# Patient Record
Sex: Female | Born: 1950 | Race: White | Hispanic: No | Marital: Single | Smoking: Never smoker
Health system: Southern US, Community
[De-identification: ages and names within clinical notes are randomized; demographics above are authoritative.]

## PROBLEM LIST (undated history)

## (undated) DIAGNOSIS — G5600 Carpal tunnel syndrome, unspecified upper limb: Secondary | ICD-10-CM

## (undated) DIAGNOSIS — K5792 Diverticulitis of intestine, part unspecified, without perforation or abscess without bleeding: Secondary | ICD-10-CM

## (undated) DIAGNOSIS — M1990 Unspecified osteoarthritis, unspecified site: Secondary | ICD-10-CM

## (undated) DIAGNOSIS — I10 Essential (primary) hypertension: Secondary | ICD-10-CM

## (undated) DIAGNOSIS — Z8481 Family history of carrier of genetic disease: Secondary | ICD-10-CM

## (undated) DIAGNOSIS — I82419 Acute embolism and thrombosis of unspecified femoral vein: Secondary | ICD-10-CM

## (undated) DIAGNOSIS — G4733 Obstructive sleep apnea (adult) (pediatric): Secondary | ICD-10-CM

## (undated) DIAGNOSIS — M8580 Other specified disorders of bone density and structure, unspecified site: Secondary | ICD-10-CM

## (undated) DIAGNOSIS — C801 Malignant (primary) neoplasm, unspecified: Secondary | ICD-10-CM

## (undated) DIAGNOSIS — Z803 Family history of malignant neoplasm of breast: Secondary | ICD-10-CM

## (undated) DIAGNOSIS — D649 Anemia, unspecified: Secondary | ICD-10-CM

## (undated) DIAGNOSIS — C569 Malignant neoplasm of unspecified ovary: Secondary | ICD-10-CM

## (undated) DIAGNOSIS — H409 Unspecified glaucoma: Secondary | ICD-10-CM

## (undated) DIAGNOSIS — G4730 Sleep apnea, unspecified: Secondary | ICD-10-CM

## (undated) DIAGNOSIS — M7072 Other bursitis of hip, left hip: Secondary | ICD-10-CM

## (undated) DIAGNOSIS — Z1379 Encounter for other screening for genetic and chromosomal anomalies: Secondary | ICD-10-CM

## (undated) DIAGNOSIS — M160 Bilateral primary osteoarthritis of hip: Secondary | ICD-10-CM

## (undated) DIAGNOSIS — J40 Bronchitis, not specified as acute or chronic: Secondary | ICD-10-CM

## (undated) DIAGNOSIS — J189 Pneumonia, unspecified organism: Secondary | ICD-10-CM

## (undated) DIAGNOSIS — O2230 Deep phlebothrombosis in pregnancy, unspecified trimester: Secondary | ICD-10-CM

## (undated) DIAGNOSIS — Z8543 Personal history of malignant neoplasm of ovary: Secondary | ICD-10-CM

## (undated) DIAGNOSIS — B029 Zoster without complications: Secondary | ICD-10-CM

## (undated) DIAGNOSIS — E785 Hyperlipidemia, unspecified: Secondary | ICD-10-CM

## (undated) DIAGNOSIS — H269 Unspecified cataract: Secondary | ICD-10-CM

## (undated) DIAGNOSIS — C44311 Basal cell carcinoma of skin of nose: Secondary | ICD-10-CM

## (undated) HISTORY — PX: OTHER SURGICAL HISTORY: SHX169

## (undated) HISTORY — DX: Sleep apnea, unspecified: G47.30

## (undated) HISTORY — DX: Unspecified glaucoma: H40.9

## (undated) HISTORY — DX: Malignant (primary) neoplasm, unspecified: C80.1

## (undated) HISTORY — PX: EYE SURGERY: SHX253

## (undated) HISTORY — DX: Family history of carrier of genetic disease: Z84.81

## (undated) HISTORY — PX: BASAL CELL CARCINOMA EXCISION: SHX1214

## (undated) HISTORY — DX: Zoster without complications: B02.9

## (undated) HISTORY — DX: Other specified disorders of bone density and structure, unspecified site: M85.80

## (undated) HISTORY — DX: Unspecified cataract: H26.9

## (undated) HISTORY — PX: CATARACT EXTRACTION: SUR2

## (undated) HISTORY — DX: Personal history of malignant neoplasm of ovary: Z85.43

## (undated) HISTORY — DX: Acute embolism and thrombosis of unspecified femoral vein: I82.419

## (undated) HISTORY — PX: ABDOMINAL HYSTERECTOMY: SHX81

## (undated) HISTORY — DX: Basal cell carcinoma of skin of nose: C44.311

## (undated) HISTORY — DX: Family history of malignant neoplasm of breast: Z80.3

## (undated) HISTORY — DX: Essential (primary) hypertension: I10

## (undated) HISTORY — DX: Deep phlebothrombosis in pregnancy, unspecified trimester: O22.30

## (undated) HISTORY — DX: Anemia, unspecified: D64.9

## (undated) HISTORY — DX: Bilateral primary osteoarthritis of hip: M16.0

## (undated) HISTORY — DX: Carpal tunnel syndrome, unspecified upper limb: G56.00

## (undated) HISTORY — DX: Encounter for other screening for genetic and chromosomal anomalies: Z13.79

## (undated) HISTORY — DX: Obstructive sleep apnea (adult) (pediatric): G47.33

## (undated) HISTORY — DX: Malignant neoplasm of unspecified ovary: C56.9

## (undated) HISTORY — DX: Unspecified osteoarthritis, unspecified site: M19.90

## (undated) HISTORY — DX: Bronchitis, not specified as acute or chronic: J40

## (undated) HISTORY — DX: Hyperlipidemia, unspecified: E78.5

## (undated) HISTORY — DX: Other bursitis of hip, left hip: M70.72

---

## 1968-01-12 HISTORY — PX: OTHER SURGICAL HISTORY: SHX169

## 1968-01-12 HISTORY — PX: BREAST LUMPECTOMY: SHX2

## 2000-05-19 ENCOUNTER — Ambulatory Visit (HOSPITAL_COMMUNITY): Admission: RE | Admit: 2000-05-19 | Discharge: 2000-05-19 | Payer: Self-pay | Admitting: Gastroenterology

## 2000-05-19 ENCOUNTER — Encounter (INDEPENDENT_AMBULATORY_CARE_PROVIDER_SITE_OTHER): Payer: Self-pay | Admitting: Specialist

## 2003-01-12 DIAGNOSIS — C569 Malignant neoplasm of unspecified ovary: Secondary | ICD-10-CM

## 2003-01-12 HISTORY — DX: Malignant neoplasm of unspecified ovary: C56.9

## 2003-01-12 HISTORY — PX: TOTAL ABDOMINAL HYSTERECTOMY W/ BILATERAL SALPINGOOPHORECTOMY: SHX83

## 2003-05-10 ENCOUNTER — Encounter (INDEPENDENT_AMBULATORY_CARE_PROVIDER_SITE_OTHER): Payer: Self-pay | Admitting: Specialist

## 2003-05-10 ENCOUNTER — Ambulatory Visit (HOSPITAL_COMMUNITY): Admission: RE | Admit: 2003-05-10 | Discharge: 2003-05-10 | Payer: Self-pay | Admitting: Gastroenterology

## 2003-05-13 ENCOUNTER — Encounter (INDEPENDENT_AMBULATORY_CARE_PROVIDER_SITE_OTHER): Payer: Self-pay | Admitting: *Deleted

## 2003-05-13 ENCOUNTER — Inpatient Hospital Stay (HOSPITAL_COMMUNITY): Admission: RE | Admit: 2003-05-13 | Discharge: 2003-05-17 | Payer: Self-pay | Admitting: *Deleted

## 2003-05-13 ENCOUNTER — Encounter (INDEPENDENT_AMBULATORY_CARE_PROVIDER_SITE_OTHER): Payer: Self-pay | Admitting: Specialist

## 2003-06-12 ENCOUNTER — Ambulatory Visit (HOSPITAL_COMMUNITY): Admission: RE | Admit: 2003-06-12 | Discharge: 2003-06-12 | Payer: Self-pay | Admitting: Oncology

## 2003-12-25 ENCOUNTER — Encounter: Admission: RE | Admit: 2003-12-25 | Discharge: 2003-12-25 | Payer: Self-pay | Admitting: Oncology

## 2004-01-10 ENCOUNTER — Encounter: Admission: RE | Admit: 2004-01-10 | Discharge: 2004-01-10 | Payer: Self-pay | Admitting: Oncology

## 2004-03-17 ENCOUNTER — Ambulatory Visit: Payer: Self-pay | Admitting: Oncology

## 2004-06-23 ENCOUNTER — Ambulatory Visit (HOSPITAL_COMMUNITY): Admission: RE | Admit: 2004-06-23 | Discharge: 2004-06-23 | Payer: Self-pay | Admitting: Oncology

## 2004-09-29 ENCOUNTER — Ambulatory Visit: Payer: Self-pay | Admitting: Oncology

## 2004-12-25 ENCOUNTER — Encounter: Admission: RE | Admit: 2004-12-25 | Discharge: 2004-12-25 | Payer: Self-pay | Admitting: *Deleted

## 2005-03-17 ENCOUNTER — Encounter: Admission: RE | Admit: 2005-03-17 | Discharge: 2005-03-17 | Payer: Self-pay | Admitting: *Deleted

## 2005-03-20 ENCOUNTER — Ambulatory Visit (HOSPITAL_COMMUNITY): Admission: RE | Admit: 2005-03-20 | Discharge: 2005-03-20 | Payer: Self-pay | Admitting: *Deleted

## 2005-03-30 ENCOUNTER — Ambulatory Visit: Payer: Self-pay | Admitting: Oncology

## 2005-07-26 ENCOUNTER — Ambulatory Visit (HOSPITAL_COMMUNITY): Admission: RE | Admit: 2005-07-26 | Discharge: 2005-07-26 | Payer: Self-pay

## 2005-10-14 ENCOUNTER — Ambulatory Visit: Payer: Self-pay | Admitting: Oncology

## 2006-01-07 ENCOUNTER — Ambulatory Visit: Payer: Self-pay | Admitting: Oncology

## 2006-01-07 LAB — CA 125: CA 125: 6.4 U/mL (ref 0.0–30.2)

## 2006-01-11 DIAGNOSIS — I82419 Acute embolism and thrombosis of unspecified femoral vein: Secondary | ICD-10-CM

## 2006-01-11 HISTORY — PX: HERNIA REPAIR: SHX51

## 2006-01-11 HISTORY — DX: Acute embolism and thrombosis of unspecified femoral vein: I82.419

## 2006-01-12 ENCOUNTER — Ambulatory Visit: Admission: RE | Admit: 2006-01-12 | Discharge: 2006-01-12 | Payer: Self-pay | Admitting: Gynecologic Oncology

## 2006-04-11 ENCOUNTER — Ambulatory Visit: Payer: Self-pay | Admitting: Oncology

## 2006-04-13 LAB — CBC WITH DIFFERENTIAL/PLATELET
Basophils Absolute: 0 10*3/uL (ref 0.0–0.1)
EOS%: 1.6 % (ref 0.0–7.0)
Eosinophils Absolute: 0.1 10*3/uL (ref 0.0–0.5)
HCT: 34.9 % (ref 34.8–46.6)
HGB: 12.2 g/dL (ref 11.6–15.9)
MCH: 29.6 pg (ref 26.0–34.0)
MONO#: 0.5 10*3/uL (ref 0.1–0.9)
NEUT#: 2.9 10*3/uL (ref 1.5–6.5)
NEUT%: 64.3 % (ref 39.6–76.8)
RDW: 13.9 % (ref 11.3–14.5)
lymph#: 1 10*3/uL (ref 0.9–3.3)

## 2006-04-13 LAB — COMPREHENSIVE METABOLIC PANEL
AST: 24 U/L (ref 0–37)
Albumin: 4.6 g/dL (ref 3.5–5.2)
BUN: 24 mg/dL — ABNORMAL HIGH (ref 6–23)
CO2: 27 mEq/L (ref 19–32)
Calcium: 9.3 mg/dL (ref 8.4–10.5)
Chloride: 102 mEq/L (ref 96–112)
Creatinine, Ser: 1.02 mg/dL (ref 0.40–1.20)
Glucose, Bld: 92 mg/dL (ref 70–99)
Potassium: 4 mEq/L (ref 3.5–5.3)

## 2006-04-13 LAB — LIPID PANEL
Cholesterol: 198 mg/dL (ref 0–200)
HDL: 45 mg/dL (ref >39)
LDL Cholesterol: 101 mg/dL — ABNORMAL HIGH (ref 0–99)
Triglycerides: 260 mg/dL — ABNORMAL HIGH (ref <150)

## 2006-04-13 LAB — CA 125: CA 125: 5.3 U/mL (ref 0.0–30.2)

## 2006-04-22 ENCOUNTER — Encounter: Admission: RE | Admit: 2006-04-22 | Discharge: 2006-04-22 | Payer: Self-pay | Admitting: Oncology

## 2006-10-19 ENCOUNTER — Ambulatory Visit: Payer: Self-pay | Admitting: Oncology

## 2006-10-26 ENCOUNTER — Ambulatory Visit: Admission: RE | Admit: 2006-10-26 | Discharge: 2006-10-26 | Payer: Self-pay | Admitting: Gynecologic Oncology

## 2006-12-19 ENCOUNTER — Inpatient Hospital Stay (HOSPITAL_COMMUNITY): Admission: EM | Admit: 2006-12-19 | Discharge: 2006-12-22 | Payer: Self-pay | Admitting: Emergency Medicine

## 2006-12-19 ENCOUNTER — Encounter: Payer: Self-pay | Admitting: Internal Medicine

## 2006-12-19 ENCOUNTER — Ambulatory Visit: Payer: Self-pay | Admitting: Surgery

## 2006-12-19 ENCOUNTER — Ambulatory Visit: Payer: Self-pay | Admitting: Family Medicine

## 2007-01-01 ENCOUNTER — Encounter: Payer: Self-pay | Admitting: Family Medicine

## 2007-01-01 ENCOUNTER — Ambulatory Visit (HOSPITAL_COMMUNITY): Admission: RE | Admit: 2007-01-01 | Discharge: 2007-01-01 | Payer: Self-pay | Admitting: Family Medicine

## 2007-01-17 ENCOUNTER — Ambulatory Visit: Payer: Self-pay | Admitting: Vascular Surgery

## 2007-01-25 ENCOUNTER — Ambulatory Visit: Payer: Self-pay | Admitting: Cardiovascular Disease

## 2007-02-01 ENCOUNTER — Ambulatory Visit: Payer: Self-pay | Admitting: Cardiovascular Disease

## 2007-02-07 ENCOUNTER — Ambulatory Visit: Payer: Self-pay | Admitting: Cardiovascular Disease

## 2007-02-24 ENCOUNTER — Ambulatory Visit: Payer: Self-pay | Admitting: Internal Medicine

## 2007-03-10 ENCOUNTER — Ambulatory Visit: Payer: Self-pay | Admitting: Internal Medicine

## 2007-03-24 ENCOUNTER — Ambulatory Visit: Payer: Self-pay | Admitting: Cardiovascular Disease

## 2007-03-24 ENCOUNTER — Ambulatory Visit: Payer: Self-pay

## 2007-03-24 ENCOUNTER — Ambulatory Visit: Payer: Self-pay | Admitting: Internal Medicine

## 2007-04-03 ENCOUNTER — Ambulatory Visit: Payer: Self-pay | Admitting: Cardiology

## 2007-04-17 ENCOUNTER — Ambulatory Visit: Payer: Self-pay | Admitting: Cardiovascular Disease

## 2007-04-18 ENCOUNTER — Ambulatory Visit: Payer: Self-pay | Admitting: Oncology

## 2007-04-20 LAB — CBC WITH DIFFERENTIAL/PLATELET
BASO%: 1 % (ref 0.0–2.0)
HCT: 35.1 % (ref 34.8–46.6)
LYMPH%: 30.5 % (ref 14.0–48.0)
MCHC: 34.8 g/dL (ref 32.0–36.0)
MCV: 81.8 fL (ref 81.0–101.0)
MONO%: 10 % (ref 0.0–13.0)
NEUT%: 56.5 % (ref 39.6–76.8)
Platelets: 206 10*3/uL (ref 145–400)
RBC: 4.29 10*6/uL (ref 3.70–5.32)

## 2007-04-20 LAB — COMPREHENSIVE METABOLIC PANEL
ALT: 17 U/L (ref 0–35)
AST: 22 U/L (ref 0–37)
Albumin: 4.4 g/dL (ref 3.5–5.2)
Alkaline Phosphatase: 78 U/L (ref 39–117)
BUN: 28 mg/dL — ABNORMAL HIGH (ref 6–23)
CO2: 25 mEq/L (ref 19–32)
Calcium: 9.6 mg/dL (ref 8.4–10.5)
Chloride: 104 mEq/L (ref 96–112)
Creatinine, Ser: 1.02 mg/dL (ref 0.40–1.20)
Glucose, Bld: 93 mg/dL (ref 70–99)
Potassium: 3.9 mEq/L (ref 3.5–5.3)
Sodium: 139 mEq/L (ref 135–145)
Total Bilirubin: 0.4 mg/dL (ref 0.3–1.2)
Total Protein: 7 g/dL (ref 6.0–8.3)

## 2007-04-20 LAB — CA 125: CA 125: 7.4 U/mL (ref 0.0–30.2)

## 2007-05-01 ENCOUNTER — Ambulatory Visit: Payer: Self-pay | Admitting: Cardiology

## 2007-05-11 ENCOUNTER — Encounter: Admission: RE | Admit: 2007-05-11 | Discharge: 2007-05-11 | Payer: Self-pay | Admitting: Oncology

## 2007-05-22 ENCOUNTER — Ambulatory Visit: Payer: Self-pay | Admitting: Cardiology

## 2007-06-16 ENCOUNTER — Ambulatory Visit: Payer: Self-pay

## 2007-06-16 ENCOUNTER — Ambulatory Visit: Payer: Self-pay | Admitting: Cardiovascular Disease

## 2007-06-30 ENCOUNTER — Ambulatory Visit: Payer: Self-pay | Admitting: Internal Medicine

## 2007-07-21 ENCOUNTER — Ambulatory Visit: Payer: Self-pay | Admitting: Cardiovascular Disease

## 2007-08-10 ENCOUNTER — Encounter: Admission: RE | Admit: 2007-08-10 | Discharge: 2007-08-10 | Payer: Self-pay | Admitting: Family Medicine

## 2007-08-15 ENCOUNTER — Ambulatory Visit: Payer: Self-pay | Admitting: Cardiology

## 2007-09-11 ENCOUNTER — Ambulatory Visit: Payer: Self-pay | Admitting: Cardiology

## 2007-09-25 ENCOUNTER — Ambulatory Visit: Payer: Self-pay | Admitting: Cardiovascular Disease

## 2007-10-12 ENCOUNTER — Ambulatory Visit: Payer: Self-pay | Admitting: Internal Medicine

## 2007-10-24 ENCOUNTER — Ambulatory Visit: Admission: RE | Admit: 2007-10-24 | Discharge: 2007-10-24 | Payer: Self-pay | Admitting: Gynecologic Oncology

## 2007-10-24 ENCOUNTER — Other Ambulatory Visit: Admission: RE | Admit: 2007-10-24 | Discharge: 2007-10-24 | Payer: Self-pay | Admitting: Gynecologic Oncology

## 2007-10-25 ENCOUNTER — Encounter: Payer: Self-pay | Admitting: Gynecologic Oncology

## 2007-11-09 ENCOUNTER — Ambulatory Visit: Payer: Self-pay | Admitting: Cardiovascular Disease

## 2007-12-14 ENCOUNTER — Ambulatory Visit: Payer: Self-pay | Admitting: Cardiology

## 2007-12-25 ENCOUNTER — Ambulatory Visit: Payer: Self-pay | Admitting: Cardiovascular Disease

## 2008-01-08 ENCOUNTER — Ambulatory Visit: Payer: Self-pay | Admitting: Cardiology

## 2008-01-29 ENCOUNTER — Ambulatory Visit: Payer: Self-pay | Admitting: Cardiology

## 2008-02-08 ENCOUNTER — Ambulatory Visit: Payer: Self-pay | Admitting: Internal Medicine

## 2008-02-08 ENCOUNTER — Ambulatory Visit: Payer: Self-pay | Admitting: Vascular Surgery

## 2008-02-08 ENCOUNTER — Ambulatory Visit (HOSPITAL_COMMUNITY): Admission: RE | Admit: 2008-02-08 | Discharge: 2008-02-08 | Payer: Self-pay | Admitting: Family Medicine

## 2008-02-08 ENCOUNTER — Encounter: Payer: Self-pay | Admitting: Family Medicine

## 2008-02-26 ENCOUNTER — Ambulatory Visit: Payer: Self-pay | Admitting: Internal Medicine

## 2008-03-12 ENCOUNTER — Ambulatory Visit: Payer: Self-pay | Admitting: Internal Medicine

## 2008-03-19 ENCOUNTER — Ambulatory Visit: Payer: Self-pay | Admitting: Cardiology

## 2008-03-25 DIAGNOSIS — C569 Malignant neoplasm of unspecified ovary: Secondary | ICD-10-CM | POA: Insufficient documentation

## 2008-03-25 DIAGNOSIS — I10 Essential (primary) hypertension: Secondary | ICD-10-CM | POA: Insufficient documentation

## 2008-03-26 ENCOUNTER — Ambulatory Visit: Payer: Self-pay | Admitting: Internal Medicine

## 2008-03-26 ENCOUNTER — Ambulatory Visit: Payer: Self-pay | Admitting: Cardiovascular Disease

## 2008-03-26 DIAGNOSIS — R609 Edema, unspecified: Secondary | ICD-10-CM | POA: Insufficient documentation

## 2008-03-26 DIAGNOSIS — I82409 Acute embolism and thrombosis of unspecified deep veins of unspecified lower extremity: Secondary | ICD-10-CM | POA: Insufficient documentation

## 2008-04-05 ENCOUNTER — Ambulatory Visit: Payer: Self-pay | Admitting: Cardiology

## 2008-04-16 ENCOUNTER — Ambulatory Visit: Payer: Self-pay | Admitting: Cardiovascular Disease

## 2008-04-29 ENCOUNTER — Ambulatory Visit: Payer: Self-pay | Admitting: Oncology

## 2008-05-01 LAB — CBC WITH DIFFERENTIAL/PLATELET
BASO%: 0.4 % (ref 0.0–2.0)
EOS%: 2.2 % (ref 0.0–7.0)
Eosinophils Absolute: 0.1 10*3/uL (ref 0.0–0.5)
MCH: 29.8 pg (ref 25.1–34.0)
MCHC: 34.4 g/dL (ref 31.5–36.0)
MCV: 86.6 fL (ref 79.5–101.0)
MONO%: 16.3 % — ABNORMAL HIGH (ref 0.0–14.0)
NEUT#: 1.6 10*3/uL (ref 1.5–6.5)
RBC: 4.5 10*6/uL (ref 3.70–5.45)
RDW: 14.5 % (ref 11.2–14.5)

## 2008-05-01 LAB — COMPREHENSIVE METABOLIC PANEL
ALT: 28 U/L (ref 0–35)
AST: 33 U/L (ref 0–37)
Albumin: 4.1 g/dL (ref 3.5–5.2)
Alkaline Phosphatase: 78 U/L (ref 39–117)
Potassium: 3.5 mEq/L (ref 3.5–5.3)
Sodium: 138 mEq/L (ref 135–145)
Total Protein: 7.4 g/dL (ref 6.0–8.3)

## 2008-05-03 ENCOUNTER — Ambulatory Visit: Payer: Self-pay | Admitting: Cardiology

## 2008-05-07 ENCOUNTER — Encounter: Payer: Self-pay | Admitting: Cardiovascular Disease

## 2008-05-20 ENCOUNTER — Ambulatory Visit: Payer: Self-pay | Admitting: Cardiovascular Disease

## 2008-06-11 ENCOUNTER — Encounter: Payer: Self-pay | Admitting: *Deleted

## 2008-06-20 ENCOUNTER — Ambulatory Visit: Payer: Self-pay | Admitting: Cardiology

## 2008-07-17 ENCOUNTER — Encounter: Payer: Self-pay | Admitting: *Deleted

## 2008-07-18 ENCOUNTER — Ambulatory Visit: Payer: Self-pay | Admitting: Cardiology

## 2008-07-18 LAB — CONVERTED CEMR LAB
POC INR: 3
Prothrombin Time: 20.9 s

## 2008-07-19 ENCOUNTER — Encounter: Payer: Self-pay | Admitting: Cardiovascular Disease

## 2008-08-19 ENCOUNTER — Ambulatory Visit: Payer: Self-pay | Admitting: Cardiology

## 2008-08-19 LAB — CONVERTED CEMR LAB
POC INR: 3.5
Prothrombin Time: 22.7 s

## 2008-08-22 ENCOUNTER — Encounter: Admission: RE | Admit: 2008-08-22 | Discharge: 2008-08-22 | Payer: Self-pay | Admitting: Emergency Medicine

## 2008-09-13 ENCOUNTER — Encounter: Payer: Self-pay | Admitting: Cardiovascular Disease

## 2008-09-19 ENCOUNTER — Ambulatory Visit: Payer: Self-pay | Admitting: Cardiovascular Disease

## 2008-10-17 ENCOUNTER — Ambulatory Visit: Payer: Self-pay | Admitting: Internal Medicine

## 2008-10-17 LAB — CONVERTED CEMR LAB: POC INR: 3.1

## 2008-10-25 ENCOUNTER — Telehealth: Payer: Self-pay | Admitting: Cardiovascular Disease

## 2008-11-01 ENCOUNTER — Ambulatory Visit: Payer: Self-pay | Admitting: Cardiology

## 2008-11-04 ENCOUNTER — Telehealth: Payer: Self-pay | Admitting: Cardiovascular Disease

## 2008-11-11 ENCOUNTER — Ambulatory Visit: Payer: Self-pay | Admitting: Cardiology

## 2008-11-11 LAB — CONVERTED CEMR LAB: POC INR: 2.8

## 2008-12-16 ENCOUNTER — Ambulatory Visit: Payer: Self-pay | Admitting: Cardiology

## 2008-12-16 LAB — CONVERTED CEMR LAB: POC INR: 3

## 2009-01-16 ENCOUNTER — Ambulatory Visit: Payer: Self-pay | Admitting: Cardiovascular Disease

## 2009-01-27 ENCOUNTER — Telehealth: Payer: Self-pay | Admitting: Cardiology

## 2009-02-05 ENCOUNTER — Encounter (INDEPENDENT_AMBULATORY_CARE_PROVIDER_SITE_OTHER): Payer: Self-pay | Admitting: *Deleted

## 2009-02-10 ENCOUNTER — Ambulatory Visit: Payer: Self-pay | Admitting: Cardiology

## 2009-02-17 ENCOUNTER — Ambulatory Visit: Payer: Self-pay | Admitting: Cardiovascular Disease

## 2009-03-03 ENCOUNTER — Ambulatory Visit: Payer: Self-pay | Admitting: Internal Medicine

## 2009-03-03 LAB — CONVERTED CEMR LAB: POC INR: 1

## 2009-03-07 ENCOUNTER — Ambulatory Visit: Payer: Self-pay | Admitting: Cardiology

## 2009-03-07 LAB — CONVERTED CEMR LAB: POC INR: 1.3

## 2009-03-10 ENCOUNTER — Ambulatory Visit: Payer: Self-pay | Admitting: Cardiology

## 2009-03-31 ENCOUNTER — Ambulatory Visit: Payer: Self-pay | Admitting: Cardiology

## 2009-04-28 ENCOUNTER — Ambulatory Visit: Payer: Self-pay | Admitting: Cardiology

## 2009-04-28 LAB — CONVERTED CEMR LAB: POC INR: 2.7

## 2009-04-29 ENCOUNTER — Ambulatory Visit: Payer: Self-pay | Admitting: Oncology

## 2009-05-01 LAB — CBC WITH DIFFERENTIAL/PLATELET
BASO%: 0.2 % (ref 0.0–2.0)
EOS%: 1.8 % (ref 0.0–7.0)
MONO#: 0.5 10*3/uL (ref 0.1–0.9)
MONO%: 11.6 % (ref 0.0–14.0)
lymph#: 1.1 10*3/uL (ref 0.9–3.3)

## 2009-05-01 LAB — COMPREHENSIVE METABOLIC PANEL
ALT: 35 U/L (ref 0–35)
AST: 37 U/L (ref 0–37)
CO2: 26 mEq/L (ref 19–32)
Calcium: 9.3 mg/dL (ref 8.4–10.5)
Chloride: 102 mEq/L (ref 96–112)
Creatinine, Ser: 0.82 mg/dL (ref 0.40–1.20)
Glucose, Bld: 128 mg/dL — ABNORMAL HIGH (ref 70–99)
Potassium: 3.9 mEq/L (ref 3.5–5.3)
Total Bilirubin: 0.7 mg/dL (ref 0.3–1.2)
Total Protein: 7.1 g/dL (ref 6.0–8.3)

## 2009-05-02 LAB — CA 125: CA 125: 6.3 U/mL (ref 0.0–30.2)

## 2009-05-02 LAB — VITAMIN D 25 HYDROXY (VIT D DEFICIENCY, FRACTURES): Vit D, 25-Hydroxy: 30 ng/mL (ref 30–89)

## 2009-05-19 ENCOUNTER — Encounter: Payer: Self-pay | Admitting: Cardiovascular Disease

## 2009-05-23 ENCOUNTER — Encounter: Admission: RE | Admit: 2009-05-23 | Discharge: 2009-05-23 | Payer: Self-pay | Admitting: Oncology

## 2009-05-26 ENCOUNTER — Ambulatory Visit: Payer: Self-pay | Admitting: Cardiovascular Disease

## 2009-05-26 LAB — CONVERTED CEMR LAB: POC INR: 3

## 2009-06-02 ENCOUNTER — Ambulatory Visit: Payer: Self-pay | Admitting: Cardiovascular Disease

## 2009-06-02 DIAGNOSIS — R9431 Abnormal electrocardiogram [ECG] [EKG]: Secondary | ICD-10-CM | POA: Insufficient documentation

## 2009-06-26 ENCOUNTER — Ambulatory Visit: Payer: Self-pay | Admitting: Cardiology

## 2009-08-05 ENCOUNTER — Ambulatory Visit: Payer: Self-pay | Admitting: Cardiology

## 2009-08-26 ENCOUNTER — Encounter: Admission: RE | Admit: 2009-08-26 | Discharge: 2009-08-26 | Payer: Self-pay | Admitting: Obstetrics & Gynecology

## 2009-09-01 ENCOUNTER — Ambulatory Visit: Payer: Self-pay | Admitting: Cardiology

## 2009-09-01 LAB — CONVERTED CEMR LAB: POC INR: 3.9

## 2009-09-22 ENCOUNTER — Ambulatory Visit: Payer: Self-pay | Admitting: Internal Medicine

## 2009-10-20 ENCOUNTER — Ambulatory Visit: Payer: Self-pay | Admitting: Cardiology

## 2009-10-20 LAB — CONVERTED CEMR LAB: POC INR: 2.8

## 2009-11-17 ENCOUNTER — Ambulatory Visit: Payer: Self-pay | Admitting: Internal Medicine

## 2009-11-17 LAB — CONVERTED CEMR LAB: POC INR: 3

## 2009-12-15 ENCOUNTER — Ambulatory Visit: Payer: Self-pay | Admitting: Internal Medicine

## 2010-01-06 ENCOUNTER — Ambulatory Visit: Payer: Self-pay | Admitting: Internal Medicine

## 2010-01-31 ENCOUNTER — Encounter: Payer: Self-pay | Admitting: Oncology

## 2010-02-01 ENCOUNTER — Encounter: Payer: Self-pay | Admitting: Oncology

## 2010-02-02 ENCOUNTER — Ambulatory Visit: Admission: RE | Admit: 2010-02-02 | Discharge: 2010-02-02 | Payer: Self-pay | Source: Home / Self Care

## 2010-02-10 NOTE — Medication Information (Signed)
Summary: rov/jm  Anticoagulant Therapy  Managed by: Cloyde Reams, RN, BSN PCP: Dr. 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 no    Recent/future dental: no  Any missed doses?: no       Is patient compliant with meds? yes       Allergies: 1)  ! * Benoxinate 2)  ! Sulfa  Anticoagulation Management History:      The patient is taking warfarin and comes in today for a routine follow up visit.  Negative risk factors for bleeding include an age less than 69 years old.  The bleeding index is 'low risk'.  Positive CHADS2 values include History of HTN.  Negative CHADS2 values include Age > 55 years old.  The start date was 01/12/2007.  Anticoagulation responsible Caison Hearn: Antoine Poche MD, Fayrene Fearing.  INR POC: 2.8.  Cuvette Lot#: [id number].  Exp: 11/2010.    Anticoagulation Management Assessment/Plan:      The patient's current anticoagulation dose is Warfarin sodium 5 mg tabs: Use as directed by Anticoagulation Clinic.  The target INR is 2 - 3.  The next INR is due 11/17/2009.  Anticoagulation instructions were given to patient.  Results were reviewed/authorized by Cloyde Reams, RN, BSN.  She was notified by Cloyde Reams RN.         Prior Anticoagulation Instructions: INR 2.4  Continue taking two tablets every day except for one and one-half tablets on Monday, Wednesday, and Friday.  We will see you in four weeks.    Current Anticoagulation Instructions: INR 2.8  Continue on same dosage 2 tablets daily except 1.5 tablets on Mondays, Wednesdays, and Fridays.  Recheck in 4 weeks.

## 2010-02-10 NOTE — Medication Information (Signed)
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 no    Recent/future dental: no  Any missed doses?: no       Is patient compliant with meds? yes       Allergies: 1)  ! * Benoxinate 2)  ! Sulfa  Anticoagulation Management History:      The patient is taking warfarin and comes in today for a routine follow up visit.  Negative risk factors for bleeding include an age less than 34 years old.  The bleeding index is 'low risk'.  Positive CHADS2 values include History of HTN.  Negative CHADS2 values include Age > 10 years old.  The start date was 01/12/2007.  Anticoagulation responsible provider: Excell Seltzer MD, Casimiro Needle.  INR POC: 4.4.  Cuvette Lot#: [id number].  Exp: 10/2009.    Anticoagulation Management Assessment/Plan:      The patient's current anticoagulation dose is Coumadin 5 mg solr: Take 1 tablet by mouth as directed.  The target INR is 2 - 3.  The next INR is due 01/15/2009.  Anticoagulation instructions were given to patient.  Results were reviewed/authorized by Leota Sauers, Pharm D .         Prior Anticoagulation Instructions: INR 3.0  Take 5mg  today then resume same dosage 10mg  daily except 7.5mg  on Mondays and Thursdays.  Recheck in 4 weeks.    Current Anticoagulation Instructions: INR 4.4 Omit today's dose then resume to regular dose of 10mg  daily except  7.5mg  on Mondays and Thursdays.

## 2010-02-10 NOTE — Progress Notes (Signed)
Summary: upcoming dds procedure--bridging required..  Phone Note From Other Clinic   Caller: Dr. Isabelle Course Summary of Call: Dental surgery required.  Will need INR 1.0--may patient come off therapy. Initial call taken by: Shelby Dubin PharmD, BCPS, CPP,  January 27, 2009 4:51 PM  Follow-up for Phone Call        Per review of history, pt required lovenox for colon in 10/2008.  DDS will notify pt of date to schedule and instruct pt to call us for scheduling.   Follow-up by: Shelby Dubin PharmD, BCPS, CPP,  January 27, 2009 4:55 PM

## 2010-02-10 NOTE — Medication Information (Signed)
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 no    Recent/future dental: no  Any missed doses?: no       Is patient compliant with meds? yes      Comments: Continues on Lovenox.    Allergies: 1)  ! * Benoxinate 2)  ! Sulfa  Anticoagulation Management History:      The patient is taking warfarin and comes in today for a routine follow up visit.  Negative risk factors for bleeding include an age less than 72 years old.  The bleeding index is 'low risk'.  Positive CHADS2 values include History of HTN.  Negative CHADS2 values include Age > 31 years old.  The start date was 01/12/2007.  Anticoagulation responsible provider: Jens Som MD, Arlys John.  INR POC: 1.3.  Cuvette Lot#: [id number].  Exp: 04/2010.    Anticoagulation Management Assessment/Plan:      The patient's current anticoagulation dose is Coumadin 5 mg solr: Take 1 tablet by mouth as directed.  The target INR is 2 - 3.  The next INR is due 03/10/2009.  Anticoagulation instructions were given to patient.  Results were reviewed/authorized by Cloyde Reams, RN, BSN.  She was notified by Cloyde Reams RN.         Prior Anticoagulation Instructions: Continue with Lovenox 135mg  - 1 injection tonight, and 1 injection 12 hours later in the AM.  No warfarin at this time. Mary P. will contact you in the AM for further instructions.  Current Anticoagulation Instructions: INR 1.3  Take 3 tablets today and 2.5 tablets tomorrow then resume same dosage 2 tablets daily except 1.5 tablets on Mondays and Thursdays.  Continue on Lovenox two times a day.  Recheck on Monday.

## 2010-02-10 NOTE — Medication Information (Signed)
Summary: rov/cb  Anticoagulant Therapy  Managed by: Elaina Pattee, PharmD PCP: Dr. 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 no    Recent/future dental: no  Any missed doses?: yes     Details: May have taken 1.5 tablet dose on Friday 06/13/09.  Is patient compliant with meds? yes      Comments: Per Dr. Eden Emms, pt may have INR checked every 6-8 weeks.  After a several visits at 6 week intervals are stable, may consider every 8 weeks.  Counseled on calling in for any bleeding and med or diet changes.  Allergies: 1)  ! * Benoxinate 2)  ! Sulfa  Anticoagulation Management History:      The patient is taking warfarin and comes in today for a routine follow up visit.  Negative risk factors for bleeding include an age less than 50 years old.  The bleeding index is 'low risk'.  Positive CHADS2 values include History of HTN.  Negative CHADS2 values include Age > 60 years old.  The start date was 01/12/2007.  Anticoagulation responsible provider: Myrtis Ser MD, Tinnie Gens.  INR POC: 2.8.  Cuvette Lot#: [id number].  Exp: 08/2010.    Anticoagulation Management Assessment/Plan:      The patient's current anticoagulation dose is Warfarin sodium 5 mg tabs: Use as directed by Anticoagulation Clinic.  The target INR is 2 - 3.  The next INR is due 07/31/2009.  Anticoagulation instructions were given to patient.  Results were reviewed/authorized by Elaina Pattee, PharmD.  She was notified by Elaina Pattee, PharmD.         Prior Anticoagulation Instructions: INR 3  The patient is to continue with the same dose of coumadin.  This dosage includes: 2 tablets daily, except 1.5 tablets Mondays and Thursdays.    Next INR on June 13th at 4:15 pm.   Current Anticoagulation Instructions: INR 2.8. Take 2 tablets daily except 1.5 tablets on Mon and Thurs. Recheck in 5 weeks due to vacation the following week.

## 2010-02-10 NOTE — Medication Information (Signed)
Summary: rov/sp  Anticoagulant Therapy  Managed by: Rolland Porter, PharmD PCP: Dr. 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 no    Recent/future dental: no  Any missed doses?: no       Is patient compliant with meds? yes       Allergies: 1)  ! * Benoxinate 2)  ! Sulfa  Anticoagulation Management History:      The patient is taking warfarin and comes in today for a routine follow up visit.  Negative risk factors for bleeding include an age less than 41 years old.  The bleeding index is 'low risk'.  Positive CHADS2 values include History of HTN.  Negative CHADS2 values include Age > 41 years old.  The start date was 01/12/2007.  Anticoagulation responsible provider: Tenny Craw MD, Gunnar Fusi.  INR POC: 2.4.  Cuvette Lot#: [id number].  Exp: 10/2010.    Anticoagulation Management Assessment/Plan:      The patient's current anticoagulation dose is Warfarin sodium 5 mg tabs: Use as directed by Anticoagulation Clinic.  The target INR is 2 - 3.  The next INR is due 10/20/2009.  Anticoagulation instructions were given to patient.  Results were reviewed/authorized by Rolland Porter, PharmD.  She was notified by Kennieth Francois.         Prior Anticoagulation Instructions: INR 3.9  Skip today's dose, then begin 2 tablets daily except 1.5 tablets Mon, Wed and Fri.  Return to clinic.    Current Anticoagulation Instructions: INR 2.4  Continue taking two tablets every day except for one and one-half tablets on Monday, Wednesday, and Friday.  We will see you in four weeks.

## 2010-02-10 NOTE — Medication Information (Signed)
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 18  Any changes in medication regimen? no    Recent/future dental: no  Any missed doses?: no       Is patient compliant with meds? yes      Comments: PREVIOUSLY INSTRUCTIONS:  Lovenox 135 mg Subcutaneously one time on Thursday morning.  Resume coumadin on Friday evening continue through Sunday. Take 2 tabs on Friday, 3 tabs on Saturday and 3 tabs on Sunday. Have INR checked on Monday, February 7th.   Current Medications (verified): 1)  Coumadin 5 Mg Solr (Warfarin Sodium) .... Take 1 Tablet By Mouth As Directed 2)  Lisinopril-Hydrochlorothiazide 20-25 Mg Tabs (Lisinopril-Hydrochlorothiazide) .... Take 1 Tablet By Mouth Once A Day 3)  Simvastatin 80 Mg Tabs (Simvastatin) .... Take 1 Tablet By Mouth At Bedtime 4)  Glucosamine-Chondroitin  Caps (Glucosamine-Chondroit-Vit C-Mn) .Marland Kitchen.. 1 Tablet Bid 5)  Calcium-D 600-200 Mg-Unit Caps (Calcium Carbonate-Vitamin D) .... Take One Tablet By Mouth Two Times A Day 6)  Vitamin E 400 Unit Caps (Vitamin E) .... One Tablet By Mouth Daily 7)  Multivitamins   Tabs (Multiple Vitamin) .... One Tablet Daily 8)  Vitamin D (Ergocalciferol) 50000 Unit Caps (Ergocalciferol) .... Uad 9)  Fluticasone Propionate  Nasal Spray .... Uad As Needed 10)  Ferrous Sulfate 325 (65 Fe) Mg Tabs (Ferrous Sulfate) .... One Tablet Daily 11)  Fish Oil   Oil (Fish Oil) .... One Tablet Daily 12)  Niacin 500 Mg Tabs (Niacin) .... One Tablet Daily 13)  Vitamin B-3 .... One Tablet Daily 14)  Furosemide 20 Mg Tabs  (Furosemide) .... Prn 15)  Metrolotion 0.75 % Lotn (Metronidazole) .... Prn 16)  Lovenox 120 Mg/0.59ml Soln (Enoxaparin Sodium) .... Uad 17)  Triamcinolone Acetonide 0.1 % Crea (Triamcinolone Acetonide) .... Uad 18)  Cetirizine Hcl 10 Mg Tabs (Cetirizine Hcl) .Marland Kitchen.. 1 Tab Every Morning 19)  Tylenol Extra Strength 500 Mg Tabs (Acetaminophen) .... As Needed 20)  Unisom 25 Mg Tabs (Doxylamine Succinate (Sleep)) .... Prn 21)  Ultra Flora Ib .Marland Kitchen.. 1 Tablet Every Morning 22)  Lovenox 150 Mg/ml Soln (Enoxaparin Sodium) .... Inject 135 Mg Subcutaneously As Directed Two Times A Day.  Allergies (verified): 1)  ! * Benoxinate 2)  ! Sulfa  Anticoagulation Management History:      The patient is taking warfarin and comes in today for a routine follow up visit.  Negative risk factors for bleeding include an age less than 32 years old.  The bleeding index is 'low risk'.  Positive CHADS2 values include History of HTN.  Negative CHADS2 values include Age > 36 years old.  The start date was 01/12/2007.  Anticoagulation responsible provider: Excell Seltzer MD, Casimiro Needle.  INR POC: 1.9.  Cuvette Lot#: [id number].  Exp: 04/2010.    Anticoagulation Management Assessment/Plan:      The patient's current anticoagulation dose is Coumadin 5 mg solr: Take 1 tablet by mouth as directed.  The target INR is 2 - 3.  The next INR is due 03/03/2009.  Anticoagulation instructions were given to patient.  Results were reviewed/authorized  by Jeralene Peters, PharmD.         Prior Anticoagulation Instructions: INR 3.1  Lovenox 135 mg Subcutaneously one time on Thursday morning.  Resume coumadin on Friday evening continue through Sunday. Take 2 tabs on Friday, 3 tabs on Saturday and 3 tabs on Sunday. Have INR checked on Monday, February 7th.   Current Anticoagulation Instructions: INR 1.9  TAKE 2 TABLETS TONIGHT THEN CONTINUE TAKING 2 TABLETS EVERYDAY EXCEPT TAKE 1.5 ON MONDAYS AND THURSDAYS. TAKE LAST DOSE OF COUMADIN ON FEB 12.  TAKE NOTHING ON  FEB 13.  START LOVENOX ON THE NIGHT OF FEB 14 - INJECT 135 mg Subcutaneously ONCE ON FEB 14, TWICE A DAY ON FEB 15 AND 16, AND ONCE ON FEB 17.  ASK MD FOR FURTHER INSTRUCTIONS ABOUT LOVENOX AFTER COLONSCOPY.  NEXT APPOINTMENT IS FEB 21.

## 2010-02-10 NOTE — Assessment & Plan Note (Signed)
Summary: f1y/dm   Visit Type:  1 yr f/u Primary Provider:  Dr. Cleta Alberts..[facility]  CC:  edema/left leg....no other complaints today.  History of Present Illness: [name] is seen today for F/U of post-phlebitic syndrome, coumadin Rx and abnormal ECG.  She is an ovarian cancer survivor out 5 years now.  Her initial extensive DVT was in the setting of ovarian CA.  She has had multiple F/U duplex showing persistan clot and recanalizatoin.  With her weight we have elected to keep her on coumadin.  She has had stable Rx doses and I think it would be ok to let her INR be checked every 6-8 weeks.  We discussed Pradaxa but I don't like the idea that it is not reversable.  With her meds and compressoin hose her edema is stable.  She has had an abnromal ECG in the past with poor R wave progression but normal EF and I suspect it is from lead placement and body habitus.  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 Use As Directed By Anticoagulation Clinic 2)  Lisinopril-Hydrochlorothiazide 20-25 Mg Tabs (Lisinopril-Hydrochlorothiazide) .... Take 1 Tablet By Mouth Once A Day 3)  Simvastatin 80 Mg Tabs (Simvastatin) .... Take 1 Tablet By Mouth At Bedtime 4)  Glucosamine-Chondroitin  Caps (Glucosamine-Chondroit-Vit C-Mn) .Marland Kitchen.. 1 Tablet Bid 5)  Calcium-D 600-200 Mg-Unit Caps (Calcium Carbonate-Vitamin D) .... Take One Tablet By Mouth Two Times A Day 6)  Vitamin E 400 Unit Caps (Vitamin E) .... One Tablet By Mouth Daily 7)  Multivitamins   Tabs (Multiple Vitamin) .... One Tablet Daily 8)  Vitamin D (Ergocalciferol) 50000 Unit Caps (Ergocalciferol) .Marland Kitchen.. 1 Tab Weekly 9)  Fluticasone Propionate  Nasal Spray .... Uad As Needed 10)  Ferrous Sulfate 325 (65 Fe) Mg Tabs (Ferrous Sulfate) .... One Tablet  Daily 11)  Fish Oil   Oil (Fish Oil) .... One Tablet Daily 12)  Niacin 500 Mg Tabs (Niacin) .... One Tablet Daily 13)  Triamcinolone Acetonide 0.1 % Crea (Triamcinolone Acetonide) .... Uad 14)  Cetirizine Hcl 10 Mg Tabs (Cetirizine Hcl) .Marland Kitchen.. 1 Tab Every Morning 15)  Tylenol Extra Strength 500 Mg Tabs (Acetaminophen) .... As Needed  Allergies: 1)  ! * Benoxinate 2)  ! Sulfa  Past History:  Past Medical History: Last updated: 03/25/2008 hypertension hyperlipidemia  cancer of ovary Anemia of uncertain etiology History of basal cell carcinoma of the nose.  Problems with calcium absorption  Past Surgical History: Last updated: 03/25/2008 hysterectomy Hernia repair in 2007.  Basal cell carcinoma resection with plastic surgery reconstruction in summer of 200 Ovarian cancer surgery as above in 2005.  Bilateral cataract surgery Bilateral benign breast mass from the left breast in 1970.  Family History: Last updated: 03/25/2008 Both her parents had Alzheimer's and hypertension.  Her  father had colon cancer in his 80s.  She has sisters with benign GYN  disease and have undergone hysterectomy.  She has one niece who  developed breast cancer at the age of 3.  She had genetic testing that  showed non-deleterious mutations of the BRCA gene.  The patient has also  undergone genetic testing.  The results are not available to me.  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 Tobacco Use - No.  Alcohol Use -  no  Review of Systems       Denies fever, malais, weight loss, blurry vision, decreased visual acuity, cough, sputum, SOB, hemoptysis, pleuritic pain, palpitaitons, heartburn, abdominal pain, melena, , claudication, or rash.   Vital Signs:  Patient profile:   60 year old female Height:      67 inches Weight:      286 pounds BMI:     44.96 Pulse rate:   90 / minute Pulse rhythm:   regular BP sitting:   108 / 64  (right  arm) Cuff size:   large  Vitals Entered By: Danielle Rankin, CMA (Jun 02, 2009 4:28 PM)  Physical Exam  General:  Affect appropriate Healthy:  appears stated age HEENT: normal Neck supple with no adenopathy JVP normal no bruits no thyromegaly Lungs clear with no wheezing and good diaphragmatic motion Heart:  S1/S2 no murmur,rub, gallop or click PMI normal Abdomen: benighn, BS positve, no tenderness, no AAA no bruit.  No HSM or HJR Distal pulses intact with no bruits Plus 2 LLE edema Neuro non-focal Skin warm and dry    Impression & Recommendations:  Problem # 1:  ESSENTIAL HYPERTENSION, BENIGN (ICD-401.1) Well controlled The following medications were removed from the medication list:    Furosemide 20 Mg Tabs (Furosemide) .Marland Kitchen... Prn Her updated medication list for this problem includes:    Lisinopril-hydrochlorothiazide 20-25 Mg Tabs (Lisinopril-hydrochlorothiazide) .Marland Kitchen... Take 1 tablet by mouth once a day  Orders: EKG w/ Interpretation (93000)  Problem # 2:  DVT (ICD-453.40) Continue coumadin.  Ok to check INR Q 6-8 weeks  Problem # 3:  EDEMA (ICD-782.3) Post phlebitic  Continue Lasix Script for graduated 20-62mm supporr hose.   Orders: EKG w/ Interpretation (93000)  Problem # 4:  ELECTROCARDIOGRAM, ABNORMAL (ICD-794.31) No change  Poor R wave progression secondary to body habitus The following medications were removed from the medication list:    Lovenox 120 Mg/0.10ml Soln (Enoxaparin sodium) ..... Uad    Lovenox 150 Mg/ml Soln (Enoxaparin sodium) ..... Inject 135 mg subcutaneously as directed two times a day. Her updated medication list for this problem includes:    Warfarin Sodium 5 Mg Tabs (Warfarin sodium) ..... Use as directed by anticoagulation clinic    Lisinopril-hydrochlorothiazide 20-25 Mg Tabs (Lisinopril-hydrochlorothiazide) .Marland Kitchen... Take 1 tablet by mouth once a day  Patient Instructions: 1)  Your physician recommends that you schedule a follow-up  appointment in: 1 YR WITH DR Eden Emms 2)  Your physician recommends that you continue on your current medications as directed. Please refer to the Current Medication list given to you today.   EKG Report  Procedure date:  06/02/2009  Findings:      NSR 90 Low voltage QRS Borderline ECG

## 2010-02-10 NOTE — Medication Information (Signed)
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 yes       Details: Completed 20 day course of Amoxicillin today.   Recent/future dental: no  Any missed doses?: no       Is patient compliant with meds? yes       Allergies: 1)  ! * Benoxinate 2)  ! Sulfa  Anticoagulation Management History:      The patient is taking warfarin and comes in today for a routine follow up visit.  Negative risk factors for bleeding include an age less than 92 years old.  The bleeding index is 'low risk'.  Positive CHADS2 values include History of HTN.  Negative CHADS2 values include Age > 27 years old.  The start date was 01/12/2007.  Anticoagulation responsible provider: Juanda Chance MD, Smitty Cords.  INR POC: 2.7.  Cuvette Lot#: [id number].  Exp: 05/2010.    Anticoagulation Management Assessment/Plan:      The patient's current anticoagulation dose is Warfarin sodium 5 mg tabs: Use as directed by Anticoagulation Clinic.  The target INR is 2 - 3.  The next INR is due 05/26/2009.  Anticoagulation instructions were given to patient.  Results were reviewed/authorized by Bethena Midget, RN, BSN.  She was notified by Bethena Midget, RN, BSN.         Prior Anticoagulation Instructions: INR 2.9  Continue on same dosage 2 tablets daily except 1.5 tablets on Mondays and Thursdays.  Recheck in 4 weeks.    Current Anticoagulation Instructions: INR 2.7 continue 10mg s daily except 7.5mg s on Mondays and Thursdays. Recheck in 4 weeks.

## 2010-02-10 NOTE — Medication Information (Signed)
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   Any changes in medication regimen? yes       Details: Will have to be on Lovenox bridge for two procedures planned in February.   Recent/future dental: yes     Details: On Friday, February 4th has dental surgery planned  Any missed doses?: no       Is patient compliant with meds? yes       Current Medications (verified): 1)  Coumadin 5 Mg Solr (Warfarin Sodium) .... Take 1 Tablet By Mouth As Directed 2)  Lisinopril-Hydrochlorothiazide 20-25 Mg Tabs (Lisinopril-Hydrochlorothiazide) .... Take 1 Tablet By Mouth Once A Day 3)  Simvastatin 80 Mg Tabs (Simvastatin) .... Take 1 Tablet By Mouth At Bedtime 4)  Glucosamine-Chondroitin  Caps (Glucosamine-Chondroit-Vit C-Mn) .Marland Kitchen.. 1 Tablet Bid 5)  Calcium-D 600-200 Mg-Unit Caps (Calcium Carbonate-Vitamin D) .... Take One Tablet By Mouth Two Times A Day 6)  Vitamin E 400 Unit Caps (Vitamin E) .... One Tablet By Mouth Daily 7)  Multivitamins   Tabs (Multiple Vitamin) .... One Tablet Daily 8)  Vitamin D (Ergocalciferol) 50000 Unit Caps (Ergocalciferol) .... Uad 9)  Fluticasone Propionate  Nasal Spray .... Uad As Needed 10)  Ferrous Sulfate 325 (65 Fe) Mg Tabs (Ferrous Sulfate) .... One Tablet Daily 11)  Fish Oil   Oil (Fish Oil) .... One Tablet Daily 12)  Niacin 500 Mg Tabs (Niacin) .... One Tablet Daily 13)  Vitamin B-3 .... One Tablet Daily 14)  Furosemide 20 Mg Tabs (Furosemide) .... Prn 15)  Metrolotion 0.75 % Lotn (Metronidazole) .... Prn 16)  Lovenox 120 Mg/0.32ml Soln  (Enoxaparin Sodium) .... Uad 17)  Triamcinolone Acetonide 0.1 % Crea (Triamcinolone Acetonide) .... Uad 18)  Cetirizine Hcl 10 Mg Tabs (Cetirizine Hcl) .Marland Kitchen.. 1 Tab Every Morning 19)  Tylenol Extra Strength 500 Mg Tabs (Acetaminophen) .... As Needed 20)  Unisom 25 Mg Tabs (Doxylamine Succinate (Sleep)) .... Prn 21)  Ultra Flora Ib .Marland Kitchen.. 1 Tablet Every Morning  Allergies (verified): 1)  ! * Benoxinate 2)  ! Sulfa  Anticoagulation Management History:      The patient is taking warfarin and comes in today for a routine follow up visit.  Negative risk factors for bleeding include an age less than 92 years old.  The bleeding index is 'low risk'.  Positive CHADS2 values include History of HTN.  Negative CHADS2 values include Age > 71 years old.  The start date was 01/12/2007.  Anticoagulation responsible provider: Shirlee Latch MD, Braylon Lemmons.  INR POC: 3.1.  Cuvette Lot#: [id number].  Exp: 04/2010.    Anticoagulation Management Assessment/Plan:      The patient's current anticoagulation dose is Coumadin 5 mg solr: Take 1 tablet by mouth as directed.  The target INR is 2 - 3.  The next INR is due 02/17/2009.  Anticoagulation instructions were given to patient.  Results were reviewed/authorized by Lynann Bologna, PharmD.  She was notified by Lynann Bologna.         Prior Anticoagulation Instructions: INR 4.4 Omit today's dose then resume to regular dose  of 10mg  daily except  7.5mg  on Mondays and Thursdays.   Current Anticoagulation Instructions: INR 3.1  Lovenox 135 mg Subcutaneously one time on Thursday morning.  Resume coumadin on Friday evening continue through Sunday. Take 2 tabs on Friday, 3 tabs on Saturday and 3 tabs on Sunday. Have INR checked on Monday, February 7th.  Prescriptions: LOVENOX 150 MG/ML SOLN (ENOXAPARIN SODIUM) inject 135 mg subcutaneously as directed two times a day.  #20 x 1   Entered by:   Michelle Woods   Authorized by:   Lutricia Widjaja, MD   Signed by:   Michelle Woods on 02/10/2009    Method used:   Electronically to        [facility]  [address]  [id number] ([facility])       [address] [place], [place]  [address]       Ph: [phone] or [phone]       Fax: [phone]   RxID:   [id number]

## 2010-02-10 NOTE — Medication Information (Signed)
Summary: rov/ln  Anticoagulant Therapy  Managed by: Weston Brass, PharmD PCP: Dr. 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 no    Recent/future dental: no  Any missed doses?: no       Is patient compliant with meds? yes       Current Medications (verified): 1)  Warfarin Sodium 5 Mg Tabs (Warfarin Sodium) .... Use As Directed By Anticoagulation Clinic 2)  Lisinopril-Hydrochlorothiazide 20-25 Mg Tabs (Lisinopril-Hydrochlorothiazide) .... Take 1 Tablet By Mouth Once A Day 3)  Glucosamine-Chondroitin  Caps (Glucosamine-Chondroit-Vit C-Mn) .Marland Kitchen.. 1 Tablet Bid 4)  Calcium-D 600-200 Mg-Unit Caps (Calcium Carbonate-Vitamin D) .... Take One Tablet By Mouth Two Times A Day 5)  Vitamin E 400 Unit Caps (Vitamin E) .... One Tablet By Mouth Daily 6)  Multivitamins   Tabs (Multiple Vitamin) .... One Tablet Daily 7)  Vitamin D (Ergocalciferol) 50000 Unit Caps (Ergocalciferol) .Marland Kitchen.. 1 Tab Weekly 8)  Fluticasone Propionate  Nasal Spray .... Uad As Needed 9)  Ferrous Sulfate 325 (65 Fe) Mg Tabs (Ferrous Sulfate) .... One Tablet Daily 10)  Fish Oil   Oil (Fish Oil) .... One Tablet Daily 11)  Niacin 500 Mg Tabs (Niacin) .... One Tablet Daily 12)  Triamcinolone Acetonide 0.1 % Crea (Triamcinolone Acetonide) .... Uad 13)  Cetirizine Hcl 10 Mg Tabs (Cetirizine Hcl) .Marland Kitchen.. 1 Tab Every Morning 14)  Tylenol Extra Strength 500 Mg Tabs (Acetaminophen) .... As Needed 15)  Pravastatin Sodium 80 Mg Tabs (Pravastatin Sodium) .... Take One Tablet By Mouth Daily At Bedtime  Allergies: 1)  ! * Benoxinate 2)  ! Sulfa  Anticoagulation Management History:      The patient is taking warfarin and comes in  today for a routine follow up visit.  Negative risk factors for bleeding include an age less than 68 years old.  The bleeding index is 'low risk'.  Positive CHADS2 values include History of HTN.  Negative CHADS2 values include Age > 87 years old.  The start date was 01/12/2007.  Anticoagulation responsible provider: Antoine Poche MD, Fayrene Fearing.  INR POC: 3.9.  Cuvette Lot#: [id number].  Exp: 10/2010.    Anticoagulation Management Assessment/Plan:      The patient's current anticoagulation dose is Warfarin sodium 5 mg tabs: Use as directed by Anticoagulation Clinic.  The target INR is 2 - 3.  The next INR is due 09/22/2009.  Anticoagulation instructions were given to patient.  Results were reviewed/authorized by Weston Brass, PharmD.  She was notified by Liana Gerold, PharmD Candidate.         Prior Anticoagulation Instructions: INR 3.5  Hold coumadin today and then resume normal regimen of 2 tabs daily except for 1.5 tabs on Monday and Thursday.  Increase green vegetables.  Re-check in 3 weeks.  Current Anticoagulation Instructions: INR 3.9  Skip today's dose, then begin 2 tablets daily except 1.5 tablets Mon, Wed and Fri.  Return to clinic.

## 2010-02-10 NOTE — Medication Information (Signed)
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 no    Recent/future dental: no  Any missed doses?: no       Is patient compliant with meds? yes       Allergies (verified): 1)  ! * Benoxinate 2)  ! Sulfa  Anticoagulation Management History:      The patient is taking warfarin and comes in today for a routine follow up visit.  Negative risk factors for bleeding include an age less than 44 years old.  The bleeding index is 'low risk'.  Positive CHADS2 values include History of HTN.  Negative CHADS2 values include Age > 50 years old.  The start date was 01/12/2007.  Anticoagulation responsible provider: Shirlee Latch MD, Layla Kesling.  INR POC: 2.9.  Exp: 05/2010.    Anticoagulation Management Assessment/Plan:      The patient's current anticoagulation dose is Coumadin 5 mg solr: Take 1 tablet by mouth as directed.  The target INR is 2 - 3.  The next INR is due 04/28/2009.  Anticoagulation instructions were given to patient.  Results were reviewed/authorized by Cloyde Reams, RN, BSN.  She was notified by Cloyde Reams RN.         Prior Anticoagulation Instructions: INR 2.1  CONTINUE TO TAKE 2 TABLETS EVERYDAY EXCEPT TAKE 1.5 TABLETS ON MONDAY AND THURSDAY.  RECHECK IN 3 WEEKS.    Current Anticoagulation Instructions: INR 2.9  Continue on same dosage 2 tablets daily except 1.5 tablets on Mondays and Thursdays.  Recheck in 4 weeks.

## 2010-02-10 NOTE — Medication Information (Signed)
Summary: rov  Anticoagulant Therapy  Managed by: Bethena Midget, RN, BSN PCP: Dr. 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 yes       Details: Completed Zpak on 11/21, Started Crestor approx 30 days ago  Recent/future dental: no  Any missed doses?: yes     Details: missed 5mg s dose apprx. one week before last visit  Is patient compliant with meds? yes       Current Medications (verified): 1)  Warfarin Sodium 5 Mg Tabs (Warfarin Sodium) .... Use As Directed By Anticoagulation Clinic 2)  Lisinopril-Hydrochlorothiazide 20-25 Mg Tabs (Lisinopril-Hydrochlorothiazide) .... Take 1 Tablet By Mouth Once A Day 3)  Glucosamine-Chondroitin  Caps (Glucosamine-Chondroit-Vit C-Mn) .Marland Kitchen.. 1 Tablet Bid 4)  Calcium-D 600-200 Mg-Unit Caps (Calcium Carbonate-Vitamin D) .... Take One Tablet By Mouth Two Times A Day 5)  Vitamin E 400 Unit Caps (Vitamin E) .... One Tablet By Mouth Daily 6)  Multivitamins   Tabs (Multiple Vitamin) .... One Tablet Daily 7)  Vitamin D (Ergocalciferol) 50000 Unit Caps (Ergocalciferol) .Marland Kitchen.. 1 Tab Weekly 8)  Fluticasone Propionate  Nasal Spray .... Uad As Needed 9)  Ferrous Sulfate 325 (65 Fe) Mg Tabs (Ferrous Sulfate) .... One Tablet Daily 10)  Fish Oil   Oil (Fish Oil) .... One Tablet Daily 11)  Niacin 500 Mg Tabs (Niacin) .... One Tablet Daily 12)  Triamcinolone Acetonide 0.1 % Crea (Triamcinolone Acetonide) .... Uad 13)  Cetirizine Hcl 10 Mg Tabs (Cetirizine Hcl) .Marland Kitchen.. 1 Tab Every Morning 14)  Tylenol Extra Strength 500 Mg Tabs (Acetaminophen) .... As Needed 15)  Crestor 10 Mg Tabs (Rosuvastatin Calcium) .... Take 1  Tablet At Bedtime  Allergies: 1)  ! * Benoxinate 2)  ! Sulfa  Anticoagulation Management History:      The patient is taking warfarin and comes in today for a routine follow up visit.  Negative risk factors for bleeding include an age less than 91 years old.  The bleeding index is 'low risk'.  Positive CHADS2 values include History of HTN.  Negative CHADS2 values include Age > 61 years old.  The start date was 01/12/2007.  Anticoagulation responsible provider: Tenny Craw MD, Gunnar Fusi.  INR POC: 3.2.  Cuvette Lot#: [id number].  Exp: 10/2010.    Anticoagulation Management Assessment/Plan:      The patient's current anticoagulation dose is Warfarin sodium 5 mg tabs: Use as directed by Anticoagulation Clinic.  The target INR is 2 - 3.  The next INR is due 01/06/2010.  Anticoagulation instructions were given to patient.  Results were reviewed/authorized by Bethena Midget, RN, BSN.  She was notified by Bethena Midget, RN, BSN.         Prior Anticoagulation Instructions: INR 3.0 (goal 2.0-3.0)  Continue current schedule of 2 tablets everyday except 1.5 tablets on MWF.  Since on upper range of goal, can start to include 1-2 salads a week and will recheck in 4 weeks.  Current Anticoagulation Instructions: INR 3.2 Today only take 1/2 pill then change dose to 1 1/2 pills everyday except 2 pills on Tuesdays, Thursdays and Sundays. Recheck in 3 weeks.

## 2010-02-10 NOTE — Medication Information (Signed)
Summary: rov/ewj  Anticoagulant Therapy  Managed by: Louann Sjogren, PharmD PCP: Dr. 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 yes       Details: switched pravastatin to crestor  Recent/future dental: no  Any missed doses?: no       Is patient compliant with meds? yes      Comments: Wants to start having salads.  Current Medications (verified): 1)  Warfarin Sodium 5 Mg Tabs (Warfarin Sodium) .... Use As Directed By Anticoagulation Clinic 2)  Lisinopril-Hydrochlorothiazide 20-25 Mg Tabs (Lisinopril-Hydrochlorothiazide) .... Take 1 Tablet By Mouth Once A Day 3)  Glucosamine-Chondroitin  Caps (Glucosamine-Chondroit-Vit C-Mn) .Marland Kitchen.. 1 Tablet Bid 4)  Calcium-D 600-200 Mg-Unit Caps (Calcium Carbonate-Vitamin D) .... Take One Tablet By Mouth Two Times A Day 5)  Vitamin E 400 Unit Caps (Vitamin E) .... One Tablet By Mouth Daily 6)  Multivitamins   Tabs (Multiple Vitamin) .... One Tablet Daily 7)  Vitamin D (Ergocalciferol) 50000 Unit Caps (Ergocalciferol) .Marland Kitchen.. 1 Tab Weekly 8)  Fluticasone Propionate  Nasal Spray .... Uad As Needed 9)  Ferrous Sulfate 325 (65 Fe) Mg Tabs (Ferrous Sulfate) .... One Tablet Daily 10)  Fish Oil   Oil (Fish Oil) .... One Tablet Daily 11)  Niacin 500 Mg Tabs (Niacin) .... One Tablet Daily 12)  Triamcinolone Acetonide 0.1 % Crea (Triamcinolone Acetonide) .... Uad 13)  Cetirizine Hcl 10 Mg Tabs (Cetirizine Hcl) .Marland Kitchen.. 1 Tab Every Morning 14)  Tylenol Extra Strength 500 Mg Tabs (Acetaminophen) .... As Needed 15)  Pravastatin Sodium 80 Mg Tabs (Pravastatin Sodium) .... Take One Tablet By Mouth Daily At Bedtime  Allergies (verified): 1)  ! *  Benoxinate 2)  ! Sulfa  Anticoagulation Management History:      Negative risk factors for bleeding include an age less than 77 years old.  The bleeding index is 'low risk'.  Positive CHADS2 values include History of HTN.  Negative CHADS2 values include Age > 65 years old.  The start date was 01/12/2007.  Anticoagulation responsible Silvie Obremski: Allred MD, Fayrene Fearing.  INR POC: 3.0.  Exp: 11/2010.    Anticoagulation Management Assessment/Plan:      The patient's current anticoagulation dose is Warfarin sodium 5 mg tabs: Use as directed by Anticoagulation Clinic.  The target INR is 2 - 3.  The next INR is due 12/15/2009.  Anticoagulation instructions were given to patient.  Results were reviewed/authorized by Louann Sjogren, PharmD.         Prior Anticoagulation Instructions: INR 2.8  Continue on same dosage 2 tablets daily except 1.5 tablets on Mondays, Wednesdays, and Fridays.  Recheck in 4 weeks.    Current Anticoagulation Instructions: INR 3.0 (goal 2.0-3.0)  Continue current schedule of 2 tablets everyday except 1.5 tablets on MWF.  Since on upper range of goal, can start to include 1-2 salads a week and will recheck in 4 weeks.

## 2010-02-10 NOTE — Letter (Signed)
[facility]   Imported By: Roderic Ovens 07/16/2009 10:31:35  _____________________________________________________________________  External Attachment:    Type:   Image     Comment:   External Document

## 2010-02-10 NOTE — Letter (Signed)
Summary: Appointment - Reminder 2  [facility], Main Office  [address]   [place], [place] [address]   Phone: [phone]  Fax: [phone]     February 05, 2009 MRN: [id number]   [name] [address] [place], [place]  [address]   Dear Ms. [facility],  Our records indicate that it is time to schedule a follow-up appointment with Dr. Eden Emms. It is very important that we reach you to schedule this appointment. We look forward to participating in your health care needs. Please contact us at the number listed above at your earliest convenience to schedule your appointment.  If you are unable to make an appointment at this time, give [place] a call so we can update our records.  Sincerely,   [facility] Scheduling Team

## 2010-02-10 NOTE — Medication Information (Signed)
Summary: rov/cb  Anticoagulant Therapy  Managed by: Weston Brass, PharmD PCP: Dr. 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 no    Recent/future dental: no  Any missed doses?: no       Is patient compliant with meds? yes      Comments: requested patient to returnin 3 weeks, but would not come back earlier than 4 weeks states that MD has given her permission to check INR every 6 weeks  Allergies: 1)  ! * Benoxinate 2)  ! Sulfa  Anticoagulation Management History:      The patient is taking warfarin and comes in today for a routine follow up visit.  Negative risk factors for bleeding include an age less than 59 years old.  The bleeding index is 'low risk'.  Positive CHADS2 values include History of HTN.  Negative CHADS2 values include Age > 85 years old.  The start date was 01/12/2007.  Anticoagulation responsible provider: Juanda Chance MD, Smitty Cords.  INR POC: 3.5.  Cuvette Lot#: [id number].  Exp: 08/2010.    Anticoagulation Management Assessment/Plan:      The patient's current anticoagulation dose is Warfarin sodium 5 mg tabs: Use as directed by Anticoagulation Clinic.  The target INR is 2 - 3.  The next INR is due 09/02/2009.  Anticoagulation instructions were given to patient.  Results were reviewed/authorized by Weston Brass, PharmD.  She was notified by Dillard Cannon.         Prior Anticoagulation Instructions: INR 2.8. Take 2 tablets daily except 1.5 tablets on Mon and Thurs. Recheck in 5 weeks due to vacation the following week.  Current Anticoagulation Instructions: INR 3.5  Hold coumadin today and then resume normal regimen of  2 tabs daily except for 1.5 tabs on Monday and Thursday.  Increase green vegetables.  Re-check in 3 weeks.

## 2010-02-10 NOTE — Medication Information (Signed)
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 no    Recent/future dental: no  Any missed doses?: yes     Details: held for colonoscopy  Is patient compliant with meds? yes      Comments: Pt was on lovenox prior to colonscopy (2/18).  During the colonoscopy she had to have a few polyps cauterized.  Dr. Loreta Ave instructed pt to resume lovenox 135mg  once daily on 2/18 x 5 days then stop.  Resume warfarin on 03/05/09.  Shelby Dubin will call Dr. Loreta Ave [phone]) 2/22 to discuss anticoagulation plan and relay information to Mrs. Macknight.  Current Medications (verified): 1)  Coumadin 5 Mg Solr (Warfarin Sodium) .... Take 1 Tablet By Mouth As Directed 2)  Lisinopril-Hydrochlorothiazide 20-25 Mg Tabs (Lisinopril-Hydrochlorothiazide) .... Take 1 Tablet By Mouth Once A Day 3)  Simvastatin 80 Mg Tabs (Simvastatin) .... Take 1 Tablet By Mouth At Bedtime 4)  Glucosamine-Chondroitin  Caps (Glucosamine-Chondroit-Vit C-Mn) .Marland Kitchen.. 1 Tablet Bid 5)  Calcium-D 600-200 Mg-Unit Caps (Calcium Carbonate-Vitamin D) .... Take One Tablet By Mouth Two Times A Day 6)  Vitamin E 400 Unit Caps (Vitamin E) .... One Tablet By Mouth Daily 7)  Multivitamins   Tabs (Multiple Vitamin) .... One Tablet Daily 8)  Vitamin D (Ergocalciferol) 50000 Unit Caps (Ergocalciferol) .... Uad 9)  Fluticasone Propionate  Nasal Spray .... Uad As Needed 10)  Ferrous Sulfate 325 (65 Fe) Mg Tabs (Ferrous Sulfate) .... One Tablet Daily 11)  Fish Oil   Oil (Fish Oil) .... One Tablet Daily 12)  Niacin 500 Mg Tabs (Niacin) .... One Tablet Daily 13)  Vitamin  B-3 .... One Tablet Daily 14)  Furosemide 20 Mg Tabs (Furosemide) .... Prn 15)  Metrolotion 0.75 % Lotn (Metronidazole) .... Prn 16)  Lovenox 120 Mg/0.79ml Soln (Enoxaparin Sodium) .... Uad 17)  Triamcinolone Acetonide 0.1 % Crea (Triamcinolone Acetonide) .... Uad 18)  Cetirizine Hcl 10 Mg Tabs (Cetirizine Hcl) .Marland Kitchen.. 1 Tab Every Morning 19)  Tylenol Extra Strength 500 Mg Tabs (Acetaminophen) .... As Needed 20)  Unisom 25 Mg Tabs (Doxylamine Succinate (Sleep)) .... Prn 21)  Ultra Flora Ib .Marland Kitchen.. 1 Tablet Every Morning 22)  Lovenox 150 Mg/ml Soln (Enoxaparin Sodium) .... Inject 135 Mg Subcutaneously As Directed Two Times A Day.  Allergies (verified): 1)  ! * Benoxinate 2)  ! Sulfa  Anticoagulation Management History:      The patient is taking warfarin and comes in today for a routine follow up visit.  Negative risk factors for bleeding include an age less than 75 years old.  The bleeding index is 'low risk'.  Positive CHADS2 values include History of HTN.  Negative CHADS2 values include Age > 3 years old.  The start date was 01/12/2007.  Anticoagulation responsible provider: Apolo Cutshaw MD, Reuel Boom.  INR POC: 1.0.  Cuvette Lot#: [id number].  Exp: 04/2010.    Anticoagulation Management Assessment/Plan:      The patient's current anticoagulation dose is Coumadin 5 mg solr: Take 1 tablet by mouth as directed.  The target INR is 2 - 3.  The next INR is due 03/06/2009.  Anticoagulation instructions  were given to patient.  Results were reviewed/authorized by Bethanne Ginger, PharmD.  She was notified by Bethanne Ginger.         Prior Anticoagulation Instructions: INR 1.9  TAKE 2 TABLETS TONIGHT THEN CONTINUE TAKING 2 TABLETS EVERYDAY EXCEPT TAKE 1.5 ON MONDAYS AND THURSDAYS. TAKE LAST DOSE OF COUMADIN ON FEB 12.  TAKE NOTHING ON FEB 13.  START LOVENOX ON THE NIGHT OF FEB 14 - INJECT 135 mg Subcutaneously ONCE ON FEB 14, TWICE A DAY ON FEB 15 AND 16, AND ONCE ON FEB 17.  ASK MD FOR FURTHER INSTRUCTIONS ABOUT  LOVENOX AFTER COLONSCOPY.  NEXT APPOINTMENT IS FEB 21.  Current Anticoagulation Instructions: Continue with Lovenox 135mg  - 1 injection tonight, and 1 injection 12 hours later in the AM.  No warfarin at this time. Mary P. will contact you in the AM for further instructions.

## 2010-02-10 NOTE — Medication Information (Signed)
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 no    Recent/future dental: no  Any missed doses?: no       Is patient compliant with meds? yes       Current Medications (verified): 1)  Coumadin 5 Mg Solr (Warfarin Sodium) .... Take 1 Tablet By Mouth As Directed 2)  Lisinopril-Hydrochlorothiazide 20-25 Mg Tabs (Lisinopril-Hydrochlorothiazide) .... Take 1 Tablet By Mouth Once A Day 3)  Simvastatin 80 Mg Tabs (Simvastatin) .... Take 1 Tablet By Mouth At Bedtime 4)  Glucosamine-Chondroitin  Caps (Glucosamine-Chondroit-Vit C-Mn) .Marland Kitchen.. 1 Tablet Bid 5)  Calcium-D 600-200 Mg-Unit Caps (Calcium Carbonate-Vitamin D) .... Take One Tablet By Mouth Two Times A Day 6)  Vitamin E 400 Unit Caps (Vitamin E) .... One Tablet By Mouth Daily 7)  Multivitamins   Tabs (Multiple Vitamin) .... One Tablet Daily 8)  Vitamin D (Ergocalciferol) 50000 Unit Caps (Ergocalciferol) .... Uad 9)  Fluticasone Propionate  Nasal Spray .... Uad As Needed 10)  Ferrous Sulfate 325 (65 Fe) Mg Tabs (Ferrous Sulfate) .... One Tablet Daily 11)  Fish Oil   Oil (Fish Oil) .... One Tablet Daily 12)  Niacin 500 Mg Tabs (Niacin) .... One Tablet Daily 13)  Vitamin B-3 .... One Tablet Daily 14)  Furosemide 20 Mg Tabs (Furosemide) .... Prn 15)  Metrolotion 0.75 % Lotn (Metronidazole) .... Prn 16)  Lovenox 120 Mg/0.35ml Soln (Enoxaparin Sodium) .... Uad 17)  Triamcinolone Acetonide 0.1 % Crea (Triamcinolone Acetonide) .... Uad 18)  Cetirizine Hcl 10 Mg  Tabs (Cetirizine Hcl) .Marland Kitchen.. 1 Tab Every Morning 19)  Tylenol Extra Strength 500 Mg Tabs (Acetaminophen) .... As Needed 20)  Unisom 25 Mg Tabs (Doxylamine Succinate (Sleep)) .... Prn 21)  Ultra Flora Ib .Marland Kitchen.. 1 Tablet Every Morning 22)  Lovenox 150 Mg/ml Soln (Enoxaparin Sodium) .... Inject 135 Mg Subcutaneously As Directed Two Times A Day.  Allergies (verified): 1)  ! * Benoxinate 2)  ! Sulfa  Anticoagulation Management History:      The patient is taking warfarin and comes in today for a routine follow up visit.  Negative risk factors for bleeding include an age less than 55 years old.  The bleeding index is 'low risk'.  Positive CHADS2 values include History of HTN.  Negative CHADS2 values include Age > 44 years old.  The start date was 01/12/2007.  Anticoagulation responsible provider: Shirlee Latch MD, Navy Belay.  INR POC: 2.1.  Cuvette Lot#: [id number].  Exp: 05/2010.    Anticoagulation Management Assessment/Plan:      The patient's current anticoagulation dose is Coumadin 5 mg solr: Take 1 tablet by mouth as directed.  The target INR is 2 - 3.  The next INR is due 03/31/2009.  Anticoagulation instructions were given to patient.  Results were reviewed/authorized by Louie Casa, PharmD.         Prior Anticoagulation Instructions: INR 1.3  Take 3 tablets today and 2.5 tablets tomorrow then resume same dosage 2 tablets daily except 1.5 tablets on Mondays and Thursdays.  Continue on Lovenox two times a day.  Recheck on Monday.  Current Anticoagulation Instructions: INR 2.1  CONTINUE TO TAKE 2 TABLETS EVERYDAY EXCEPT TAKE 1.5 TABLETS ON MONDAY AND THURSDAY.  RECHECK IN 3 WEEKS.

## 2010-02-10 NOTE — Medication Information (Signed)
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   Bleeding/hemorrhagic complications: no    Recent/future hospitalizations: no    Any changes in medication regimen? no    Recent/future dental: no  Any missed doses?: no       Is patient compliant with meds? yes       Current Medications (verified): 1)  Warfarin Sodium 5 Mg Tabs (Warfarin Sodium) .... Use As Directed By Anticoagulation Clinic 2)  Lisinopril-Hydrochlorothiazide 20-25 Mg Tabs (Lisinopril-Hydrochlorothiazide) .... Take 1 Tablet By Mouth Once A Day 3)  Simvastatin 80 Mg Tabs (Simvastatin) .... Take 1 Tablet By Mouth At Bedtime 4)  Glucosamine-Chondroitin  Caps (Glucosamine-Chondroit-Vit C-Mn) .Marland Kitchen.. 1 Tablet Bid 5)  Calcium-D 600-200 Mg-Unit Caps (Calcium Carbonate-Vitamin D) .... Take One Tablet By Mouth Two Times A Day 6)  Vitamin E 400 Unit Caps (Vitamin E) .... One Tablet By Mouth Daily 7)  Multivitamins   Tabs (Multiple Vitamin) .... One Tablet Daily 8)  Vitamin D (Ergocalciferol) 50000 Unit Caps (Ergocalciferol) .... Uad 9)  Fluticasone Propionate  Nasal Spray .... Uad As Needed 10)  Ferrous Sulfate 325 (65 Fe) Mg Tabs (Ferrous Sulfate) .... One Tablet Daily 11)  Fish Oil   Oil (Fish Oil) .... One Tablet Daily 12)  Niacin 500 Mg Tabs (Niacin) .... One Tablet Daily 13)  Vitamin B-3 .... One Tablet Daily 14)  Furosemide 20 Mg Tabs (Furosemide) .... Prn 15)  Metrolotion 0.75 % Lotn (Metronidazole) .... Prn 16)  Lovenox 120 Mg/0.81ml Soln (Enoxaparin Sodium) .... Uad 17)  Triamcinolone Acetonide 0.1 % Crea (Triamcinolone Acetonide) .... Uad 18)  Cetirizine Hcl 10 Mg Tabs (Cetirizine Hcl)  .Marland Kitchen.. 1 Tab Every Morning 19)  Tylenol Extra Strength 500 Mg Tabs (Acetaminophen) .... As Needed 20)  Unisom 25 Mg Tabs (Doxylamine Succinate (Sleep)) .... Prn 21)  Ultra Flora Ib .Marland Kitchen.. 1 Tablet Every Morning 22)  Lovenox 150 Mg/ml Soln (Enoxaparin Sodium) .... Inject 135 Mg Subcutaneously As Directed Two Times A Day.  Allergies (verified): 1)  ! * Benoxinate 2)  ! Sulfa  Anticoagulation Management History:      The patient is taking warfarin and comes in today for a routine follow up visit.  Negative risk factors for bleeding include an age less than 28 years old.  The bleeding index is 'low risk'.  Positive CHADS2 values include History of HTN.  Negative CHADS2 values include Age > 41 years old.  The start date was 01/12/2007.  Anticoagulation responsible provider: Clifton James MD, Cristal Deer.  INR POC: 3.  Cuvette Lot#: [id number].  Exp: 08/2010.    Anticoagulation Management Assessment/Plan:      The patient's current anticoagulation dose is Warfarin sodium 5 mg tabs: Use as directed by Anticoagulation Clinic.  The target INR is 2 - 3.  The next INR is due 06/23/2009.  Anticoagulation instructions were given to patient.  Results were reviewed/authorized by Lynann Bologna, PharmD.  She was notified by Lynann Bologna.         Prior Anticoagulation Instructions: INR 2.7 continue 10mg s daily except 7.5mg s on Mondays and Thursdays. Recheck in 4 weeks.   Current Anticoagulation Instructions: INR 3  The patient  is to continue with the same dose of coumadin.  This dosage includes: 2 tablets daily, except 1.5 tablets Mondays and Thursdays.   Next INR on June 13th at 4:15 pm.

## 2010-02-12 NOTE — Medication Information (Signed)
Summary: rov/tm  Anticoagulant Therapy  Managed by: Samantha Crimes, PharmD PCP: Dr. 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 no    Recent/future dental: no  Any missed doses?: no       Is patient compliant with meds? yes       Current Medications (verified): 1)  Warfarin Sodium 5 Mg Tabs (Warfarin Sodium) .... Use As Directed By Anticoagulation Clinic 2)  Lisinopril-Hydrochlorothiazide 20-25 Mg Tabs (Lisinopril-Hydrochlorothiazide) .... Take 1 Tablet By Mouth Once A Day 3)  Glucosamine-Chondroitin  Caps (Glucosamine-Chondroit-Vit C-Mn) .Marland Kitchen.. 1 Tablet Bid 4)  Calcium-D 600-200 Mg-Unit Caps (Calcium Carbonate-Vitamin D) .... Take One Tablet By Mouth Two Times A Day 5)  Vitamin E 400 Unit Caps (Vitamin E) .... One Tablet By Mouth Daily 6)  Multivitamins   Tabs (Multiple Vitamin) .... One Tablet Daily 7)  Vitamin D (Ergocalciferol) 50000 Unit Caps (Ergocalciferol) .Marland Kitchen.. 1 Tab Weekly 8)  Fluticasone Propionate  Nasal Spray .... Uad As Needed 9)  Ferrous Sulfate 325 (65 Fe) Mg Tabs (Ferrous Sulfate) .... One Tablet Daily 10)  Fish Oil   Oil (Fish Oil) .... One Tablet Daily 11)  Niacin 500 Mg Tabs (Niacin) .... One Tablet Daily 12)  Triamcinolone Acetonide 0.1 % Crea (Triamcinolone Acetonide) .... Uad 13)  Cetirizine Hcl 10 Mg Tabs (Cetirizine Hcl) .Marland Kitchen.. 1 Tab Every Morning 14)  Tylenol Extra Strength 500 Mg Tabs (Acetaminophen) .... As Needed 15)  Crestor 10 Mg Tabs (Rosuvastatin Calcium) .... Take 1 Tablet At Bedtime  Allergies (verified): 1)  ! * Benoxinate 2)  ! Sulfa  Anticoagulation Management History:      Negative  risk factors for bleeding include an age less than 32 years old.  The bleeding index is 'low risk'.  Positive CHADS2 values include History of HTN.  Negative CHADS2 values include Age > 40 years old.  The start date was 01/12/2007.  Anticoagulation responsible provider: Graciela Husbands MD, Viviann Spare.  INR POC: 3.3.  Exp: 10/2010.    Anticoagulation Management Assessment/Plan:      The patient's current anticoagulation dose is Warfarin sodium 5 mg tabs: Use as directed by Anticoagulation Clinic.  The target INR is 2 - 3.  The next INR is due 01/27/2010.  Anticoagulation instructions were given to patient.  Results were reviewed/authorized by Samantha Crimes, PharmD.         Prior Anticoagulation Instructions: INR 3.2 Today only take 1/2 pill then change dose to 1 1/2 pills everyday except 2 pills on Tuesdays, Thursdays and Sundays. Recheck in 3 weeks.   Current Anticoagulation Instructions: Take 1.5 tablets (7.5 mg) today then go to 10 mg (2 tabs) on Tues and Thurs with 7.5 mg every other day Return to clinic on Jan 16th, at 345

## 2010-02-12 NOTE — Medication Information (Signed)
Summary: rov/jb  Anticoagulant Therapy  Managed by: Cloyde Reams, RN, BSN PCP: Dr. 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 yes       Details: Medication list updated.  Recent/future dental: no  Any missed doses?: no       Is patient compliant with meds? yes      Comments: Pt states she is currently taking 2tablets on Sundays, Tuesdays, and Thursdays.    Allergies: 1)  ! * Benoxinate 2)  ! Sulfa   Anticoagulation Management History:      The patient is taking warfarin and comes in today for a routine follow up visit.  Negative risk factors for bleeding include an age less than 35 years old.  The bleeding index is 'low risk'.  Positive CHADS2 values include History of HTN.  Negative CHADS2 values include Age > 31 years old.  The start date was 01/12/2007.  Anticoagulation responsible provider: Daleen Squibb MD, Maisie Fus.  INR POC: 1.8.  Exp: 10/2010.    Anticoagulation Management Assessment/Plan:      The patient's current anticoagulation dose is Warfarin sodium 5 mg tabs: Use as directed by Anticoagulation Clinic.  The target INR is 2 - 3.  The next INR is due 02/23/2010.  Anticoagulation instructions were given to patient.  Results were reviewed/authorized by Cloyde Reams, RN, BSN.  She was notified by Cloyde Reams RN.         Prior Anticoagulation Instructions: Take 1.5 tablets (7.5 mg) today then go to 10 mg (2 tabs) on Tues and Thurs with 7.5 mg every other day Return to clinic on Jan 16th, at 345  Current Anticoagulation Instructions: INR 1.8  Take 2 tablets today, then resume same dosage 1.5 tablets daily except 2 tablets on Sundays, Tuesdays, and Thursdays. Recheck in 3  weeks.

## 2010-02-23 ENCOUNTER — Encounter (INDEPENDENT_AMBULATORY_CARE_PROVIDER_SITE_OTHER): Payer: BC Managed Care – PPO

## 2010-02-23 ENCOUNTER — Encounter: Payer: Self-pay | Admitting: Cardiology

## 2010-02-23 DIAGNOSIS — I8010 Phlebitis and thrombophlebitis of unspecified femoral vein: Secondary | ICD-10-CM

## 2010-02-23 DIAGNOSIS — Z7901 Long term (current) use of anticoagulants: Secondary | ICD-10-CM

## 2010-03-02 DIAGNOSIS — I82409 Acute embolism and thrombosis of unspecified deep veins of unspecified lower extremity: Secondary | ICD-10-CM

## 2010-03-04 NOTE — Medication Information (Signed)
Summary: [facility]in Clinic  Anticoagulant Therapy  Managed by: Windell Hummingbird, RN PCP: Dr. 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 no    Recent/future dental: no  Any missed doses?: no       Is patient compliant with meds? yes       Allergies: 1)  ! * Benoxinate 2)  ! Sulfa  Anticoagulation Management History:      The patient is taking warfarin and comes in today for a routine follow up visit.  Negative risk factors for bleeding include an age less than 74 years old.  The bleeding index is 'low risk'.  Positive CHADS2 values include History of HTN.  Negative CHADS2 values include Age > 12 years old.  The start date was 01/12/2007.  Anticoagulation responsible provider: Jens Som MD, Arlys John.  INR POC: 2.4.  Exp: 10/2010.    Anticoagulation Management Assessment/Plan:      The patient's current anticoagulation dose is Warfarin sodium 5 mg tabs: Use as directed by Anticoagulation Clinic.  The target INR is 2 - 3.  The next INR is due 03/23/2010.  Anticoagulation instructions were given to patient.  Results were reviewed/authorized by Windell Hummingbird, RN.  She was notified by Windell Hummingbird, RN.         Prior Anticoagulation Instructions: INR 1.8  Take 2 tablets today, then resume same dosage 1.5 tablets daily except 2 tablets on Sundays, Tuesdays, and Thursdays. Recheck in 3 weeks.    Current Anticoagulation Instructions: INR 2.4 Continue taking 1.5 tablets everyday, except 2 tablets on Sundays, Tuesdays, and Thursdays. Recheck in 4 weeks.

## 2010-03-23 ENCOUNTER — Encounter (INDEPENDENT_AMBULATORY_CARE_PROVIDER_SITE_OTHER): Payer: BC Managed Care – PPO

## 2010-03-23 ENCOUNTER — Encounter: Payer: Self-pay | Admitting: Cardiology

## 2010-03-23 DIAGNOSIS — I80299 Phlebitis and thrombophlebitis of other deep vessels of unspecified lower extremity: Secondary | ICD-10-CM

## 2010-03-23 LAB — CONVERTED CEMR LAB: POC INR: 1.8

## 2010-03-31 NOTE — Medication Information (Signed)
Summary: rov/pc  Anticoagulant Therapy  Managed by: Samantha Crimes, PharmD PCP: Dr. 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 no    Recent/future dental: no  Any missed doses?: no       Is patient compliant with meds? yes      Comments: incr stress  Current Medications (verified): 1)  Warfarin Sodium 5 Mg Tabs (Warfarin Sodium) .... Use As Directed By Anticoagulation Clinic 2)  Lisinopril-Hydrochlorothiazide 20-25 Mg Tabs (Lisinopril-Hydrochlorothiazide) .... Take 1 Tablet By Mouth Once A Day 3)  Glucosamine-Chondroitin  Caps (Glucosamine-Chondroit-Vit C-Mn) .Marland Kitchen.. 1 Tablet Bid 4)  Calcium-D 600-200 Mg-Unit Caps (Calcium Carbonate-Vitamin D) .... Take One Tablet By Mouth Two Times A Day 5)  Vitamin E 400 Unit Caps (Vitamin E) .... One Tablet By Mouth Daily 6)  Multivitamins   Tabs (Multiple Vitamin) .... One Tablet Daily 7)  Vitamin D (Ergocalciferol) 50000 Unit Caps (Ergocalciferol) .Marland Kitchen.. 1 Tab Weekly 8)  Fluticasone Propionate  Nasal Spray .... As Needed 9)  Ferrous Sulfate 325 (65 Fe) Mg Tabs (Ferrous Sulfate) .... One Tablet Daily 10)  Fish Oil   Oil (Fish Oil) .... Take 2 Tablets Two Times A Day 11)  Niacin 500 Mg Tabs (Niacin) .... One Tablet Daily 12)  Triamcinolone Acetonide 0.1 % Crea (Triamcinolone Acetonide) .... As Needed 13)  Cetirizine Hcl 10 Mg Tabs (Cetirizine Hcl) .Marland Kitchen.. 1 Tab Every Morning 14)  Tylenol Extra Strength 500 Mg Tabs (Acetaminophen) .... As Needed 15)  Crestor 10 Mg Tabs (Rosuvastatin Calcium) .... Take 1 Tablet At Bedtime  Allergies (verified): 1)  ! * Benoxinate 2)  ! Sulfa  Anticoagulation Management History:      Negative risk  factors for bleeding include an age less than 25 years old.  The bleeding index is 'low risk'.  Positive CHADS2 values include History of HTN.  Negative CHADS2 values include Age > 35 years old.  The start date was 01/12/2007.  Anticoagulation responsible provider: Antoine Poche MD, Fayrene Fearing.  INR POC: 1.8.  Exp: 10/2010.    Anticoagulation Management Assessment/Plan:      The patient's current anticoagulation dose is Warfarin sodium 5 mg tabs: Use as directed by Anticoagulation Clinic.  The target INR is 2 - 3.  The next INR is due 04/20/2010.  Anticoagulation instructions were given to patient.  Results were reviewed/authorized by Samantha Crimes, PharmD.         Prior Anticoagulation Instructions: INR 2.4 Continue taking 1.5 tablets everyday, except 2 tablets on Sundays, Tuesdays, and Thursdays. Recheck in 4 weeks.  Current Anticoagulation Instructions: Take 2 tabs tonight, then resume regular regimen Return to clinic in 4 weeks 4/9 @ 415pm

## 2010-04-20 ENCOUNTER — Ambulatory Visit (INDEPENDENT_AMBULATORY_CARE_PROVIDER_SITE_OTHER): Payer: BC Managed Care – PPO | Admitting: *Deleted

## 2010-04-20 DIAGNOSIS — Z7901 Long term (current) use of anticoagulants: Secondary | ICD-10-CM

## 2010-04-20 DIAGNOSIS — I82409 Acute embolism and thrombosis of unspecified deep veins of unspecified lower extremity: Secondary | ICD-10-CM

## 2010-04-20 LAB — POCT INR: INR: 2.6

## 2010-05-18 ENCOUNTER — Encounter: Payer: BC Managed Care – PPO | Admitting: *Deleted

## 2010-05-21 ENCOUNTER — Ambulatory Visit (INDEPENDENT_AMBULATORY_CARE_PROVIDER_SITE_OTHER): Payer: BC Managed Care – PPO | Admitting: *Deleted

## 2010-05-21 DIAGNOSIS — I82409 Acute embolism and thrombosis of unspecified deep veins of unspecified lower extremity: Secondary | ICD-10-CM

## 2010-05-21 LAB — POCT INR: INR: 2.1

## 2010-05-26 NOTE — Assessment & Plan Note (Signed)
[facility]                            CARDIOLOGY OFFICE NOTE   NAME:[name], [name]                    MRN:          [id number]  DATE:03/24/2007                            DOB:          [birth year]    [name] returns today in followup.  She has had an extensive DVT  involving the common femoral vein, superficial femoral vein, popliteal  vein.  Her followup duplex today showed significant recanalization of  the common femoral vein, two-thirds of the superficial saphenous vein,  and compressibility of the popliteal. This was actually quite good news  for her. Her swelling is improved.  She is wearing her graduated  stockings 20-30 mm.  She has been following up in our Coumadin Clinic.  I had a frank discussion with [name] today. Given her central obesity,  previous history of ovarian cancer, and extensive DVT, she will probably  be on lifelong Coumadin or one of its substitutes   She had a myriad of questions again, but seemed to be understanding  things a little bit better.  She again asked me about her alopecia, and  I told there was no connection with this.  We continue to have her on a  4-hour standing restriction. She continues to be on a travel restriction  in terms of needing to get out of a car or ambulating every 30-40  minutes when she travels. She had a few more questions about her  graduated stockings, and I explained the system to her and also the  necessity for her to wear them, particularly to keep from having edema.   We will call in a prescription for her for Lasix 20 mg a day to see if  we can help her lower extremity edema better and switch her  lisinopril/hydrochlorothiazide to just plain old lisinopril.   REVIEW OF SYSTEMS:  Otherwise negative.   PHYSICAL EXAMINATION:  GENERAL:  Her exam is remarkable for an  overweight white female in no distress.  VITAL SIGNS:  Blood pressure is 120/70, pulse 70 and regular, afebrile.  HEENT:  Unremarkable.  LUNGS:  Clear. Good diaphragmatic motion.  No wheezing.  NECK:  No bruit, no lymphadenopathy, thyromegaly or JVP elevation.  CARDIAC:  S1-S2, normal heart sounds, PMI normal.  ABDOMEN:  Benign.  Bowel sounds positive.  No hepatosplenomegaly or  hepatojugular reflux.  EXTREMITIES:  Distal pulse intact. Left leg is about 2 cm larger than  the right leg.  However, the swelling has improved.  There is no cerulea  dolens. PTs are +2.  NEUROLOGIC:  Nonfocal.  SKIN:  Warm and dry.  MUSCULOSKELETAL:  No muscular weakness.   IMPRESSION:  1. Extensive deep vein thrombosis recanalizing, continue Coumadin.  2. Lower extremity edema.  Switch to Lasix 20 a day from      hydrochlorothiazide.  3. Hypertension.  Continue lisinopril 20 a day.  4. Alopecia follow up with primary care MD, no obvious connection of      deep vein thrombosis.  5. Anticoagulation. Follow INR in Coumadin clinic.   I will see her back in 6 months, and she  will have a followup duplex in  3 months.  I did tell her she could travel to the beach so long as she  took breaks during any long car rides.     Noralyn Pick. Eden Emms, MD, [facility]  Electronically Signed    PCN/MedQ  DD: 03/24/2007  DT: 03/25/2007  Job #: [id number]

## 2010-05-26 NOTE — Consult Note (Signed)
VASCULAR SURGERY CONSULTATION   [name], Kathleen A  DOB:  [birth year]                                       01/17/2007  CHART#:[id number]   REASON FOR CONSULTATION:  Left lower extremity deep venous thrombosis.   HISTORY:  This is a pleasant 60 year old woman who was referred by Dr.  Cleta Alberts with a left lower extremity DVT.  She had been traveling to  [place] in October and at that time had noticed some vague pain in her  hip and thigh which sounded potentially like sciatica.  However, her  symptoms persisted over the next several weeks and actually in November  her symptoms became worse.  She was continuing to have left thigh pain  and cramps in her left thigh.  She then began noticing swelling in her  leg and on 12/19/2006 underwent a venous duplex scan which showed an  extensive DVT involving the left common femoral vein, popliteal vein and  also clot in the greater saphenous vein.  She was admitted, placed on  heparin and converted to Coumadin at [facility].  She had a  followup duplex scan on 01/01/2007 which showed again clot in the left  common femoral, superficial femoral and poplitea veins with nonocclusive  thrombus in the greater saphenous vein.  She has been maintained on  Coumadin since that time.  Her last pro-time was 2.6 on 01/06/2007.  She  is making arrangements to have her Coumadin followed up at [facility]  Coumadin clinic.  She has had no previous history of DVT or phlebitis.  Of note, her risk factors for DVT include obesity and a history of  ovarian cancer.   Of note, while she was in the hospital she denied any problems with  significant shortness of breath or pleuritic chest pain.  She states she  did have a CT scan which showed no evidence of pulmonary embolus.   PAST MEDICAL HISTORY:  Significant for hypertension,  hypercholesterolemia, obesity and history of ovarian cancer stage 1  diagnosed in 2003.  She has had this removed  surgically and also is  undergoing chemotherapy.  She also had a hernia which was repaired with  mesh.  She denies any history of diabetes, history of previous  myocardial infarction, history of congestive heart failure or history of  COPD.   FAMILY HISTORY:  There is no history of premature cardiovascular disease  or venous disease that she is aware of.   SOCIAL HISTORY:  She is single.  She does not have any children.  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  She does not use tobacco.  She quit in 1970.   REVIEW OF SYSTEMS AND MEDICATIONS:  Are documented on the medical  history form in her chart.   PHYSICAL EXAMINATION:  General:  This is a pleasant 60 year old woman  who appears her stated age.  Vital signs:  Blood pressure is 111/70,  heart rate is 109.  HEENT:  Extraocular motions are intact.  There is no  cervical lymphadenopathy.  I do not detect any carotid bruits.  Lungs:  Clear bilaterally to auscultation.  Cardiovascular:  She has a regular  rate and rhythm.  Abdomen:  Her abdomen is obese and difficult to  assess.  I cannot palpate an aneurysm.  She has a lower midline incision  which has healed.  She has  normal pitched bowel sounds.  Vascular:  She  has palpable femoral pulses and palpable dorsalis pedis and posterior  tibial pulses bilaterally.  Both feet are warm and well perfused.  Extremities:  Exam reveals mild left thigh and left calf swelling with  normal capillary refill.  She has no rashes or ischemic ulcers.  Neurological:  She has no focal weakness in the upper extremities or  lower extremities.   This patient presents with headache first time left lower extremity DVT.  As stated I would recommend followup duplex scan in 6 months and if  there has been some improvement at that time then I would probably  discontinue her Coumadin after 6 months.  Obviously she will be at  increased risk for future DVTs given her history of cancer and obesity  and previous DVT.  However,  before comitting her to lifetime Coumadin I  think it would be reasonable to try her off Coumadin in 6 months.  We  have discussed the importance of continuing with the compression therapy  and the importance of an intermittent leg elevation.  At this point I  think it would be safe to gradually resume her activity and try teaching  again as tolerated.  She may have to adjust her routine to allow her not  to spend too much time standing and perhaps some time to intermittently  elevate her leg during the day.  I would be happy to see her back at any  time as needed.  I will also send a brief letter to the benefits office  for her school concerning these issues.   Di Kindle. Edilia Bo, M.D.  Electronically Signed  CSD/MEDQ  D:  01/17/2007  T:  01/18/2007  Job:  [id number]   cc:   Brett Canales A. Cleta Alberts, M.D.

## 2010-05-26 NOTE — Assessment & Plan Note (Signed)
[facility]                            CARDIOLOGY OFFICE NOTE   NAME:[name], [name]                    MRN:          [id number]  DATE:02/07/2007                            DOB:          Jun 30, 1950    REFERRING PHYSICIAN:  Stan Head. Cleta Alberts, M.D.   [name] is referred by Dr. Cleta Alberts for further followup.  Unfortunately, the patient has had a medical problem since October and  has been getting advice from multiple physicians.   The patient initially saw Dr. Cleta Alberts the end of November.  She had left  lower extremity swelling.  She had had a car trip to [place] to do a  walk for [facility] and developed a fairly extensive DVT.   This included the proximal left common femoral vein, popliteal vein and  clot in the greater saphenous vein.  She was subsequently discharged  after a fairly lengthy hospital stay.  Unfortunately, follow up at [facility]  was not made; I have no idea why she was not seen in the Coumadin clinic  there with [name].  She subsequently had her Coumadin level checked  at [facility] a couple of times;  The patient also was seen by Dr.  Cari Caraway and he felt that the patient needed a followup duplex scan  in 6 months.  He also indicated that she should be out of work for  while, given the extent of her DVT.  Unfortunately now the patient  returns deconditioned, short of breath, fatigued and was sent here for  further followup.  Again, I suspect the only reason she was sent here  was because she needed Coumadin Clinic followup.  [name] and her  sister had numerous questions of me; most of them did not have any  cardiovascular nature to them. I spent over an hour with the patient  answering every question imaginable.   The patient obviously has not had a lot of explanation of the natural  history of her disease and problems.  Some of the questions that were  addressed were as follows:  1. I told the patient that she would always  have swelling in the left      lower extremity.  I explained to her the nature of the deep and      superficial system and the fact that the valves do not repair      themselves.  I explained to her that she should probably be on a      diuretic long-term.  I also explained to her that she should have a      followup duplex in 3 months to further assess clot resolution and      recanalization and then a scan in 6 months.  I would agree with Dr.      Edilia Bo that there is a chance she may be on long-term Coumadin,      given her obesity and history of ovarian cancer.   The patient was asking if she could have a medical pedicure; I said I  would be fine.  She had multiple questions regarding  going back to work.  I disagree with Dr. Edilia Bo; I think she has become more ill since  staying at home, sitting in bed, elevating her legs.  I do not see any  reason why she cannot go back to work and possibly keep her time of  standing to limited hours.  She probably does need a graduated  compression stocking for the left leg.  She also had concerns about a  cough.  She has been on lisinopril for 4 years.  I actually saw no  reason for to her to be on this, as she is not particularly hypertensive  and told her that we would stop her lisinopril and see if this improves.  She also had multiple questions about hair loss and any relationship  between her DVT and some scalp injections that a dermatologist gave her  in October; I told her I did not know if this was related, not knowing  what shots were, but I suspect it was not.  We also talked about the  need to check her blood for hypercoagulability when she is off Coumadin.   Again, there were other more numerous questions which took quite a bit  of time to answer.   PAST MEDICAL HISTORY:  Remarkable for:  1. Hypertension.  2. Hypercholesterolemia.  3. Central obesity.  4. Stage I ovarian cancer, treated by Dr. [name] in 2003 with       hysterectomy, oophorectomy and adjuvant chemotherapy.  5. She has had a ventral hernia repair.   FAMILY HISTORY:  Remarkable for no significant cardiopulmonary disease.   She is single.  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  She has a sister  who is with her today.  She does not use tobacco; she quit in 1970.   CURRENT MEDICINES:  1. Lisinopril/hydrochlorothiazide 20/25.  2. Iron.  3. Multivitamins.  4. Calcium.  5. Coumadin 7.5 a day, 10 mg twice a week.  Her INR today was 2.4.  6. Simvastatin 80 a day.   NKDA   EXAM:  Her exam is remarkable for a somewhat anxious white female who is  overweight; her weight is 264.  Her blood pressure is only 100/68, pulse  is 80 and regular, afebrile.  HEENT:  Unremarkable.  Carotids are normal without bruit.  No lymphadenopathy, thyromegaly or  JVP elevation.  LUNGS:  Clear.  Good diaphragmatic motion.  No wheezing.  S1-S2 with normal heart sounds.  No evidence cor pulmonale.  PMI not  palpable.  ABDOMEN:  Protuberant.  She has a midline scar from her ventral hernia  repair and ovarian cancer.  There is no hepatosplenomegaly or  hepatojugular reflux, no tenderness, no AAA, no bruit.  Distal pulses are intact.  She has some superficial varicosities and  spider veins behind the knee and on the forefoot.  She has some venous  insufficiency in the foot.  Pulses are +2 bilaterally.  There is no  evidence of dependent rubor or elevation pallor.  The leg is actually  not all that swollen, considering the proximity of a DVT with only trace  edema currently.   IMPRESSION:  1. Again, quite a bit of time was spent with the patient; I reviewed      over of 40 pages of records [facility].  In regards to the patient's deep venous thrombosis, she will      have a followup duplex in 3  months.  She will be seen in our      Coumadin clinic.  Her INR was therapeutic today.  2. In regards to her lower extremity edema and  spider veins, I told      her I would continue hydrochlorothiazide 25 a day and leave the      superficial system alone until the deep venous thrombosis is fully      matured and recanalized to see the extent of any abnormalities;      then these can be seen by the venous clinic.  3. Hypertension.  She is actually not very hypertensive.  I will stop      the lisinopril since it is probably contributing to her cough and      we will monitor her blood pressure at home.  4. Hair loss.  Follow up with dermatologist.  Her TSH and T4 were      checked in the hospital on were normal, no relation to deep venous      thrombosis.  5. Hypercholesterolemia.  Continue simvastatin.  Followup lipid and      liver profile in 6 months.  6. History of ovarian cancer, stage I.  I believe her CA-125 was      normal in the hospital.  Dr. [name] has retired and she      will need obstetrical/gynecological followup for this.   In short, I also told [name] that a lot of her problems have  nothing to do with my cardiovascular practice.  We are happy to follow  her in the Coumadin Clinic since her insurance covers our clinic and not  some of the other ones in town.  We will make a decision regarding  stopping her Coumadin and follow her deep venous thrombosis by duplex,  but a lot of these other problems need to be cared for by a primary care  doctor and she understands that.     Noralyn Pick. Eden Emms, MD, [facility]  Electronically Signed    PCN/MedQ  DD: 02/07/2007  DT: 02/08/2007  Job #: [id number]

## 2010-05-26 NOTE — Letter (Signed)
January 17, 2007   Benefits Office  [address]  [place], [place] [address]  Fax:  [phone]   Re:  [name], [name]             DOB:  26-Jun-1950   I saw the patient in the office today in consultation concerning [name] left  lower extremity DVT.  She had an extensive deep vein thrombosis  involving the left lower extremity, documented by duplex scan at [facility] on 12/19/2006 and then again on 01/01/2007.  Because of  the extensive nature of her DVT, she has been off her feet for some  time, and we are hoping to allow her to gradually begin to resume her  activity.  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  Cleta Alberts will make recommendations concerning her ability to return to work  in the future.   Di Kindle. Edilia Bo, M.D.  Electronically Signed   CSD/MEDQ  D:  01/17/2007  T:  01/18/2007  Job:  [id number]

## 2010-05-26 NOTE — Assessment & Plan Note (Signed)
[facility]                            CARDIOLOGY OFFICE NOTE   NAME:[name], [name]                    MRN:          [id number]  DATE:03/26/2008                            DOB:          November 04, 1950    Ms. Gradilla returns today for a followup.  She has had previous extensive  left-sided DVT in the setting of abdominal cancer.  She apparently is  cured from her abdominal cancer.  She is an 4-1/2-years survivor.  I  believe it was ovarian CA.   She continues her support hose in her left leg as she requires a low  dose of a diuretic.  Her left lower extremity is about the same as her  right now.  She continues to have issues at work.  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  She does not like to stand on her  feet all day long and I have encouraged her not to.  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  We have elected to keep her on chronic  Coumadin.  Given her risk of recurrent DVT, she has had extensive damage  to the venous system on the left.  Multiple serial duplexes have showed  recanalization, but persistent chronic clot.  Her most recent one was  done on December 2009 at the request of Dr. Cleta Alberts at [facility].  I guess there was a question of some recurrent swelling or pain.   The patient's INRs have been therapeutic.  She has had a bit of a  problem lately.  She was treated with some antibiotics for UTI and a  vaginal infection and her INR shot up to 4, and then dropped low again.  She understands the interaction with the antibiotics and the Coumadin.   REVIEW OF SYSTEMS:  Otherwise negative.  She has mild exertional  dyspnea.  There has been no diaphoresis, no weight loss.  There are no  palpitations, chest pain, PND, or orthopnea.   REVIEW OF SYSTEMS:  Otherwise  negative.   MEDICATIONS:  1. Lisinopril HCTZ 20/12.5 p.r.n.  2. Lasix 20 mg.  3. Multivitamins.  4. Calcium.  5. Vitamin E.  6. Coumadin as directed.  7. Simvastatin 80 a day.  8. Fish oil.  9. Niacin 500 a day.  10.Iron.   PHYSICAL EXAMINATION:  VITAL SIGNS:  Remarkable for weight of 262, blood  pressure 110/68, pulse 80 and regular, respiratory rate 14,  and  afebrile.  HEENT:  Unremarkable.  NECK:  Carotids are normal without bruit.  No lymphadenopathy,  thyromegaly, or JVP elevation.  LUNGS:  Clear.  Good diaphragmatic motion.  No wheezing.  CARDIAC:  S1 and S2.  Normal heart sounds.  PMI not palpable.  ABDOMEN:  Protuberant.  Bowel sounds positive.  No AAA.  No tenderness.  No bruit.  No hepatosplenomegaly.  No hepatojugular reflux.  EXTREMITIES:  Distal pulses are intact.  No  edema.  NEUROLOGIC:  Nonfocal.  SKIN:  Warm and dry.  MUSCULOSKELETAL:  No muscular weakness.  LOWER EXTREMITIES:  +1 edema on the left.  She has large leg.  She has  support hose with a 20-30 mm gradation on the left leg.  PTs are +2  bilaterally.   EKG shows sinus rhythm with poor R-wave progression, no acute changes.   IMPRESSION:  1. Previous deep vein thrombosis.  Continue chronic Coumadin.  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  2. Hypertension.  Currently, well controlled.  Continue lisinopril      HCTZ.  Low-sodium diet.  3. Previous ovarian cancer.  Follow up primary care MD.  CA-125      yearly.  Overall, I think she has made a nice recovery.  She will      continue to follow up in our Coumadin clinic.  She will follow up      her primary care needs with Dr. Cleta Alberts [facility].     Noralyn Pick. Eden Emms, MD, [facility]  Electronically Signed    PCN/MedQ  DD: 03/26/2008  DT: 03/27/2008  Job #: [id number]

## 2010-05-26 NOTE — Assessment & Plan Note (Signed)
[facility]                            CARDIOLOGY OFFICE NOTE   NAME:[name], Kathleen ROYAL                    MRN:          [id number]  DATE:09/25/2007                            DOB:          05-17-1950    [name] returns today for followup.  She has had previous extensive  left-sided DVT.  Followup duplex in June showed improved recanalization.  Her swelling has improved.  She is switched from Lasix to  hydrochlorothiazide.  Blood pressure is well controlled.  She is having  less pain.  She is wearing her graduated 20-30 mm below-knee stockings  at work.  She had a successful trip to [facility] with her sisters.  She  has less pain, of course she has some superficial varicosities now  particularly in the ankle.  Otherwise, she is doing well.  She has had  no evidence of increased dyspnea or pulmonary emboli.  Her blood  pressure is well controlled.  Risk factors are otherwise well modified  on simvastatin.   Current meds include lisinopril/hydrochlorothiazide 20/25,  multivitamins, simvastatin 80 a day, magnesium, fish oil, niacin, and  cetirizine.   Apparently, there has been some issues regarding calcium absorption.   PHYSICAL EXAMINATION:  VITAL SIGNS:  Unfortunately, her weight has gone  up from 262 to 279, blood pressure 120/80, pulse 80 and regular, and  respiratory rate 14, afebrile.  HEENT:  Unremarkable.  NECK:  Carotids normal without bruit.  No lymphadenopathy, thyromegaly,  or JVP elevation.  LUNGS:  Clear.  Good diaphragmatic motion.  No wheezing.  CARDIAC:  S1 and S2.  Normal heart sounds.  PMI distant and nonpalpable.  ABDOMEN:  Benign.  Bowel sounds positive.  No AAA, no tenderness, no  bruit, no hepatosplenomegaly, hepatojugular reflux, no tenderness.  EXTREMITIES:  Distal pulses are intact.  Superficial varicosities in the  left lower extremity.  Left lower extremity is slightly larger than the  right, but improved.  No  palpable mass in the popliteal fossa.  No  Homans sign.  No cords.   IMPRESSION:  1. Deep venous thrombosis, left lower extremity in the setting of      obesity.  Continue Coumadin therapy.  Follow up in the Coumadin      Clinic in a month.  Duplex in June showing improved recanalization,      decreased risk of phlegmasia, dull and less dependent edema.  2. Hypertension, currently well controlled.  Continue      lisinopril/hydrochlorothiazide.  3. Problems with calcium absorption.  Continue calcium and vitamin D.      Follow up primary care.  I talked to the patient at length.  She      had a myriad of questions regarding what she can do and not do.      She will wear her graduated stockings at work.  She will try not to      be on her feet more than 4 hours at the time.  I told her she could      drive for 2 hours at a time and then she would have to  get out and      walk around.  She will try to cut back on her salt      intake and be more active in terms of her weight gain.  She will      follow up with her primary care MD in regards to her calcium      homeostasis.  I will see her back in 6 months.     Noralyn Pick. Eden Emms, MD, [facility]  Electronically Signed    PCN/MedQ  DD: 09/25/2007  DT: 09/26/2007  Job #: [id number]

## 2010-05-26 NOTE — Discharge Summary (Signed)
[name], [name]             ACCOUNT NO.:  [id number]   MEDICAL RECORD NO.:  [id number]          PATIENT TYPE:  INP   LOCATION:  5703                         FACILITY:  [facility]   PHYSICIAN:  Leighton Roach McDiarmid, M.D.DATE OF BIRTH:  [birth year]   DATE OF ADMISSION:  12/19/2006  DATE OF DISCHARGE:  12/22/2006                               DISCHARGE SUMMARY   DISCHARGE DIAGNOSES:  1. Acute left lower extremity deep venous thrombosis extending to      femoral area.  2. Anemia of uncertain etiology.  3. History of ovarian cancer stage I status post total abdominal      hysterectomy and bilateral salpingo-oophorectomy.  4. Hypertension.  5. Hyperlipidemia.  6. History of basal cell carcinoma of the nose.  7. Hernia repair in 2007.   SIGNIFICANT FINDINGS:  1. Lower extremity Dopplers revealed DVT in the common femoral,      femoral, and popliteal veins.  Negative for DVT in the right lower      extremity.  Above described DVT was found in the left lower      extremity.  2. Transvaginal and transabdominal ultrasound showed no definable mass      or discrete adenopathy.  3. CT of the abdomen and pelvis showed possible cholelithiasis and      umbilical hernia containing loop of small bowel without obstruction      and extensive left iliofemoral DVT, sigmoid diverticulosis, and      normal appendix.  It was negative for pelvic mass or adenopathy.      V/Q scan was negative for PE.  Chest x-ray performed on December 20, 2006, showed cardiomegaly with central pulmonary vascular      prominence, left base subsegmental atelectasis, and a well-defined      segmental region of consolidation.  4. Blood cultures were negative.  5. CBC performed on admission showed white blood cell count of 9.8,      hemoglobin of 10.1, and platelet count of 276.  Hemoccult of stool      was negative.  PT and INR on admission were 14.4 and 1.1      respectively.  CBC on the day before admission, December 21, 2006,      showed white blood cell count of 6.6, hemoglobin of 8.3, and      platelet count of 270.  6. Anemia labs showed a total iron of 11, TIBC of 226, percent      saturation of 5.  Vitamin B12 was normal, folate was normal, and      reticulocyte count was 1.0.  TSH was 1.969.  Smear view was      unremarkable for the blood.  7. On the day of discharge, the patient's INR was 1.4, and the day      previous was 1.2.  CBC on the day of discharge showed white blood      cell count 5.9, hemoglobin of 8.7, and platelet count of 329.      Basic metabolic panel was unremarkable on the day of discharge.  Urine culture performed during hospitalization grew up multiple      bacteria of multiple types.  8. CA-125 was performed on this admission and was found to be within      normal limits of 6.6.   BRIEF HOSPITAL COURSE:  The patient is a 60 year old obese female with a  history of stage I ovarian cancer status post TAH-BSO with a normal CA-  125 on October 2008, who presented with acute swelling of pain in the  left lower extremity after 2 weeks of general malaise, low-grade fever,  fatigue, cough, and sinus congestion that had significantly decreased  her physical activity.  The patient was therefore admitted with an acute  left lower extremity DVT by a lower extremity Doppler ultrasound.  The  patient was begun on subcutaneous Lovenox injections for treatment of  this DVT and also started on Coumadin for bridge therapy.  Given the  patient's history of cancer, workup for recurrence of this cancer was  performed.  Abdominal and pelvic CT and ultrasounds were performed and  found to be negative for adenopathy or cancerous mass.  CA-125 was found  to be within normal limits during this hospitalization.  With these  results in mind, the patient's DVT was felt to be more idiopathic in  nature and not necessarily related to her history of malignancy.  Accordingly, the patient was felt  to be an appropriate candidate for  transition to Coumadin therapy.  During hospitalization, the patient had  a V/Q scan that was negative for PE.  While in the hospital, the patient  was found to be anemic and workup proved to be indeterminate for this  anemia.  On admission, her hemoglobin was 10.1, reaching a nadir of 8.3  the day prior to discharge.  On the discharge, the patient's hemoglobin  was 8.7 and stabilized.  Given the results of her iron panel, there was  no clear picture for her anemia.  Accordingly, the patient was started  on oral iron therapy on discharge with plans to follow up with her  primary care doctor, Dr. Juline Patch at [facility] with followup of  these results to assess whether or not the iron is giving her any  benefit with regards to her anemia.  During her hospital stay, the  patient had otherwise uncomplicated course.  She did have a low-grade  fevers reaching a high of 102.1 the day after admission and having other  fevers with a maximum of 99.9 degrees Fahrenheit; workup for this was  negative.  The patient did not render an increased white count during  her hospitalization and blood cultures were negative.  Urine culture was  obtained and showed multiple bacteria of multiple types.  UA was  unremarkable.  During her hospitalization, the patient did complain of  stable pain and tenderness and likely acute DVT.  However, there was no  acute change in this clinical presentation.  The patient complained of  some dyspnea on exertion that was stable throughout her hospitalization.  She did not have any acute worsening of shortness of breath and her  oxygen saturations remain normal throughout her hospitalization.  Again,  the patient's V/Q scan was normal during this admission.  On December 22, 2006, the patient was found to have stable hemoglobin, stable left  lower extremity swelling with diagnosed DVT, and was getting twice daily  subcutaneous Lovenox  injections at 120 mg with each injection.  The  patient was also being started on  Coumadin therapy.  On December 20, 2006, she received 10 mg x1; on December 21, 2006, she received 7.5 mg  x1; and on the day of discharge, she received 5 mg x1 and was discharged  on this dose with a close followup advised with Dr. Juline Patch at [facility].  On the day of discharge, her INR was 1.4; on admission, it  was 1.1; and on the day prior to discharge, it was 1.2.  On December 22, 2006, the patient voiced desire to be discharged to home.  Her clinical  status was stable.  She was receiving adequate anticoagulation therapy  with Lovenox as a bridge to eventual Coumadin therapy, and her vital  signs and clinical status was stable.  Given her negative cancer workup  in hospital and stable clinical status, the patient was deemed  appropriate for discharge on December 22, 2006.  She was discharged to  home with instructions to follow up closely with Dr. Juline Patch on Monday,  December 26, 2006, for INR recheck, CBC check, and further assessment by  Dr. Juline Patch.  The patient had received home health instructions with  Lovenox injections and was comfortable with administering injections  herself.   DISCHARGE MEDICATIONS:   CONTINUED MEDICATIONS:  1. Lisinopril/HCTZ 20/25 mg once daily.  2. Zocor 80 mg once daily.  3. Calcium.  4. Vitamins.   NEW MEDICATIONS:  1. Lovenox 120 mg subcutaneous injections b.i.d.  2. Coumadin 5 mg p.o. once daily.  3. Ferrous sulfate 325 mg 2-3 times per day without food for better      absorption.   DISCHARGE INSTRUCTIONS:  1. The patient is take to medications as mentioned previously.  2. The patient is to follow up with Dr. Juline Patch on Monday for PT and INR      recheck in further assessment of her anticoagulation therapy per      his instructions.  The patient will be on day #8 of Coumadin      therapy on return to clinic on Monday next week.  She is to      maintain  twice a day Lovenox injections over the course over this      time and take further instructions from Dr. Juline Patch regarding her      anticoagulation.  3. The patient is to wear compression stockings with 30 mmHg of      pressure while out of bed for the indefinite future while treating      her lower extremity DVT.  A script was given to the patient for      these stockings.  4. The patient is to come to the emergency room or see Dr. Juline Patch for      any acute shortness of breath, bleeding, increased pain or swelling      in her legs, or for any other concerning symptoms.  The patient has      expressed agreement and understanding with these instructions.  5. The patient is to follow up with Dr. Juline Patch regarding her anemia per      his instructions.   Issues for further consideration pending at the time of discharge.  The  patient's anemia workup was indeterminate at this hospitalization.  Accordingly, the patient was given oral iron therapy.  I instructed to  take 1 pill 2 to 3 times per day without food for better absorption.  We  would recommend that Dr. Juline Patch reassess the benefit of this therapy by  obtaining  CBC and reticulocyte cell count at some point in the near  future and adjust this therapy accordingly.   DISCHARGE CONDITION:  Stable.   DISPOSITION:  The patient was discharged to home with instructions to  follow up on Monday after discharge on December 26, 2006.  The patient  expresses agreement and understanding with these instructions.      Myrtie Soman, MD  Electronically Signed      Leighton Roach McDiarmid, M.D.  Electronically Signed    TE/MEDQ  D:  12/22/2006  T:  12/22/2006  Job:  [id number]

## 2010-05-26 NOTE — Consult Note (Signed)
[name], [name]             ACCOUNT NO.:  [id number]   MEDICAL RECORD NO.:  [id number]          PATIENT TYPE:  OUT   LOCATION:  GYN                          FACILITY:  [facility]   PHYSICIAN:  Paola A. 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  Her CA-125  preoperatively was 223.  She underwent three cycles of postoperative  chemotherapy with paclitaxel and carboplatin with her last cycle being  in August of 2005 and has had no evidence of recurrent disease since  that time.  She most recently had imaging in March of 2007 which was  essentially negative.  She had a CA-125 in April of 2009 with Dr.  Darnelle Catalan that was normal at 7.4.  She overall is doing quite well and  really denies any significant complaints.  When I saw her last year she  was complaining of some intermittent right lower quadrant pain.  Since  that time that discomfort has completely abated itself.  She denies any  abdominal pain, any change in bowel or bladder habits, any nausea,  vomiting, fevers, chills, headaches, visual changes.  She denies any  early satiety, change in bowel or bladder habits.   MEDICATION LIST:  1. Includes lisinopril 20 mg plus HCTZ 25 mg one a day.  2. Calcium with vitamin D.  3. Vitamin E.  4. Zyrtec.  5. Fish oil.  6. Coumadin 7.5 daily with 10 mg on Saturday.  7. Simvastatin 80 mg daily.  8. Iron.  9. Niacin.  10.Triamcinolone p.r.n.  11.Lovenox p.r.n.  12.MetroLotion for rosacea p.r.n.  13.Lasix p.r.n.   PHYSICAL EXAMINATION:  VITAL SIGNS:  Weight 282 pounds which is up 5  pounds since last year, blood pressure 101/52.  GENERAL:  A well-nourished, well-developed female in no acute distress.  NECK:  Supple.  There is no lymphadenopathy, no  thyromegaly.  LUNGS:  Clear to auscultation bilaterally.  CARDIOVASCULAR:  Regular rate and rhythm.  ABDOMEN:  Shows a well-healed vertical midline incision.  Abdomen is  morbidly obese, soft, nontender, nondistended.  There are no palpable  masses or hepatosplenomegaly.  Groins are negative for adenopathy.  EXTREMITIES:  She has got a compression stocking on her left leg.  PELVIC:  External genitalia is within normal limits.  The vagina is  atrophic.  The vaginal cuff is visualized.  There are no visible  lesions.  ThinPrep Pap was submitted without difficulty.  Bimanual  examination reveals no masses or nodularity.  Exam is limited by  habitus.  RECTAL:  Rectal confirms.   ASSESSMENT:  A 60 year old with a stage IA papillary serous carcinoma of  the ovary diagnosed and treated in 2005 who has no evidence of recurrent  disease.  It will be her 5 year anniversary in 6 months when she sees  Dr. Darnelle Catalan next.   PLAN:  1. Will follow up  on results for Pap smear from today as well as her      CA-125.  If all is well she will return to see Dr. Darnelle Catalan in 6      months.  At that time she will be 5 years after the time of her      diagnosis and I believe she can have annual visits with routine      gynecology.  2. She will continue following up with Dr. Eden Emms with regards to her      Coumadin which they believe will be long-term.  She knows that once      we discharge we will be happy to see her in the future should the      need arise.  She should continue having CA-125 done at least on      annual basis.      Paola A. Duard Brady, MD  Electronically Signed     PAG/MEDQ  D:  10/24/2007  T:  10/24/2007  Job:  [id number]   cc:   Telford Nab, R.N.  [address]  [place], [place] [address]   Anselmo Rod, M.D.  Fax: [phone]   Thomes Lolling C. Eden Emms, MD, [facility]  [address]  [address]  [place]  [place] [address]   Valentino Hue. Magrinat, M.D.  Fax:  [phone]

## 2010-05-26 NOTE — Consult Note (Signed)
[name], [name]             ACCOUNT NO.:  [id number]   MEDICAL RECORD NO.:  [id number]          PATIENT TYPE:  OUT   LOCATION:  GYN                          FACILITY:  [facility]   PHYSICIAN:  Paola A. Duard Brady, MD    DATE OF BIRTH:  07-24-1950   DATE OF CONSULTATION:  10/26/2006  DATE OF DISCHARGE:                                 CONSULTATION   The patient is a very pleasant 60 year old gravida 0 who underwent TAH-  BSO and appropriate staging May 13, 2003 for stage 1a papillary serous  carcinoma of the ovary.  Her preoperative CA-125 was 223.  She underwent  three cycles of postoperative chemotherapy with Taxol and carboplatin,  with the last cycle being in August of 2005 and has been free of disease  since that time.  She did have a CT scan of the abdomen and pelvis in  March of 2007 that showed a ventral hernia, a few diverticula, with no  evidence recurrent disease.  Her most recent CA-125 was on October 19, 2006 and was 7.  She is otherwise doing quite well and denies any  significant complaints, with the exception of some right lower quadrant  pain which is intermittent.  She thought it was related to her bowels.  Most recently, however, she did a few cancer walks and noted that the  pain was related to exercise.  This most recent episode she had lasted  for about an hour while she was walking for about two and a half miles.  The pain never wakes her up at night, is not getting worse, except for  this one episode that lasted about an hour.  She denies any nausea,  vomiting, early satiety, abdominal bloating.  She is frustrated with her  weight loss efforts in that they are not going as well as she would like  them to go.  She denies any vaginal bleeding.  She did have an issue  with alopecia.  She was seen by dermatologist and had some steroid  injections into the area of alopecia and is currently on Rogaine.   HEALTH MAINTENANCE:  Her mammograms are up to date.   PHYSICAL  EXAMINATION:  VITAL SIGNS:  Weight 277 pounds, which is down  from 283 pounds.  GENERAL:  Well-nourished, well-developed female in no acute distress.  NECK:  Supple with no lymphadenopathy, no thyromegaly.  LUNGS:  Clear to auscultation bilaterally.  CARDIOVASCULAR:  Regular rate and rhythm.  BREASTS:  Symmetrical.  There is no palpable mass.  No skin changes or  nipple discharge.  No axillary adenopathy.  ABDOMEN:  Well-healed vertical skin incision.  There is no evidence of  an incisional hernia.  Abdomen is soft, nontender, nondistended.  No  palpable masses or hepatosplenomegaly though exam is limited by habitus.  Groins are negative for adenopathy.  EXTREMITIES:  She has 1+ nonpitting  edema equal bilaterally.  PELVIC:  External genitalia is within normal limits.  Bimanual  examination reveals no masses or nodularity.  Rectal confirms.   ASSESSMENT:  56-year with stage 1a papillary serous carcinoma of the  ovary  who has no evidence of recurrent disease.  Her diagnosis was 3  years ago.   PLAN:  She had normal CA-125 and will return to see [place] in 1 year with an  interval visit with Dr. Darnelle Catalan in 6 months.  I did discuss this  episode of discomfort with her.  If it persists or gets worse, she was  encouraged to contact either our office or that of Dr. Darnelle Catalan,  and we  will schedule her for a CT scan of the abdomen and pelvis.      Paola A. Duard Brady, MD  Electronically Signed     PAG/MEDQ  D:  10/26/2006  T:  10/27/2006  Job:  [id number]   cc:   Rolanda Jay, M.D.  Fax: [phone]   Valentino Hue. Magrinat, M.D.  Fax: [phone]   JOACZY SAY TKZS, M.D.  Fax: [phone]   Jacquelyne Balint, R.N.  [phone] [address]  [place], [place] [address]

## 2010-05-29 NOTE — Op Note (Signed)
[name], [name]                       ACCOUNT NO.:  [id number]   MEDICAL RECORD NO.:  [id number]                   PATIENT TYPE:  INP   LOCATION:  NA                                   FACILITY:  [facility]   PHYSICIAN:  Pershing Cox, M.D.            DATE OF BIRTH:  02/26/1950   DATE OF PROCEDURE:  05/13/2003  DATE OF DISCHARGE:                                 OPERATIVE REPORT   PREOPERATIVE DIAGNOSIS:  Pelvic abdominal mass and abdominal pain.   POSTOPERATIVE DIAGNOSIS:  Endometrial ovarian carcinoma.   OPERATION/PROCEDURE:  1. Exploratory laparotomy.  2. Total abdominal hysterectomy.  3. Bilateral salpingo-oophorectomy.  4. Pelvic lymphadenectomy.  5. Peritoneal biopsies.  6. Omentectomy.   ANESTHESIA:  General endotracheal anesthesia.   SURGEON:  Pershing Cox, M.D.   ASSISTANT:  Lenoard Aden, M.D.  Genia Del, M.D.   INDICATIONS FOR PROCEDURE:  The patient is a 60 year old female who has a  two-month history of abdominal pain.  Work-up at Urgent Care showed a  complex pelvic mass and after colonoscopy on Friday, the patient is brought  to the operating room today for exploratory laparotomy and diagnostic  procedure.  The patient's CA125 was elevated at 223.   FINDINGS:  There was approximately 100-150 mL brown ascites aspirated at the  beginning of case.  There was a midline pelvic mass approximately 18-20 cm  in size arising from the left ovary.  The uterus itself was small.  The  right fallopian tube and ovary were normal.  There was no evidence of  metastases either to the lymph nodes or to peritoneal surfaces.  The omentum  itself was normal.   DESCRIPTION OF PROCEDURE:  [name] was brought to the operating room  with an IV placed.  She received 1 g of Ancef in the holding area and PAS  stockings, thigh-high, had been placed on her lower extremities.  She was  placed supine on the OR table and general endotracheal anesthesia  was  established without difficulty.  She was placed in the frog-leg position.  The anterior abdominal wall, peritoneum and vagina were prepped with the  Hibiclens solution and Foley catheter was sterilely inserted into the  patient's bladder.  The patient was draped for sterile midline incision.  A  0.25% Marcaine was injected into the abdominal incision primarily  subumbilical but extending to the right of the patient's umbilicus.  Skin  was marked for the patient's skin incision.   Skin was incised below the umbilicus.  The incision was extended to the  right of the umbilicus.  This was taken down through the subcutaneous  tissues until the fascia was encountered.  Fascia was opened carefully to  avoid injury to the underlying ovary.  Peritoneum was tented and opened  atraumatically.  There was a moderate amount of ascites aspirated.  Once the  peritoneal incision had been extended, another 300-400 mL of saline were  instilled into the upper abdomen and washings were obtained from this area  as well.   The mass was lifted out of the abdomen.  Before retractors were placed, I  placed a clamp along the utero-ovarian ligament.  I was then able to  skeletonize the infundibulopelvic ligament and clamp this ligament.  It was  cut and doubly ligated.  Later in the case, the ligament was reexposed,  reclamped, sutured, free-tied and ligated to insure that there was no  evidence of injury.  Also later in the case, the ureter on the left side was  carefully palpated.  This was far below the infundibulopelvic ligament. Once  the infundibulopelvic ligament and the utero-ovarian ligament had been  transected, the mass was lifted out of the abdominal cavity and sent for  frozen section, it later returned as consistent with an endometrial ovarian  carcinoma.  Hysterectomy was performed in the standard procedure.  The round  ligaments were identified and suture ligated and transected with cautery.   We extended the peritoneal incision across the lower uterine segment,  lifting the bladder off the cervix and lower uterine segment and then using  sharp dissection to dissect it from the lower cervix.  The infundibulopelvic  ligament was identified on the patient's right, skeletonized, clamped, cut,  suture, and free-tied, and ligated.  The ovary was brought up onto a clamp  which was holding the right adnexa along the uterine surfaces.  The uterine  arteries were skeletonized bilaterally and doubly clamped, suture ligated  with a 0 Vicryl suture.  The lower uterine segment and cervix were separated  from the cardinal ligament using straight clamps and Heaney technique with  suture ligatures of 0 Vicryl.  The uterosacral ligaments were individually  clamped, cut, suture free-tie ligated and then later in the case, tied to  the vaginal apex for support of the uterus.  We entered the patient's vagina  on the right and the uterus was then removed from the upper vagina and sent  for permanent section.  Angle sutures were placed on both sides of the  vagina.  The vagina was then run with a 0 Vicryl suture and then closed  front to back with two interrupted sutures.   It should be mentioned at this time that all through the case, the patient  bled from all cut surfaces.  Her bleeding time was slightly elevated at the  beginning of the procedure because she had been on nonsteroidal anti-  inflammatories and also an aspirin which were only stopped three days ago  prior to the procedure.  She was able to clot these areas but her blood loss  was more than expected because of this constant ooze from cut surfaces.   With the information regarding the patient's malignancy, decision was made  to do a pelvic lymphadenectomy.  I started on the patient's left.  The  sidewall was opened and peritoneal tissue was taken from the lower left pelvic sidewall and submitted for peritoneal biopsy.  Next, the  external  iliac artery was skeletonized and lymph nodes, both laterally and on the  surface were removed.  They were separated from the vein and lymph nodes  along the vein and the hypogastric artery were gathered.  Where appropriate,  clips were used to limit the bleeding.  There was no injury to any of the  vessels, vein or artery.  The dissection was identical on the patient's  right except that a peritoneal biopsy was not  taken from this area of the  pelvis.  Later in the case, a peritoneal biopsy was taken from the surfaces  of the right gutter and also from the peritoneal surface of the distal ileum  mesentery.  Hemostasis was obtained and there was no evidence of active  bleeding.  On the right the artery and vein were separated.  All the lymph  nodes lying along both surfaces were removed without injury to underlying  vessels.   The patient's omentum was drawn down off the colon and separated from the  colon by cautery.  Using LDS stapler, the omentum was completely removed.   Moist laps were removed from the abdominal cavity.  The Buchwalter had been  used throughout the case to retract the walls once the ovary had been  removed.  Walls were inspected.  The bowel was removed from the ligament of  Treitz to the ileocecal valve and there was no evidence of implant.  The  only biopsy was on the distal ileum and I believe that this was probably an  area of suspicion only because of retractors that retracted it throughout  the case.   All the laps were removed.  The bowel was then layered back into the  peritoneal cavity and the colon was drawn over its top.  The fascia was  identified and closed with a running double-strand 0 Vicryl suture.  This  started at the top, was tied in the middle and then buried.  Subcutaneous  tissues were irrigated and made hemostatic.  Skin staples were applied.   Estimated blood loss was 500 mL.  Urine output was 250 mL.  Fluid was 4300  mL.  No  complications.                                               Pershing Cox, M.D.    MAJ/MEDQ  D:  05/13/2003  T:  05/13/2003  Job:  [id number]   cc:   Lenoard Aden, M.D.  [address]  [place]  [place] [address]  Fax: [phone]   Genia Del, M.D.  [address]  [place]  [place] [address]  Fax: [phone]   Stan Head. Cleta Alberts, M.D.  [address]  [place]  [place] [address]  Fax: [phone]

## 2010-05-29 NOTE — Op Note (Signed)
[name], [name]                       ACCOUNT NO.:  [id number]   MEDICAL RECORD NO.:  [id number]                   PATIENT TYPE:  AMB   LOCATION:  ENDO                                 FACILITY:  [facility]   PHYSICIAN:  Anselmo Rod, M.D.               DATE OF BIRTH:  November 28, 1950   DATE OF PROCEDURE:  05/10/2003  DATE OF DISCHARGE:                                 OPERATIVE REPORT   PROCEDURE:  Colonoscopy with snare polypectomy x 2 and cold biopsies x 3.   ENDOSCOPIST:  Charna Elizabeth, M.D.   INSTRUMENT USED:  Olympus video colonoscope.   INDICATIONS FOR PROCEDURE:  60 year old white female with a family history  of colon cancer in her father and a personal history of polyps with a recent  change in bowel habits and a large 14 by 17 cm right ovarian neoplasm  awaiting surgery on May 13, 2003, by Dr. [name], rule out recurrent  polyps, masses, etc.   PREPROCEDURE PREPARATION:  Informed consent was procured from the patient.  The patient was fasted for eight hours prior to the procedure and prepped  with a bottle of magnesium citrate and a gallon of GoLYTELY the night prior  to the procedure.   PREPROCEDURE PHYSICAL:  Patient with stable vital signs.  Neck supple.  Chest clear to auscultation.  S1 and S2 regular.  Abdomen soft with normal  bowel sounds.   DESCRIPTION OF PROCEDURE:  The patient was placed in the left lateral  decubitus position, sedated with 50 mg of Demerol and 5 mg Versed  intravenously.  Once the patient was adequately sedated, maintained on low  flow oxygen and continuous cardiac monitoring, the Olympus video colonoscope  was advanced into the rectum to the cecum.  The appendiceal orifice and  ileocecal valve were clearly visualized and photographed.  There was  extensive sigmoid diverticulosis with a few diverticula in the transverse  colon.  Two small sessile polyps were snared in the rectosigmoid colon.  Two  small sessile polyps were also  biopsied x 3 from the rectosigmoid colon.  Retroflexion in the rectum revealed no abnormalities.  The patient tolerated  the procedure well without complications.   IMPRESSION:  1. Extensive sigmoid diverticulosis with a few scattered diverticula in the     transverse colon.  2. Normal appearing cecum and right colon.  3. Two small sessile polyps snared from the rectosigmoid colon, two small     sessile polyps biopsied from the rectosigmoid colon.   RECOMMENDATIONS:  1. Await pathology results.  2. Avoid all nonsteroidals including aspirin for the next four weeks.  3. Repeat CRC screening depending on pathology results.  4. Proceed with surgery on as advised by Dr. [name] on May 13, 2003.  Anselmo Rod, M.D.    JNM/MEDQ  D:  05/10/2003  T:  05/10/2003  Job:  [id number]   cc:   Pershing Cox, M.D.  [address]  [place]  [place] [address]  Fax: [phone]

## 2010-05-29 NOTE — Op Note (Signed)
[name], [name]             ACCOUNT NO.:  [id number]   MEDICAL RECORD NO.:  [id number]          PATIENT TYPE:  AMB   LOCATION:  DAY                          FACILITY:  [facility]   PHYSICIAN:  Lebron Conners, M.D.   DATE OF BIRTH:  1950/03/21   DATE OF PROCEDURE:  07/26/2005  DATE OF DISCHARGE:                                 OPERATIVE REPORT   PREOPERATIVE DIAGNOSIS:  Incisional hernia.   POSTOPERATIVE DIAGNOSIS:  Incisional hernia.   OPERATION:  Repair of incisional hernia.   SURGEON:  Dr. Lebron Conners.   ANESTHESIA:  General.   ESTIMATED BLOOD LOSS:  Minimal blood loss.   COMPLICATIONS:  None.   SPECIMEN:  None.   CONDITION:  To PACU good.   PROCEDURE:  After the patient was monitored and anesthetized and had routine  preparation and draping of the abdomen, I opened the upper part of the lower  midline incision which came up just slightly around the umbilicus and  extended that for a few centimeters cephalad.  I eventually opened the  incision a bit farther in each direction.  I dissected down through the fat  staying close to the umbilical skin and encountered a hernia.  I dissected  the hernia sac and its contents away from the surrounding normal fat and  found that this was an incisional hernia involving the superior end of the  midline incision.  I dissected a little bit laterally on the fascia in all  directions and found that it was a fairly small hernia.  I then entered the  sac and dissected everything away debriding the attenuated fascia and  removing some ineffective stretched suture.  I then dissected the small  bowel and omentum away from the edges as they were somewhat adherent there.  I made one small seromuscular injury of the small intestine and repaired  that with two sutures of 3-0 Vicryl.  I then palpated down the midline and  found that the incision, except for the extreme cephalad end, was quite  strong and intact and well-healed.  I did not  feel I needed to open the skin  for the entire length of the incision for that reason.  I then closed the  defect which was present after I had debrided the attenuated fascia using  running #1 PDS suture.  I dissected laterally in all directions for 3-4 cm  beyond the repair and then trimmed a piece of prominent polypropylene mesh  to approximately 3 x 5 inches rounded at that the ends and I sutured that in  on the anterior surface of the fascia with running basting 2-0 Prolene  suture and tacked it down in the middle with a couple of 2-0 Prolene  stitches.  Hemostasis was good.  I placed a 10 mm suction drain through a  separate stab incision and made sure that lay comfortably on top of the  mesh.  I closed the deep subcutaneous tissues down to the mesh with a  running 2-0 Vicryl stitch.  I closed the skin with staples.  The drain made  a good seal.  The  patient tolerated the operation well.      Lebron Conners, M.D.  Electronically Signed     WB/MEDQ  D:  07/26/2005  T:  07/26/2005  Job:  [id number]   cc:   Pershing Cox, M.D.  Fax: [phone]

## 2010-05-29 NOTE — Discharge Summary (Signed)
[name], [name]                       ACCOUNT NO.:  [id number]   MEDICAL RECORD NO.:  [id number]                   PATIENT TYPE:  INP   LOCATION:  0440                                 FACILITY:  [facility]   PHYSICIAN:  Pershing Cox, M.D.            DATE OF BIRTH:  [birth year]   DATE OF ADMISSION:  05/13/2003  DATE OF DISCHARGE:  05/17/2003                                 DISCHARGE SUMMARY   ADMISSION DIAGNOSIS:  Abdominal pain, pelvic abdominal mass.   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   For details of the patient's admission and history and physical, please see  the transcribed note dated May 13, 2003.  The patient underwent a colonoscopy  prior to her admission for surgery, and several small polyps were removed  and subsequently returned as benign polyps.   On the day of admission, the patient was taken to the operating room prior  to admission.  Under general anesthesia, exploratory laparotomy, TAH/BSO,  pelvic lymphadenectomy, omentectomy, and peritoneal biopsies were performed.  Frozen section on the patient's ovarian mass returned as an endometrioid  ovarian carcinoma, and for that reason, appropriate staging was performed.  There were no intraoperative complications.  Patient was stable  postoperatively and was able to be admitted to the floor from the recovery  room.  On the evening of her surgery, her pain was well controlled with PCA.  Her urine output was sluggish but adequate over the evening.   On the morning of postoperative day #1, she required several bolus runs in  order to maintain a urine output of 30 cc/hr.  On the morning of  postoperative day #2, she had some nausea when sitting.  She was quite  hypotensive but not tachycardic.  Hemoglobin was 9.6.  Platelet count was  stable.  The patient was treated with conservative management, receiving  only bolused therapy to maintain her urine output.  On the  morning of  postoperative day #1, her pressure was a little bit better.  Her urine  output had picked up.  On postoperative day #1, the patient was begun on  Lovenox per pharmacy protocol.  This was started because of her obesity and  her malignancy.  She was out of bed, moving, and was wearing thigh-high PAS  stockings.   On the morning of postoperative day #1, she had a temperature spike at night  to 101.9.  Her white count was normal.  Her O2 sat dropped considerably into  the 80s.  She was cultured and Lovenox was started.  She had a chest x-ray,  which could not rule out pulmonary embolus, so a spiral CT was performed,  which showed no evidence of embolus.  She was treated with vigorous chest PT  and seemed to resolve the problem.  Her only complaint on the evening of  postoperative day #2 was gas pain, and she was treated  with Dulcolax  suppositories.   On the morning of postoperative day #3, the patient has had some flatus and  was feeling a little bit better.  That evening, she was still complaining of  gas pain and was given another Dulcolax suppository, which resulted in a  bowel movement.  On the afternoon of postoperative day #2, because of the  patient's fever, her wound had been probed, and there was moderate seroma.  The wound stopped draining after several hours, but because of this seroma,  she was started on antibiotics, Ceftin 500 mg b.i.d., which she was  prescribed for 5 days to continue after discharge.  On the morning of  postoperative day #4, she was continuing to have gas pains but was able to  eat a regular diet.  Her white count was down at 3.3, but the remainder of  her labs were normal.  She was afebrile.  She was able to take lunch.  She  was seen by [facility] to help manage her Lovenox at home and was  able to be discharged on the afternoon of May 17, 2003.   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   DISCHARGE  PRESCRIPTIONS:  1. Lovenox to be taken for 2 weeks.  2. Darvocet for pain.  3. Percocet for pain.  4. Peri-Colace and iron.  5. Ceftin 500 mg b.i.d. x5 days.  6. She was also given a prescription for Diflucan to take at the completion     of the use of the Ceftin therapy.   She will be seen in my office to have her staples removed on Monday and will  be followed both in my office and by Dr. Jeanette Caprice from oncology.                                               Pershing Cox, M.D.    MAJ/MEDQ  D:  05/23/2003  T:  05/23/2003  Job:  [id number]   cc:   Valentino Hue. Magrinat, M.D.  [address] [facility]  [place]  [place] [address]  Fax: [phone]

## 2010-05-29 NOTE — Op Note (Signed)
[facility]  Patient:    [name], [name]                    MRN: [id number] Proc. Date: 05/19/00 Adm. Date:  [id number] Attending:  Charna Elizabeth CC:         Dr. Robert Bellow, [facility], [address]   Operative Report  DATE OF BIRTH:  1950/05/03.  PROCEDURE:  Colonoscopy with snare polypectomy x 2.  ENDOSCOPIST:  Anselmo Rod, M.D.  INSTRUMENTS USED:  Olympus video colonoscope.  INDICATIONS:  Rectal bleeding in a 60 year old white female with a family history of colon cancer.  The patients mother has a history of Crohns disease.  PREPROCEDURE PREPARATION:  Informed consent was procured from the patient. The patient was fasted for eight hours prior to the procedure and prepped with a bottle of magnesium citrate and a gallon of NuLytely the night prior to the procedure.  PREPROCEDURE PHYSICAL EXAMINATION:  VITAL SIGNS:  The patient had stable vital signs.  NECK:  Supple.  CHEST:  Clear to auscultation, S1, S2 regular.  ABDOMEN:  Soft with normal abdominal bowel sounds.  No hepatosplenomegaly, no masses palpable.  DESCRIPTION OF PROCEDURE:  The patient was placed in the left lateral decubitus position and sedated with 75 mg of Demerol and 7.5 mg of Versed intravenously.  Once the patient was adequately sedated and maintained on low-flow oxygen and continuous cardiac monitoring, the Olympus video colonoscope was advanced from the rectum to the cecum with difficulty secondary to a large amount of residual stool in the colon.  Two sessile polyps were snared from the rectum.  There were a few left-sided diverticula. A small sessile polyp was also ablated in the rectum with the tip of the snare.  The patient tolerated the procedure well.  Very small lesions could have been missed secondary to some residual stool in the colon.  The procedure was complete up to the cecum.  The appendiceal orifice and the ileocecal valve were clearly  visualized and photographed.  IMPRESSION: 1. Significant amount of residual stool in the colon.  A very small lesion may    have been missed. 2. A few left-sided diverticula. 3. Two small sessile polyps removed from the rectum. 4. One smaller polyp ablated in the rectum.  RECOMMENDATIONS: 1. Await pathology results. 2. Increase fluids and fiber in diet. 3. Outpatient followup. DD:  05/19/00 TD:  05/20/00 Job: [id number]

## 2010-05-29 NOTE — Consult Note (Signed)
[name], [name]             ACCOUNT NO.:  [id number]   MEDICAL RECORD NO.:  [id number]          PATIENT TYPE:  OUT   LOCATION:  GYN                          FACILITY:  [facility]   PHYSICIAN:  Paola A. Duard Brady, MD    DATE OF BIRTH:  1950-09-30   DATE OF CONSULTATION:  DATE OF DISCHARGE:                                 CONSULTATION   REFERRING PHYSICIAN:  Valentino Hue. Magrinat, M.D.   The patient is seen today in consultation at the request of Dr.  Darnelle Catalan.  Ms. Goodness is a 60 year old gravida 0 who underwent surgery  and appropriate staging consisting of a TAH/BSO, pelvic lymphadenectomy,  peritoneal biopsies and omentectomy by Dr. 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  Her preoperative CA-125  was 223.  She underwent three cycles of Taxol and carboplatin with the  last cycle being in August 2005.  She has been free of disease.  She has  been followed by Dr. Darnelle Catalan as recently as October 2007, at which time  her CA-125 was 10.  Dr. Carey Bullocks is on leave; therefore, we were asked to  see the patient today.  She is overall doing fairly well.  Her last  chest x-ray was July 2007, which was negative.  She had a CT scan of the  abdomen and pelvis in March 2007 that revealed a stable ventral hernia,  a few diverticula, and no evidence of recurrent disease.  She had a CA-  125 December 28, which was normal at 6.4.   The patient comes in today for follow-up.  She is overall doing quite  well.  She did undergo a hernia repair in July 2007.  She states her  weight has gone up.  She has been overeating and has probably gained  about 20-25 pounds since 2005 but no acute weight gain.  She denies any  chest pain, shortness of breath, nausea, vomiting, fevers, chills,  headache, visual changes, change in bowel or bladder habits, or early  satiety.   PAST MEDICAL HISTORY:  1. Basal cell carcinoma of the nose.  2. Hypertension.  3. Ovarian cancer.  4. Hernia  repair.   PAST SURGICAL HISTORY:  1. July 2007, hernia repair.  2. Basal cell carcinoma resection with plastic surgery reconstruction      in summer of 2007.  3. Ovarian cancer surgery as above in 2005.  4. Bilateral cataract surgery.  5. Bilateral benign breast mass from the left breast in 1970.   MEDICATIONS:  Lisinopril, hydrochlorothiazide, simvastatin, baby  aspirin, calcium with D, vitamin E.   ALLERGIES:  SULFA, which causes a rash.  BENOXINATE causes red eyes.   SOCIAL HISTORY:  She denies use of tobacco or alcohol.  She is single.  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   FAMILY HISTORY:  Both her parents had Alzheimer's and hypertension.  Her  father had colon cancer in his 36s.  She has sisters with benign GYN  disease and have undergone hysterectomy.  She has one niece who  developed breast cancer at the age  of 32.  She had genetic testing that  showed non-deleterious mutations of the BRCA gene.  The patient has also  undergone genetic testing.  The results are not available to me.   HEALTH MAINTENANCE:  She is up to date on her mammograms.  She is due in  March 2008.  She is up to date on her colonoscopy.   PHYSICAL EXAMINATION:  VITAL SIGNS:  Height 5 feet 7 inches, weight 283  pounds.  Blood pressure 138/88, pulse 96, respirations 18.  GENERAL:  A well-nourished, well-developed female in no acute distress.  NECK:  Supple.  There is no lymphadenopathy, no thyromegaly.  LUNGS:  Clear to auscultation bilaterally.  CARDIOVASCULAR:  Regular rate and rhythm.  BREASTS:  She has a seborrheic keratosis on her right breast.  There is  no palpable mass and no skin changes or nipple discharge.  No axillary  adenopathy.  ABDOMEN:  Obese.  She has a well-healed vertical skin incision.  There  is no evidence of incisional hernia.  Abdomen is soft, nontender,  nondistended.  There is no palpable mass or hepatosplenomegaly.  Groins  are negative for adenopathy.   EXTREMITIES:  There is no edema.  PELVIC:  External genitalia is within normal limits.  Bimanual  examination reveals no masses or nodularity.  Rectal confirms.   ASSESSMENT:  A 60 year old with stage I-A papillary serous carcinoma of  the ovary, who had a preoperative CA-125 that was elevated at 223 and  comes in today for follow-up.  Clinically she has no evidence of  recurrent disease.   PLAN:  She will return to see [place] in 8 months and will be seen by Dr.  Darnelle Catalan in 4 months.  We will continue this until she is 3 years out,  at which her follow-up will be every 6 months, alternating between  ourselves and Dr.  Darnelle Catalan.  If Dr. Carey Bullocks returns to practice, the patient will at that  point decide whether she wants to follow with [place] or return to Dr.  Carey Bullocks.  Her questions were answered to her satisfaction.  She was  given our card.  She knows to contact us if she has any questions or  concerns.      Paola A. Duard Brady, MD  Electronically Signed     PAG/MEDQ  D:  01/12/2006  T:  01/12/2006  Job:  [id number]   cc:   Tracey Harries, M.D.  Fax: [phone]   Pershing Cox, M.D.  Fax: [phone]   GNFAOZ HYQ MVHQ, M.D.  Fax: [phone]   Honor Loh, M.D.   Valentino Hue. Magrinat, M.D.  Fax: [phone]   Telford Nab, R.N.  [address]  [place], [place] [address]

## 2010-06-14 ENCOUNTER — Other Ambulatory Visit: Payer: Self-pay | Admitting: Cardiology

## 2010-06-25 ENCOUNTER — Encounter: Payer: Self-pay | Admitting: *Deleted

## 2010-06-25 ENCOUNTER — Telehealth: Payer: Self-pay | Admitting: Cardiovascular Disease

## 2010-06-25 NOTE — Telephone Encounter (Signed)
Spoke with pt, letter will be faxed to the number providied and a caopy mailed to pt home [employer]

## 2010-06-25 NOTE — Telephone Encounter (Signed)
Pt called up for jury duty. Pt need a note to come out of jury duty basis on her leg condition.  Fax # [phone]. attention lisa simmonds.

## 2010-06-29 ENCOUNTER — Ambulatory Visit (INDEPENDENT_AMBULATORY_CARE_PROVIDER_SITE_OTHER): Payer: BC Managed Care – PPO | Admitting: *Deleted

## 2010-06-29 DIAGNOSIS — I82409 Acute embolism and thrombosis of unspecified deep veins of unspecified lower extremity: Secondary | ICD-10-CM

## 2010-07-27 ENCOUNTER — Ambulatory Visit (INDEPENDENT_AMBULATORY_CARE_PROVIDER_SITE_OTHER): Payer: BC Managed Care – PPO | Admitting: *Deleted

## 2010-07-27 DIAGNOSIS — I82409 Acute embolism and thrombosis of unspecified deep veins of unspecified lower extremity: Secondary | ICD-10-CM

## 2010-07-28 ENCOUNTER — Other Ambulatory Visit: Payer: Self-pay | Admitting: Obstetrics & Gynecology

## 2010-07-28 DIAGNOSIS — Z1231 Encounter for screening mammogram for malignant neoplasm of breast: Secondary | ICD-10-CM

## 2010-08-20 ENCOUNTER — Other Ambulatory Visit: Payer: Self-pay | Admitting: Dermatology

## 2010-08-24 ENCOUNTER — Ambulatory Visit (INDEPENDENT_AMBULATORY_CARE_PROVIDER_SITE_OTHER): Payer: BC Managed Care – PPO | Admitting: *Deleted

## 2010-08-24 DIAGNOSIS — I82409 Acute embolism and thrombosis of unspecified deep veins of unspecified lower extremity: Secondary | ICD-10-CM

## 2010-08-28 ENCOUNTER — Ambulatory Visit
Admission: RE | Admit: 2010-08-28 | Discharge: 2010-08-28 | Disposition: A | Discharge status: Home / Self Care | Payer: BC Managed Care – PPO | Source: Ambulatory Visit | Attending: Obstetrics & Gynecology | Admitting: Obstetrics & Gynecology

## 2010-08-28 DIAGNOSIS — Z1231 Encounter for screening mammogram for malignant neoplasm of breast: Secondary | ICD-10-CM

## 2010-09-21 ENCOUNTER — Ambulatory Visit (INDEPENDENT_AMBULATORY_CARE_PROVIDER_SITE_OTHER): Payer: BC Managed Care – PPO | Admitting: *Deleted

## 2010-09-21 DIAGNOSIS — I82409 Acute embolism and thrombosis of unspecified deep veins of unspecified lower extremity: Secondary | ICD-10-CM

## 2010-09-30 ENCOUNTER — Other Ambulatory Visit: Payer: Self-pay | Admitting: Pharmacist

## 2010-09-30 MED ORDER — WARFARIN SODIUM 5 MG PO TABS: ORAL_TABLET | ORAL | Status: DC

## 2010-10-12 LAB — CA 125: CA 125: 6.4

## 2010-10-19 LAB — COMPREHENSIVE METABOLIC PANEL
ALT: 14
ALT: 15
AST: 17
Albumin: 2.7 — ABNORMAL LOW
Albumin: 3.5
Alkaline Phosphatase: 72
Alkaline Phosphatase: 87
Chloride: 100
Chloride: 103
Creatinine, Ser: 1.37 — ABNORMAL HIGH
GFR calc Af Amer: 48 — ABNORMAL LOW
Glucose, Bld: 112 — ABNORMAL HIGH
Potassium: 3.6
Potassium: 3.7
Sodium: 134 — ABNORMAL LOW
Sodium: 136
Total Bilirubin: 0.8
Total Bilirubin: 1.2
Total Protein: 7.1

## 2010-10-19 LAB — CBC
HCT: 24.9 — ABNORMAL LOW
HCT: 29.4 — ABNORMAL LOW
Hemoglobin: 10.1 — ABNORMAL LOW
MCHC: 34.3
MCHC: 34.9
MCV: 84.6
MCV: 85.3
Platelets: 270
Platelets: 278
Platelets: 329
RBC: 2.85 — ABNORMAL LOW
RBC: 2.98 — ABNORMAL LOW
RDW: 13.9
RDW: 13.9
WBC: 7.8
WBC: 9.8

## 2010-10-19 LAB — I-STAT 8, (EC8 V) (CONVERTED LAB)
Chloride: 103
Glucose, Bld: 112 — ABNORMAL HIGH
Potassium: 3.8
pCO2, Ven: 38.4 — ABNORMAL LOW
pH, Ven: 7.423 — ABNORMAL HIGH

## 2010-10-19 LAB — BASIC METABOLIC PANEL
BUN: 10
BUN: 7
CO2: 23
Chloride: 108
Creatinine, Ser: 0.98
GFR calc non Af Amer: >60
Glucose, Bld: 106 — ABNORMAL HIGH
Glucose, Bld: 136 — ABNORMAL HIGH
Potassium: 3.7

## 2010-10-19 LAB — URINALYSIS, ROUTINE W REFLEX MICROSCOPIC
Nitrite: NEGATIVE
Protein, ur: NEGATIVE
Urobilinogen, UA: 0.2

## 2010-10-19 LAB — VITAMIN B12: Vitamin B-12: 576 (ref 211–911)

## 2010-10-19 LAB — POCT I-STAT CREATININE: Operator id: 198171

## 2010-10-19 LAB — CA 125: CA 125: 6.6

## 2010-10-19 LAB — PROTIME-INR
INR: 1.1
Prothrombin Time: 14.4
Prothrombin Time: 15.7 — ABNORMAL HIGH

## 2010-10-19 LAB — URINE CULTURE

## 2010-10-19 LAB — RETICULOCYTES: RBC.: 3.26 — ABNORMAL LOW

## 2010-10-19 LAB — IRON AND TIBC: Saturation Ratios: 5 — ABNORMAL LOW

## 2010-10-19 LAB — TECHNOLOGIST SMEAR REVIEW: Path Review: ADEQUATE

## 2010-10-19 LAB — OCCULT BLOOD X 1 CARD TO LAB, STOOL: Fecal Occult Bld: NEGATIVE

## 2010-10-19 LAB — CULTURE, BLOOD (ROUTINE X 2)

## 2010-11-02 ENCOUNTER — Ambulatory Visit (INDEPENDENT_AMBULATORY_CARE_PROVIDER_SITE_OTHER): Payer: BC Managed Care – PPO | Admitting: *Deleted

## 2010-11-02 DIAGNOSIS — I82409 Acute embolism and thrombosis of unspecified deep veins of unspecified lower extremity: Secondary | ICD-10-CM

## 2010-11-02 LAB — POCT INR: INR: 3.3

## 2010-11-30 ENCOUNTER — Encounter: Payer: BC Managed Care – PPO | Admitting: *Deleted

## 2010-12-04 ENCOUNTER — Encounter: Payer: Self-pay | Admitting: Cardiovascular Disease

## 2010-12-04 ENCOUNTER — Ambulatory Visit (INDEPENDENT_AMBULATORY_CARE_PROVIDER_SITE_OTHER): Payer: BC Managed Care – PPO | Admitting: *Deleted

## 2010-12-04 ENCOUNTER — Ambulatory Visit (INDEPENDENT_AMBULATORY_CARE_PROVIDER_SITE_OTHER): Payer: BC Managed Care – PPO | Admitting: Cardiovascular Disease

## 2010-12-04 DIAGNOSIS — I82409 Acute embolism and thrombosis of unspecified deep veins of unspecified lower extremity: Secondary | ICD-10-CM

## 2010-12-04 DIAGNOSIS — E669 Obesity, unspecified: Secondary | ICD-10-CM

## 2010-12-04 DIAGNOSIS — I10 Essential (primary) hypertension: Secondary | ICD-10-CM

## 2010-12-04 DIAGNOSIS — G4730 Sleep apnea, unspecified: Secondary | ICD-10-CM

## 2010-12-04 NOTE — Assessment & Plan Note (Signed)
Will make referral to [facility] if patient decides she will do this

## 2010-12-04 NOTE — Patient Instructions (Signed)
Your physician wants you to follow-up in: YEAR WITH DR NISHAN  You will receive a reminder letter in the mail two months in advance. If you don't receive a letter, please call our office to schedule the follow-up appointment.  Your physician recommends that you continue on your current medications as directed. Please refer to the Current Medication list given to you today. 

## 2010-12-04 NOTE — Assessment & Plan Note (Signed)
Well controlled.  Continue current medications and low sodium Dash type diet.   CPAP should help improve control as well

## 2010-12-04 NOTE — Assessment & Plan Note (Signed)
Continue coumadin.  Residual thrombus seen on [place] and still obese

## 2010-12-04 NOTE — Assessment & Plan Note (Signed)
CPAP prescribed will get fitted in two weeks.

## 2010-12-04 NOTE — Progress Notes (Signed)
[name] is seen today for F/U of post-phlebitic syndrome, coumadin Rx and abnormal ECG. She is an ovarian cancer survivor out 5 years now. Her initial extensive DVT was in the setting of ovarian CA. She has had multiple F/U duplex showing persistan clot and recanalizatoin. With her weight we have elected to keep her on coumadin. She has had stable Rx doses and I think it would be ok to let her INR be checked every 6-8 weeks. We discussed Pradaxa but I don't like the idea that it is not reversable. With her meds and compressoin hose her edema is stable. She has had an abnromal ECG in the past with poor R wave progression but normal EF and I suspect it is from lead placement and body habitus No change on ECG today  Long discussion about bariatric surgery.  She has had and open hysterectomy and mesh repair of hernia but would benefit from such surgery.  She has now been Dx with servere sleep apnea and getting CPAP in a couple of weeks.    ROS: Denies fever, malais, weight loss, blurry vision, decreased visual acuity, cough, sputum, SOB, hemoptysis, pleuritic pain, palpitaitons, heartburn, abdominal pain, melena, lower extremity edema, claudication, or rash.  All other systems reviewed and negative  General: Affect appropriate Healthy:  appears stated age HEENT: normal Neck supple with no adenopathy JVP normal no bruits no thyromegaly Lungs clear with no wheezing and good diaphragmatic motion Heart:  S1/S2 no murmur,rub, gallop or click PMI normal Abdomen: benighn, BS positve, no tenderness, no AAA no bruit.  No HSM or HJR Distal pulses intact with no bruits Plus one on right and plus 2  Edema on left with support hose Neuro non-focal Skin warm and dry No muscular weakness   Current Outpatient Prescriptions  Medication Sig Dispense Refill  . acetaminophen (TYLENOL) 500 MG tablet Take 500 mg by mouth every 6 (six) hours as needed.        Marland Kitchen atorvastatin (LIPITOR) 40 MG tablet Take 40 mg by  mouth daily.        . calcium carbonate (OS-CAL) 600 MG TABS Take 600 mg by mouth 2 (two) times daily with a meal.        . cetirizine (ZYRTEC) 10 MG tablet Take 10 mg by mouth daily.        . ferrous sulfate 325 (65 FE) MG tablet Take 325 mg by mouth daily with breakfast.        . fish oil-omega-3 fatty acids 1000 MG capsule Take 2 g by mouth as needed.        Marland Kitchen glucosamine-chondroitin 500-400 MG tablet Take 1 tablet by mouth 2 (two) times daily.        Marland Kitchen lisinopril-hydrochlorothiazide (PRINZIDE,ZESTORETIC) 20-25 MG per tablet Take 1 tablet by mouth daily.        . Multiple Vitamin (MULTIVITAMIN) tablet Take 1 tablet by mouth daily.        . niacin 500 MG tablet Take 500 mg by mouth daily with breakfast.        . vitamin E 400 UNIT capsule Take 400 Units by mouth daily.        Marland Kitchen warfarin (COUMADIN) 5 MG tablet Take as directed by Anticoagulation clinic  180 tablet  0    Allergies  Benoxinate base; Sulfonamide derivatives; and Ivp dye  Electrocardiogram:  NSR 92 low voltage poor R wave progression  Assessment and Plan

## 2010-12-21 ENCOUNTER — Encounter: Payer: BC Managed Care – PPO | Admitting: *Deleted

## 2010-12-23 ENCOUNTER — Ambulatory Visit (INDEPENDENT_AMBULATORY_CARE_PROVIDER_SITE_OTHER): Payer: BC Managed Care – PPO | Admitting: *Deleted

## 2010-12-23 DIAGNOSIS — I82409 Acute embolism and thrombosis of unspecified deep veins of unspecified lower extremity: Secondary | ICD-10-CM

## 2011-01-18 ENCOUNTER — Ambulatory Visit (INDEPENDENT_AMBULATORY_CARE_PROVIDER_SITE_OTHER): Payer: BC Managed Care – PPO | Admitting: *Deleted

## 2011-01-18 DIAGNOSIS — I82409 Acute embolism and thrombosis of unspecified deep veins of unspecified lower extremity: Secondary | ICD-10-CM

## 2011-01-18 NOTE — Patient Instructions (Signed)
More leafy green vegetables, consistently.

## 2011-01-21 ENCOUNTER — Other Ambulatory Visit: Payer: Self-pay

## 2011-01-21 MED ORDER — WARFARIN SODIUM 5 MG PO TABS: ORAL_TABLET | ORAL | Status: DC

## 2011-02-13 ENCOUNTER — Other Ambulatory Visit: Payer: Self-pay | Admitting: Emergency Medicine

## 2011-02-15 ENCOUNTER — Ambulatory Visit (INDEPENDENT_AMBULATORY_CARE_PROVIDER_SITE_OTHER): Payer: BC Managed Care – PPO | Admitting: *Deleted

## 2011-02-15 DIAGNOSIS — I82409 Acute embolism and thrombosis of unspecified deep veins of unspecified lower extremity: Secondary | ICD-10-CM

## 2011-02-15 LAB — POCT INR: INR: 2.9

## 2011-02-17 ENCOUNTER — Telehealth: Payer: Self-pay

## 2011-02-17 NOTE — Telephone Encounter (Signed)
.  [facility]  PT STATES ANOTHER DOCTOR WAS SENDING SOME PAPERWORK TO DR. DAUB AND WOULD LIKE DR. DAUB TO CALL HER TO DISCUSS THE FINDINGS  FAX SHOULD HAVE COME IN ON LAST FRIDAY

## 2011-02-17 NOTE — Telephone Encounter (Signed)
Messages to medical records please call [name] and left her know have not received any records for me to review. I will be happy to do so. Please have her recontact the office who is sending the notes and have them refax  the notes.Marland Kitchen

## 2011-02-27 ENCOUNTER — Telehealth: Payer: Self-pay

## 2011-02-27 NOTE — Telephone Encounter (Signed)
See labs  RD

## 2011-02-27 NOTE — Telephone Encounter (Signed)
PT STATES SHE IS RETURNING OUR CALL FOR LAB RESULTS

## 2011-02-28 NOTE — Telephone Encounter (Signed)
Pt had a progress note from [facility] on her paper chart and Dr. Cleta Alberts had wanted [place] to advice her that she could stop her statin for 4- 6 wks to see if her leg pain improved. Pt notified

## 2011-03-14 ENCOUNTER — Other Ambulatory Visit: Payer: Self-pay | Admitting: Physician Assistant

## 2011-03-15 ENCOUNTER — Ambulatory Visit (INDEPENDENT_AMBULATORY_CARE_PROVIDER_SITE_OTHER): Payer: BC Managed Care – PPO

## 2011-03-15 ENCOUNTER — Telehealth: Payer: Self-pay | Admitting: Family Medicine

## 2011-03-15 DIAGNOSIS — I82409 Acute embolism and thrombosis of unspecified deep veins of unspecified lower extremity: Secondary | ICD-10-CM

## 2011-03-15 LAB — POCT INR: INR: 2.7

## 2011-03-15 NOTE — Telephone Encounter (Signed)
Lis/HCTZ rx printed accidentally... Called into [facility].

## 2011-03-28 ENCOUNTER — Ambulatory Visit (INDEPENDENT_AMBULATORY_CARE_PROVIDER_SITE_OTHER): Payer: BC Managed Care – PPO | Admitting: Family Medicine

## 2011-03-28 VITALS — BP 121/76 | HR 72 | Temp 98.7°F | Resp 16 | Ht 66.25 in | Wt 293.6 lb

## 2011-03-28 DIAGNOSIS — I10 Essential (primary) hypertension: Secondary | ICD-10-CM

## 2011-03-28 DIAGNOSIS — Z7901 Long term (current) use of anticoagulants: Secondary | ICD-10-CM

## 2011-03-28 DIAGNOSIS — Z86718 Personal history of other venous thrombosis and embolism: Secondary | ICD-10-CM

## 2011-03-28 DIAGNOSIS — G4730 Sleep apnea, unspecified: Secondary | ICD-10-CM

## 2011-03-28 DIAGNOSIS — M79606 Pain in leg, unspecified: Secondary | ICD-10-CM

## 2011-03-28 DIAGNOSIS — IMO0001 Reserved for inherently not codable concepts without codable children: Secondary | ICD-10-CM

## 2011-03-28 DIAGNOSIS — D72819 Decreased white blood cell count, unspecified: Secondary | ICD-10-CM

## 2011-03-28 DIAGNOSIS — E785 Hyperlipidemia, unspecified: Secondary | ICD-10-CM

## 2011-03-28 LAB — COMPREHENSIVE METABOLIC PANEL
Alkaline Phosphatase: 77 U/L (ref 39–117)
CO2: 24 mEq/L (ref 19–32)
Creat: 0.75 mg/dL (ref 0.50–1.10)
Glucose, Bld: 93 mg/dL (ref 70–99)
Sodium: 140 mEq/L (ref 135–145)
Total Bilirubin: 0.5 mg/dL (ref 0.3–1.2)
Total Protein: 7 g/dL (ref 6.0–8.3)

## 2011-03-28 LAB — LIPID PANEL
Cholesterol: 338 mg/dL — ABNORMAL HIGH (ref 0–200)
Total CHOL/HDL Ratio: 8.7 Ratio
Triglycerides: 447 mg/dL — ABNORMAL HIGH (ref <150)

## 2011-03-28 LAB — POCT SEDIMENTATION RATE: POCT SED RATE: 30 mm/hr — AB (ref 0–22)

## 2011-03-28 LAB — POCT CBC
Granulocyte percent: 48.3 %G (ref 37–80)
HCT, POC: 41.6 % (ref 37.7–47.9)
Hemoglobin: 13 g/dL (ref 12.2–16.2)
MPV: 9 fL (ref 0–99.8)
POC Granulocyte: 1.3 — AB (ref 2–6.9)
POC MID %: 12.3 %M — AB (ref 0–12)
RBC: 4.69 M/uL (ref 4.04–5.48)

## 2011-03-28 LAB — CK: Total CK: 92 U/L (ref 7–177)

## 2011-03-28 MED ORDER — LISINOPRIL-HYDROCHLOROTHIAZIDE 20-25 MG PO TABS: 1.0000 | ORAL_TABLET | Freq: Every day | ORAL | Status: DC

## 2011-03-28 NOTE — Progress Notes (Signed)
Addended by: Dois Davenport on: 03/28/2011 10:04 AM   Modules accepted: Orders

## 2011-03-28 NOTE — Progress Notes (Signed)
Subjective:    Patient ID: [name], female    DOB: [birth year], 61 y.o.   MRN: [id number]  HPI  Patient presents in routine follow up.  She requests refill of her Lisinopril-HCT and blood draw for cholesterol.  Patient also requests her records updated.  Sleep Apnea- Recent sleep study was positive and patient started on C-PAP.  Initially significant improvement in quality and duration of sleep however the last few days the quality of sleep has deteriorated.  Patient believes there may be an issue with her equipment.  Dyslipidemia- Lipitor stooped secondary to concern that her statin was contributing to leg pain. Patient has been taking co enzyme q 10 and L cartinitine..  Patient states her leg pain did improve transiently however symptoms have returned to initial level over the last few days.  Myalgias- Patient complains of muscle soreness when walking however significant pain with position change ie getting in and out of car.  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  Denies chest pain or SOB more than usual  Review of Systems     Objective:   Physical Exam  Constitutional: She appears well-developed and well-nourished.  HENT:  Right Ear: External ear normal.  Left Ear: External ear normal.  Nose: Nose normal.  Mouth/Throat: Oropharynx is clear and moist.  Neck: Neck supple. No thyromegaly present.  Cardiovascular: Normal rate, regular rhythm and normal heart sounds.   Pulmonary/Chest: Effort normal and breath sounds normal.  Abdominal: Soft. Bowel sounds are normal.  Musculoskeletal: Normal range of motion. She exhibits no tenderness.  Lymphadenopathy:    She has no cervical adenopathy.  Neurological: She is alert. She has normal strength. No sensory deficit.  Reflex Scores:      Patellar reflexes are 1+ on the right side and 1+ on the left side.      Neg SLR (B)  Complains of pain with activation of hip flexors  Skin: Skin is warm.       Results for orders placed in visit on 03/28/11  POCT CBC      Component Value Range   WBC 2.7 (*) 4.6 - 10.2 (K/uL)   Lymph, poc 1.1  0.6 - 3.4    POC LYMPH PERCENT 39.4  10 - 50 (%L)   MID (cbc) 0.3  0 - 0.9    POC MID % 12.3 (*) 0 - 12 (%M)   POC Granulocyte 1.3 (*) 2 - 6.9    Granulocyte percent 48.3  37 - 80 (%G)   RBC 4.69  4.04 - 5.48 (M/uL)   Hemoglobin 13.0  12.2 - 16.2 (g/dL)   HCT, POC 46.9  62.9 - 47.9 (%)   MCV 88.6  80 - 97 (fL)   MCH, POC 27.7  27 - 31.2 (pg)   MCHC 31.3 (*) 31.8 - 35.4 (g/dL)   RDW, POC 52.8     Platelet Count, POC 191  142 - 424 (K/uL)   MPV 9.0  0 - 99.8 (fL)       Assessment & Plan:   1. Myalgia and myositis  Comprehensive metabolic panel, TSH, POCT CBC, POCT SEDIMENTATION RATE, ANA, CK  2. Dyslipidemia  Lipid panel  3. Sleep apnea    4. History of DVT (deep vein thrombosis)    5. HTN (hypertension)  lisinopril-hydrochlorothiazide (PRINZIDE,ZESTORETIC) 20-25 MG per tablet  6. Chronic anticoagulation    7. Leg pain, anterior    8. Leucopenia, mild     Patient to schedule  follow up with Dr. Cleta Alberts regarding myositis.

## 2011-03-29 LAB — ANA: Anti Nuclear Antibody(ANA): NEGATIVE

## 2011-03-31 ENCOUNTER — Ambulatory Visit (INDEPENDENT_AMBULATORY_CARE_PROVIDER_SITE_OTHER): Payer: BC Managed Care – PPO | Admitting: Emergency Medicine

## 2011-03-31 VITALS — BP 105/66 | HR 85 | Temp 98.0°F | Resp 16 | Ht 67.0 in | Wt 294.4 lb

## 2011-03-31 DIAGNOSIS — M79609 Pain in unspecified limb: Secondary | ICD-10-CM

## 2011-03-31 DIAGNOSIS — E785 Hyperlipidemia, unspecified: Secondary | ICD-10-CM

## 2011-03-31 MED ORDER — ATORVASTATIN CALCIUM 40 MG PO TABS: 40.0000 mg | ORAL_TABLET | Freq: Every day | ORAL | Status: DC

## 2011-03-31 NOTE — Patient Instructions (Signed)
Discussed with patient. She is going back to see Dr. Althea Charon and get his opinion regarding her legs. If he feels this is not a mechanical problem we'll make referral to a rheumatologist .

## 2011-03-31 NOTE — Progress Notes (Signed)
  Subjective:    Patient ID: [name], female    DOB: 20-Feb-1950, 61 y.o.   MRN: [id number]  HPI patient enters to discuss her blood work. She recently saw Dr. Hal Hope was found to have a low white count associated with marked elevation in her cholesterol. Her Lipitor was placed on her to see if she had elevated muscle enzymes. Her CPK returned normal. Her sedimentation rate returned slightly elevated at 30. Her cholesterol rose to 330.    Review of Systems noncontributory     Objective:   Physical Exam not repeated. We spent time reviewing her blood work and starting her treatment course.        Assessment & Plan:  Patient being referred back to Dr. Althea Charon for his opinion. We'll see if he feels her leg discomfort is related to her orthotics. If he feels this is not a mechanical problem we will make referral to a rheumatologist.

## 2011-04-02 ENCOUNTER — Encounter: Payer: Self-pay | Admitting: Pharmacist

## 2011-04-02 NOTE — Telephone Encounter (Signed)
This encounter was created in error - please disregard.

## 2011-04-13 ENCOUNTER — Ambulatory Visit: Payer: BC Managed Care – PPO | Admitting: Emergency Medicine

## 2011-04-16 ENCOUNTER — Other Ambulatory Visit: Payer: Self-pay | Admitting: Family Medicine

## 2011-04-16 DIAGNOSIS — R531 Weakness: Secondary | ICD-10-CM

## 2011-04-16 DIAGNOSIS — R52 Pain, unspecified: Secondary | ICD-10-CM

## 2011-04-20 ENCOUNTER — Other Ambulatory Visit: Payer: BC Managed Care – PPO

## 2011-04-21 ENCOUNTER — Ambulatory Visit (INDEPENDENT_AMBULATORY_CARE_PROVIDER_SITE_OTHER): Payer: BC Managed Care – PPO | Admitting: *Deleted

## 2011-04-21 DIAGNOSIS — I82409 Acute embolism and thrombosis of unspecified deep veins of unspecified lower extremity: Secondary | ICD-10-CM

## 2011-04-21 LAB — POCT INR: INR: 4.2

## 2011-04-21 MED ORDER — WARFARIN SODIUM 5 MG PO TABS: ORAL_TABLET | ORAL | Status: DC

## 2011-04-23 ENCOUNTER — Ambulatory Visit
Admission: RE | Admit: 2011-04-23 | Discharge: 2011-04-23 | Disposition: A | Discharge status: Home / Self Care | Payer: BC Managed Care – PPO | Source: Ambulatory Visit | Attending: Family Medicine | Admitting: Family Medicine

## 2011-04-23 DIAGNOSIS — R52 Pain, unspecified: Secondary | ICD-10-CM

## 2011-04-23 DIAGNOSIS — R531 Weakness: Secondary | ICD-10-CM

## 2011-05-12 ENCOUNTER — Ambulatory Visit (INDEPENDENT_AMBULATORY_CARE_PROVIDER_SITE_OTHER): Payer: BC Managed Care – PPO | Admitting: *Deleted

## 2011-05-12 DIAGNOSIS — I82409 Acute embolism and thrombosis of unspecified deep veins of unspecified lower extremity: Secondary | ICD-10-CM

## 2011-05-12 LAB — POCT INR: INR: 4.1

## 2011-06-03 ENCOUNTER — Ambulatory Visit (INDEPENDENT_AMBULATORY_CARE_PROVIDER_SITE_OTHER): Payer: BC Managed Care – PPO | Admitting: Internal Medicine

## 2011-06-03 VITALS — BP 150/74 | HR 89 | Temp 97.4°F | Resp 20 | Ht 67.0 in | Wt 289.0 lb

## 2011-06-03 DIAGNOSIS — G894 Chronic pain syndrome: Secondary | ICD-10-CM

## 2011-06-03 DIAGNOSIS — M79609 Pain in unspecified limb: Secondary | ICD-10-CM

## 2011-06-03 DIAGNOSIS — G8929 Other chronic pain: Secondary | ICD-10-CM

## 2011-06-03 DIAGNOSIS — G629 Polyneuropathy, unspecified: Secondary | ICD-10-CM

## 2011-06-03 DIAGNOSIS — M79606 Pain in leg, unspecified: Secondary | ICD-10-CM

## 2011-06-03 DIAGNOSIS — M545 Low back pain, unspecified: Secondary | ICD-10-CM

## 2011-06-03 DIAGNOSIS — G5790 Unspecified mononeuropathy of unspecified lower limb: Secondary | ICD-10-CM

## 2011-06-03 MED ORDER — LIDOCAINE 5 % EX PTCH: 1.0000 | MEDICATED_PATCH | CUTANEOUS | Status: AC

## 2011-06-03 MED ORDER — HYDROCODONE-ACETAMINOPHEN 5-500 MG PO TABS: 1.0000 | ORAL_TABLET | Freq: Three times a day (TID) | ORAL | Status: AC | PRN

## 2011-06-03 NOTE — Progress Notes (Signed)
  Subjective:    Patient ID: [name], female    DOB: [birth year], 61 y.o.   MRN: [id number]  HPI Chronic LBP and leg pain is getting to her, Drs. Broadus John, and Wang are working withher   Review of Systems unchanged    Objective:   Physical Exam No new findings       Assessment & Plan:  Add Lidoderm patches and vicodin 5/325 F/up Dr. Regino Schultze

## 2011-06-04 ENCOUNTER — Ambulatory Visit (INDEPENDENT_AMBULATORY_CARE_PROVIDER_SITE_OTHER): Payer: BC Managed Care – PPO | Admitting: *Deleted

## 2011-06-04 ENCOUNTER — Telehealth: Payer: Self-pay

## 2011-06-04 DIAGNOSIS — I82409 Acute embolism and thrombosis of unspecified deep veins of unspecified lower extremity: Secondary | ICD-10-CM

## 2011-06-04 LAB — POCT INR: INR: 3.1

## 2011-06-04 NOTE — Telephone Encounter (Signed)
Pt calling to see if it would be possible for [place] to increase her pain medication to half a pill 4 times a day instead of 3 times a day

## 2011-06-04 NOTE — Telephone Encounter (Signed)
Dr. Perrin Maltese do you want to increase?

## 2011-06-23 ENCOUNTER — Ambulatory Visit (INDEPENDENT_AMBULATORY_CARE_PROVIDER_SITE_OTHER): Payer: BC Managed Care – PPO | Admitting: *Deleted

## 2011-06-23 DIAGNOSIS — I82409 Acute embolism and thrombosis of unspecified deep veins of unspecified lower extremity: Secondary | ICD-10-CM

## 2011-06-25 ENCOUNTER — Other Ambulatory Visit: Payer: Self-pay | Admitting: Family Medicine

## 2011-06-28 ENCOUNTER — Telehealth: Payer: Self-pay

## 2011-06-28 DIAGNOSIS — E785 Hyperlipidemia, unspecified: Secondary | ICD-10-CM

## 2011-06-28 NOTE — Telephone Encounter (Signed)
[facility] to [facility] on [facility]. States pts ins requires 3 month supply. [facility] states [facility].

## 2011-06-29 MED ORDER — ATORVASTATIN CALCIUM 40 MG PO TABS: 40.0000 mg | ORAL_TABLET | Freq: Every day | ORAL | Status: DC

## 2011-06-29 NOTE — Telephone Encounter (Signed)
RX sent

## 2011-06-30 ENCOUNTER — Other Ambulatory Visit: Payer: Self-pay | Admitting: *Deleted

## 2011-06-30 DIAGNOSIS — E785 Hyperlipidemia, unspecified: Secondary | ICD-10-CM

## 2011-06-30 MED ORDER — ATORVASTATIN CALCIUM 40 MG PO TABS: 40.0000 mg | ORAL_TABLET | Freq: Every day | ORAL | Status: DC

## 2011-07-14 ENCOUNTER — Ambulatory Visit (INDEPENDENT_AMBULATORY_CARE_PROVIDER_SITE_OTHER): Payer: BC Managed Care – PPO | Admitting: *Deleted

## 2011-07-14 DIAGNOSIS — I82409 Acute embolism and thrombosis of unspecified deep veins of unspecified lower extremity: Secondary | ICD-10-CM

## 2011-07-26 ENCOUNTER — Other Ambulatory Visit: Payer: Self-pay | Admitting: Obstetrics & Gynecology

## 2011-07-26 DIAGNOSIS — Z1231 Encounter for screening mammogram for malignant neoplasm of breast: Secondary | ICD-10-CM

## 2011-08-11 ENCOUNTER — Ambulatory Visit (INDEPENDENT_AMBULATORY_CARE_PROVIDER_SITE_OTHER): Payer: BC Managed Care – PPO | Admitting: *Deleted

## 2011-08-11 DIAGNOSIS — I82409 Acute embolism and thrombosis of unspecified deep veins of unspecified lower extremity: Secondary | ICD-10-CM

## 2011-08-13 ENCOUNTER — Encounter: Payer: Self-pay | Admitting: Emergency Medicine

## 2011-08-31 ENCOUNTER — Ambulatory Visit
Admission: RE | Admit: 2011-08-31 | Discharge: 2011-08-31 | Disposition: A | Discharge status: Home / Self Care | Payer: BC Managed Care – PPO | Source: Ambulatory Visit | Attending: Obstetrics & Gynecology | Admitting: Obstetrics & Gynecology

## 2011-08-31 DIAGNOSIS — Z1231 Encounter for screening mammogram for malignant neoplasm of breast: Secondary | ICD-10-CM

## 2011-08-31 IMAGING — MG MM DIGITAL SCREENING BILAT
7 series · 7 of 7 positions shown · non-contrast
Comparison: Previous exams.

CLINICAL DATA: Screening.

DIGITAL BILATERAL SCREENING MAMMOGRAM WITH CAD

[R CC]
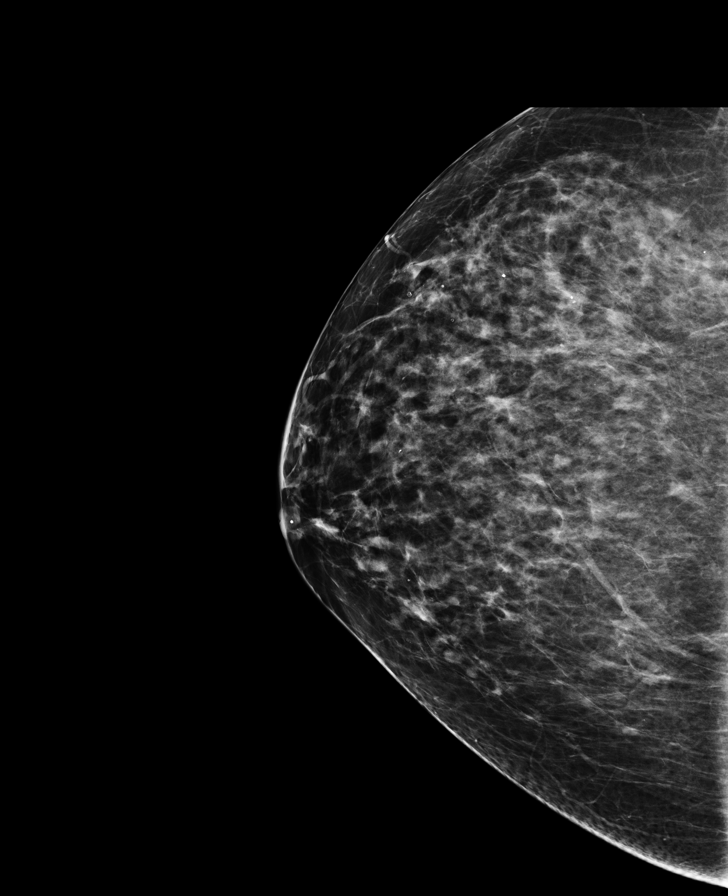

[R MLO (1 of 2)]
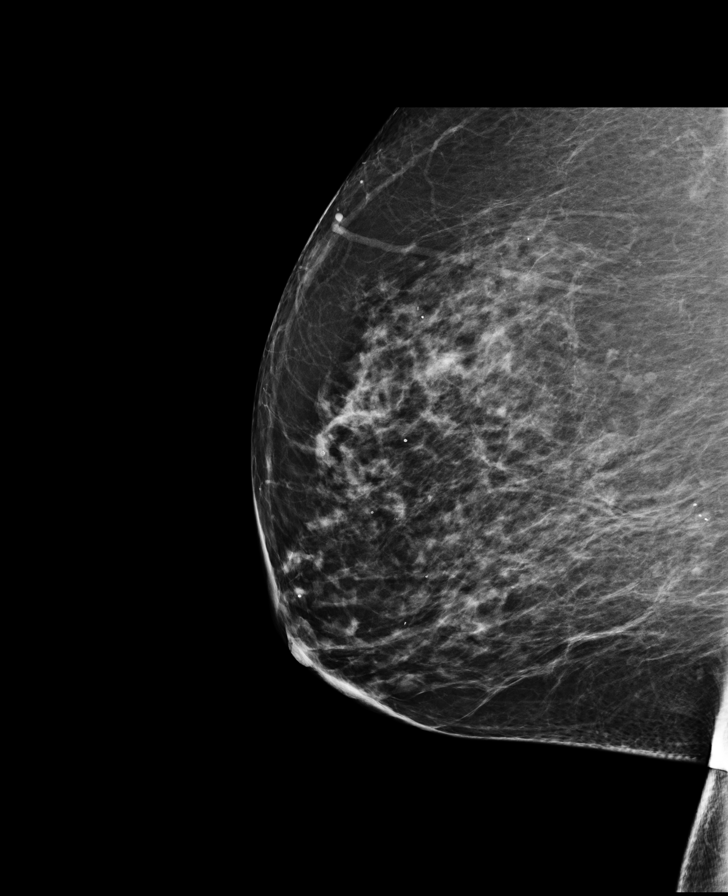

[L MLO (1 of 2)]
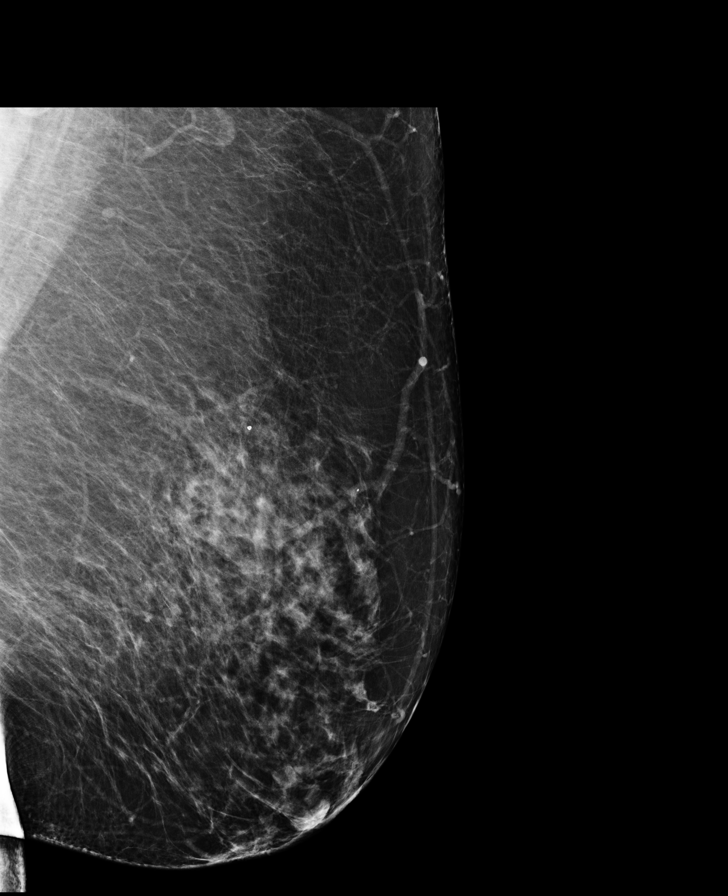

[L MLO (2 of 2)]
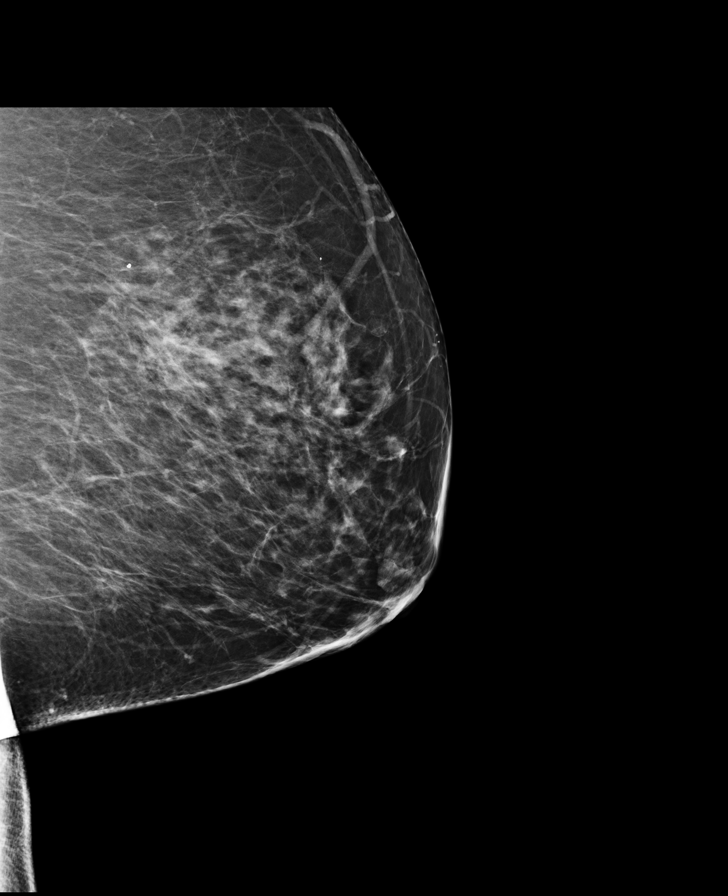

[R MLO (2 of 2)]
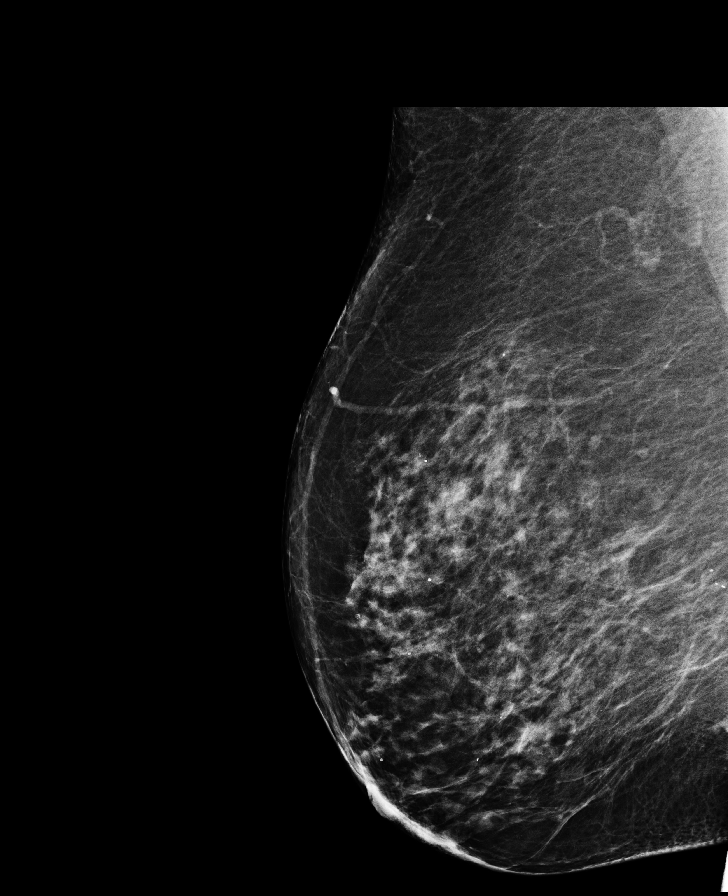

[L CC (1 of 2)]
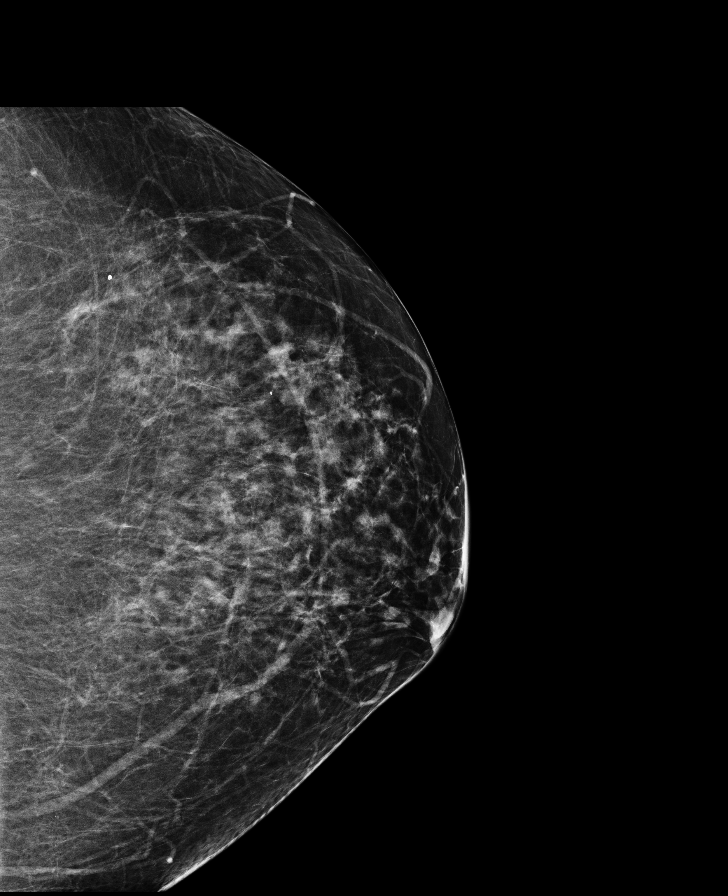

[L CC (2 of 2)]
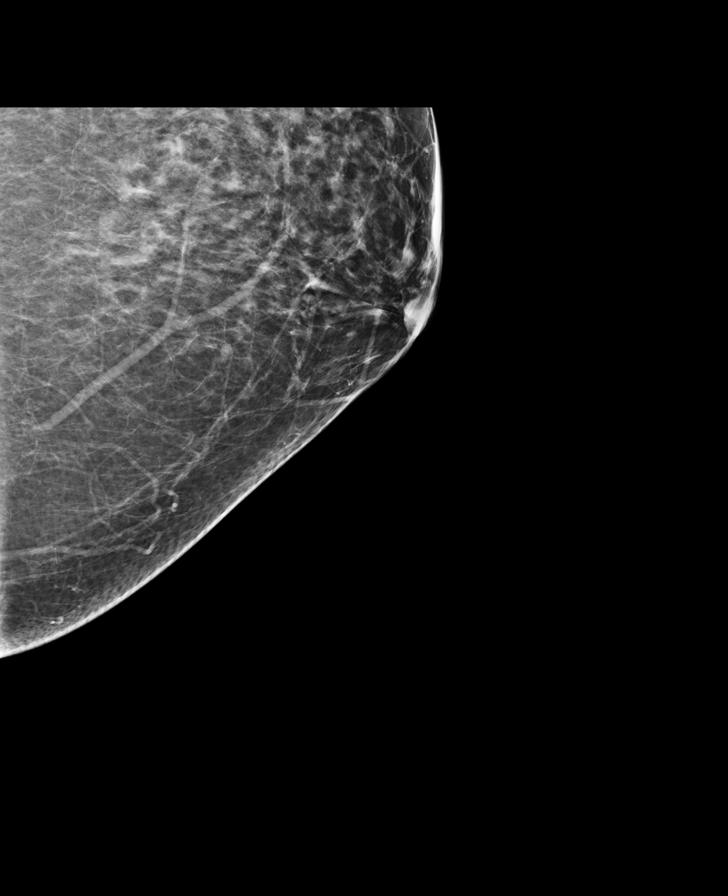

[7 of 7 positions shown; findings below may reference images not displayed]

FINDINGS: The breast tissue is heterogeneously dense. No
suspicious masses, architectural distortion, or calcifications are
present.

Images were processed with CAD.
IMPRESSION: No mammographic evidence of malignancy.

A result letter of this screening mammogram will be mailed directly
to the patient.

RECOMMENDATION:
Screening mammogram in one year. (Code:[F9])

BI-RADS CATEGORY 2:  Benign finding(s).

## 2011-09-08 ENCOUNTER — Ambulatory Visit (INDEPENDENT_AMBULATORY_CARE_PROVIDER_SITE_OTHER): Payer: BC Managed Care – PPO | Admitting: *Deleted

## 2011-09-08 DIAGNOSIS — I82409 Acute embolism and thrombosis of unspecified deep veins of unspecified lower extremity: Secondary | ICD-10-CM

## 2011-09-15 ENCOUNTER — Ambulatory Visit (INDEPENDENT_AMBULATORY_CARE_PROVIDER_SITE_OTHER): Payer: BC Managed Care – PPO | Admitting: Emergency Medicine

## 2011-09-15 ENCOUNTER — Ambulatory Visit: Payer: BC Managed Care – PPO

## 2011-09-15 ENCOUNTER — Other Ambulatory Visit: Payer: Self-pay | Admitting: Emergency Medicine

## 2011-09-15 VITALS — BP 120/70 | HR 86 | Temp 98.6°F | Resp 14 | Ht 67.0 in

## 2011-09-15 DIAGNOSIS — R209 Unspecified disturbances of skin sensation: Secondary | ICD-10-CM

## 2011-09-15 DIAGNOSIS — M549 Dorsalgia, unspecified: Secondary | ICD-10-CM

## 2011-09-15 DIAGNOSIS — E785 Hyperlipidemia, unspecified: Secondary | ICD-10-CM

## 2011-09-15 DIAGNOSIS — D763 Other histiocytosis syndromes: Secondary | ICD-10-CM

## 2011-09-15 DIAGNOSIS — M542 Cervicalgia: Secondary | ICD-10-CM

## 2011-09-15 DIAGNOSIS — R200 Anesthesia of skin: Secondary | ICD-10-CM

## 2011-09-15 DIAGNOSIS — B029 Zoster without complications: Secondary | ICD-10-CM

## 2011-09-15 DIAGNOSIS — M791 Myalgia, unspecified site: Secondary | ICD-10-CM

## 2011-09-15 DIAGNOSIS — IMO0001 Reserved for inherently not codable concepts without codable children: Secondary | ICD-10-CM

## 2011-09-15 DIAGNOSIS — D72819 Decreased white blood cell count, unspecified: Secondary | ICD-10-CM

## 2011-09-15 LAB — POCT CBC
Granulocyte percent: 60.5 %G (ref 37–80)
HCT, POC: 44.8 % (ref 37.7–47.9)
MPV: 8.6 fL (ref 0–99.8)
POC Granulocyte: 2.5 (ref 2–6.9)
POC LYMPH PERCENT: 30.6 %L (ref 10–50)
POC MID %: 8.9 %M (ref 0–12)
Platelet Count, POC: 222 10*3/uL (ref 142–424)
RDW, POC: 15.7 %

## 2011-09-15 LAB — POCT SEDIMENTATION RATE: POCT SED RATE: 17 mm/hr (ref 0–22)

## 2011-09-15 MED ORDER — VALACYCLOVIR HCL 1 G PO TABS: 1000.0000 mg | ORAL_TABLET | Freq: Three times a day (TID) | ORAL | Status: DC

## 2011-09-15 NOTE — Patient Instructions (Signed)

## 2011-09-15 NOTE — Progress Notes (Signed)
  Subjective:    Patient ID: [name], female    DOB: [birth year], 61 y.o.   MRN: [id number]  HPI patient enters with multiple complaints the she continues to have low back pain which has been associated with weakness of the proximal muscles of both legs. She has been to see Dr. Modesto Charon. She has also seen Dr. Althea Charon . She has received acupuncture treatments. She would also like her cholesterol checked. She also has a two-day history of a rash involving the right side of her face primarily in the right supraorbital area and adjacent to the right    Review of Systems     Objective:   Physical Exam there is a reduction on the right side of her face with some blister formation that involves the supraorbital right temple and around the right ear. The nose is spared. The eye itself is completely normal pupils equal and reactive to light the neck is supple deep tendon reflexes upper extremities are 2+. There is no tenderness over the lower lumbar spine she complains of weakness involving the proximal right thigh muscles and the note from physical therapy he describes weakness in the small cell groups. She is wearing a stocking on her left leg does not have significant calf tenderness or swelling .    Results for orders placed in visit on 09/08/11  POCT INR      Component Value Range   INR 2.5     Results for orders placed in visit on 09/15/11  POCT CBC      Component Value Range   WBC 4.1 (*) 4.6 - 10.2 K/uL   Lymph, poc 1.3  0.6 - 3.4   POC LYMPH PERCENT 30.6  10 - 50 %L   MID (cbc) 0.4  0 - 0.9   POC MID % 8.9  0 - 12 %M   POC Granulocyte 2.5  2 - 6.9   Granulocyte percent 60.5  37 - 80 %G   RBC 4.91  4.04 - 5.48 M/uL   Hemoglobin 13.9  12.2 - 16.2 g/dL   HCT, POC 46.9  62.9 - 47.9 %   MCV 91.3  80 - 97 fL   MCH, POC 28.3  27 - 31.2 pg   MCHC 31.0 (*) 31.8 - 35.4 g/dL   RDW, POC 52.8     Platelet Count, POC 222  142 - 424 K/uL   MPV 8.6  0 - 99.8 fL     [facility] reading (PRIMARY)  by  Dr.Janthony Holleman  Cervical disc disease with arthritis   Assessment & Plan:   The patient has severe cervical disc disease with symptoms in her hands and legs. We'll check an MRI of the C-spine. She will make an appointment to see Dr. Regino Schultze. We will make an appointment for her to have an MRI the C-spine as well as an appointment with Dr.Deveshwar.

## 2011-09-16 ENCOUNTER — Telehealth: Payer: Self-pay

## 2011-09-16 LAB — CK: Total CK: 79 U/L (ref 7–177)

## 2011-09-16 LAB — LIPID PANEL
HDL: 47 mg/dL (ref >39)
LDL Cholesterol: 101 mg/dL — ABNORMAL HIGH (ref 0–99)
Total CHOL/HDL Ratio: 4.4 Ratio
VLDL: 59 mg/dL — ABNORMAL HIGH (ref 0–40)

## 2011-09-16 LAB — RHEUMATOID FACTOR: Rhuematoid fact SerPl-aCnc: <10 IU/mL (ref <=14)

## 2011-09-16 NOTE — Telephone Encounter (Signed)
Pt wanted me to let you know that her physical therapist said that she would probably need surgery and wanted me to ask you if you thought that was likely.

## 2011-09-17 LAB — SPEP & IFE WITH QIG
Alpha-1-Globulin: 3.6 % (ref 2.9–4.9)
Alpha-2-Globulin: 10.6 % (ref 7.1–11.8)
Beta 2: 6.5 % (ref 3.2–6.5)
Beta Globulin: 6.5 % (ref 4.7–7.2)
Gamma Globulin: 11.9 % (ref 11.1–18.8)
IgG (Immunoglobin G), Serum: 1050 mg/dL (ref 690–1700)

## 2011-09-17 NOTE — Telephone Encounter (Signed)
Please let patient know we will need to see what her MRI shows first. Then we can discuss treatment options.Marland Kitchen

## 2011-09-17 NOTE — Telephone Encounter (Signed)
Patient notified

## 2011-09-20 ENCOUNTER — Telehealth: Payer: Self-pay

## 2011-09-20 DIAGNOSIS — B0230 Zoster ocular disease, unspecified: Secondary | ICD-10-CM

## 2011-09-20 NOTE — Telephone Encounter (Signed)
Called patient about labs.  States her shingles are now on her face and eye.  Her face is swollen but the spot around her eye has pus and swollen.  Please advised.

## 2011-09-20 NOTE — Telephone Encounter (Signed)
Patient wants MRI at [facility] imaging.

## 2011-09-20 NOTE — Telephone Encounter (Signed)
Patient states her eye is worse, she has shingles, do you want to recheck this or refer to opthamology?

## 2011-09-20 NOTE — Telephone Encounter (Signed)
The patient called back regarding her MRI that is being scheduled or has been scheduled- She wants to make sure that the MRI is done at [facility] at [address]. Please call the patient at [phone].

## 2011-09-20 NOTE — Telephone Encounter (Signed)
Please talk to referrals in nature the MRIs done at [facility] imaging because this is the place the patient prefers.

## 2011-09-21 ENCOUNTER — Telehealth: Payer: Self-pay

## 2011-09-21 ENCOUNTER — Telehealth: Payer: Self-pay | Admitting: Radiology

## 2011-09-21 NOTE — Telephone Encounter (Signed)
Order put in for urgent referral. Will you help me with this??

## 2011-09-21 NOTE — Telephone Encounter (Signed)
What did the eye MD say? Patient should take what the eye MD advised. Did Dr. Nile Riggs Rx anything? Any follow up with their office?

## 2011-09-21 NOTE — Telephone Encounter (Signed)
DAUB - SAW EYE DR. TODAY.  NO INFECTION, NO TEAR.  EVIDENCE OF BLISTERS DRYING AND THINKS IT MAY BE REACTION TO MEDICATION.  WANTS TO TRY VALTREX.  SHE HAS ONLY TWO PILLS LEFT, ONE FOR TONIGHT AND ONE FOR IN THE MORNING.  SHOULD SHE CONTINUE THIS?  OTHER POSSIBLE DRUGS IS GLUCOSOMENE CHONDRITIN.  [phone]

## 2011-09-21 NOTE — Telephone Encounter (Signed)
Pt did see Dr Nile Riggs today, he thought the swelling of her eye may be from a drug reaction in addition to the occular shingles outbreak. ( the swelling is everywhere not just her eye )Valtrex is only new medication she is taking, please advise if she needs to d/c this medication. She has been advised to stop the Glucosamine now.

## 2011-09-21 NOTE — Telephone Encounter (Signed)
I have spoken to [name] at [facility] and MRI C spine has been denied. Have called for an appeal on this and am expecting a call back, will let you now when I hear back from this.

## 2011-09-21 NOTE — Telephone Encounter (Signed)
Please call Dr. Lucious Groves office and get her an appointment to see him ASAP.

## 2011-09-22 NOTE — Telephone Encounter (Signed)
Asked pt what Dr Nile Riggs advised about Valtrex and she stated that she has already finished the Valtrex, but she was also using a lotion and has DCd it also in case it was causing an allergic Rxn. Advised pt that she can try taking Zyrtec and Zantac for antihistamine effect and checked w/Dr Cleta Alberts to check for safety to take w/her Coumadin. Advised pt Dr Kennon Portela use of these two meds but stated she needs to call Coumadin clinic to report she is taking them in case they need to adjust her medication. Pt agreed.

## 2011-09-28 NOTE — Telephone Encounter (Signed)
This has been taken care of, patient has seen Dr Dione Booze already

## 2011-09-30 ENCOUNTER — Telehealth: Payer: Self-pay

## 2011-09-30 ENCOUNTER — Other Ambulatory Visit: Payer: Self-pay | Admitting: Physician Assistant

## 2011-09-30 NOTE — Telephone Encounter (Signed)
LMOM to advise [name] that Dr Cleta Alberts does want her to see Dr Regino Schultze and we have made a copy of her xrays to take w/her - she may come by to p/up at 102. Advised [name] to call if she has any trouble getting appt w/Dr Regino Schultze or has any ?s/concerns.

## 2011-09-30 NOTE — Telephone Encounter (Signed)
PS please call the patient and let her know I want her to see Dr. Regino Schultze. Tell her to call our front desk and have them make a copy of the x-rays that were done here so she can carry them with her. If she has any difficulty getting in to see Dr. Regino Schultze let me know

## 2011-09-30 NOTE — Telephone Encounter (Signed)
DAUB - HER MRI WAS DECLINED.  SHOULD SHE STILL SET UP APPT WITH DR Regino Schultze WITHOUT THE MRI?  [phone]

## 2011-10-01 ENCOUNTER — Telehealth: Payer: Self-pay | Admitting: Cardiovascular Disease

## 2011-10-01 NOTE — Telephone Encounter (Signed)
New problem:  C/O very concern that she may have a  clot in left leg.

## 2011-10-01 NOTE — Telephone Encounter (Signed)
Per pt - reports she had a blood clot in her left leg in 2008 - she is now having pain, soreness, tenderness and heaviness to touch and movement.  Massage helped last night.  This occurs from her shin to her knee and into her upper thigh.  She was doing physical therapy but that was stopped due to a neck issue.  She thought the soreness was coming from that however it has not improved since.  She is on coumadin and has not missed any doses.  Aware I will review with Dr Eden Emms for orders.

## 2011-10-03 NOTE — Telephone Encounter (Signed)
Can order a lower extremity duplex to R/O DVT on the leg that hurts

## 2011-10-04 NOTE — Telephone Encounter (Signed)
Patient called states her left leg is better no pain.States she did not know why she had the pain this past Friday 9/20 .States she will call back if has pain again.

## 2011-10-05 ENCOUNTER — Other Ambulatory Visit: Payer: Self-pay | Admitting: Dermatology

## 2011-10-15 ENCOUNTER — Telehealth: Payer: Self-pay

## 2011-10-15 NOTE — Telephone Encounter (Signed)
[facility] needs recent labs for patient   Fax Number [phone] CBN : [phone] Contact Name:  Misty Stanley

## 2011-10-15 NOTE — Telephone Encounter (Signed)
We referred there, okay to send, so I have sent all labs from recent office visit.

## 2011-10-20 ENCOUNTER — Ambulatory Visit (INDEPENDENT_AMBULATORY_CARE_PROVIDER_SITE_OTHER): Payer: BC Managed Care – PPO | Admitting: Pharmacist

## 2011-10-20 DIAGNOSIS — I82409 Acute embolism and thrombosis of unspecified deep veins of unspecified lower extremity: Secondary | ICD-10-CM

## 2011-10-29 ENCOUNTER — Other Ambulatory Visit: Payer: Self-pay | Admitting: Pharmacist

## 2011-10-29 MED ORDER — WARFARIN SODIUM 5 MG PO TABS: ORAL_TABLET | ORAL | Status: DC

## 2011-11-03 ENCOUNTER — Telehealth: Payer: Self-pay

## 2011-11-03 NOTE — Telephone Encounter (Signed)
Mailed called patient to advise.

## 2011-11-03 NOTE — Telephone Encounter (Signed)
PT WOULD LIKE TO HAVE HER LABS MAILED TO HER HOME. PLEASE CALL [phone] WHEN DONE AND HER ADDRESS IS CORRECT IN SYSTEM

## 2011-11-09 ENCOUNTER — Other Ambulatory Visit (HOSPITAL_BASED_OUTPATIENT_CLINIC_OR_DEPARTMENT_OTHER): Payer: Self-pay | Admitting: Rheumatology

## 2011-11-09 DIAGNOSIS — R52 Pain, unspecified: Secondary | ICD-10-CM

## 2011-11-09 DIAGNOSIS — M5410 Radiculopathy, site unspecified: Secondary | ICD-10-CM

## 2011-11-11 ENCOUNTER — Telehealth: Payer: Self-pay | Admitting: Radiology

## 2011-11-11 NOTE — Telephone Encounter (Signed)
I have called patient, she has brought in forms for cancer policy. She had skin cancer, she will have Dr Danella Deis send information to [place] on this. Also she had ovarian cancer, her OB/ Gyn was Dr Carey Bullocks. Patient is asking who took over Dr Carey Bullocks practice and if records would be available. I told her I will try to find out.

## 2011-11-16 ENCOUNTER — Ambulatory Visit (HOSPITAL_BASED_OUTPATIENT_CLINIC_OR_DEPARTMENT_OTHER)
Admission: RE | Admit: 2011-11-16 | Discharge: 2011-11-16 | Disposition: A | Discharge status: Home / Self Care | Payer: BC Managed Care – PPO | Source: Ambulatory Visit | Attending: Rheumatology | Admitting: Rheumatology

## 2011-11-16 DIAGNOSIS — R52 Pain, unspecified: Secondary | ICD-10-CM

## 2011-11-16 DIAGNOSIS — M5030 Other cervical disc degeneration, unspecified cervical region: Secondary | ICD-10-CM | POA: Insufficient documentation

## 2011-11-16 DIAGNOSIS — M47812 Spondylosis without myelopathy or radiculopathy, cervical region: Secondary | ICD-10-CM | POA: Insufficient documentation

## 2011-11-16 DIAGNOSIS — M5410 Radiculopathy, site unspecified: Secondary | ICD-10-CM

## 2011-11-24 ENCOUNTER — Ambulatory Visit (INDEPENDENT_AMBULATORY_CARE_PROVIDER_SITE_OTHER): Payer: BC Managed Care – PPO | Admitting: *Deleted

## 2011-11-24 ENCOUNTER — Ambulatory Visit (INDEPENDENT_AMBULATORY_CARE_PROVIDER_SITE_OTHER): Payer: BC Managed Care – PPO | Admitting: Cardiovascular Disease

## 2011-11-24 ENCOUNTER — Encounter: Payer: Self-pay | Admitting: Cardiovascular Disease

## 2011-11-24 VITALS — BP 138/73 | HR 77 | Resp 18 | Ht 67.0 in | Wt 307.0 lb

## 2011-11-24 DIAGNOSIS — E669 Obesity, unspecified: Secondary | ICD-10-CM

## 2011-11-24 DIAGNOSIS — I10 Essential (primary) hypertension: Secondary | ICD-10-CM

## 2011-11-24 DIAGNOSIS — I82409 Acute embolism and thrombosis of unspecified deep veins of unspecified lower extremity: Secondary | ICD-10-CM

## 2011-11-24 DIAGNOSIS — M1710 Unilateral primary osteoarthritis, unspecified knee: Secondary | ICD-10-CM | POA: Insufficient documentation

## 2011-11-24 DIAGNOSIS — G4730 Sleep apnea, unspecified: Secondary | ICD-10-CM

## 2011-11-24 DIAGNOSIS — R9431 Abnormal electrocardiogram [ECG] [EKG]: Secondary | ICD-10-CM

## 2011-11-24 NOTE — Assessment & Plan Note (Signed)
Well controlled.  Continue current medications and low sodium Dash type diet.    

## 2011-11-24 NOTE — Patient Instructions (Signed)
Your physician wants you to follow-up in:  6 MONTHS WITH DR NISHAN  You will receive a reminder letter in the mail two months in advance. If you don't receive a letter, please call our office to schedule the follow-up appointment. Your physician recommends that you continue on your current medications as directed. Please refer to the Current Medication list given to you today. 

## 2011-11-24 NOTE — Assessment & Plan Note (Signed)
No change on ECG today nonspecific no cardiac symptoms

## 2011-11-24 NOTE — Assessment & Plan Note (Signed)
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  On coumadin

## 2011-11-24 NOTE — Progress Notes (Signed)
Patient ID: [name], female   DOB: 06-18-1950, 61 y.o.   MRN: [id number] Loralyn is seen today for F/U of post-phlebitic syndrome, coumadin Rx and abnormal ECG. She is an ovarian cancer survivor out 5 years now. Her initial extensive DVT was in the setting of ovarian CA. She has had multiple F/U duplex showing persistan clot and recanalizatoin. With her weight we have elected to keep her on coumadin. She has had stable Rx doses and I think it would be ok to let her INR be checked every 6-8 weeks. We discussed Pradaxa but I don't like the idea that it is not reversable. With her meds and compressoin hose her edema is stable. She has had an abnromal ECG in the past with poor R wave progression but normal EF and I suspect it is from lead placement and body habitus No change on ECG today  Long discussion about bariatric surgery. She has had and open hysterectomy and mesh repair of hernia but would benefit from such surgery. She has now been Dx with servere sleep apnea and on CPAP Not interested in surgery  Had injection of right knee with Deveshwar and improved but will eventually need bilateral knee replacements  ROS: Denies fever, malais, weight loss, blurry vision, decreased visual acuity, cough, sputum, SOB, hemoptysis, pleuritic pain, palpitaitons, heartburn, abdominal pain, melena, lower extremity edema, claudication, or rash.  All other systems reviewed and negative  General: Affect appropriate Obese female HEENT: normal Neck supple with no adenopathy JVP normal no bruits no thyromegaly Lungs clear with no wheezing and good diaphragmatic motion Heart:  S1/S2 no murmur, no rub, gallop or click PMI normal Abdomen: benighn, BS positve, no tenderness, no AAA no bruit.  No HSM or HJR Distal pulses intact with no bruits Plus two LLE  edema Neuro non-focal Skin warm and dry No muscular weakness   Current Outpatient Prescriptions  Medication Sig Dispense Refill  . atorvastatin (LIPITOR)  40 MG tablet Take 1 tablet (40 mg total) by mouth daily.  90 tablet  0  . calcium carbonate (OS-CAL) 600 MG TABS Take 600 mg by mouth 2 (two) times daily with a meal.        . cetirizine (ZYRTEC) 10 MG tablet Take 10 mg by mouth daily.        Marland Kitchen co-enzyme Q-10 30 MG capsule Take 400 mg by mouth daily.      . ferrous sulfate 325 (65 FE) MG tablet Take 325 mg by mouth daily with breakfast.        . fish oil-omega-3 fatty acids 1000 MG capsule Take 2 g by mouth as needed.        Marland Kitchen glucosamine-chondroitin 500-400 MG tablet Take 1 tablet by mouth 3 (three) times daily.      Marland Kitchen HYDROcodone-acetaminophen (VICODIN) 5-500 MG per tablet Take 1 tablet by mouth every 6 (six) hours as needed. Take 1/2 tab QID      . levocarnitine (CARNITOR) 250 MG capsule Take 250 mg by mouth daily.      Marland Kitchen lisinopril-hydrochlorothiazide (PRINZIDE,ZESTORETIC) 20-25 MG per tablet TAKE 1 TABLET BY MOUTH EVERY DAY  90 tablet  0  . Multiple Vitamin (MULTIVITAMIN) tablet Take 1 tablet by mouth daily.        . niacin 500 MG tablet Take 500 mg by mouth daily with breakfast.        . valACYclovir (VALTREX) 1000 MG tablet Take 1 tablet (1,000 mg total) by mouth 3 (three) times daily.  21  tablet  0  . vitamin E 400 UNIT capsule Take 400 Units by mouth daily.        Marland Kitchen warfarin (COUMADIN) 5 MG tablet Take as directed by Anticoagulation clinic  150 tablet  1    Allergies  Valtrex; Benoxinate base; Sulfonamide derivatives; and Ivp dye  Electrocardiogram:  NSR rate 77 low voltage nonspecific ST/T wave changes   Assessment and Plan

## 2011-11-24 NOTE — Assessment & Plan Note (Signed)
Continue CPAP related to obesity

## 2011-11-24 NOTE — Assessment & Plan Note (Signed)
Encouraged her to have bariatric surgery Not interested Hopefully if her knee pain improves she can be more active

## 2011-12-15 ENCOUNTER — Ambulatory Visit (INDEPENDENT_AMBULATORY_CARE_PROVIDER_SITE_OTHER): Payer: BC Managed Care – PPO | Admitting: *Deleted

## 2011-12-15 DIAGNOSIS — I82409 Acute embolism and thrombosis of unspecified deep veins of unspecified lower extremity: Secondary | ICD-10-CM

## 2011-12-19 ENCOUNTER — Other Ambulatory Visit: Payer: Self-pay | Admitting: Physician Assistant

## 2011-12-25 ENCOUNTER — Emergency Department (HOSPITAL_COMMUNITY)
Admission: EM | Admit: 2011-12-25 | Discharge: 2011-12-25 | Disposition: A | Discharge status: Home / Self Care | Payer: BC Managed Care – PPO | Attending: Emergency Medicine | Admitting: Emergency Medicine

## 2011-12-25 ENCOUNTER — Emergency Department (HOSPITAL_COMMUNITY): Payer: BC Managed Care – PPO

## 2011-12-25 ENCOUNTER — Ambulatory Visit: Payer: BC Managed Care – PPO

## 2011-12-25 ENCOUNTER — Ambulatory Visit (INDEPENDENT_AMBULATORY_CARE_PROVIDER_SITE_OTHER): Payer: BC Managed Care – PPO | Admitting: Emergency Medicine

## 2011-12-25 ENCOUNTER — Encounter (HOSPITAL_COMMUNITY): Payer: Self-pay | Admitting: Emergency Medicine

## 2011-12-25 VITALS — BP 160/90 | HR 88 | Temp 98.1°F | Resp 18 | Wt 309.0 lb

## 2011-12-25 DIAGNOSIS — Z8543 Personal history of malignant neoplasm of ovary: Secondary | ICD-10-CM | POA: Insufficient documentation

## 2011-12-25 DIAGNOSIS — M545 Low back pain, unspecified: Secondary | ICD-10-CM

## 2011-12-25 DIAGNOSIS — I10 Essential (primary) hypertension: Secondary | ICD-10-CM | POA: Insufficient documentation

## 2011-12-25 DIAGNOSIS — Z79899 Other long term (current) drug therapy: Secondary | ICD-10-CM | POA: Insufficient documentation

## 2011-12-25 DIAGNOSIS — M549 Dorsalgia, unspecified: Secondary | ICD-10-CM

## 2011-12-25 DIAGNOSIS — R109 Unspecified abdominal pain: Secondary | ICD-10-CM

## 2011-12-25 DIAGNOSIS — Z87442 Personal history of urinary calculi: Secondary | ICD-10-CM | POA: Insufficient documentation

## 2011-12-25 DIAGNOSIS — R52 Pain, unspecified: Secondary | ICD-10-CM

## 2011-12-25 DIAGNOSIS — Z7901 Long term (current) use of anticoagulants: Secondary | ICD-10-CM | POA: Insufficient documentation

## 2011-12-25 DIAGNOSIS — R110 Nausea: Secondary | ICD-10-CM

## 2011-12-25 DIAGNOSIS — E785 Hyperlipidemia, unspecified: Secondary | ICD-10-CM | POA: Insufficient documentation

## 2011-12-25 DIAGNOSIS — D649 Anemia, unspecified: Secondary | ICD-10-CM | POA: Insufficient documentation

## 2011-12-25 DIAGNOSIS — Z90710 Acquired absence of both cervix and uterus: Secondary | ICD-10-CM | POA: Insufficient documentation

## 2011-12-25 DIAGNOSIS — Z86718 Personal history of other venous thrombosis and embolism: Secondary | ICD-10-CM | POA: Insufficient documentation

## 2011-12-25 DIAGNOSIS — Z8522 Personal history of malignant neoplasm of nasal cavities, middle ear, and accessory sinuses: Secondary | ICD-10-CM | POA: Insufficient documentation

## 2011-12-25 LAB — URINE MICROSCOPIC-ADD ON

## 2011-12-25 LAB — POCT URINALYSIS DIPSTICK
Glucose, UA: NEGATIVE
Ketones, UA: NEGATIVE
Leukocytes, UA: NEGATIVE
Spec Grav, UA: 1.01

## 2011-12-25 LAB — POCT UA - MICROSCOPIC ONLY
Epithelial cells, urine per micros: NEGATIVE
Mucus, UA: POSITIVE
Yeast, UA: NEGATIVE

## 2011-12-25 LAB — POCT CBC
Granulocyte percent: 72.6 %G (ref 37–80)
MCH, POC: 28.7 pg (ref 27–31.2)
MCV: 90.8 fL (ref 80–97)
MID (cbc): 0.4 (ref 0–0.9)
MPV: 8.6 fL (ref 0–99.8)
POC LYMPH PERCENT: 21.8 %L (ref 10–50)
POC MID %: 5.6 %M (ref 0–12)
Platelet Count, POC: 218 10*3/uL (ref 142–424)
RBC: 5.13 M/uL (ref 4.04–5.48)
RDW, POC: 15.3 %
WBC: 6.4 10*3/uL (ref 4.6–10.2)

## 2011-12-25 LAB — URINALYSIS, ROUTINE W REFLEX MICROSCOPIC
Glucose, UA: NEGATIVE mg/dL
Specific Gravity, Urine: 1.015 (ref 1.005–1.030)
Urobilinogen, UA: 0.2 mg/dL (ref 0.0–1.0)

## 2011-12-25 LAB — POCT I-STAT, CHEM 8
Calcium, Ion: 1.24 mmol/L (ref 1.13–1.30)
Chloride: 99 mEq/L (ref 96–112)
Glucose, Bld: 114 mg/dL — ABNORMAL HIGH (ref 70–99)
HCT: 43 % (ref 36.0–46.0)
Hemoglobin: 14.6 g/dL (ref 12.0–15.0)
Potassium: 3.7 mEq/L (ref 3.5–5.1)

## 2011-12-25 LAB — PROTIME-INR
INR: 1.4 (ref 0.00–1.49)
Prothrombin Time: 16.8 seconds — ABNORMAL HIGH (ref 11.6–15.2)

## 2011-12-25 MED ORDER — HYDROCODONE-ACETAMINOPHEN 5-500 MG PO TABS: 1.0000 | ORAL_TABLET | Freq: Four times a day (QID) | ORAL | Status: DC | PRN

## 2011-12-25 MED ORDER — CYCLOBENZAPRINE HCL 5 MG PO TABS: 5.0000 mg | ORAL_TABLET | Freq: Three times a day (TID) | ORAL | Status: DC | PRN

## 2011-12-25 NOTE — ED Notes (Signed)
Onset of left flank pain yesterday, which she really did not think anything of. Woke during the night w/left flank pain, nausea w/o emesis, chills and sweats. Denies hematuria.

## 2011-12-25 NOTE — ED Provider Notes (Signed)
History     CSN: [id number]  Arrival date & time 12/25/11  0920   First MD Initiated Contact with Patient 12/25/11 [phone]      Chief Complaint  Patient presents with  . Flank Pain    (Consider location/radiation/quality/duration/timing/severity/associated sxs/prior treatment) HPI Pt presents from [place] urgent care due to concern for possible renal stone.  Pt describes having back pain since yesterday- she states she has had pain in low back before and getting in and out of the car yesterday seemed to worsen her back pain.  However last night she was awakened in the night with sharp left flank pain.  No fever/chills, no vomiting, no weakness of legs, no retention or incontinence of bowel or bladder.  States pain is not worse today with movement or palpation.  Denies dysuria/urgency/freuency.  Dr. Deforest Hoyles note mentions hx of renal stones, but patient states she has never had a renal stone.  There are no other associated systemic symptoms, there are no other alleviating or modifying factors.   Past Medical History  Diagnosis Date  . HTN (hypertension)   . Hyperlipidemia   . Cancer of ovary   . Anemia   . Basal cell carcinoma of nose   . Dvt femoral (deep venous thrombosis) 2008    left    Past Surgical History  Procedure Date  . Abdominal hysterectomy     unknown type  . Hernia repair 2007  . Basal cell carcinoma excision     resection with plastic surgery reconstrustion  . Ovarian cancer 2005  . Cataract surgery     bilateral  . Bilateral breast mass 1970    benigned, left breast    Family History  Problem Relation Age of Onset  . Alzheimer's disease Mother   . Hypertension Mother   . Alzheimer's disease Father   . Hypertension Father   . Colon cancer Father     1980    History  Substance Use Topics  . Smoking status: Never Smoker   . Smokeless tobacco: Never Used  . Alcohol Use: No    OB History    Grav Para Term Preterm Abortions TAB SAB Ect Mult Living                   Review of Systems ROS reviewed and all otherwise negative except for mentioned in HPI  Allergies  Valtrex; Benoxinate base; Sulfonamide derivatives; and Ivp dye  Home Medications   Current Outpatient Rx  Name  Route  Sig  Dispense  Refill  . ACETAMINOPHEN 325 MG PO TABS   Oral   Take 650 mg by mouth every 6 (six) hours as needed.         . ATORVASTATIN CALCIUM 40 MG PO TABS      TAKE 1 TABLET (40 MG TOTAL) BY MOUTH DAILY.   90 tablet   0   . CALCIUM CARBONATE 600 MG PO TABS   Oral   Take 600 mg by mouth 2 (two) times daily with a meal.           . CETIRIZINE HCL 10 MG PO TABS   Oral   Take 10 mg by mouth daily.         Marland Kitchen FERROUS SULFATE 325 (65 FE) MG PO TABS   Oral   Take 325 mg by mouth daily with breakfast.           . OMEGA-3 FATTY ACIDS 1000 MG PO CAPS   Oral  Take 2 g by mouth as needed.           Marland Kitchen HYDROCODONE-ACETAMINOPHEN 5-500 MG PO TABS   Oral   Take 1 tablet by mouth every 6 (six) hours as needed. pain         . LISINOPRIL-HYDROCHLOROTHIAZIDE 20-25 MG PO TABS      TAKE 1 TABLET EVERY DAY   90 tablet   0   . ONE-DAILY MULTI VITAMINS PO TABS   Oral   Take 1 tablet by mouth daily.           Marland Kitchen NIACIN 500 MG PO TABS   Oral   Take 500 mg by mouth daily with breakfast.           . PROBIOTIC DAILY PO   Oral   Take 1 capsule by mouth daily.         Marland Kitchen VITAMIN D (ERGOCALCIFEROL) 50000 UNITS PO CAPS   Oral   Take 50,000 Units by mouth every 7 (seven) days. Saturday AS DIRECTED         . VITAMIN E 400 UNITS PO CAPS   Oral   Take 400 Units by mouth daily.           . WARFARIN SODIUM 5 MG PO TABS   Oral   Take 7.5-10 mg by mouth See admin instructions. Pt takes 2 tablets of 5 mg for 10 mg dose on Monday Wednesday Friday. 7.5 ( 1.5 tab) on Tuesday Thursday Saturday Sunday         . CYCLOBENZAPRINE HCL 5 MG PO TABS   Oral   Take 1 tablet (5 mg total) by mouth 3 (three) times daily as needed for muscle  spasms.   20 tablet   0   . FLUOCINONIDE 0.05 % EX CREA      AS NEEDED         . HYDROCODONE-ACETAMINOPHEN 5-500 MG PO TABS   Oral   Take 1-2 tablets by mouth every 6 (six) hours as needed for pain.   15 tablet   0     BP 149/81  Pulse 80  Temp 97.7 F (36.5 C) (Oral)  Resp 18  Ht 5\' 7"  (1.702 m)  Wt 308 lb (139.708 kg)  BMI 48.24 kg/m2  SpO2 100% Vitals reviewed Physical Exam Physical Examination: General appearance - alert, well appearing, and in no distress Mental status - alert, oriented to person, place, and time Mouth - mucous membranes moist, pharynx normal without lesions Chest - clear to auscultation, no wheezes, rales or rhonchi, symmetric air entry Heart - normal rate, regular rhythm, normal S1, S2, no murmurs, rubs, clicks or gallops Abdomen - soft, nontender, nondistended, no masses or organomegaly Back exam - no midline spinal tenderness, some ttp in left paraspinous region Neurological - alert, oriented, normal speech, no focal findings or movement disorder noted Skin - normal coloration and turgor, no rashes  ED Course  Procedures (including critical care time)  Labs Reviewed  URINALYSIS, ROUTINE W REFLEX MICROSCOPIC - Abnormal; Notable for the following:    Hgb urine dipstick SMALL (*)     Leukocytes, UA MODERATE (*)     All other components within normal limits  PROTIME-INR - Abnormal; Notable for the following:    Prothrombin Time 16.8 (*)     All other components within normal limits  POCT I-STAT, CHEM 8 - Abnormal; Notable for the following:    Glucose, Bld 114 (*)     All other components  within normal limits  URINE MICROSCOPIC-ADD ON   Ct Abdomen Pelvis Wo Contrast  12/25/2011  *RADIOLOGY REPORT*  Clinical Data: Left flank pain, nausea.  Prior hysterectomy with history of ovarian cancer.  CT ABDOMEN AND PELVIS WITHOUT CONTRAST  Technique:  Multidetector CT imaging of the abdomen and pelvis was performed following the standard protocol  without intravenous contrast.  Comparison: 12/20/2006  Findings: Lung bases are essentially clear.  Moderate geographic hepatic steatosis.  Unenhanced spleen, pancreas, and adrenal glands are within normal limits.  Layering gallstones.  No associated inflammatory changes by CT.  No intrahepatic or extrahepatic ductal dilatation.  Kidneys are unremarkable.  No renal calculi or hydronephrosis.  No evidence of bowel obstruction.  Normal appendix.  Sigmoid diverticulosis, without associated inflammatory changes.  Atherosclerotic calcifications of the abdominal aorta and branch vessels.  No abdominopelvic ascites.  Prior retroperitoneal/pelvic lymphadenectomy.  No suspicious abdominopelvic lymphadenopathy.  Status post hysterectomy and bilateral salpingo-oophorectomy.  No ureteral or bladder calculi.  Stable midline ventral hernia containing a nondilated loop of small bowel (series 2/image 41).  Degenerative changes of the visualized thoracolumbar spine.  IMPRESSION: No renal, ureteral, or bladder calculi.  No hydronephrosis.  Moderate hepatic steatosis.  Cholelithiasis, without associated inflammatory changes.   Original Report Authenticated By: Charline Bills, M.D.    Dg Abd 1 View  12/25/2011  *RADIOLOGY REPORT*  Clinical Data: Left-sided pain, kidney stonesa  ABDOMEN - 1 VIEW  Comparison: None.  Findings: At least one 5 mm calcification overlying the left lower kidney.  No definite ureteral calculi.  Surgical clips overlying the pelvis  IMPRESSION: At least one 5 mm calcification overlying the left lower kidney.  No definite ureteral calculi.   Original Report Authenticated By: Charline Bills, M.D.      1. Back pain       MDM  Pt presenting with c/o left flank pain.  Was sent from ED for eval of possible renal stone.  CT scan negative, urine does have large leuks but no WBCs- will send for culture- pt has no dysuria/fever/vomiting so doubt UTI/pyelo.  Pt is comfortable after taking vicodin po x 1.   Discharged with strict return precautions.  Pt agreeable with plan.        Ethelda Chick, MD 12/25/11 1245

## 2011-12-25 NOTE — ED Notes (Signed)
Pt states that "urgent care sent me here for only a CT". CT in the Emergency department was called and saw no orders. Pt made aware of this.

## 2011-12-25 NOTE — Discharge Instructions (Signed)
Return to the ED with any concerns including fever, vomiting, weakness in legs, not able to urinate, loss of control of bowel or bladder, decreased level of alertness/lethargy, or any other alarming symptoms

## 2011-12-25 NOTE — Progress Notes (Signed)
Subjective:    Patient ID: [name], female    DOB: 1950-07-14, 61 y.o.   MRN: [id number]  HPI61 y.o. Female who presents with: Dull radiating back pain since 3 P.M.yesterday. Was shopping yesterday and a sudden pain occurred.  Excruciating pain localized on left side. Rates her pain from 1-10 as a 7-8. States she feels she could almost pass out.  When she moves her left hip around, she notes slight relief. She does have h/o kidney stones.  Took 3 acetaminophen yesterday, no improvement.   Review of Systems Results for orders placed in visit on 12/25/11  POCT URINALYSIS DIPSTICK      Component Value Range   Color, UA yellow     Clarity, UA clear     Glucose, UA neg     Bilirubin, UA neg     Ketones, UA neg     Spec Grav, UA 1.010     Blood, UA neg     pH, UA 6.5     Protein, UA neg     Urobilinogen, UA 0.2     Nitrite, UA neg     Leukocytes, UA Negative    POCT UA - MICROSCOPIC ONLY      Component Value Range   WBC, Ur, HPF, POC 0-1     RBC, urine, microscopic 0-2     Bacteria, U Microscopic trace     Mucus, UA pos     Epithelial cells, urine per micros neg     Crystals, Ur, HPF, POC neg     Casts, Ur, LPF, POC neg     Yeast, UA neg    POCT CBC      Component Value Range   WBC 6.3  4.6 - 10.2 K/uL   Lymph, poc 1.4  0.6 - 3.4   POC LYMPH PERCENT 21.9  10 - 50 %L   MID (cbc) 0.4  0 - 0.9   POC MID % 5.9  0 - 12 %M   POC Granulocyte 4.5  2 - 6.9   Granulocyte percent 72.2  37 - 80 %G   RBC 5.08  4.04 - 5.48 M/uL   Hemoglobin 14.3  12.2 - 16.2 g/dL   HCT, POC 74.2  59.5 - 47.9 %   MCV 91.0  80 - 97 fL   MCH, POC 28.1  27 - 31.2 pg   MCHC 31.0 (*) 31.8 - 35.4 g/dL   RDW, POC 63.8     Platelet Count, POC 228  142 - 424 K/uL   MPV 8.5  0 - 99.8 fL       Objective:   Physical Exam patient is somewhat flushed and appears to be in significant discomfort. She is holding her left flank area. There is no rash in this area. There is no true CVA tenderness. The  abdomen is protuberant but benign no masses palpable  [facility] reading (PRIMARY) by  Dr.Crawford Tamura there is a suspicious area adjacent to L4 on the left  which could represent a stone there are clips present in the abdomen from previous surgery        Assessment & Plan:  History and physical are most consistent with a kidney stone. The patient will have a CBC, urine, KUB. She's currently on the Coumadin for DVT. Patient is told she could go ahead and take one of her Vicodin here. She is being sent to [facility] long emergency room to be evaluated to be considered for a noncontrast CT . Marland Kitchen

## 2011-12-27 ENCOUNTER — Telehealth: Payer: Self-pay | Admitting: Pharmacist

## 2011-12-27 NOTE — Telephone Encounter (Signed)
[name] was concerned about her INR. It was 1.4 at her last visit on 12/4, and her dose was increased at this time. She went to the ED at [facility] on 12/14 where it was again 1.4.  She has an appointment on Wednesday of this week (12/18).  Advised her to take an extra half a tablet today and tomorrow and to follow up at her appointment on Wednesday.

## 2011-12-29 ENCOUNTER — Ambulatory Visit (INDEPENDENT_AMBULATORY_CARE_PROVIDER_SITE_OTHER): Payer: BC Managed Care – PPO | Admitting: *Deleted

## 2011-12-29 DIAGNOSIS — I82409 Acute embolism and thrombosis of unspecified deep veins of unspecified lower extremity: Secondary | ICD-10-CM

## 2011-12-29 LAB — POCT INR: INR: 1.8

## 2012-01-17 ENCOUNTER — Ambulatory Visit (INDEPENDENT_AMBULATORY_CARE_PROVIDER_SITE_OTHER): Payer: BC Managed Care – PPO | Admitting: *Deleted

## 2012-01-17 DIAGNOSIS — I82409 Acute embolism and thrombosis of unspecified deep veins of unspecified lower extremity: Secondary | ICD-10-CM

## 2012-02-04 ENCOUNTER — Ambulatory Visit (INDEPENDENT_AMBULATORY_CARE_PROVIDER_SITE_OTHER): Payer: BC Managed Care – PPO | Admitting: *Deleted

## 2012-02-04 DIAGNOSIS — I82409 Acute embolism and thrombosis of unspecified deep veins of unspecified lower extremity: Secondary | ICD-10-CM

## 2012-03-03 ENCOUNTER — Ambulatory Visit (INDEPENDENT_AMBULATORY_CARE_PROVIDER_SITE_OTHER): Payer: BC Managed Care – PPO | Admitting: *Deleted

## 2012-03-03 DIAGNOSIS — I82409 Acute embolism and thrombosis of unspecified deep veins of unspecified lower extremity: Secondary | ICD-10-CM

## 2012-03-03 LAB — POCT INR: INR: 2.5

## 2012-03-07 ENCOUNTER — Other Ambulatory Visit: Payer: Self-pay | Admitting: Physician Assistant

## 2012-03-19 ENCOUNTER — Other Ambulatory Visit: Payer: Self-pay | Admitting: Physician Assistant

## 2012-03-27 ENCOUNTER — Other Ambulatory Visit: Payer: Self-pay | Admitting: *Deleted

## 2012-03-27 MED ORDER — WARFARIN SODIUM 5 MG PO TABS: 5.0000 mg | ORAL_TABLET | ORAL | Status: DC

## 2012-04-14 ENCOUNTER — Ambulatory Visit (INDEPENDENT_AMBULATORY_CARE_PROVIDER_SITE_OTHER): Payer: BC Managed Care – PPO | Admitting: *Deleted

## 2012-04-14 DIAGNOSIS — I82409 Acute embolism and thrombosis of unspecified deep veins of unspecified lower extremity: Secondary | ICD-10-CM

## 2012-04-14 LAB — POCT INR: INR: 2.5

## 2012-05-26 ENCOUNTER — Ambulatory Visit (INDEPENDENT_AMBULATORY_CARE_PROVIDER_SITE_OTHER): Payer: BC Managed Care – PPO | Admitting: Pharmacist

## 2012-05-26 DIAGNOSIS — I82409 Acute embolism and thrombosis of unspecified deep veins of unspecified lower extremity: Secondary | ICD-10-CM

## 2012-05-31 ENCOUNTER — Encounter: Payer: Self-pay | Admitting: *Deleted

## 2012-06-01 ENCOUNTER — Ambulatory Visit (INDEPENDENT_AMBULATORY_CARE_PROVIDER_SITE_OTHER): Payer: BC Managed Care – PPO | Admitting: Obstetrics & Gynecology

## 2012-06-01 ENCOUNTER — Encounter: Payer: Self-pay | Admitting: Obstetrics & Gynecology

## 2012-06-01 ENCOUNTER — Telehealth: Payer: Self-pay | Admitting: Obstetrics & Gynecology

## 2012-06-01 VITALS — BP 126/80 | HR 84 | Ht 66.25 in | Wt 311.0 lb

## 2012-06-01 DIAGNOSIS — Z01419 Encounter for gynecological examination (general) (routine) without abnormal findings: Secondary | ICD-10-CM

## 2012-06-01 DIAGNOSIS — Z8543 Personal history of malignant neoplasm of ovary: Secondary | ICD-10-CM

## 2012-06-01 MED ORDER — NYSTATIN 100000 UNIT/GM EX CREA: TOPICAL_CREAM | Freq: Two times a day (BID) | CUTANEOUS | Status: DC

## 2012-06-01 MED ORDER — NYSTATIN 100000 UNIT/GM EX POWD: Freq: Three times a day (TID) | CUTANEOUS | Status: DC

## 2012-06-01 NOTE — Patient Instructions (Signed)

## 2012-06-01 NOTE — Telephone Encounter (Signed)
Patient called to speak with Tresa Endo in regards to questions that she was asked about her sexual preference and incorrect information that she listed. She said that she specifically spoke with her and wants to change that information.

## 2012-06-01 NOTE — Progress Notes (Signed)
Patient ID: [name], female   DOB: 1950-04-28, 62 y.o.   MRN: [id number]  62 y.o. G0P0 SingleCaucasianF here for annual exam.  Doing well.  No vaginal bleeding.  Blood work every six months with PCP.  Does have regular screening for her INR.  Goal is 2.5 - 3.5.  Major issue trouble walking.  Has had stomach and back xrays, CT, MRI of neck, acupuncture, and now using orthotics.  No LMP recorded. Patient is postmenopausal.          Sexually active: no  The current method of family planning is none.    Exercising: no  The patient does not participate in regular exercise at present. Smoker:  no  Health Maintenance: Pap:  H/o TAH/BSO History of abnormal Pap:  No MMG:  08/31/11, normal Colonoscopy:  02/2009, tubular adenoma, f/u 5 years BMD:   07/2007, normal TDaP:  2003 Screening Labs: PCP, Hb today: PCP, Urine today: PCP   reports that she has never smoked. She has never used smokeless tobacco. She reports that she does not drink alcohol or use illicit drugs.  Past Medical History  Diagnosis Date  . HTN (hypertension)   . Hyperlipidemia   . Cancer of ovary   . Anemia   . Basal cell carcinoma of nose   . Dvt femoral (deep venous thrombosis) 2008    left    Past Surgical History  Procedure Laterality Date  . Abdominal hysterectomy      unknown type  . Hernia repair  2007  . Basal cell carcinoma excision      resection with plastic surgery reconstrustion  . Ovarian cancer  2005  . Cataract surgery      bilateral  . Bilateral breast mass  1970    benigned, left breast    Current Outpatient Prescriptions  Medication Sig Dispense Refill  . atorvastatin (LIPITOR) 40 MG tablet TAKE 1 TABLET BY MOUTH EVERY DAY  90 tablet  0  . calcium carbonate (OS-CAL) 600 MG TABS Take 600 mg by mouth 2 (two) times daily with a meal.        . cetirizine (ZYRTEC) 10 MG tablet Take 10 mg by mouth daily.      . ferrous sulfate 325 (65 FE) MG tablet Take 325 mg by mouth daily with breakfast.         . fish oil-omega-3 fatty acids 1000 MG capsule Take 2 g by mouth as needed.        Marland Kitchen lisinopril-hydrochlorothiazide (PRINZIDE,ZESTORETIC) 20-25 MG per tablet TAKE 1 TABLET BY MOUTH EVERY DAY  90 tablet  0  . Multiple Vitamin (MULTIVITAMIN) tablet Take 1 tablet by mouth daily.        . niacin 500 MG tablet Take 500 mg by mouth daily with breakfast.        . Vitamin D, Ergocalciferol, (DRISDOL) 50000 UNITS CAPS Take 50,000 Units by mouth every 7 (seven) days. Saturday AS DIRECTED      . vitamin E 400 UNIT capsule Take 400 Units by mouth daily.        Marland Kitchen warfarin (COUMADIN) 5 MG tablet Take 1 tablet (5 mg total) by mouth as directed.  150 tablet  1  . acetaminophen (TYLENOL) 325 MG tablet Take 650 mg by mouth every 6 (six) hours as needed.       No current facility-administered medications for this visit.    Family History  Problem Relation Age of Onset  . Alzheimer's disease Mother   .  Hypertension Mother   . Alzheimer's disease Father   . Hypertension Father   . Colon cancer Father     1980    ROS:  Pertinent items are noted in HPI.  Otherwise, a comprehensive ROS was negative.  Exam:   BP 126/80  Pulse 84  Ht 5' 6.25" (1.683 m)  Wt 311 lb (141.069 kg)  BMI 49.8 kg/m2  Weight change: +17lbs  Height: 5' 6.25" (168.3 cm)  Ht Readings from Last 3 Encounters:  06/01/12 5' 6.25" (1.683 m)  12/25/11 5\' 7"  (1.702 m)  11/24/11 5\' 7"  (1.702 m)    General appearance: alert, cooperative and appears stated age Head: Normocephalic, without obvious abnormality, atraumatic Neck: no adenopathy, supple, symmetrical, trachea midline and thyroid normal to inspection and palpation Lungs: clear to auscultation bilaterally Breasts: normal appearance, no masses or tenderness Heart: regular rate and rhythm Abdomen: soft, non-tender; bowel sounds normal; no masses,  no organomegaly Extremities: extremities normal, atraumatic, no cyanosis or edema Skin: Skin color, texture, turgor normal. No  rashes or lesions Lymph nodes: Cervical, supraclavicular, and axillary nodes normal. No abnormal inguinal nodes palpated Neurologic: Grossly normal   Pelvic: External genitalia:  no lesions              Urethra:  normal appearing urethra with no masses, tenderness or lesions              Bartholins and Skenes: normal                 Vagina: normal appearing vagina with normal color and discharge, no lesions              Cervix: absent              Pap taken: no Bimanual Exam:  Uterus:  uterus absent              Adnexa: no mass, fullness, tenderness               Rectovaginal: Confirms               Anus:  normal sphincter tone, no lesions  A:  Well Woman with normal exam H/O Ovarian cancer, 1A.  S/p TAH/BSO. H/O large left LE DVT.  On chronic anticoagulation. Obesity Family hx of breast cancer (niece).   Skin candida  P:   Mammogram yearly.  Will check with [facility] about 3D MMG for pt No pap smear.  H/O TAH/BSO after treatment for ovarian cancer Decided to do BRCA testing today.   Ca-125 yearly.  Have checked with Dr. Stanford Breed regarding this.  No additional testing needed. Nystatin cream and power rxs to pt. Knows tetenus is due.  Declines but will do if has a risk. return annually or prn  An After Visit Summary was printed and given to the patient.

## 2012-06-02 ENCOUNTER — Telehealth: Payer: Self-pay

## 2012-06-02 LAB — CA 125: CA 125: 4 U/mL (ref 0.0–30.2)

## 2012-06-02 NOTE — Telephone Encounter (Signed)
Patient called to let Dr Hyacinth Meeker know her INR range was 2.0-3.0. Her results were on the higher end this time.

## 2012-06-09 ENCOUNTER — Other Ambulatory Visit: Payer: Self-pay | Admitting: Physician Assistant

## 2012-06-21 ENCOUNTER — Other Ambulatory Visit: Payer: Self-pay | Admitting: Physician Assistant

## 2012-06-21 ENCOUNTER — Telehealth: Payer: Self-pay | Admitting: *Deleted

## 2012-06-21 NOTE — Telephone Encounter (Signed)
BRCA test results received and sent to your office with paper chart.  No Mutation detected. Patient has not been notified.

## 2012-06-21 NOTE — Telephone Encounter (Signed)
Please let me review this before calling pt.  Thanks.

## 2012-06-22 ENCOUNTER — Encounter: Payer: Self-pay | Admitting: Obstetrics & Gynecology

## 2012-06-22 NOTE — Telephone Encounter (Signed)
Per Dr Rondel Baton written note on report , call to patient to notify that BRCA testing is negative and we will mail her copy for her records. Patient wondering if dr Hyacinth Meeker was able to find out about MMG referral for additional test that she was not able to have previously due to "wide breast".  Is she referring to 3D? Should I call Breast Center?

## 2012-07-04 NOTE — Telephone Encounter (Signed)
Please let pt know that Breast center cannot guarantee that they can do a 3D.  They do not have a bra size that they say is too big.  They have to wait until the patient is there for the imaging.  [facility], however, does them no matter what the breast size.  She may want to have her next MMG at [facility].  Please give her the number.  She does not need an order from me for this.  She can just request.

## 2012-07-05 ENCOUNTER — Ambulatory Visit (INDEPENDENT_AMBULATORY_CARE_PROVIDER_SITE_OTHER): Payer: BC Managed Care – PPO | Admitting: Neurology

## 2012-07-05 ENCOUNTER — Other Ambulatory Visit: Payer: Self-pay | Admitting: Physician Assistant

## 2012-07-05 ENCOUNTER — Encounter: Payer: Self-pay | Admitting: Neurology

## 2012-07-05 DIAGNOSIS — G4730 Sleep apnea, unspecified: Secondary | ICD-10-CM

## 2012-07-05 DIAGNOSIS — E662 Morbid (severe) obesity with alveolar hypoventilation: Secondary | ICD-10-CM

## 2012-07-05 NOTE — Progress Notes (Signed)
[facility]  Provider:  Dr [name] Referring Provider: Collene Gobble, MD Primary Care Physician:  Kathleen Edin, MD  Chief Complaint  Patient presents with  . Follow-up    OSA, rm 10    HPI:  [name] is a 62 y.o. female here as a referral from Dr. Cleta [name] for  Follow up on sleep apnea.   [name] a mean but 63 year old, [name], right-handed female patient of Dr. Gordy [name] is followed here for a sleep apnea. The patient underwent a split study on 9-21 2012, after complaining of excessive daytime fatigue, sleepiness, witnessed apneas and fragmented sleep at night she also was easily short of breath. The patient at the past medical history of morbid obesity, hypertension and hyperlipidemia. At the time of her sleep study her BMI was 46.7 and neck circumference 17 inches her blood pressure was also slightly elevated 141/81 mm Hg.  The patient had been on Coumadin for DVT at the time.   She tested positive for an AHI of 120 and had to be immediately titrated to a pressure of 9 cm water was reduced for AHI to 0.0 he also was able to enter REM sleep. There was borderline hypoxia noted with hypoventilation and periodic limb movements were also very frequent at night.  In February bry 2013 she followed up  90 days download- and was using CPAP nightly , had not gained weight and a download revealed a residual AHI on CPAP of 8 cm water of 1.6 . She has meanwhile tried to further was weight especially since he also has had pain and pain due to arthritis. A download from 4-17 2013 showed an AHI of 1.1, average daily usage of 6 hours 56 minutes set pressure of 8 cm water with a 2 cm EPR.  Today her Epworth sleepiness score is 4 points her fatigue score 25 and the patient has continued to use compliantly her CPAP. We were unable to obtain a download in the office today. The patient reports her normal sleep time is from 12:30 midnight to 7:30 AM, she normally falls asleep within 5  minutes. She has no longer nocturia but had nocturia before starting CPAP. She averages about 7 hours of sleep at night. When she wakes up she feels restored, she still takes one nap daily but after lunch about the duration of 30-45 minutes. She reports laughingly that her whole family naps ! The nap is perceived as refreshing. Does not recall any dreams or nightmares or any dreams activity sleepwalking or sleep talking. She does not snore through the machine. She retired  since last visit and is free to nap now. She is losing weight, about 6 pounds a month.   Review of Systems: Out of a complete 14 system review, the patient complains of only the following symptoms, and all other reviewed systems are negative. Compliance download could not be obtained today but the patient is 100% compliant in her last download - average daily usage 6 hours and 56 minutes residual AHI 1.1 in 2013.    History   Social History  . Marital Status: Single    Spouse Name: N/A    Number of Children: 2  . Years of Education: masters   Occupational History  . 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 Smoking status: Never Smoker   . Smokeless tobacco: Never Used  . Alcohol Use: No  . Drug Use: No  .  Sexually Active: No   Other Topics Concern  . 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 PSH showed an AHI of 120!!!! titrated to 8 cm watr cPAP , residual AHI of 1 and downlaod was done  01-23-11 .  excellent compliance - 7 hours and low leak,  nasal pillow mask.    BMI is considered morbidly obese.  discussed low carb  diet - weight watchers and restricted exercise. , aquatic .    Patient cannot exercise due to soreness in proximal muscles.and  would like to add coenzyme q 10 and carnitine . She needs a medical weight loss regimen - bariatric surgery?       FSS 48, Epworth 4 again ,  CMS compliant  residual AHI  1.1 and average use 6.48  hours , no naps .           Family History  Problem Relation Age of Onset  . Alzheimer's disease Mother   . Hypertension Mother   . Alzheimer's disease Father   . Hypertension Father   . Colon cancer Father     31  . Hypertension Sister   . Hypertension Brother     Past Medical History  Diagnosis Date  . HTN (hypertension)   . Hyperlipidemia   . Femoral DVT (deep venous thrombosis) 2008    left  . OSA (obstructive sleep apnea)     hypoventilation, on CPAP  . DVT (deep vein thrombosis) in pregnancy     LEFT LEG  . Bronchitis   . 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 Basal cell carcinoma of nose   . Shingles     Past Surgical History  Procedure Laterality Date  . Total abdominal hysterectomy w/ bilateral salpingoophorectomy  2005    Stage IA ovarian cancer  . Hernia repair  2008  . Basal cell carcinoma excision      resection with plastic surgery reconstrustion  . Cataract surgery Bilateral [id number], 2005    with bilateral lens replacements  . Bilateral breast mass  1970    benigned, left breast    Current Outpatient Prescriptions  Medication Sig Dispense Refill  . atorvastatin (LIPITOR) 40 MG tablet Take 1 tablet (40 mg total) by mouth daily. PATIENT NEEDS OFFICE VISIT FOR ADDITIONAL REFILLS  30 tablet  0  . calcium carbonate (OS-CAL) 600 MG TABS Take 600 mg by mouth 2 (two) times daily with a meal.        . cetirizine (ZYRTEC) 10 MG tablet Take 10 mg by mouth daily.      . ferrous sulfate 325 (65 FE) MG tablet Take 325 mg by mouth daily with breakfast.        . fish oil-omega-3 fatty acids 1000 MG capsule Take 2 g by mouth as needed.        Marland Kitchen lisinopril-hydrochlorothiazide (PRINZIDE,ZESTORETIC) 20-25 MG per tablet TAKE 1 TABLET BY MOUTH EVERY DAY  90 tablet  0  . Multiple Vitamin (MULTIVITAMIN) tablet Take 1 tablet by mouth daily.        . niacin 500 MG tablet Take 500 mg by mouth daily with breakfast.        .  Vitamin D, Ergocalciferol, (DRISDOL) 50000 UNITS CAPS Take 50,000 Units by mouth every 7 (seven) days. Saturday AS DIRECTED      . vitamin E 400 UNIT capsule Take 400 Units by  mouth daily.        Marland Kitchen warfarin (COUMADIN) 5 MG tablet Take 1 tablet (5 mg total) by mouth as directed.  150 tablet  1   No current facility-administered medications for this visit.    Allergies as of 07/05/2012 - Review Complete 07/05/2012  Allergen Reaction Noted  . Valtrex (valacyclovir hcl) Swelling and Rash 10/20/2011  . Benoxinate base  12/04/2010  . Sulfonamide derivatives  07/18/2008  . Ivp dye (iodinated diagnostic agents) Rash 12/04/2010    Vitals: BP 110/72  Pulse 102  Temp(Src) 99.5 F (37.5 C) (Oral)  Resp 16  Ht 5\' 7"  (1.702 m)  Wt 312 lb (141.522 kg)  BMI 48.85 kg/m2 Last Weight:  Wt Readings from Last 1 Encounters:  07/05/12 312 lb (141.522 kg)   Last Height:   Ht Readings from Last 1 Encounters:  07/05/12 5\' 7"  (1.702 m)     Physical exam:  General: The patient is awake, alert and appears not in acute distress. The patient is well groomed. Head: Normocephalic, atraumatic. Neck is supple. Mallampati 4 , neck circumference: 17.25 , "- no retrognathia.  Cardiovascular:  Regular rate and rhythm, without  murmurs or carotid bruit, and without distended neck veins. Respiratory: Lungs are clear to auscultation. Patient has restricted deep breathing.  Skin:  Without evidence of edema, or rash.  Trunk: BMI is morbidly obese , has normal posture.  Neurologic exam : The patient is awake and alert, oriented to place and time.  Memory subjective  described as intact. There is a normal attention span & concentration ability.  Speech is fluent without dysarthria, dysphonia or aphasia. Mood and affect are appropriate.  Cranial nerves: Pupils are equal and briskly reactive to light. Funduscopic exam without  evidence of pallor or edema. Extraocular movements  in vertical and horizontal planes  intact and without nystagmus. Visual fields by finger perimetry are intact. Hearing to finger rub intact.  Facial sensation intact to fine touch. Facial motor strength is symmetric and tongue and uvula move midline.  Motor exam:   Normal tone and normal muscle bulk and symmetric normal strength in all extremities. Crepitation over knees , pain with ROM in her hips.   Sensory:  Fine touch, pinprick and vibration were tested in all extremities. Coordination: Rapid alternating movements in the fingers/hands is  normal. without evidence of ataxia, dysmetria or tremor.  Gait and station: Patient walks without assistive device  .Marland Kitchen   Assessment:  1) After physical and neurologic examination, review of pre-existing records, assessment   Is that of obstructive sleep apnea with obesity hypoventilation overlap. Severe AHI of 120 in the patient baseline study completely corrected of only 8 cm of water pressure and no additional oxygen was prescribed the residual AHI was 1.1 the average daily usage 6 hours and 56 minutes in 2013. Continue using at the current pressure.  #2 reduction off body mass index. I suggested to the patient to look into the 2 and 5 day diet, although known as  intermittent fast.  The patient would be restricted to 500 kcal for nonconsecutive days a week, and has no calorie restrictions on the other days. Is encouraged to hydrate well, and to keep her vitamin K intake in mind , as she continues to be on warfarin.

## 2012-07-05 NOTE — Addendum Note (Signed)
Addended by: Melvyn Novas on: 07/05/2012 03:37 PM   Modules accepted: Orders

## 2012-07-06 ENCOUNTER — Encounter: Payer: Self-pay | Admitting: Neurology

## 2012-07-06 ENCOUNTER — Other Ambulatory Visit: Payer: Self-pay | Admitting: Physician Assistant

## 2012-07-06 NOTE — Telephone Encounter (Signed)
A negative test does not remove any risk due to family history but, yes, her personal risk is lower because of this.  Insurance will cover all but $50 of the 3D MMG.

## 2012-07-06 NOTE — Telephone Encounter (Signed)
Patient notified of information re: BMD and MMG. States the [facility] is where she originally went that told her they could not do it. She is going to check with her insurance to make sure it would be covered at [facility]. She is aware she can make the appointments, but will need to let us know so we can fax order for BMD. Also, was questioning the BRCA results being negative for the mutation gene and does that put her at a lower risk for breast cancer? Please advise.

## 2012-07-07 NOTE — Telephone Encounter (Signed)
Patient is going to call [facility] next week to schedule both the MMG and BMD. Aware we will fax order to them.

## 2012-07-10 ENCOUNTER — Telehealth: Payer: Self-pay | Admitting: Obstetrics & Gynecology

## 2012-07-10 NOTE — Telephone Encounter (Signed)
Spoke with pt about 3D MMG. Pt has had MMG's at [facility] in the past, but they were unable to do a 3D because their machinery could not accommodate her size. Pt is interested in going to [facility] to have it done,as they have told her they have never had a pt they could not accommodate. Pt also inquiring about whether she needs another BMD. She thinks it has been about 7 years since her last one. Please advise. Chart in your office.  aa

## 2012-07-10 NOTE — Telephone Encounter (Signed)
Spoke with pt to advise an order for BMD was sent. Pt appreciative. As far as 3D MMG,  pt thinks her insurance requires the doctor to request a 3D MMG in order for them to cover it. Would SM be OK with requesting this?

## 2012-07-10 NOTE — Telephone Encounter (Signed)
Patient wants to discuss getting a 3D imaging mammogram.

## 2012-07-10 NOTE — Telephone Encounter (Signed)
Please call and let her know BMD order faxed.

## 2012-07-10 NOTE — Telephone Encounter (Signed)
Yes.  Is she going to [facility] for her MMG.  We can fax and order with the specific reasoning for 3D and if there is more than a $50 cost (what everybody is having to pay right now), I can write a letter to her insurance company.  Just let me know if she is going to go to [place].

## 2012-07-11 ENCOUNTER — Telehealth: Payer: Self-pay | Admitting: Neurology

## 2012-07-11 NOTE — Telephone Encounter (Signed)
Tresa Endo faxed this.

## 2012-07-11 NOTE — Telephone Encounter (Signed)
Pt does want to go to [place]. I will bring a [facility] form filled out for you to sign.

## 2012-07-11 NOTE — Telephone Encounter (Signed)
Called again , she had good advise for my EPIC problem.

## 2012-07-12 ENCOUNTER — Telehealth: Payer: Self-pay

## 2012-07-12 MED ORDER — ATORVASTATIN CALCIUM 40 MG PO TABS: 40.0000 mg | ORAL_TABLET | Freq: Every day | ORAL | Status: DC

## 2012-07-12 NOTE — Telephone Encounter (Signed)
Patient has an appointment set with Dr. Cleta Alberts in early August, but she needs a refill on her norvastatin.  Can we call her in a refill on this?  [phone]

## 2012-07-13 ENCOUNTER — Ambulatory Visit (INDEPENDENT_AMBULATORY_CARE_PROVIDER_SITE_OTHER): Payer: BC Managed Care – PPO | Admitting: *Deleted

## 2012-07-13 ENCOUNTER — Ambulatory Visit (INDEPENDENT_AMBULATORY_CARE_PROVIDER_SITE_OTHER): Payer: BC Managed Care – PPO | Admitting: Cardiovascular Disease

## 2012-07-13 ENCOUNTER — Encounter: Payer: Self-pay | Admitting: Cardiovascular Disease

## 2012-07-13 VITALS — BP 118/80 | HR 92 | Wt 311.0 lb

## 2012-07-13 DIAGNOSIS — I82409 Acute embolism and thrombosis of unspecified deep veins of unspecified lower extremity: Secondary | ICD-10-CM

## 2012-07-13 LAB — CBC
Hemoglobin: 13.9 g/dL (ref 12.0–15.0)
MCHC: 34 g/dL (ref 30.0–36.0)
RDW: 14.9 % — ABNORMAL HIGH (ref 11.5–14.6)
WBC: 4.1 10*3/uL — ABNORMAL LOW (ref 4.5–10.5)

## 2012-07-13 LAB — BASIC METABOLIC PANEL
CO2: 34 mEq/L — ABNORMAL HIGH (ref 19–32)
Chloride: 101 mEq/L (ref 96–112)
Potassium: 3.7 mEq/L (ref 3.5–5.1)
Sodium: 139 mEq/L (ref 135–145)

## 2012-07-13 LAB — POCT INR: INR: 2

## 2012-07-13 MED ORDER — RIVAROXABAN 20 MG PO TABS: 20.0000 mg | ORAL_TABLET | Freq: Every day | ORAL | Status: DC

## 2012-07-13 NOTE — Progress Notes (Signed)
Patient ID: [name], female   DOB: February 02, 1950, 62 y.o.   MRN: [id number] [name] is seen today for F/U of post-phlebitic syndrome, coumadin Rx and abnormal ECG. She is an ovarian cancer survivor out 5 years now. Her initial extensive DVT was in the setting of ovarian CA. She has had multiple F/U duplex showing persistan clot and recanalizatoin. With her weight we have elected to keep her on coumadin. She has had stable Rx doses and I think it would be ok to let her INR be checked every 6-8 weeks. We discussed Pradaxa but I don't like the idea that it is not reversable. With her meds and compressoin hose her edema is stable. She has had an abnromal ECG in the past with poor R wave progression but normal EF and I suspect it is from lead placement and body habitus No change on ECG today  Long discussion about bariatric surgery. She has had and open hysterectomy and mesh repair of hernia but would benefit from such surgery. She has now been Dx with servere sleep apnea and on CPAP Not interested in surgery Had injection of right knee with Deveshwar and improved but will eventually need bilateral knee replacements  Discussed xarelto with indication for DVT    ROS: Denies fever, malais, weight loss, blurry vision, decreased visual acuity, cough, sputum, SOB, hemoptysis, pleuritic pain, palpitaitons, heartburn, abdominal pain, melena, lower extremity edema, claudication, or rash.  All other systems reviewed and negative  General: Affect appropriate Obese white female HEENT: normal Neck supple with no adenopathy JVP normal no bruits no thyromegaly Lungs clear with no wheezing and good diaphragmatic motion Heart:  S1/S2 no murmur, no rub, gallop or click PMI normal Abdomen: benighn, BS positve, no tenderness, no AAA no bruit.  No HSM or HJR Distal pulses intact with no bruits Plus 2 RLE and plus 3 LLE  edema Neuro non-focal Skin warm and dry No muscular weakness   Current Outpatient  Prescriptions  Medication Sig Dispense Refill  . atorvastatin (LIPITOR) 40 MG tablet Take 1 tablet (40 mg total) by mouth daily. PATIENT NEEDS OFFICE VISIT FOR ADDITIONAL REFILLS  30 tablet  0  . calcium carbonate (OS-CAL) 600 MG TABS Take 600 mg by mouth 2 (two) times daily with a meal.        . cetirizine (ZYRTEC) 10 MG tablet Take 10 mg by mouth daily.      . ferrous sulfate 325 (65 FE) MG tablet Take 325 mg by mouth daily with breakfast.        . fish oil-omega-3 fatty acids 1000 MG capsule Take 2 g by mouth as needed.        Marland Kitchen lisinopril-hydrochlorothiazide (PRINZIDE,ZESTORETIC) 20-25 MG per tablet TAKE 1 TABLET BY MOUTH EVERY DAY  90 tablet  0  . Multiple Vitamin (MULTIVITAMIN) tablet Take 1 tablet by mouth daily.        . niacin 500 MG tablet Take 500 mg by mouth daily with breakfast.        . Vitamin D, Ergocalciferol, (DRISDOL) 50000 UNITS CAPS Take 50,000 Units by mouth every 7 (seven) days. Saturday AS DIRECTED      . vitamin E 400 UNIT capsule Take 400 Units by mouth daily.        Marland Kitchen warfarin (COUMADIN) 5 MG tablet Take 1 tablet (5 mg total) by mouth as directed.  150 tablet  1   No current facility-administered medications for this visit.    Allergies  Valtrex; Benoxinate base;  Sulfonamide derivatives; and Ivp dye  Electrocardiogram:  11/13  SR rate 72 low voltage   Assessment and Plan

## 2012-07-13 NOTE — Assessment & Plan Note (Signed)
Still not interested in bariatric surgery Low carb diet discussed

## 2012-07-13 NOTE — Assessment & Plan Note (Signed)
Pros and Cons of novel agents discussed We both agreed xarelto would be better.  Check INR today if less than 3 hold coumadin and start xarelto tonight CBC and BMET today  Continue diuretic.  Support hose Will get custom shoes.arches made

## 2012-07-13 NOTE — Assessment & Plan Note (Signed)
Well controlled.  Continue current medications and low sodium Dash type diet.    

## 2012-07-13 NOTE — Patient Instructions (Addendum)
Please stop Coumadin Start Xarelto 20 mg a day Continue all other medications as listed  Please have blood work today  (CBC and BMP)  Follow up in 6 months with Dr Eden Emms.  You will receive a letter in the mail 2 months before you are due.  Please call us when you receive this letter to schedule your follow up appointment.

## 2012-07-17 ENCOUNTER — Telehealth: Payer: Self-pay | Admitting: Obstetrics & Gynecology

## 2012-07-17 NOTE — Telephone Encounter (Signed)
Patient is calling to ask about her Bone Density test and if it has been scheduled.

## 2012-07-18 NOTE — Telephone Encounter (Signed)
Has been 5 years.  It's time to do another one.

## 2012-07-18 NOTE — Telephone Encounter (Signed)
Patient has her MMG and BMD both scheduled at [facility] on 07/26/12. She called just to make sure that a BMD was definitely needed, she could not remember if Dr Hyacinth Meeker said she did or did not need it this year. Please advise and I will fax another order to [facility] do not have the one I recently faxed.

## 2012-07-19 NOTE — Telephone Encounter (Signed)
BMD order faxed again to [facility].

## 2012-08-08 ENCOUNTER — Other Ambulatory Visit: Payer: Self-pay | Admitting: Physician Assistant

## 2012-08-15 ENCOUNTER — Encounter: Payer: Self-pay | Admitting: Emergency Medicine

## 2012-08-15 ENCOUNTER — Ambulatory Visit (INDEPENDENT_AMBULATORY_CARE_PROVIDER_SITE_OTHER): Payer: BC Managed Care – PPO | Admitting: Emergency Medicine

## 2012-08-15 VITALS — BP 148/88 | HR 82 | Temp 97.9°F | Resp 16 | Ht 67.0 in | Wt 313.0 lb

## 2012-08-15 DIAGNOSIS — E782 Mixed hyperlipidemia: Secondary | ICD-10-CM

## 2012-08-15 DIAGNOSIS — I10 Essential (primary) hypertension: Secondary | ICD-10-CM

## 2012-08-15 DIAGNOSIS — I82409 Acute embolism and thrombosis of unspecified deep veins of unspecified lower extremity: Secondary | ICD-10-CM

## 2012-08-15 DIAGNOSIS — M79606 Pain in leg, unspecified: Secondary | ICD-10-CM

## 2012-08-15 LAB — LIPID PANEL
HDL: 46 mg/dL (ref >39)
Total CHOL/HDL Ratio: 3.5 Ratio
VLDL: 34 mg/dL (ref 0–40)

## 2012-08-15 LAB — COMPREHENSIVE METABOLIC PANEL
AST: 29 U/L (ref 0–37)
Alkaline Phosphatase: 91 U/L (ref 39–117)
Glucose, Bld: 96 mg/dL (ref 70–99)
Sodium: 139 mEq/L (ref 135–145)
Total Bilirubin: 0.7 mg/dL (ref 0.3–1.2)
Total Protein: 6.9 g/dL (ref 6.0–8.3)

## 2012-08-15 NOTE — Progress Notes (Signed)
  Subjective:    Patient ID: [name], female    DOB: [birth year], 62 y.o.   MRN: [id number]  HPI patient is here today to discuss chronic problems with her feet and legs. She has chronic swelling in her legs and wear support stockings. She has been to Dr. Althea Charon and then fitted with orthotics and subsequently referred to good feeds by Dr. Lilian Coma to she continues to have aching in her legs. She recently was changed from Coumadin to use her elbow so this has improved her physician visit situation. She is interested in physical therapy but states she [facility] the [facility] she gets to be 63    Review of Systems     Objective:   Physical Exam patient is alert and cooperative. I repeated her blood pressure is 126/84. Her chest was clear her heart was regular rate without murmurs. She has significant degenerative changes of both knees. She is wearing support stockings on both legs.        Assessment & Plan:  Routine labs were done today. She is requesting referral to Margretta Ditty who is a physical therapist at [facility]. Apparently they have access to aquatic therapy there and she is interested in pursuing this. CBC lipids and see Matt were done today to followup on her antihypertensives and statin therapy.

## 2012-08-16 LAB — CBC WITH DIFFERENTIAL/PLATELET
Eosinophils Absolute: 0.1 10*3/uL (ref 0.0–0.7)
Eosinophils Relative: 1 % (ref 0–5)
HCT: 40.6 % (ref 36.0–46.0)
Hemoglobin: 13.7 g/dL (ref 12.0–15.0)
Lymphocytes Relative: 29 % (ref 12–46)
Lymphs Abs: 1.3 10*3/uL (ref 0.7–4.0)
MCH: 28.8 pg (ref 26.0–34.0)
MCV: 85.5 fL (ref 78.0–100.0)
Monocytes Absolute: 0.5 10*3/uL (ref 0.1–1.0)
Monocytes Relative: 11 % (ref 3–12)
Platelets: 186 10*3/uL (ref 150–400)
RBC: 4.75 MIL/uL (ref 3.87–5.11)
WBC: 4.4 10*3/uL (ref 4.0–10.5)

## 2012-08-29 ENCOUNTER — Encounter: Payer: Self-pay | Admitting: Emergency Medicine

## 2012-09-05 NOTE — Telephone Encounter (Signed)
[name] , can you send the PT order to [facility]?

## 2012-09-06 ENCOUNTER — Other Ambulatory Visit: Payer: Self-pay | Admitting: Emergency Medicine

## 2012-09-09 ENCOUNTER — Other Ambulatory Visit: Payer: Self-pay | Admitting: Cardiovascular Disease

## 2012-09-26 ENCOUNTER — Other Ambulatory Visit: Payer: Self-pay | Admitting: Physician Assistant

## 2012-09-26 NOTE — Telephone Encounter (Signed)
Med encounter, sent in 90 day supply

## 2012-09-28 ENCOUNTER — Encounter: Payer: Self-pay | Admitting: Oncology

## 2012-09-28 ENCOUNTER — Telehealth: Payer: Self-pay | Admitting: Oncology

## 2012-09-28 NOTE — Telephone Encounter (Signed)
Sent letter to patient from Dr. Magrinat. °

## 2012-11-16 ENCOUNTER — Other Ambulatory Visit: Payer: Self-pay

## 2012-12-15 ENCOUNTER — Other Ambulatory Visit: Payer: Self-pay | Admitting: Physician Assistant

## 2012-12-19 ENCOUNTER — Other Ambulatory Visit: Payer: Self-pay | Admitting: Emergency Medicine

## 2013-03-20 ENCOUNTER — Other Ambulatory Visit: Payer: Self-pay | Admitting: Physician Assistant

## 2013-04-18 ENCOUNTER — Ambulatory Visit (INDEPENDENT_AMBULATORY_CARE_PROVIDER_SITE_OTHER): Payer: BC Managed Care – PPO | Admitting: Family Medicine

## 2013-04-18 VITALS — BP 132/82 | HR 90 | Temp 98.2°F | Resp 17 | Ht 67.5 in | Wt 319.0 lb

## 2013-04-18 DIAGNOSIS — E785 Hyperlipidemia, unspecified: Secondary | ICD-10-CM

## 2013-04-18 DIAGNOSIS — M25562 Pain in left knee: Secondary | ICD-10-CM

## 2013-04-18 LAB — POCT CBC
Granulocyte percent: 56.4 %G (ref 37–80)
HCT, POC: 43.5 % (ref 37.7–47.9)
Hemoglobin: 13.9 g/dL (ref 12.2–16.2)
Lymph, poc: 1.4 (ref 0.6–3.4)
MCH, POC: 29.6 pg (ref 27–31.2)
MCHC: 32 g/dL (ref 31.8–35.4)
MCV: 92.7 fL (ref 80–97)
MID (cbc): 0.4 (ref 0–0.9)
MPV: 8.9 fL (ref 0–99.8)
POC Granulocyte: 2.4 (ref 2–6.9)
POC LYMPH PERCENT: 33.6 %L (ref 10–50)
POC MID %: 10 %M (ref 0–12)
Platelet Count, POC: 219 10*3/uL (ref 142–424)
RBC: 4.69 M/uL (ref 4.04–5.48)
RDW, POC: 14.9 %
WBC: 4.3 10*3/uL — AB (ref 4.6–10.2)

## 2013-04-18 NOTE — Progress Notes (Signed)
Is a 63 year old retired woman with left knee pain for the last month or so. She knows her weight plays a role in this and she has been seeing Dr. Estanislado Pandy for intermittent injections in her joints and knee. More recently she has gone to the [facility] and got some shoe inserts which seem to be really helping. She also notes some stiffness in her fingers of the last several months  Patient denies fever or trauma.  She's recovered from ovarian cancer with subsequent abdominal hernia. She's also had pulmonary embolus and is wearing compression stockings, knee height, to prevent edema or recurrence of embolism  She's also here to have her Lipitor refilled. She's having no muscle aches, nausea, or other GI disturbances.  Objective: No acute distress Examination left knee reveals that patient has mild tenderness on the tibial side of the left joint line with no palpable effusion or bony abnormality. Chest: Clear Heart: Regular no abnormality Abdomen:  Very mild left sided tenderness with no rebound or guarding.  No HSM Extremities: Wearing knee-high stockings  Upon further questioning, patient says that she would like to get some nutrition counseling get her weight down. We talked about exercise in swimming pool as one option to help her joints.  Assessment: Hyperlipidemia, mild osteoarthritis, multiple past medical problems of significance, and morbid obesity.  Hyperlipidemia - Plan: Comprehensive metabolic panel, Lipid panel, POCT CBC  Knee pain, left  Morbid obesity - Plan: Amb ref to Medical Nutrition Therapy-MNT Patient will continue with a hinged knee brace that she has at home as well as acetaminophen 4 times a day.  Signed, Robyn Haber, MD

## 2013-04-19 LAB — COMPREHENSIVE METABOLIC PANEL
ALT: 28 U/L (ref 0–35)
AST: 25 U/L (ref 0–37)
Albumin: 4.6 g/dL (ref 3.5–5.2)
Alkaline Phosphatase: 93 U/L (ref 39–117)
BUN: 19 mg/dL (ref 6–23)
CO2: 28 mEq/L (ref 19–32)
Calcium: 10.2 mg/dL (ref 8.4–10.5)
Chloride: 101 mEq/L (ref 96–112)
Creat: 0.79 mg/dL (ref 0.50–1.10)
Glucose, Bld: 97 mg/dL (ref 70–99)
Potassium: 4.5 mEq/L (ref 3.5–5.3)
Sodium: 140 mEq/L (ref 135–145)
Total Bilirubin: 0.7 mg/dL (ref 0.2–1.2)
Total Protein: 7.1 g/dL (ref 6.0–8.3)

## 2013-04-19 LAB — LIPID PANEL
Cholesterol: 175 mg/dL (ref 0–200)
HDL: 40 mg/dL (ref >39)
LDL Cholesterol: 72 mg/dL (ref 0–99)
Total CHOL/HDL Ratio: 4.4 Ratio
Triglycerides: 316 mg/dL — ABNORMAL HIGH (ref <150)
VLDL: 63 mg/dL — ABNORMAL HIGH (ref 0–40)

## 2013-04-20 ENCOUNTER — Telehealth: Payer: Self-pay

## 2013-04-20 MED ORDER — ATORVASTATIN CALCIUM 40 MG PO TABS: 40.0000 mg | ORAL_TABLET | Freq: Every day | ORAL | Status: DC

## 2013-04-20 NOTE — Telephone Encounter (Signed)
Pt called to ask if her RX be sent to pharm for atorvastatin now that her labs came back normal. I am sending RFs for dose pt has been taking effectively.

## 2013-06-17 ENCOUNTER — Other Ambulatory Visit: Payer: Self-pay | Admitting: Physician Assistant

## 2013-06-22 ENCOUNTER — Encounter: Payer: Self-pay | Admitting: Obstetrics & Gynecology

## 2013-06-22 ENCOUNTER — Ambulatory Visit (INDEPENDENT_AMBULATORY_CARE_PROVIDER_SITE_OTHER): Payer: Medicare Other | Admitting: Obstetrics & Gynecology

## 2013-06-22 VITALS — BP 128/76 | HR 76 | Resp 18 | Ht 66.25 in | Wt 313.0 lb

## 2013-06-22 DIAGNOSIS — Z8543 Personal history of malignant neoplasm of ovary: Secondary | ICD-10-CM

## 2013-06-22 NOTE — Patient Instructions (Signed)

## 2013-06-22 NOTE — Progress Notes (Signed)
63 y.o. G0P0 SingleCaucasianF here for annual exam.  No vaginal bleeding.  Still having lots of walking issues.  Has done extensive testing without any real diagnosis.  Considering going to the [facility] in [place], [place].    Did BRCA testing in 5/14.  This was negative.  On xarelto last year.  Very pleased with this drug.    No LMP recorded. Patient is postmenopausal.          Sexually active: no  The current method of family planning is status post hysterectomy.    Exercising: no  Physical Therapy Smoker:  no  Health Maintenance: Pap:  2005, h/o TAH/BSO History of abnormal Pap:  no MMG:  07/2012 BIRADS2 Colonoscopy:  2011 BMD:   07/2012  TDaP:  2003.  Aware this is due.   Screening Labs: PCP, Hb today: PCP, Urine today: PCP   reports that she has never smoked. She has never used smokeless tobacco. She reports that she does not drink alcohol or use illicit drugs.  Past Medical History  Diagnosis Date  . HTN (hypertension)   . Hyperlipidemia   . Femoral DVT (deep venous thrombosis) 2008    left  . OSA (obstructive sleep apnea)     hypoventilation, on CPAP  . DVT (deep vein thrombosis) in pregnancy     LEFT LEG  . Bronchitis   . 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 Basal cell carcinoma of nose   . Shingles     Past Surgical History  Procedure Laterality Date  . Total abdominal hysterectomy w/ bilateral salpingoophorectomy  2005    Stage IA ovarian cancer  . Hernia repair  2008  . Basal cell carcinoma excision      resection with plastic surgery reconstrustion  . Cataract surgery Bilateral [id number], 2005    with bilateral lens replacements  . Bilateral breast mass  1970    benigned, left breast    Current Outpatient Prescriptions  Medication Sig Dispense Refill  . acetaminophen (TYLENOL) 325 MG tablet Take 650 mg by mouth every 6 (six) hours as needed.      Marland Kitchen atorvastatin (LIPITOR) 40 MG tablet Take 1 tablet (40 mg total) by mouth daily.  90 tablet   1  . calcium carbonate (OS-CAL) 600 MG TABS Take 600 mg by mouth 2 (two) times daily with a meal.        . cetirizine (ZYRTEC) 10 MG tablet Take 10 mg by mouth daily.      . ferrous sulfate 325 (65 FE) MG tablet Take 325 mg by mouth daily with breakfast.        . fish oil-omega-3 fatty acids 1000 MG capsule Take 2 g by mouth as needed.        Marland Kitchen lisinopril-hydrochlorothiazide (PRINZIDE,ZESTORETIC) 20-25 MG per tablet Take 1 tablet by mouth daily. PATIENT NEEDS AN OFFICE VISIT FOR ADDITIONAL REFILLS. 2nd NOTICE  30 tablet  0  . Multiple Vitamin (MULTIVITAMIN) tablet Take 1 tablet by mouth daily.        . niacin 500 MG tablet Take 500 mg by mouth daily with breakfast.        . vitamin E 400 UNIT capsule Take 400 Units by mouth daily.        [name] 20 MG TABS tablet TAKE 1 TABLET EVERY DAY  90 tablet  3   No current facility-administered medications for this visit.    Family History  Problem Relation Age  of Onset  . Alzheimer's disease Mother   . Hypertension Mother   . Alzheimer's disease Father   . Hypertension Father   . Colon cancer Father     2  . Hypertension Sister   . Hypertension Brother     ROS:  Pertinent items are noted in HPI.  Otherwise, a comprehensive ROS was negative.  Exam:   BP 128/76  Pulse 76  Resp 18  Ht 5' 6.25" (1.683 m)  Wt 313 lb (141.976 kg)  BMI 50.12 kg/m2  Weight change: +2#   Height: 5' 6.25" (168.3 cm)  Ht Readings from Last 3 Encounters:  06/22/13 5' 6.25" (1.683 m)  04/18/13 5' 7.5" (1.715 m)  08/15/12 '5\' 7"'  (1.702 m)    General appearance: alert, cooperative and appears stated age Head: Normocephalic, without obvious abnormality, atraumatic Neck: no adenopathy, supple, symmetrical, trachea midline and thyroid normal to inspection and palpation Lungs: clear to auscultation bilaterally Breasts: normal appearance, no masses or tenderness, area in left breast where prior injury was present--small 3 mm tender area noted.  Heart: regular  rate and rhythm Abdomen: soft, non-tender; bowel sounds normal; no masses,  no organomegaly Extremities: extremities normal, atraumatic, no cyanosis or edema Skin: Skin color, texture, turgor normal. No rashes or lesions Lymph nodes: Cervical, supraclavicular, and axillary nodes normal. No abnormal inguinal nodes palpated Neurologic: Grossly normal   Pelvic: External genitalia:  no lesions              Urethra:  normal appearing urethra with no masses, tenderness or lesions              Bartholins and Skenes: normal                 Vagina: normal appearing vagina with normal color and discharge, no lesions              Cervix: absent              Pap taken: no Bimanual Exam:  Uterus:  uterus absent              Adnexa: no mass, fullness, tenderness               Rectovaginal: Confirms               Anus:  normal sphincter tone, no lesions  A:  Well Woman with normal exam  H/O Ovarian cancer, 1A. S/p TAH/BSO.  H/O large left LE DVT. On chronic anticoagulation, now with Xarelto Obesity  Family hx of breast cancer (niece).  Skin candida  P:   Mammogram yearly. Doing 3D MMG for pt.  H/O fall with trauma to left breast. No pap smear. H/O TAH/BSO after treatment for ovarian cancer  Ca-125 yearly. Have checked with Dr. Fermin Schwab regarding this.  Nystatin cream and power rxs not needed.  Pt will call if she needs them. Knows tetenus is due. Declines but will do if has a risk.  return annually or prn  An After Visit Summary was printed and given to the patient.

## 2013-06-23 LAB — CA 125: CA 125: 6.7 U/mL (ref 0.0–30.2)

## 2013-07-02 ENCOUNTER — Telehealth: Payer: Self-pay | Admitting: *Deleted

## 2013-07-02 NOTE — Telephone Encounter (Signed)
[facility] approved xarelto through 07/03/2014 Pharmacy notified

## 2013-07-02 NOTE — Telephone Encounter (Signed)
PA for xarelto to [facility]

## 2013-07-04 ENCOUNTER — Encounter: Payer: Self-pay | Admitting: Neurology

## 2013-07-04 ENCOUNTER — Ambulatory Visit (INDEPENDENT_AMBULATORY_CARE_PROVIDER_SITE_OTHER): Payer: Medicare Other | Admitting: Neurology

## 2013-07-04 VITALS — BP 129/81 | HR 85 | Resp 16 | Ht 67.5 in | Wt 319.0 lb

## 2013-07-04 DIAGNOSIS — Z9989 Dependence on other enabling machines and devices: Principal | ICD-10-CM

## 2013-07-04 DIAGNOSIS — G4733 Obstructive sleep apnea (adult) (pediatric): Secondary | ICD-10-CM | POA: Insufficient documentation

## 2013-07-04 NOTE — Progress Notes (Signed)
[facility]  Provider:  Dr Chrishun Scheer Referring Provider: Darlyne Russian, MD Primary Care Physician:  [name], MD  Chief Complaint  Patient presents with  . Follow-up    Room 11  . Sleep Apnea    HPI:  [name] is a 63 y.o. female here as a referral from Dr. Everlene Farrier for her yearly follow up visit, the patent has OSA and is on CPAP therapy. Review of Systems: Out of a complete 14 system review, the patient complains of only the following symptoms, and all other reviewed systems are negative.  Her FSS is 11 points and Epworth is 1 points, good results. No nocturia , dry nose in AM.  Her CPAP download: 07-03-13; 7 hours 17 minutes nightly use over the 90 days download period. 98% compliance . AHI 0.8,  8 cm water pressure setting and 2 cm EPR. Very good result. BMI remains elevated , her main risk factor is her obesity.       Last years visit note : 2014 . [name] ,a 63 year old, [name], right-handed female patient of Dr.Daub's is followed here for a sleep apnea.  The patient underwent a split study on 9-21 2012, after complaining of excessive daytime fatigue, sleepiness, witnessed apneas and fragmented sleep at night she also was easily short of breath. The patient at the past medical history of morbid obesity, hypertension and hyperlipidemia. At the time of her sleep study her BMI was 46.7 and neck circumference 17 inches her blood pressure was also slightly elevated 141/81 mm Hg. The patient had been on Coumadin for DVT at the time.  She tested positive for an AHI of 120 and had to be immediately titrated to a pressure of 9 cm water was reduced for AHI to 0.0 he also was able to enter REM sleep. There was borderline hypoxia noted with hypoventilation and periodic limb movements were also very frequent at night. In February bry 2013 she followed up  90 days download- and was using CPAP nightly , had not gained weight and a download revealed a residual  AHI on CPAP of 8 cm water of 1.6 . She has meanwhile tried to further was weight especially since he also has had pain and pain due to arthritis. A download from 4-17 2013 showed an AHI of 1.1, average daily usage of 6 hours 56 minutes set pressure of 8 cm water with a 2 cm EPR. Today her Epworth sleepiness score is 4 points her fatigue score 25 and the patient has continued to use compliantly her CPAP. We were unable to obtain a download in the office today. The patient reports her normal sleep time is from 12:30 midnight to 7:30 AM, she normally falls asleep within 5 minutes.  She has no longer nocturia but had nocturia before starting CPAP.  She averages about 7 hours of sleep at night. When she wakes up she feels restored, she still takes one nap daily but after lunch about the duration of 30-45 minutes. She reports laughingly that her whole family naps ! The nap is perceived as refreshing. Does not recall any dreams or nightmares or any dreams activity sleepwalking or sleep talking. She does not snore through the machine. She retired  since last visit and is free to nap now. She is losing weight, about 6 pounds a month.       History   Social History  . Marital Status: Single    Spouse Name: N/A    Number  of Children: 0  . Years of Education: masters   Occupational History  . 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 Smoking status: Former Research scientist (life sciences)  . Smokeless tobacco: Never Used  . Alcohol Use: Yes     Comment: rare  . Drug Use: No  . Sexual Activity: No   Other Topics Concern  . 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 PSH showed an AHI of 120!!!! titrated to 8 cm watr cPAP , residual AHI of 1 and downlaod was done  01-23-11 .  excellent compliance - 7 hours and low leak,  nasal pillow mask.    BMI is considered morbidly obese.  discussed low carb  diet - weight watchers and  restricted exercise. , aquatic .    Patient cannot exercise due to soreness in proximal muscles.and  would like to add coenzyme q 10 and carnitine . She needs a medical weight loss regimen - bariatric surgery?       FSS 48, Epworth 4 again ,  CMS compliant  residual AHI 1.1 and average use 6.48  hours , no naps    Patient is single and lives alone.   Patient does not have any children.   Patient is retired.   Patient has a Master's degree.   Patient is left-handed.   Patient drinks tea occasionally.              Family History  Problem Relation Age of Onset  . Alzheimer's disease Mother   . Hypertension Mother   . Alzheimer's disease Father   . Hypertension Father   . Colon cancer Father     43  . Hypertension Sister   . Hypertension Brother     Past Medical History  Diagnosis Date  . HTN (hypertension)   . Hyperlipidemia   . Femoral DVT (deep venous thrombosis) 2008    left  . OSA (obstructive sleep apnea)     hypoventilation, on CPAP  . DVT (deep vein thrombosis) in pregnancy     LEFT LEG  . Bronchitis   . 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 Basal cell carcinoma of nose   . Shingles     Past Surgical History  Procedure Laterality Date  . Total abdominal hysterectomy w/ bilateral salpingoophorectomy  2005    Stage IA ovarian cancer  . Hernia repair  2008  . Basal cell carcinoma excision      resection with plastic surgery reconstrustion  . Cataract surgery Bilateral [id number], 2005    with bilateral lens replacements  . Bilateral breast mass  1970    benigned, left breast    Current Outpatient Prescriptions  Medication Sig Dispense Refill  . acetaminophen (TYLENOL) 325 MG tablet Take 650 mg by mouth every 6 (six) hours as needed.      Marland Kitchen atorvastatin (LIPITOR) 40 MG tablet Take 1 tablet (40 mg total) by mouth daily.  90 tablet  1  . calcium carbonate (OS-CAL) 600 MG TABS Take 600 mg by mouth 2 (two) times daily with a meal.        .  cetirizine (ZYRTEC) 10 MG tablet Take 10 mg by mouth daily.      . ferrous sulfate 325 (65 FE) MG tablet Take 325 mg by mouth daily with breakfast.        .  fish oil-omega-3 fatty acids 1000 MG capsule Take 2 g by mouth as needed.        Marland Kitchen lisinopril-hydrochlorothiazide (PRINZIDE,ZESTORETIC) 20-25 MG per tablet Take 1 tablet by mouth daily. PATIENT NEEDS AN OFFICE VISIT FOR ADDITIONAL REFILLS. 2nd NOTICE  30 tablet  0  . Multiple Vitamin (MULTIVITAMIN) tablet Take 1 tablet by mouth daily.        . niacin 500 MG tablet Take 500 mg by mouth daily with breakfast.        . vitamin E 400 UNIT capsule Take 400 Units by mouth daily.        [name] 20 MG TABS tablet TAKE 1 TABLET EVERY DAY  90 tablet  3   No current facility-administered medications for this visit.    Allergies as of 07/04/2013 - Review Complete 07/04/2013  Allergen Reaction Noted  . Valtrex [valacyclovir hcl] Swelling and Rash 10/20/2011  . Benoxinate base  12/04/2010  . Sulfonamide derivatives  07/18/2008  . Ivp dye [iodinated diagnostic agents] Rash 12/04/2010    Vitals: BP 129/81  Pulse 85  Resp 16  Ht 5' 7.5" (1.715 m)  Wt 319 lb (144.697 kg)  BMI 49.20 kg/m2 Last Weight:  Wt Readings from Last 1 Encounters:  07/04/13 319 lb (144.697 kg)   Last Height:   Ht Readings from Last 1 Encounters:  07/04/13 5' 7.5" (1.715 m)     Physical exam:  General: The patient is awake, alert and appears not in acute distress. The patient is well groomed. Head: Normocephalic, atraumatic. Neck is supple. Mallampati 4 ,  neck circumference: 18 inches  - no retrognathia.  No TMJ .Short, thick neck  Cardiovascular:  Regular rate and rhythm, without  murmurs or carotid bruit, and without distended neck veins. Respiratory: Lungs are clear to auscultation. Patient has restricted deep breathing.  Skin: ankle  Edema, this patient has unusual finger nail deformities- spooned , dimpled.  Trunk: BMI is morbidly obese , has normal  posture.  Neurologic exam : The patient is awake and alert, oriented to place and time.  Memory subjective  described as intact. There is a normal attention span & concentration ability.  Speech is fluent without dysarthria, dysphonia or aphasia. Mood and affect are appropriate.  Cranial nerves: Pupils are equal and briskly reactive to light. Funduscopic exam without  evidence of pallor or edema. Extraocular movements  in vertical and horizontal planes intact and without nystagmus. Visual fields by finger perimetry are intact. Hearing to finger rub intact.  Facial sensation intact to fine touch. Facial motor strength is symmetric and tongue and uvula move midline.  Motor exam:   Normal tone and normal muscle bulk and symmetric normal strength in all extremities. Crepitation over knees , pain with ROM in her hips.   Sensory:  Fine touch, pinprick and vibration were tested in all extremities. Coordination: Rapid alternating movements in the fingers/hands is  normal. without evidence of ataxia, dysmetria or tremor.  Gait and station: Patient walks without assistive device . Wide based.   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  Severe AHI of 120 in the patient baseline study completely corrected of only 8 cm of water pressure and no additional oxygen was prescribed the residual AHI was o.8 the average daily usage 7 hours and 17  minutes in 2015 Continue using at the current pressure.  #2 r body mass index. I suggested to the  patient to look into the 2 and 5 day diet, although known as  intermittent fast.  The patient would be restricted to 500 kcal for nonconsecutive days a week, and has no calorie restrictions on the other days. Is encouraged to hydrate well, and to keep her vitamin K intake in mind , as she continues to be on warfarin.

## 2013-07-04 NOTE — Patient Instructions (Signed)
Obesity Obesity is defined as having too much total body fat and a body mass index (BMI) of 30 or more. BMI is an estimate of body fat and is calculated from your height and weight. Obesity happens when you consume more calories than you can burn by exercising or performing daily physical tasks. Prolonged obesity can cause major illnesses or emergencies, such as:   A stroke.  Heart disease.  Diabetes.  Cancer.  Arthritis.  High blood pressure (hypertension).  High cholesterol.  Sleep apnea.  Erectile dysfunction.  Infertility problems. CAUSES   Regularly eating unhealthy foods.  Physical inactivity.  Certain disorders, such as an underactive thyroid (hypothyroidism), Cushing's syndrome, and polycystic ovarian syndrome.  Certain medicines, such as steroids, some depression medicines, and antipsychotics.  Genetics.  Lack of sleep. DIAGNOSIS  A caregiver can diagnose obesity after calculating your BMI. Obesity will be diagnosed if your BMI is 30 or higher.  There are other methods of measuring obesity levels. Some other methods include measuring your skin fold thickness, your waist circumference, and comparing your hip circumference to your waist circumference. TREATMENT  A healthy treatment program includes some or all of the following:  Long-term dietary changes.  Exercise and physical activity.  Behavioral and lifestyle changes.  Medicine only under the supervision of your caregiver. Medicines may help, but only if they are used with diet and exercise programs. An unhealthy treatment program includes:  Fasting.  Fad diets.  Supplements and drugs. These choices do not succeed in long-term weight control.  HOME CARE INSTRUCTIONS   Exercise and perform physical activity as directed by your caregiver. To increase physical activity, try the following:  Use stairs instead of elevators.  Park farther away from store entrances.  Garden, bike, or walk instead of  watching television or using the computer.  Eat healthy, low-calorie foods and drinks on a regular basis. Eat more fruits and vegetables. Use low-calorie cookbooks or take healthy cooking classes.  Limit fast food, sweets, and processed snack foods.  Eat smaller portions.  Keep a daily journal of everything you eat. There are many free websites to help you with this. It may be helpful to measure your foods so you can determine if you are eating the correct portion sizes.  Avoid drinking alcohol. Drink more water and drinks without calories.  Take vitamins and supplements only as recommended by your caregiver.  Weight-loss support groups, Nurse, mental health, counselors, and stress reduction education can also be very helpful. SEEK IMMEDIATE MEDICAL CARE IF:  You have chest pain or tightness.  You have trouble breathing or feel short of breath.  You have weakness or leg numbness.  You feel confused or have trouble talking.  You have sudden changes in your vision. MAKE SURE YOU:  Understand these instructions.  Will watch your condition.  Will get help right away if you are not doing well or get worse. Document Released: 02/05/2004 Document Revised: 06/29/2011 Document Reviewed: 02/03/2011 [facility] Patient Information 2015 [place], [place]. This information is not intended to replace advice given to you by your health care provider. Make sure you discuss any questions you have with your health care provider.

## 2013-07-15 ENCOUNTER — Other Ambulatory Visit: Payer: Self-pay | Admitting: Physician Assistant

## 2013-07-15 ENCOUNTER — Other Ambulatory Visit: Payer: Self-pay | Admitting: Family Medicine

## 2013-07-16 NOTE — Telephone Encounter (Signed)
Dr Everlene Farrier, pt saw Dr Carlean Jews in April, but not for HTN. She has appt w/you on 09/18/13. Do you want to RF until then or RTC at 102 for RF?

## 2013-07-25 ENCOUNTER — Other Ambulatory Visit: Payer: Self-pay

## 2013-07-25 MED ORDER — RIVAROXABAN 20 MG PO TABS: ORAL_TABLET | ORAL | Status: DC

## 2013-07-31 ENCOUNTER — Telehealth: Payer: Self-pay

## 2013-07-31 NOTE — Telephone Encounter (Signed)
We received a fax from [facility] (which will be scanned) regarding this pt's most recent mammogram. Per Dr. Everlene Farrier, call pt and make sure that she has a follow up fil done. Called pt. She had it done this morning. They told her that there was increase calcification around area of trauma and to follow up in 6 mos.

## 2013-08-24 ENCOUNTER — Ambulatory Visit: Payer: BC Managed Care – PPO | Admitting: Cardiovascular Disease

## 2013-09-18 ENCOUNTER — Ambulatory Visit (INDEPENDENT_AMBULATORY_CARE_PROVIDER_SITE_OTHER): Payer: Medicare Other | Admitting: Emergency Medicine

## 2013-09-18 ENCOUNTER — Encounter: Payer: Self-pay | Admitting: Emergency Medicine

## 2013-09-18 DIAGNOSIS — Z23 Encounter for immunization: Secondary | ICD-10-CM

## 2013-09-18 DIAGNOSIS — Z0000 Encounter for general adult medical examination without abnormal findings: Secondary | ICD-10-CM

## 2013-09-18 DIAGNOSIS — I10 Essential (primary) hypertension: Secondary | ICD-10-CM

## 2013-09-18 LAB — POCT URINALYSIS DIPSTICK
Bilirubin, UA: NEGATIVE
Glucose, UA: NEGATIVE
KETONES UA: NEGATIVE
LEUKOCYTES UA: NEGATIVE
Nitrite, UA: NEGATIVE
PROTEIN UA: NEGATIVE
Spec Grav, UA: 1.01
Urobilinogen, UA: 0.2
pH, UA: 5

## 2013-09-18 LAB — COMPLETE METABOLIC PANEL WITH GFR
ALK PHOS: 82 U/L (ref 39–117)
ALT: 28 U/L (ref 0–35)
AST: 24 U/L (ref 0–37)
Albumin: 4.5 g/dL (ref 3.5–5.2)
BILIRUBIN TOTAL: 0.8 mg/dL (ref 0.2–1.2)
BUN: 18 mg/dL (ref 6–23)
CO2: 29 mEq/L (ref 19–32)
Calcium: 10.2 mg/dL (ref 8.4–10.5)
Chloride: 102 mEq/L (ref 96–112)
Creat: 0.9 mg/dL (ref 0.50–1.10)
GFR, EST NON AFRICAN AMERICAN: 67 mL/min
GFR, Est African American: 78 mL/min
Glucose, Bld: 91 mg/dL (ref 70–99)
Potassium: 4.4 mEq/L (ref 3.5–5.3)
SODIUM: 141 meq/L (ref 135–145)
Total Protein: 7 g/dL (ref 6.0–8.3)

## 2013-09-18 LAB — CBC WITH DIFFERENTIAL/PLATELET
BASOS ABS: 0 10*3/uL (ref 0.0–0.1)
BASOS PCT: 0 % (ref 0–1)
EOS ABS: 0 10*3/uL (ref 0.0–0.7)
Eosinophils Relative: 1 % (ref 0–5)
HEMATOCRIT: 40.5 % (ref 36.0–46.0)
HEMOGLOBIN: 13.8 g/dL (ref 12.0–15.0)
LYMPHS ABS: 1.4 10*3/uL (ref 0.7–4.0)
Lymphocytes Relative: 31 % (ref 12–46)
MCH: 29.7 pg (ref 26.0–34.0)
MCHC: 34.1 g/dL (ref 30.0–36.0)
MCV: 87.1 fL (ref 78.0–100.0)
Monocytes Absolute: 0.5 10*3/uL (ref 0.1–1.0)
Monocytes Relative: 11 % (ref 3–12)
NEUTROS ABS: 2.6 10*3/uL (ref 1.7–7.7)
Neutrophils Relative %: 57 % (ref 43–77)
Platelets: 190 10*3/uL (ref 150–400)
RBC: 4.65 MIL/uL (ref 3.87–5.11)
RDW: 14.7 % (ref 11.5–15.5)
WBC: 4.6 10*3/uL (ref 4.0–10.5)

## 2013-09-18 LAB — LIPID PANEL
CHOL/HDL RATIO: 3.2 ratio
Cholesterol: 168 mg/dL (ref 0–200)
HDL: 52 mg/dL (ref >39)
LDL CALC: 82 mg/dL (ref 0–99)
Triglycerides: 170 mg/dL — ABNORMAL HIGH (ref <150)
VLDL: 34 mg/dL (ref 0–40)

## 2013-09-18 NOTE — Progress Notes (Signed)
Subjective:  This chart was scribed for Kathleen Queen, MD by [name], Medical Scribe. This patient was seen in Room 22 and the patient's care was started at 1:56 PM.   Patient ID: [name], female    DOB: 06/15/1950, 63 y.o.   MRN: [id number]  HPI HPI Comments: [name] is a 63 y.o. female who presents to the [facility] for an annual exam.  She had stage 1 ovarian cancer 10 years ago but is now in remission.  She was switched from coumadin to xarelto by Dr. Johnsie [name] following a DVT.  She is still not interested in bariatric surgery.  Her OSA is followed by Dr. Brett [name].    She has another appointment with Dr. Johnsie [name] in a couple of weeks.  She went to OB/GYN recently to recheck the status of her ovarian cancer.  She had a normal exam and also had genetic testing done which revealed that she is not a carrier.  She went to the ophthalmologist today.  She has to go back for a repeat mammogram in 6 months.  She has received a shingles vaccine in the past.  She has a history of shingles.    She is complaining of constant headaches that started 2-3 months ago that she believes may be attributed to sinusitis or allergies.  She lists postnasal drip as an associated symptom and blows her nose frequently.  She has taken generic cetrizine with no relief to her symptoms.  She has not taken any allergy nasal sprays for her symptoms.    She is also complaining of fragile fingernails that she attributes to past chemotherapy treatments.  She takes calcium daily with no relief to her symptoms.  She is going to see Dr. Tonia [name] for further evaluation.  Past Medical History  Diagnosis Date  . HTN (hypertension)   . Hyperlipidemia   . Femoral DVT (deep venous thrombosis) 2008    left  . OSA (obstructive sleep apnea)     hypoventilation, on CPAP  . DVT (deep vein thrombosis) in pregnancy     LEFT LEG  . Bronchitis   . Cancer of ovary 2005    Stage 1A, BRCA 1/2 neg 6/14    . Basal cell carcinoma of nose   . Shingles    Past Surgical History  Procedure Laterality Date  . Total abdominal hysterectomy w/ bilateral salpingoophorectomy  2005    Stage IA ovarian cancer  . Hernia repair  2008  . Basal cell carcinoma excision      resection with plastic surgery reconstrustion  . Cataract surgery Bilateral [id number], 2005    with bilateral lens replacements  . Bilateral breast mass  1970    benigned, left breast   Family History  Problem Relation Age of Onset  . Alzheimer's disease Mother   . Hypertension Mother   . Alzheimer's disease Father   . Hypertension Father   . Colon cancer Father     38  . Hypertension Sister   . Hypertension Brother    History   Social History  . Marital Status: Single    Spouse Name: N/A    Number of Children: 0  . Years of Education: masters   Occupational History  . 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 Smoking status: Former Research scientist (life sciences)  . Smokeless tobacco: Never Used  . Alcohol Use: Yes     Comment: rare  .  Drug Use: No  . Sexual Activity: No   Other Topics Concern  . 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 PSH showed an AHI of 120!!!! titrated to 8 cm watr cPAP , residual AHI of 1 and downlaod was done  01-23-11 .  excellent compliance - 7 hours and low leak,  nasal pillow mask.    BMI is considered morbidly obese.  discussed low carb  diet - weight watchers and restricted exercise. , aquatic .    Patient cannot exercise due to soreness in proximal muscles.and  would like to add coenzyme q 10 and carnitine . She needs a medical weight loss regimen - bariatric surgery?       FSS 48, Epworth 4 again ,  CMS compliant  residual AHI 1.1 and average use 6.48  hours , no naps    Patient is single and lives alone.   Patient does not have any children.   Patient is retired.   Patient has a Master's degree.    Patient is left-handed.   Patient drinks tea occasionally.             Allergies  Allergen Reactions  . Valtrex [Valacyclovir Hcl] Swelling and Rash    Swelling of face and arm, rash  . Benoxinate Base   . Sulfonamide Derivatives   . Ivp Dye [Iodinated Diagnostic Agents] Rash    Rash on back    Review of Systems  Neurological: Positive for headaches.  All other systems reviewed and are negative.    Objective:  Physical Exam  Nursing note and vitals reviewed. Constitutional: She is oriented to person, place, and time. She appears well-developed and well-nourished.  HENT:  Head: Normocephalic and atraumatic.  Right Ear: External ear normal.  Left Ear: External ear normal.  Nose: Nose normal.  Mouth/Throat: Oropharynx is clear and moist. No oropharyngeal exudate.  Eyes: Conjunctivae and EOM are normal. Pupils are equal, round, and reactive to light.  Neck: Normal range of motion. Neck supple. Carotid bruit is not present. No thyromegaly present.  Cardiovascular: Normal rate, regular rhythm and normal heart sounds.  Exam reveals no gallop and no friction rub.   No murmur heard. Pulmonary/Chest: Effort normal and breath sounds normal. No respiratory distress. She has no wheezes. She has no rales.  Abdominal: Soft. Bowel sounds are normal. There is no tenderness.  Musculoskeletal: Normal range of motion. She exhibits no edema.  Lymphadenopathy:    She has no cervical adenopathy.  Neurological: She is alert and oriented to person, place, and time. She has normal reflexes.  Skin: Skin is warm and dry. No rash noted.  Scar on the lower abdomen from previous ovarian surgery.  Compression hose on her leg lower leg.    Psychiatric: She has a normal mood and affect. Her behavior is normal.  Nails patient has linear grooves in a vertical direction involving all of her nails. She has a very prominent nail growth plate involving all of her nails   BP 120/60  Pulse 76  Temp(Src) 98.2  F (36.8 C) (Oral)  Resp 16  Ht 5' 6.5" (1.689 m)  Wt 303 lb (137.44 kg)  BMI 48.18 kg/m2  SpO2 98% Assessment & Plan:  I personally performed the services described in this documentation, which was scribed in my presence. The recorded information has been reviewed and is accurate. The patient is very concerned about her nails. I told  her I felt her nail changes were secondary to her previous chemotherapy with probable injury to the germinal center for the nails. Will get an opinion from the dermatologist regarding this. She is a patient of Dr. Marisue Ivan. Routine labs were done .Marland Kitchen

## 2013-10-08 ENCOUNTER — Ambulatory Visit (INDEPENDENT_AMBULATORY_CARE_PROVIDER_SITE_OTHER): Payer: Medicare Other | Admitting: Cardiovascular Disease

## 2013-10-08 ENCOUNTER — Encounter: Payer: Self-pay | Admitting: Cardiovascular Disease

## 2013-10-08 VITALS — BP 138/74 | HR 82 | Ht 66.5 in

## 2013-10-08 DIAGNOSIS — I82409 Acute embolism and thrombosis of unspecified deep veins of unspecified lower extremity: Secondary | ICD-10-CM

## 2013-10-08 DIAGNOSIS — R9431 Abnormal electrocardiogram [ECG] [EKG]: Secondary | ICD-10-CM

## 2013-10-08 DIAGNOSIS — I10 Essential (primary) hypertension: Secondary | ICD-10-CM

## 2013-10-08 MED ORDER — ATORVASTATIN CALCIUM 40 MG PO TABS: 40.0000 mg | ORAL_TABLET | Freq: Every day | ORAL | Status: DC

## 2013-10-08 MED ORDER — LISINOPRIL-HYDROCHLOROTHIAZIDE 20-25 MG PO TABS: 1.0000 | ORAL_TABLET | Freq: Every day | ORAL | Status: DC

## 2013-10-08 MED ORDER — RIVAROXABAN 20 MG PO TABS: ORAL_TABLET | ORAL | Status: DC

## 2013-10-08 NOTE — Assessment & Plan Note (Signed)
Well controlled.  Continue current medications and low sodium Dash type diet.    

## 2013-10-08 NOTE — Patient Instructions (Signed)
Your physician wants you to follow-up in:   Pisek will receive a reminder letter in the mail two months in advance. If you don't receive a letter, please call our office to schedule the follow-up appointment. Your physician recommends that you continue on your current medications as directed. Please refer to the Current Medication list given to you today.   NEED  TO HAVE PRIMARY  MEDICAL  DOCTOR  TO  CHECK  CBC WITH  PLATELETS  AND  BMET     2  TIMES  A YEAR

## 2013-10-08 NOTE — Assessment & Plan Note (Signed)
Nonspecific ST/T wave changes no change from 2014 stable

## 2013-10-08 NOTE — Assessment & Plan Note (Signed)
Have discussed in past she is not interested continue low fat/carb diet

## 2013-10-08 NOTE — Progress Notes (Signed)
Patient ID: [name], female   DOB: August 10, 1950, 63 y.o.   MRN: [id number] [name] is seen today for F/U of post-phlebitic syndrome, coumadin Rx and abnormal ECG. She is an ovarian cancer survivor out 5 years now. Her initial extensive DVT was in the setting of ovarian CA. She has had multiple F/U duplex showing persistan clot and recanalizatoin. With her weight we have elected to keep her on coumadin. She has had stable Rx doses and I think it would be ok to let her INR be checked every 6-8 weeks. We discussed Pradaxa but I don't like the idea that it is not reversable. With her meds and compressoin hose her edema is stable. She has had an abnromal ECG in the past with poor R wave progression but normal EF and I suspect it is from lead placement and body habitus No change on ECG today  Long discussion about bariatric surgery. She has had and open hysterectomy and mesh repair of hernia but would benefit from such surgery. She has now been Dx with servere sleep apnea and on CPAP Not interested in surgery Had injection of right knee with Deveshwar and improved but will eventually need bilateral knee replacements   Changed to xarelto for last visit   Needs Hb/Hct PLT and BMET checked twice/ year by Dr Everlene Farrier  09/18/13  Hct 40.5  PLT 190 Cr .9    ROS: Denies fever, malais, weight loss, blurry vision, decreased visual acuity, cough, sputum, SOB, hemoptysis, pleuritic pain, palpitaitons, heartburn, abdominal pain, melena, lower extremity edema, claudication, or rash.  All other systems reviewed and negative  General: Affect appropriate Obese white female  HEENT: normal Neck supple with no adenopathy JVP normal no bruits no thyromegaly Lungs clear with no wheezing and good diaphragmatic motion Heart:  S1/S2 no murmur, no rub, gallop or click PMI normal Abdomen: benighn, BS positve, no tenderness, no AAA no bruit.  No HSM or HJR Distal pulses intact with no bruits Plus one bilateral edema Neuro  non-focal Skin warm and dry No muscular weakness   Current Outpatient Prescriptions  Medication Sig Dispense Refill  . atorvastatin (LIPITOR) 40 MG tablet Take 1 tablet (40 mg total) by mouth daily.  90 tablet  1  . calcium carbonate (OS-CAL) 600 MG TABS Take 600 mg by mouth 2 (two) times daily with a meal.        . cetirizine (ZYRTEC) 10 MG tablet Take 10 mg by mouth daily.      . ferrous sulfate 325 (65 FE) MG tablet Take 325 mg by mouth daily with breakfast.        . fish oil-omega-3 fatty acids 1000 MG capsule Take 2 g by mouth as needed.        Marland Kitchen lisinopril-hydrochlorothiazide (PRINZIDE,ZESTORETIC) 20-25 MG per tablet Take 1 tablet by mouth daily. PATIENT NEEDS AN OFFICE VISIT FOR ADDITIONAL REFILLS. 2nd NOTICE  30 tablet  0  . Multiple Vitamin (MULTIVITAMIN) tablet Take 1 tablet by mouth daily.        . niacin 500 MG tablet Take 500 mg by mouth daily with breakfast.        . rivaroxaban (XARELTO) 20 MG TABS tablet TAKE 1 TABLET EVERY DAY  90 tablet  3  . vitamin E 400 UNIT capsule Take 400 Units by mouth daily.         No current facility-administered medications for this visit.    Allergies  Valtrex; Benoxinate base; Sulfonamide derivatives; and Ivp dye  Electrocardiogram:  SR rate 82  Nonspecific ST changes   Assessment and Plan

## 2013-10-08 NOTE — Assessment & Plan Note (Signed)
Chronic no pain edema stable  Continue xarelto  Labs ok

## 2013-10-29 ENCOUNTER — Other Ambulatory Visit: Payer: Self-pay | Admitting: Dermatology

## 2014-04-16 ENCOUNTER — Other Ambulatory Visit: Payer: Self-pay | Admitting: Gastroenterology

## 2014-05-02 ENCOUNTER — Telehealth: Payer: Self-pay

## 2014-05-02 NOTE — Telephone Encounter (Signed)
Called patient and left her a message to call the office back so ,we can rescheduled with Dr.Dohmeier . Patient will need a 15 minute apt. OSA CPAP.  Please remind patient to bring her CPAP machine. Thanks Hinton Dyer.

## 2014-05-09 ENCOUNTER — Telehealth: Payer: Self-pay | Admitting: Cardiovascular Disease

## 2014-05-09 NOTE — Telephone Encounter (Signed)
NOTIFIED ASHLEY  TO HAVE PT   HOLD  XARELTO  48 HOURS  PRIOR TO COLONOSCOPY./CY

## 2014-05-09 NOTE — Telephone Encounter (Signed)
New message         Pt needs instructions on how to stop xarelto prior to having a colonoscopy on May 10th

## 2014-05-15 ENCOUNTER — Encounter (HOSPITAL_COMMUNITY): Payer: Self-pay | Admitting: *Deleted

## 2014-05-20 ENCOUNTER — Encounter (HOSPITAL_COMMUNITY): Payer: Self-pay | Admitting: Anesthesiology

## 2014-05-20 NOTE — Anesthesia Preprocedure Evaluation (Addendum)
Anesthesia Evaluation  Patient identified by MRN, date of birth, ID band Patient awake    Reviewed: Allergy & Precautions, NPO status , Patient's Chart, lab work & pertinent test results  Airway Mallampati: II  TM Distance: >3 FB Neck ROM: Full    Dental no notable dental hx.    Pulmonary sleep apnea , former smoker,  breath sounds clear to auscultation  Pulmonary exam normal       Cardiovascular hypertension, Pt. on medications + Peripheral Vascular Disease Normal cardiovascular examRhythm:Regular Rate:Normal     Neuro/Psych negative neurological ROS  negative psych ROS   GI/Hepatic negative GI ROS, Neg liver ROS,   Endo/Other  negative endocrine ROS  Renal/GU negative Renal ROS  negative genitourinary   Musculoskeletal  (+) Arthritis -,   Abdominal   Peds negative pediatric ROS (+)  Hematology  (+) anemia ,   Anesthesia Other Findings   Reproductive/Obstetrics negative OB ROS                           Anesthesia Physical Anesthesia Plan  ASA: III  Anesthesia Plan: MAC   Post-op Pain Management:    Induction: Intravenous  Airway Management Planned: Natural Airway  Additional Equipment:   Intra-op Plan:   Post-operative Plan:   Informed Consent: I have reviewed the patients History and Physical, chart, labs and discussed the procedure including the risks, benefits and alternatives for the proposed anesthesia with the patient or authorized representative who has indicated his/her understanding and acceptance.   Dental advisory given  Plan Discussed with: CRNA  Anesthesia Plan Comments:        Anesthesia Quick Evaluation

## 2014-05-21 ENCOUNTER — Ambulatory Visit (HOSPITAL_COMMUNITY): Payer: Medicare Other | Admitting: Anesthesiology

## 2014-05-21 ENCOUNTER — Encounter (HOSPITAL_COMMUNITY)
Admission: RE | Disposition: A | Discharge status: Home / Self Care | Payer: Self-pay | Source: Ambulatory Visit | Attending: Gastroenterology

## 2014-05-21 ENCOUNTER — Encounter (HOSPITAL_COMMUNITY): Payer: Self-pay | Admitting: Gastroenterology

## 2014-05-21 ENCOUNTER — Ambulatory Visit (HOSPITAL_COMMUNITY)
Admission: RE | Admit: 2014-05-21 | Discharge: 2014-05-21 | Disposition: A | Discharge status: Home / Self Care | Payer: Medicare Other | Source: Ambulatory Visit | Attending: Gastroenterology | Admitting: Gastroenterology

## 2014-05-21 DIAGNOSIS — I739 Peripheral vascular disease, unspecified: Secondary | ICD-10-CM | POA: Diagnosis not present

## 2014-05-21 DIAGNOSIS — Z85828 Personal history of other malignant neoplasm of skin: Secondary | ICD-10-CM | POA: Diagnosis not present

## 2014-05-21 DIAGNOSIS — G4733 Obstructive sleep apnea (adult) (pediatric): Secondary | ICD-10-CM | POA: Insufficient documentation

## 2014-05-21 DIAGNOSIS — Z888 Allergy status to other drugs, medicaments and biological substances status: Secondary | ICD-10-CM | POA: Diagnosis not present

## 2014-05-21 DIAGNOSIS — K621 Rectal polyp: Secondary | ICD-10-CM | POA: Insufficient documentation

## 2014-05-21 DIAGNOSIS — M1990 Unspecified osteoarthritis, unspecified site: Secondary | ICD-10-CM | POA: Insufficient documentation

## 2014-05-21 DIAGNOSIS — K5730 Diverticulosis of large intestine without perforation or abscess without bleeding: Secondary | ICD-10-CM | POA: Insufficient documentation

## 2014-05-21 DIAGNOSIS — D649 Anemia, unspecified: Secondary | ICD-10-CM | POA: Insufficient documentation

## 2014-05-21 DIAGNOSIS — Z1211 Encounter for screening for malignant neoplasm of colon: Secondary | ICD-10-CM | POA: Insufficient documentation

## 2014-05-21 DIAGNOSIS — Z90710 Acquired absence of both cervix and uterus: Secondary | ICD-10-CM | POA: Diagnosis not present

## 2014-05-21 DIAGNOSIS — Z6841 Body Mass Index (BMI) 40.0 and over, adult: Secondary | ICD-10-CM | POA: Insufficient documentation

## 2014-05-21 DIAGNOSIS — I10 Essential (primary) hypertension: Secondary | ICD-10-CM | POA: Insufficient documentation

## 2014-05-21 DIAGNOSIS — Z86718 Personal history of other venous thrombosis and embolism: Secondary | ICD-10-CM | POA: Diagnosis not present

## 2014-05-21 DIAGNOSIS — Z882 Allergy status to sulfonamides status: Secondary | ICD-10-CM | POA: Insufficient documentation

## 2014-05-21 DIAGNOSIS — Z87891 Personal history of nicotine dependence: Secondary | ICD-10-CM | POA: Insufficient documentation

## 2014-05-21 DIAGNOSIS — Z91041 Radiographic dye allergy status: Secondary | ICD-10-CM | POA: Insufficient documentation

## 2014-05-21 HISTORY — DX: Diverticulitis of intestine, part unspecified, without perforation or abscess without bleeding: K57.92

## 2014-05-21 HISTORY — PX: COLONOSCOPY WITH PROPOFOL: SHX5780

## 2014-05-21 SURGERY — COLONOSCOPY WITH PROPOFOL
Anesthesia: Monitor Anesthesia Care

## 2014-05-21 MED ORDER — PROPOFOL 10 MG/ML IV BOLUS
INTRAVENOUS | Status: AC
  Filled 2014-05-21: qty 20

## 2014-05-21 MED ORDER — SODIUM CHLORIDE 0.9 % IV SOLN: INTRAVENOUS | Status: DC

## 2014-05-21 MED ORDER — PROPOFOL 500 MG/50ML IV EMUL
INTRAVENOUS | Status: DC | PRN
  Administered 2014-05-21: 50 mg via INTRAVENOUS
  Administered 2014-05-21 (×2): 20 mg via INTRAVENOUS

## 2014-05-21 MED ORDER — PROPOFOL INFUSION 10 MG/ML OPTIME
INTRAVENOUS | Status: DC | PRN
  Administered 2014-05-21: 120 ug/kg/min via INTRAVENOUS

## 2014-05-21 MED ORDER — LIDOCAINE HCL (CARDIAC) 20 MG/ML IV SOLN
INTRAVENOUS | Status: AC
  Filled 2014-05-21: qty 5

## 2014-05-21 MED ORDER — LACTATED RINGERS IV SOLN
INTRAVENOUS | Status: DC
  Administered 2014-05-21: 07:00:00 via INTRAVENOUS
  Administered 2014-05-21: 1000 mL via INTRAVENOUS

## 2014-05-21 SURGICAL SUPPLY — 21 items

## 2014-05-21 NOTE — Anesthesia Postprocedure Evaluation (Signed)
  Anesthesia Post-op Note  Patient: [name]  Procedure(s) Performed: Procedure(s) (LRB): COLONOSCOPY WITH PROPOFOL (N/A)  Patient Location: PACU  Anesthesia Type: MAC  Level of Consciousness: awake and alert   Airway and Oxygen Therapy: Patient Spontanous Breathing  Post-op Pain: mild  Post-op Assessment: Post-op Vital signs reviewed, Patient's Cardiovascular Status Stable, Respiratory Function Stable, Patent Airway and No signs of Nausea or vomiting  Last Vitals:  Filed Vitals:   05/21/14 0830  BP: 151/100  Pulse: 79  Temp:   Resp: 18    Post-op Vital Signs: stable   Complications: No apparent anesthesia complications

## 2014-05-21 NOTE — Op Note (Signed)
[facility] [place], [id number]   OPERATIVE PROCEDURE REPORT  PATIENT: [name]  MR#: [id number] BIRTHDATE: [birth year] GENDER: female ENDOSCOPIST: Edmonia James, MD ASSISTANT:   William Dalton technician & Cleda Daub, RN PROCEDURE DATE: 01-Jun-2014 PRE-PROCEDURE PREPARATION: Patient fasted for 4 hours prior to procedure. The patient was prepped with a gallon of Golytely the night prior to the procedure.  The patient has fasted for 4 hours prior to the procedure PRE-PROCEDURE PHYSICAL: Patient has stable vital signs.  Neck is supple.  There is no JVD, thyromegaly or LAD.  Chest clear to auscultation.  S1 and S2 regular.  Abdomen soft, morbidly obese, non-distended, non-tender with NABS. PROCEDURE:     Colonoscopy with biopsy ASA CLASS:     Class III INDICATIONS:     1.  Colorectal cancer screeningf 2. Personal history of colonic polyps. MEDICATIONS:     Monitored anesthesia care  DESCRIPTION OF PROCEDURE:   After the risks, benefits, and alternatives of the procedure were thoroughly explained [including a 10% missed rate of cancer and polyps], informed consent was obtained.  Digital rectal exam was performed.  The [id number] ([id number])  was introduced through the anus  and advanced to the cecum, which was identified by both the appendix and ileocecal valve , limited by No adverse events experienced.   The quality of the prep was good. . Multiple washes were done. Small lesions could be missed. The instrument was then slowly withdrawn as the colon was fully examined. Estimated blood loss is zero unless otherwise noted in this procedure report.     COLON FINDINGS: There was moderate diverticulosis noted in the sigmoid colon. Three dimunitive polyps were found in the rectum. These were removed by cold biopsies x 3. The rest of the colonic mucosa appeared healthy with a normal vascular pattern.  No masses or AVMs were noted. The  appendiceal orifice and the ICV were identified and photographed. Retroflexed views revealed no abnormalities.  The patient tolerated the procedure without immediate complications.  The scope was then withdrawn from the patient and the procedure terminated.  TIME TO CECUM:   4 minutes 00 seconds WITHDRAW TIME:  6 minutes 00 seconds  IMPRESSION:     1.  Moderate diverticulosis in the sigmoid colon. 2.  Three dimunitive polyps were found in the rectum-removed by cold biopsies.  RECOMMENDATIONS:     1.  Await pathology results. 2.  Continue current medications including Xarelto. 3.  Continue surveillance. 4.  High fiber diet with liberal fluid intake. 5.  OP follow-up is advised on a PRN basis.  REPEAT EXAM:      In 5 years  for a repeat colonoscopy.  If the patient has any abnormal GI symptoms in the interim, she have been advised to contact the office as soon as possible for further recommendations.   REFERRED BM:WUXLKG Everlene Farrier, M.D. eSigned:  Edmonia James, MD 06-01-2014 8:07 AM rd CPT CODES:     [id number] Colonoscopy, flexible, proximal to splenic flexure; with biopsy, single or multiple ICD CODES:     K63.5 Colon polyps, K57.30 Diverticulosis, Z12.11 Encounter for screening for malignant neoplasm of colon  The ICD and CPT codes recommended by this software are interpretations from the data that the clinical staff has captured with the software.  The verification of the translation of this report to the ICD and CPT codes and modifiers is the sole responsibility of the health care institution and practicing physician where this report  was generated.  [employer] will not be held responsible for the validity of the ICD and CPT codes included on this report.  AMA assumes no liability for data contained or not contained herein. CPT is a Designer, television/film set of the [employer].  PATIENT NAME:  [name], [name] MR#: [id number]

## 2014-05-21 NOTE — H&P (Signed)
[name] is an 64 y.o. female.    Chief Complaint: Colorectal cancer screening.  HPI: Patient is here for a surviellance colonoscopy. See office notes for details.  Past Medical History  Diagnosis Date  . HTN (hypertension)   . Hyperlipidemia   . DVT (deep vein thrombosis) in pregnancy     LEFT LEG  . Bronchitis   . 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 Basal cell carcinoma of nose   . Shingles   . OSA (obstructive sleep apnea)     hypoventilation, on CPAP  . Femoral DVT (deep venous thrombosis) 2008    -"wears compression hose all times" left leg  . Diverticulitis     past history  . Anemia     oral iron occ.   Past Surgical History  Procedure Laterality Date  . Total abdominal hysterectomy w/ bilateral salpingoophorectomy  2005    Stage IA ovarian cancer  . Basal cell carcinoma excision      resection with plastic surgery reconstrustion  . Cataract surgery Bilateral [id number], 2005    with bilateral lens replacements  . Bilateral breast mass  1970    benigned, left breast  . Hernia repair  7034    umbilical repair with mesh    Family History  Problem Relation Age of Onset  . Alzheimer's disease Mother   . Hypertension Mother   . Alzheimer's disease Father   . Hypertension Father   . Colon cancer Father     81  . Hypertension Sister   . Hypertension Brother    Social History:  reports that she has quit smoking. She has never used smokeless tobacco. She reports that she drinks alcohol. She reports that she does not use illicit drugs.  Allergies:  Allergies  Allergen Reactions  . Valtrex [Valacyclovir Hcl] Swelling and Rash    Swelling of face and arm, rash  . Benoxinate Base     Turns eyes blood red/burning. --in eyedrops--  . Sulfonamide Derivatives   . Ivp Dye [Iodinated Diagnostic Agents] Rash    Rash on back   Medications Prior to Admission  Medication Sig Dispense Refill  . atorvastatin (LIPITOR) 40 MG tablet Take 1  tablet (40 mg total) by mouth daily. (Patient taking differently: Take 40 mg by mouth every evening. ) 90 tablet 3  . calcium carbonate (OS-CAL) 600 MG TABS Take 600 mg by mouth 2 (two) times daily with a meal.      . cetirizine (ZYRTEC) 10 MG tablet Take 10 mg by mouth every morning.     . ferrous sulfate 325 (65 FE) MG tablet Take 325 mg by mouth every evening.     . fish oil-omega-3 fatty acids 1000 MG capsule Take 1-2 g by mouth 2 (two) times daily. Takes 1g in the morning and 2 g in the evening.    Marland Kitchen lisinopril-hydrochlorothiazide (PRINZIDE,ZESTORETIC) 20-25 MG per tablet Take 1 tablet by mouth daily. PATIENT NEEDS AN OFFICE VISIT FOR ADDITIONAL REFILLS. 2nd NOTICE (Patient taking differently: Take 1 tablet by mouth every morning. ) 90 tablet 3  . Multiple Vitamin (MULTIVITAMIN) tablet Take 1 tablet by mouth every morning.     . niacin 500 MG tablet Take 500 mg by mouth every evening.     . rivaroxaban (XARELTO) 20 MG TABS tablet TAKE 1 TABLET EVERY DAY (Patient taking differently: Take 20 mg by mouth daily with supper. ) 90 tablet 3  . vitamin E  400 UNIT capsule Take 400 Units by mouth every morning.     . nystatin cream (MYCOSTATIN) Apply 1 application topically 2 (two) times a week. As needed for rash under breast.     Review of Systems  Constitutional: Negative.   HENT: Negative.   Eyes: Negative.   Cardiovascular: Positive for leg swelling.  Gastrointestinal: Negative.   Neurological: Negative.   Psychiatric/Behavioral: Negative.    Blood pressure 148/81, pulse 84, temperature 97.9 F (36.6 C), temperature source Oral, resp. rate 21, height 5' 7" (1.702 m), weight 146.965 kg (324 lb), SpO2 94 %. Physical Exam  Constitutional: She is oriented to person, place, and time. She appears well-developed and well-nourished.  Morbidly obese  HENT:  Head: Normocephalic and atraumatic.  Eyes: Conjunctivae and EOM are normal. Pupils are equal, round, and reactive to light.  Neck: Normal  range of motion. Neck supple.  Cardiovascular: Normal rate and regular rhythm.   GI: Soft. Bowel sounds are normal.  Neurological: She is alert and oriented to person, place, and time.  Skin: Skin is warm and dry.  Psychiatric: She has a normal mood and affect.   Assessment/Plan Colorectal cancer screening; proceed with a colonoscopy at this time.*  [name] 05/21/2014, 7:26 AM

## 2014-05-21 NOTE — Discharge Instructions (Signed)
Colonoscopy, Care After °These instructions give you information on caring for yourself after your procedure. Your doctor may also give you more specific instructions. Call your doctor if you have any problems or questions after your procedure. °HOME CARE °· Do not drive for 24 hours. °· Do not sign important papers or use machinery for 24 hours. °· You may shower. °· You may go back to your usual activities, but go slower for the first 24 hours. °· Take rest breaks often during the first 24 hours. °· Walk around or use warm packs on your belly (abdomen) if you have belly cramping or gas. °· Drink enough fluids to keep your pee (urine) clear or pale yellow. °· Resume your normal diet. Avoid heavy or fried foods. °· Avoid drinking alcohol for 24 hours or as told by your doctor. °· Only take medicines as told by your doctor. °If a tissue sample (biopsy) was taken during the procedure:  °· Do not take aspirin or blood thinners for 7 days, or as told by your doctor. °· Do not drink alcohol for 7 days, or as told by your doctor. °· Eat soft foods for the first 24 hours. °GET HELP IF: °You still have a small amount of blood in your poop (stool) 2-3 days after the procedure. °GET HELP RIGHT AWAY IF: °· You have more than a small amount of blood in your poop. °· You see clumps of tissue (blood clots) in your poop. °· Your belly is puffy (swollen). °· You feel sick to your stomach (nauseous) or throw up (vomit). °· You have a fever. °· You have belly pain that gets worse and medicine does not help. °MAKE SURE YOU: °· Understand these instructions. °· Will watch your condition. °· Will get help right away if you are not doing well or get worse. °Document Released: 01/30/2010 Document Revised: 01/02/2013 Document Reviewed: 09/04/2012 °[employer]® Patient Information ©2015 [employer]. This information is not intended to replace advice given to you by your health care provider. Make sure you discuss any questions you have with  your health care provider. ° °

## 2014-05-21 NOTE — Transfer of Care (Signed)
Immediate Anesthesia Transfer of Care Note  Patient: [place]  Procedure(s) Performed: Procedure(s): COLONOSCOPY WITH PROPOFOL (N/A)  Patient Location: PACU  Anesthesia Type:MAC  Level of Consciousness: awake, alert  and oriented  Airway & Oxygen Therapy: Patient Spontanous Breathing and Patient connected to face mask oxygen  Post-op Assessment: Report given to RN and Post -op Vital signs reviewed and stable  Post vital signs: Reviewed and stable  Last Vitals:  Filed Vitals:   05/21/14 0658  BP: 148/81  Pulse: 84  Temp: 36.6 C  Resp: 21    Complications: No apparent anesthesia complications

## 2014-05-22 ENCOUNTER — Encounter (HOSPITAL_COMMUNITY): Payer: Self-pay | Admitting: Gastroenterology

## 2014-07-04 ENCOUNTER — Ambulatory Visit: Payer: Medicare Other | Admitting: Neurology

## 2014-07-05 ENCOUNTER — Ambulatory Visit: Payer: Medicare Other | Admitting: Neurology

## 2014-07-09 ENCOUNTER — Ambulatory Visit (INDEPENDENT_AMBULATORY_CARE_PROVIDER_SITE_OTHER): Payer: Medicare Other | Admitting: Obstetrics & Gynecology

## 2014-07-09 ENCOUNTER — Encounter: Payer: Self-pay | Admitting: Obstetrics & Gynecology

## 2014-07-09 VITALS — BP 124/72 | HR 64 | Resp 16 | Ht 66.5 in | Wt 321.0 lb

## 2014-07-09 DIAGNOSIS — Z01419 Encounter for gynecological examination (general) (routine) without abnormal findings: Secondary | ICD-10-CM

## 2014-07-09 DIAGNOSIS — C569 Malignant neoplasm of unspecified ovary: Secondary | ICD-10-CM

## 2014-07-09 NOTE — Progress Notes (Signed)
64 y.o. G0P0 SingleCaucasianF here for annual exam.  Having some breast pain on left side, outer, about 4 o'clock.  H/o left breast calcifications.  Feels this may be related to a bra.  Want me to check this out.    Cardiologist:  Dr. Johnsie Cancel.  Switched to Xarelto two years ago.  Has been really happy with the switch.  PCP:  Dr. Everlene Farrier.  [facility] [address].    No LMP recorded. Patient has had a hysterectomy.          Sexually active: No.  The current method of family planning is status post hysterectomy.    Exercising: Yes.    physical therapy, now swimming Smoker:  Only for 6 months in the 70s  Health Maintenance: Pap:  ? Years ago History of abnormal Pap:  yes MMG:  07/27/13 3D-MMG, 07/31/13-left diag, 02/13/14 6 month f/u left diag,  Bilateral MMG scheduled for 08/21/14 Colonoscopy:  05/21/14-repeat in 5 years BMD:   07/26/12-normal range TDaP:  PCP Screening Labs: PCP, Hb today: PCP, Urine today: PCP   reports that she has never smoked. She has never used smokeless tobacco. She reports that she drinks alcohol. She reports that she does not use illicit drugs.  Past Medical History  Diagnosis Date  . HTN (hypertension)   . Hyperlipidemia   . DVT (deep vein thrombosis) in pregnancy     LEFT LEG  . Bronchitis   . 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 Basal cell carcinoma of nose   . Shingles   . OSA (obstructive sleep apnea)     hypoventilation, on CPAP  . Femoral DVT (deep venous thrombosis) 2008    -"wears compression hose all times" left leg  . Diverticulitis     past history  . Anemia     oral iron occ.    Past Surgical History  Procedure Laterality Date  . Total abdominal hysterectomy w/ bilateral salpingoophorectomy  2005    Stage IA ovarian cancer  . Basal cell carcinoma excision      resection with plastic surgery reconstrustion  . Cataract surgery Bilateral [id number], 2005    with bilateral lens replacements  . Bilateral breast mass  1970   benigned, left breast  . Hernia repair  6606    umbilical repair with mesh   . Colonoscopy with propofol N/A 05/21/2014    Procedure: COLONOSCOPY WITH PROPOFOL;  Surgeon: Juanita Craver, MD;  Location: [facility];  Service: Endoscopy;  Laterality: N/A;    Current Outpatient Prescriptions  Medication Sig Dispense Refill  . atorvastatin (LIPITOR) 40 MG tablet Take 1 tablet (40 mg total) by mouth daily. (Patient taking differently: Take 40 mg by mouth every evening. ) 90 tablet 3  . calcium carbonate (OS-CAL) 600 MG TABS Take 600 mg by mouth 2 (two) times daily with a meal.      . cetirizine (ZYRTEC) 10 MG tablet Take 10 mg by mouth every morning.     . EUFLEXXA 20 MG/2ML SOSY Every 6 months    . ferrous sulfate 325 (65 FE) MG tablet Take 325 mg by mouth every evening.     . fish oil-omega-3 fatty acids 1000 MG capsule Take 1-2 g by mouth 2 (two) times daily. Takes 1g in the morning and 2 g in the evening.    Marland Kitchen lisinopril-hydrochlorothiazide (PRINZIDE,ZESTORETIC) 20-25 MG per tablet Take 1 tablet by mouth daily. PATIENT NEEDS AN OFFICE VISIT FOR ADDITIONAL REFILLS.  2nd NOTICE (Patient taking differently: Take 1 tablet by mouth every morning. ) 90 tablet 3  . Multiple Vitamin (MULTIVITAMIN) tablet Take 1 tablet by mouth every morning.     . niacin 500 MG tablet Take 500 mg by mouth every evening.     . rivaroxaban (XARELTO) 20 MG TABS tablet TAKE 1 TABLET EVERY DAY (Patient taking differently: Take 20 mg by mouth daily with supper. ) 90 tablet 3  . vitamin E 400 UNIT capsule Take 400 Units by mouth every morning.      No current facility-administered medications for this visit.    Family History  Problem Relation Age of Onset  . Alzheimer's disease Mother   . Hypertension Mother   . Alzheimer's disease Father   . Hypertension Father   . Colon cancer Father     61  . Hypertension Sister   . Hypertension Brother     ROS:  Pertinent items are noted in HPI.  Otherwise, a comprehensive ROS  was negative.  Exam:   General appearance: alert, cooperative and appears stated age Head: Normocephalic, without obvious abnormality, atraumatic Neck: no adenopathy, supple, symmetrical, trachea midline and thyroid normal to inspection and palpation Lungs: clear to auscultation bilaterally Breasts: normal appearance, no masses or tenderness Heart: regular rate and rhythm Abdomen: soft, non-tender; bowel sounds normal; no masses,  no organomegaly Extremities: extremities normal, atraumatic, no cyanosis or edema Skin: Skin color, texture, turgor normal. No rashes or lesions Lymph nodes: Cervical, supraclavicular, and axillary nodes normal. No abnormal inguinal nodes palpated Neurologic: Grossly normal   Pelvic: External genitalia:  no lesions              Urethra:  normal appearing urethra with no masses, tenderness or lesions              Bartholins and Skenes: normal                 Vagina: normal appearing vagina with normal color and discharge, no lesions              Cervix: absent              Pap taken: No. Bimanual Exam:  Uterus:  uterus absent              Adnexa: no mass, fullness, tenderness               Rectovaginal: Confirms               Anus:  normal sphincter tone, no lesions  Chaperone was present for exam.  A:  Well Woman with normal exam H/O Ovarian cancer, 1A. S/p TAH/BSO. H/O large left LE DVT. On chronic anticoagulation, now with Xarelto Obesity Family hx of breast cancer (niece).  Skin candida New left breast pain  P: Mammogram yearly.  No pap smear. H/O TAH/BSO due to ovarian cancer. Has declined genetic (BRCA) testing Ca-125 yearly. Have checked with Dr. Fermin Schwab regarding this. No additional testing needed. Knows tetenus is due. Declines but states will do with PCP return annually or prn

## 2014-07-10 LAB — CA 125: CA 125: 5 U/mL (ref <35)

## 2014-07-13 ENCOUNTER — Ambulatory Visit (INDEPENDENT_AMBULATORY_CARE_PROVIDER_SITE_OTHER): Payer: Medicare Other | Admitting: Emergency Medicine

## 2014-07-13 ENCOUNTER — Encounter: Payer: Self-pay | Admitting: Emergency Medicine

## 2014-07-13 VITALS — BP 134/78 | HR 93 | Temp 98.8°F | Resp 18 | Ht 67.5 in | Wt 319.0 lb

## 2014-07-13 DIAGNOSIS — Z23 Encounter for immunization: Secondary | ICD-10-CM

## 2014-07-13 DIAGNOSIS — H6123 Impacted cerumen, bilateral: Secondary | ICD-10-CM

## 2014-07-13 NOTE — Progress Notes (Signed)
   Subjective:  This chart was scribed for Nena Jordan, MD by [facility], medical scribe at [facility].The patient was seen in exam room 10 and the patient's care was started at 8:17 AM.   Patient ID: [name], female    DOB: August 12, 1950, 64 y.o.   MRN: [id number] Chief Complaint  Patient presents with  . Ear Fullness    left, x 1 day, swimming   HPI HPI Comments: [name] is a 64 y.o. female who presents to [facility] complaining of bilateral ear pressure with cerumen impaction left greater than worse. She was swimming yesterday and dove underwater, short after there this began feeling this pressure. She tried cleaning her ears with alcohol, vinegar, and  ear wax drops for no relief.  Pt would also like a tetanus shot today. Last tetanus was in 2004.  Review of Systems  HENT:       Positive for ear pressure, cerumen impaction      Objective:  BP 134/78 mmHg  Pulse 93  Temp(Src) 98.8 F (37.1 C)  Resp 18  Ht 5' 7.5" (1.715 m)  Wt 319 lb (144.697 kg)  BMI 49.20 kg/m2  SpO2 98% Physical Exam  Vitals reviewed. CONSTITUTIONAL: Well developed/well nourished HEAD: Normocephalic/atraumatic. In the left external auditory canal there is a whitish debris that is obstructing the canal. In the right external auditory canal there is cerumen present. EYES: EOMI/PERRL ENMT: Mucous membranes moist NECK: supple no meningeal signs SPINE/BACK:entire spine nontender CV: S1/S2 noted, no murmurs/rubs/gallops noted LUNGS: Lungs are clear to auscultation bilaterally, no apparent distress ABDOMEN: soft, nontender, no rebound or guarding, bowel sounds noted throughout abdomen GU:no cva tenderness NEURO: Pt is awake/alert/appropriate, moves all extremitiesx4.  No facial droop.   EXTREMITIES: pulses normal/equal, full ROM SKIN: warm, color normal PSYCH: no abnormalities of mood noted, alert and oriented to situation     Assessment & Plan:   Ear irrigated with relief patient tolerated well.I personally performed the services described in this documentation, which was scribed in my presence. The recorded information has been reviewed and is accurate.  Nena Jordan, MD

## 2014-07-22 ENCOUNTER — Ambulatory Visit (INDEPENDENT_AMBULATORY_CARE_PROVIDER_SITE_OTHER): Payer: Medicare Other | Admitting: Neurology

## 2014-07-22 ENCOUNTER — Encounter: Payer: Self-pay | Admitting: Neurology

## 2014-07-22 VITALS — BP 126/82 | HR 78 | Resp 20 | Ht 67.0 in | Wt 316.0 lb

## 2014-07-22 DIAGNOSIS — G4733 Obstructive sleep apnea (adult) (pediatric): Secondary | ICD-10-CM | POA: Diagnosis not present

## 2014-07-22 DIAGNOSIS — G4730 Sleep apnea, unspecified: Secondary | ICD-10-CM | POA: Insufficient documentation

## 2014-07-22 NOTE — Patient Instructions (Signed)

## 2014-07-22 NOTE — Progress Notes (Signed)
[facility]  Provider:  Dr Keyra Virella Referring Provider: Darlyne Russian, MD Primary Care Physician:  [name], MD  Chief Complaint  Patient presents with  . Follow-up    cpap, rm 11, alone    HPI:  [name] is a 64 y.o. female here as a referral from Dr. Everlene Farrier for her yearly follow up visit, the patent has OSA and is on CPAP therapy. Review of Systems:Out of a complete 14 system review, the patient complains of only the following symptoms, and all other reviewed systems are negative.  Her FSS is 11 points and Epworth is 1 points, good results. No nocturia , dry nose in AM.  Her CPAP download: 07-03-13; 7 hours 17 minutes nightly use over the 90 days download period. 98% compliance . AHI 0.8,  8 cm water pressure setting and 2 cm EPR. Very good result. BMI remains elevated , her main risk factor is her obesity.  Interval history on 07-22-14. [name] brought her download from her CPAP machine was referred her today she has used the CPAP 99% over the last 90 days and 99% over 4 hours her average user time is 7 hours and 31 minutes the machine is set at 8 cm water was 2 cm EPR residual AHI is 0.7 compliance is excellent the needs to be no adjustments made be also obtained an Epworth sleepiness score at 3 points the fatigue severity score at 13 points both in good range. Geriatric depression score was endorsed at one single point. No adjustments need to be made at this point patient will get her refill and supplies through her advanced home care provider.  Lost another 6 pounds, joined the aquatic center. 07-22-14   Last years visit note : 2014 . [name] ,a 64 year old, [name], right-handed female patient of Dr.Daub's is followed here for a sleep apnea.  The patient underwent a split study on 9-21 2012, after complaining of excessive daytime fatigue, sleepiness, witnessed apneas and fragmented sleep at night she also was easily short of breath. The patient at  the past medical history of morbid obesity, hypertension and hyperlipidemia. At the time of her sleep study her BMI was 46.7 and neck circumference 17 inches her blood pressure was also slightly elevated 141/81 mm Hg. The patient had been on Coumadin for DVT at the time. She tested positive for an AHI of 120 and had to be immediately titrated to a pressure of 9 cm water was reduced for AHI to 0.0 he also was able to enter REM sleep. There was borderline hypoxia noted with hypoventilation and periodic limb movements were also very frequent at night. In February bry 2013 she followed up  90 days download- and was using CPAP nightly , had not gained weight and a download revealed a residual AHI on CPAP of 8 cm water of 1.6 . She has meanwhile tried to further was weight especially since he also has had pain and pain due to arthritis. A download from 4-17 2013 showed an AHI of 1.1, average daily usage of 6 hours 56 minutes set pressure of 8 cm water with a 2 cm EPR. Today her Epworth sleepiness score is 4 points her fatigue score 25 and the patient has continued to use compliantly her CPAP. We were unable to obtain a download in the office today. The patient reports her normal sleep time is from 12:30 midnight to 7:30 AM, she normally falls asleep within 5 minutes. She has no longer  nocturia but had nocturia before starting CPAP. She averages about 7 hours of sleep at night. When she wakes up she feels restored, she still takes one nap daily but after lunch about the duration of 30-45 minutes. She reports laughingly that her whole family naps !The nap is perceived as refreshing. Does not recall any dreams or nightmares or any dreams activity sleepwalking or sleep talking. She does not snore through the machine. She retired since last visit and is free to nap now. She is losing weight, about 6 pounds a month.     History   Social History  . Marital Status: Single    Spouse Name: N/A  . Number of Children: 0  .  Years of Education: masters   Occupational History  . 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 Smoking status: Never Smoker   . Smokeless tobacco: Never Used     Comment: only for 6 months in the 70s  . Alcohol Use: 0.0 oz/week    0 Standard drinks or equivalent per week     Comment: rare, once per month  . Drug Use: No  . Sexual Activity: No   Other Topics Concern  . 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 PSH showed an AHI of 120!!!! titrated to 8 cm watr cPAP , residual AHI of 1 and downlaod was done  01-23-11 .  excellent compliance - 7 hours and low leak,  nasal pillow mask.    BMI is considered morbidly obese.  discussed low carb  diet - weight watchers and restricted exercise. , aquatic .    Patient cannot exercise due to soreness in proximal muscles.and  would like to add coenzyme q 10 and carnitine . She needs a medical weight loss regimen - bariatric surgery?       FSS 48, Epworth 4 again ,  CMS compliant  residual AHI 1.1 and average use 6.48  hours , no naps    Patient is single and lives alone.   Patient does not have any children.   Patient is retired.   Patient has a Master's degree.   Patient is left-handed.   Patient drinks tea occasionally.              Family History  Problem Relation Age of Onset  . Alzheimer's disease Mother   . Hypertension Mother   . Alzheimer's disease Father   . Hypertension Father   . Colon cancer Father     107  . Hypertension Sister   . Hypertension Brother     Past Medical History  Diagnosis Date  . HTN (hypertension)   . Hyperlipidemia   . DVT (deep vein thrombosis) in pregnancy     LEFT LEG  . Bronchitis   . 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 Basal cell carcinoma of nose   . Shingles   . OSA (obstructive sleep apnea)     hypoventilation, on CPAP  . Femoral DVT (deep venous thrombosis) 2008     -"wears compression hose all times" left leg  . Diverticulitis     past history  . Anemia     oral iron occ.    Past Surgical History  Procedure Laterality Date  . Total abdominal hysterectomy w/ bilateral salpingoophorectomy  2005    Stage IA  ovarian cancer  . Basal cell carcinoma excision      resection with plastic surgery reconstrustion  . Cataract surgery Bilateral [id number], 2005    with bilateral lens replacements  . Bilateral breast mass  1970    benigned, left breast  . Hernia repair  8182    umbilical repair with mesh   . Colonoscopy with propofol N/A 05/21/2014    Procedure: COLONOSCOPY WITH PROPOFOL;  Surgeon: Juanita Craver, MD;  Location: [facility];  Service: Endoscopy;  Laterality: N/A;    Current Outpatient Prescriptions  Medication Sig Dispense Refill  . atorvastatin (LIPITOR) 40 MG tablet Take 1 tablet (40 mg total) by mouth daily. (Patient taking differently: Take 40 mg by mouth every evening. ) 90 tablet 3  . calcium carbonate (OS-CAL) 600 MG TABS Take 600 mg by mouth 2 (two) times daily with a meal.      . cetirizine (ZYRTEC) 10 MG tablet Take 10 mg by mouth every morning.     . EUFLEXXA 20 MG/2ML SOSY Every 6 months    . ferrous sulfate 325 (65 FE) MG tablet Take 325 mg by mouth every evening.     . fish oil-omega-3 fatty acids 1000 MG capsule Take 1-2 g by mouth 2 (two) times daily. Takes 1g in the morning and 2 g in the evening.    Marland Kitchen lisinopril-hydrochlorothiazide (PRINZIDE,ZESTORETIC) 20-25 MG per tablet Take 1 tablet by mouth daily. PATIENT NEEDS AN OFFICE VISIT FOR ADDITIONAL REFILLS. 2nd NOTICE (Patient taking differently: Take 1 tablet by mouth every morning. ) 90 tablet 3  . Multiple Vitamin (MULTIVITAMIN) tablet Take 1 tablet by mouth every morning.     . niacin 500 MG tablet Take 500 mg by mouth every evening.     . rivaroxaban (XARELTO) 20 MG TABS tablet TAKE 1 TABLET EVERY DAY (Patient taking differently: Take 20 mg by mouth daily with supper. )  90 tablet 3  . vitamin E 400 UNIT capsule Take 400 Units by mouth every morning.      No current facility-administered medications for this visit.    Allergies as of 07/22/2014 - Review Complete 07/22/2014  Allergen Reaction Noted  . Valtrex [valacyclovir hcl] Swelling and Rash 10/20/2011  . Benoxinate base  12/04/2010  . Sulfonamide derivatives  07/18/2008  . Ivp dye [iodinated diagnostic agents] Rash 12/04/2010    Vitals: BP 126/82 mmHg  Pulse 78  Resp 20  Ht '5\' 7"'  (1.702 m)  Wt 316 lb (143.337 kg)  BMI 49.48 kg/m2 Last Weight:  Wt Readings from Last 1 Encounters:  07/22/14 316 lb (143.337 kg)   Last Height:   Ht Readings from Last 1 Encounters:  07/22/14 '5\' 7"'  (1.702 m)     Physical exam:  General: The patient is awake, alert and appears not in acute distress. The patient is well groomed. Head: Normocephalic, atraumatic. Neck is supple. Mallampati 4 ,  neck circumference: 18 inches  - no retrognathia.  No TMJ .Short, thick neck  Cardiovascular:  Regular rate and rhythm, without  murmurs or carotid bruit, and without distended neck veins. Respiratory: Lungs are clear to auscultation. Patient has restricted deep breathing.  Skin: ankle  Edema, this patient has unusual finger nail deformities- spooned , dimpled.  Trunk: BMI is morbidly obese , has normal posture.  Neurologic exam : The patient is awake and alert, oriented to place and time.  Memory subjective  described as intact. There is a normal attention span & concentration ability.  Speech is fluent  without dysarthria, dysphonia or aphasia. Mood and affect are appropriate.  Cranial nerves: Pupils are equal and briskly reactive to light. Funduscopic exam without  evidence of pallor or edema. Extraocular movements  in vertical and horizontal planes intact and without nystagmus. Visual fields by finger perimetry are intact. Hearing to finger rub intact.  Facial sensation intact to fine touch. Facial motor strength is  symmetric and tongue and uvula move midline. Motor exam:   Normal tone and normal muscle bulk and symmetric normal strength in all extremities. Crepitation over knees , pain with ROM in her hips.  Sensory:  Fine touch, pinprick and vibration were tested in all extremities. Coordination: Rapid alternating movements in the fingers/hands is  normal. without evidence of ataxia, dysmetria or tremor. Gait and station: Patient walks without assistive device . Wide based.  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 Severe AHI of 120 in the patient baseline study completely corrected of only 8 cm of water pressure and no additional oxygen was prescribed the residual AHI was 0.7,  the average daily usage 7 hours and 31 minutes 2016- Continue using at the current pressure.  2 )Very high  body mass index. I suggested to the patient to look into the 2 and 5 day diet, although known as  intermittent fast.  The patient would be restricted to 500 kcal for nonconsecutive days a week, and has no calorie restrictions on the other days. Is encouraged to hydrate well, and to keep her vitamin K intake in mind , as she continues to be on warfarin. She is now swimming more regularly .

## 2014-07-23 ENCOUNTER — Encounter: Payer: Self-pay | Admitting: Genetic Counselor

## 2014-07-29 ENCOUNTER — Telehealth: Payer: Self-pay | Admitting: Cardiovascular Disease

## 2014-07-29 NOTE — Telephone Encounter (Signed)
PT HAVING  GUM SURGERY  AUG  18 NEEDS  TO HOLD   XARELTO  MAY  PT   PROCEED WITH PROCEDURE  WILL FORWARD M ESSAGE  TO   DR Johnsie Cancel  .Adonis Housekeeper

## 2014-07-29 NOTE — Telephone Encounter (Signed)
New message     Request for surgical clearance:  1. What type of surgery is being performed? Gum surgery, making tissue thicker on front 2 lower teeth  2. When is this surgery scheduled? August 18  3. Are there any medications that need to be held prior to surgery and how long? Xarelto; needs to know how long Dr. Johnsie Cancel would be okay holding   4. Name of physician performing surgery? Dr. Royston Cowper  5. What is your office phone and fax number? [facility] [phone] Fax [phone]  Please call to discuss

## 2014-07-30 NOTE — Telephone Encounter (Signed)
Ok to hold xarelto 2 days before surgery

## 2014-07-31 NOTE — Telephone Encounter (Signed)
Follow UP  RN from Dr. Mellissa Kohut office following up on xarelto hold for surgery. Please call back and discuss.

## 2014-07-31 NOTE — Telephone Encounter (Addendum)
Informed Debbie that patient can hold Xarelto for 2 days per Dr. Johnsie Cancel.  Clearance faxed to Dr. Mellissa Kohut office.

## 2014-09-24 ENCOUNTER — Encounter: Payer: Medicare Other | Admitting: Emergency Medicine

## 2014-10-01 ENCOUNTER — Other Ambulatory Visit: Payer: Self-pay | Admitting: Cardiovascular Disease

## 2014-10-07 ENCOUNTER — Encounter: Payer: Self-pay | Admitting: *Deleted

## 2014-10-07 NOTE — Progress Notes (Signed)
Patient ID: [name], female   DOB: 11-15-1950, 64 y.o.   MRN: [id number] [name] is seen today for F/U of post-phlebitic syndrome, coumadin Rx and abnormal ECG. She is an ovarian cancer survivor out 5 years now. Her initial extensive DVT was in the setting of ovarian CA. She has had multiple F/U duplex showing persistant clot and recanalizatoin. With her weight we have elected to keep her on anticoagulation  With her meds and compressoin hose her edema is stable. She has had an abnromal ECG in the past with poor R wave progression but normal EF and I suspect it is from lead placement and body habitus No change on ECG today   Long discussion about bariatric surgery. She has had and open hysterectomy and mesh repair of hernia but would benefit from such surgery. She has now been Dx with servere sleep apnea and on CPAP Not interested in surgery Had injection of right knee with Deveshwar and improved but will eventually need bilateral knee replacements   Changed to xarelto for last visit   Needs Hb/Hct PLT and BMET checked twice/ year by Dr Everlene Farrier  09/18/13  Hct 40.5  PLT 190 Cr .9   Trying to do some water aerobics  Wearing CPAP    ROS: Denies fever, malais, weight loss, blurry vision, decreased visual acuity, cough, sputum, SOB, hemoptysis, pleuritic pain, palpitaitons, heartburn, abdominal pain, melena, lower extremity edema, claudication, or rash.  All other systems reviewed and negative  General: Affect appropriate Obese white female  HEENT: normal Neck supple with no adenopathy JVP normal no bruits no thyromegaly Lungs clear with no wheezing and good diaphragmatic motion Heart:  S1/S2 no murmur, no rub, gallop or click PMI normal Abdomen: benighn, BS positve, no tenderness, no AAA no bruit.  No HSM or HJR Distal pulses intact with no bruits Plus one bilateral edema Neuro non-focal Skin warm and dry No muscular weakness   Current Outpatient Prescriptions  Medication Sig  Dispense Refill  . atorvastatin (LIPITOR) 40 MG tablet TAKE 1 TABLET BY MOUTH EVERY DAY 90 tablet 3  . calcium carbonate (OS-CAL) 600 MG TABS Take 600 mg by mouth 2 (two) times daily with a meal.      . cetirizine (ZYRTEC) 10 MG tablet Take 10 mg by mouth every morning.     . EUFLEXXA 20 MG/2ML SOSY Inject 20 mg as directed every 6 (six) months.     . ferrous sulfate 325 (65 FE) MG tablet Take 325 mg by mouth every evening.     . fish oil-omega-3 fatty acids 1000 MG capsule Take 1-2 g by mouth 2 (two) times daily.     Marland Kitchen lisinopril-hydrochlorothiazide (PRINZIDE,ZESTORETIC) 20-25 MG per tablet TAKE 1 TABLET BY MOUTH DAILY 90 tablet 3  . Multiple Vitamin (MULTIVITAMIN) tablet Take 1 tablet by mouth every morning.     . niacin 500 MG tablet Take 500 mg by mouth every evening.     . rivaroxaban (XARELTO) 20 MG TABS tablet Take 20 mg by mouth daily with supper.    . vitamin E 400 UNIT capsule Take 400 Units by mouth every morning.      No current facility-administered medications for this visit.    Allergies  Valtrex; Benoxinate base; Sulfonamide derivatives; and Ivp dye  Electrocardiogram:  SR rate 82  Nonspecific ST changes   10/08/14  SR rate 85  Low voltage   Assessment and Plan DVT/Postphebitic:  Continue NOAC samples xarelto given Chol;  On statin  HTN:  Well controlled.  Continue current medications and low sodium Dash type diet.   Abnormal ECG nonspecific ST/T wave changes f/u ECG in a year OSA:  Compliant with CPAP consider echo in a year to assess RV function and PA pressure  F/U with me in a year   [employer]

## 2014-10-08 ENCOUNTER — Encounter: Payer: Self-pay | Admitting: Cardiovascular Disease

## 2014-10-08 ENCOUNTER — Ambulatory Visit (INDEPENDENT_AMBULATORY_CARE_PROVIDER_SITE_OTHER): Payer: Medicare Other | Admitting: Cardiovascular Disease

## 2014-10-08 VITALS — BP 130/70 | HR 85 | Ht 67.0 in | Wt 315.4 lb

## 2014-10-08 DIAGNOSIS — I10 Essential (primary) hypertension: Secondary | ICD-10-CM | POA: Diagnosis not present

## 2014-10-08 NOTE — Patient Instructions (Signed)
Medication Instructions:  Your physician recommends that you continue on your current medications as directed. Please refer to the Current Medication list given to you today.   Labwork: NONE Testing/Procedures: NONE  Follow-Up: Your physician wants you to follow-up in: YEAR WITH  DR NISHAN  You will receive a reminder letter in the mail two months in advance. If you don't receive a letter, please call our office to schedule the follow-up appointment.  Any Other Special Instructions Will Be Listed Below (If Applicable).   

## 2014-10-10 ENCOUNTER — Ambulatory Visit (INDEPENDENT_AMBULATORY_CARE_PROVIDER_SITE_OTHER): Payer: Medicare Other | Admitting: Emergency Medicine

## 2014-10-10 ENCOUNTER — Encounter: Payer: Self-pay | Admitting: Emergency Medicine

## 2014-10-10 VITALS — BP 123/75 | HR 85 | Temp 98.6°F | Resp 18 | Ht 67.0 in | Wt 313.0 lb

## 2014-10-10 DIAGNOSIS — Z0000 Encounter for general adult medical examination without abnormal findings: Secondary | ICD-10-CM | POA: Diagnosis not present

## 2014-10-10 DIAGNOSIS — I10 Essential (primary) hypertension: Secondary | ICD-10-CM

## 2014-10-10 DIAGNOSIS — I82402 Acute embolism and thrombosis of unspecified deep veins of left lower extremity: Secondary | ICD-10-CM

## 2014-10-10 DIAGNOSIS — Z1159 Encounter for screening for other viral diseases: Secondary | ICD-10-CM

## 2014-10-10 DIAGNOSIS — Z23 Encounter for immunization: Secondary | ICD-10-CM | POA: Diagnosis not present

## 2014-10-10 DIAGNOSIS — E785 Hyperlipidemia, unspecified: Secondary | ICD-10-CM

## 2014-10-10 LAB — CBC WITH DIFFERENTIAL/PLATELET
BASOS PCT: 1 % (ref 0–1)
Basophils Absolute: 0 10*3/uL (ref 0.0–0.1)
EOS ABS: 0 10*3/uL (ref 0.0–0.7)
EOS PCT: 1 % (ref 0–5)
HCT: 40 % (ref 36.0–46.0)
Hemoglobin: 13.8 g/dL (ref 12.0–15.0)
Lymphocytes Relative: 39 % (ref 12–46)
Lymphs Abs: 1.5 10*3/uL (ref 0.7–4.0)
MCH: 29.9 pg (ref 26.0–34.0)
MCHC: 34.5 g/dL (ref 30.0–36.0)
MCV: 86.8 fL (ref 78.0–100.0)
MONO ABS: 0.4 10*3/uL (ref 0.1–1.0)
MONOS PCT: 10 % (ref 3–12)
MPV: 9.9 fL (ref 8.6–12.4)
Neutro Abs: 1.9 10*3/uL (ref 1.7–7.7)
Neutrophils Relative %: 49 % (ref 43–77)
Platelets: 168 10*3/uL (ref 150–400)
RBC: 4.61 MIL/uL (ref 3.87–5.11)
RDW: 14.5 % (ref 11.5–15.5)
WBC: 3.8 10*3/uL — ABNORMAL LOW (ref 4.0–10.5)

## 2014-10-10 LAB — COMPLETE METABOLIC PANEL WITH GFR
ALBUMIN: 4.4 g/dL (ref 3.6–5.1)
ALK PHOS: 93 U/L (ref 33–130)
ALT: 30 U/L — AB (ref 6–29)
AST: 24 U/L (ref 10–35)
BILIRUBIN TOTAL: 0.7 mg/dL (ref 0.2–1.2)
BUN: 21 mg/dL (ref 7–25)
CALCIUM: 10 mg/dL (ref 8.6–10.4)
CO2: 26 mmol/L (ref 20–31)
CREATININE: 0.86 mg/dL (ref 0.50–0.99)
Chloride: 102 mmol/L (ref 98–110)
GFR, EST AFRICAN AMERICAN: 81 mL/min (ref >=60)
GFR, Est Non African American: 71 mL/min (ref >=60)
Glucose, Bld: 94 mg/dL (ref 65–99)
Potassium: 3.9 mmol/L (ref 3.5–5.3)
Sodium: 140 mmol/L (ref 135–146)
TOTAL PROTEIN: 6.9 g/dL (ref 6.1–8.1)

## 2014-10-10 LAB — POCT URINALYSIS DIP (MANUAL ENTRY)
Bilirubin, UA: NEGATIVE
GLUCOSE UA: NEGATIVE
Ketones, POC UA: NEGATIVE
NITRITE UA: NEGATIVE
Protein Ur, POC: NEGATIVE
RBC UA: NEGATIVE
Spec Grav, UA: 1.02
UROBILINOGEN UA: 0.2
pH, UA: 6

## 2014-10-10 LAB — TSH: TSH: 2.008 u[IU]/mL (ref 0.350–4.500)

## 2014-10-10 LAB — LIPID PANEL
CHOLESTEROL: 190 mg/dL (ref 125–200)
HDL: 46 mg/dL (ref >=46)
LDL Cholesterol: 90 mg/dL (ref <130)
Total CHOL/HDL Ratio: 4.1 Ratio (ref <=5.0)
Triglycerides: 269 mg/dL — ABNORMAL HIGH (ref <150)
VLDL: 54 mg/dL — AB (ref <30)

## 2014-10-10 LAB — HEPATITIS C ANTIBODY: HCV AB: NEGATIVE

## 2014-10-10 NOTE — Progress Notes (Signed)
This chart was scribed for Kathleen Queen, MD by Moises Blood, Medical Scribe. This patient was seen in Room 21 and the patient's care was started 9:28 AM.  Chief Complaint:  Chief Complaint  Patient presents with  . Annual Exam    HPI: [name] is a 64 y.o. female who reports to [name] today for physical exam.  She had an ekg done 2 days ago at cardiology, Dr. Johnsie Cancel.    She's still struggling walking and being on her feet. But, now she's started swimming and aquatic walking at [facility]. She sees rheumatologist, Dr. Estanislado Pandy. She has some swelling in her right ankle.   Dental She had dental surgery recently and had to eat soft foods. She's lost 10 lbs.   Cancer Screening Her last colonoscopy was earlier this year.  OB GYN seen annually by Dr. Sabra Heck.  She's had dense breast in mammogram.   She had hysterectomy with bilateral salpingoophorectomy due to ovarian cancer back in 2005.   Eye She sees eye doctor 4 times a year.   Immunizations She was recommended for flu shot and received one today.  She received her Prevnar 13 last year.    Past Medical History  Diagnosis Date  . HTN (hypertension)   . Hyperlipidemia   . DVT (deep vein thrombosis) in pregnancy     LEFT LEG  . Bronchitis   . 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 Basal cell carcinoma of nose   . Shingles   . OSA (obstructive sleep apnea)     hypoventilation, on CPAP  . Femoral DVT (deep venous thrombosis) 2008    -"wears compression hose all times" left leg  . Diverticulitis     past history  . Anemia     oral iron occ.   Past Surgical History  Procedure Laterality Date  . Total abdominal hysterectomy w/ bilateral salpingoophorectomy  2005    Stage IA ovarian cancer  . Basal cell carcinoma excision      resection with plastic surgery reconstrustion  . Cataract surgery Bilateral [id number], 2005    with bilateral lens replacements  . Bilateral breast  mass  1970    benigned, left breast  . Hernia repair  7425    umbilical repair with mesh   . Colonoscopy with propofol N/A 05/21/2014    Procedure: COLONOSCOPY WITH PROPOFOL;  Surgeon: Juanita Craver, MD;  Location: [facility];  Service: Endoscopy;  Laterality: N/A;   Social History   Social History  . Marital Status: Single    Spouse Name: N/A  . Number of Children: 0  . Years of Education: masters   Occupational History  . 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 Smoking status: Never Smoker   . Smokeless tobacco: Never Used     Comment: only for 6 months in the 70s  . Alcohol Use: 0.0 oz/week    0 Standard drinks or equivalent per week     Comment: rare, once per month  . Drug Use: No  . Sexual Activity: No   Other Topics Concern  . 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 PSH showed an AHI of 120!!!! titrated to 8 cm watr cPAP , residual AHI of 1 and downlaod was done  01-23-11 .  excellent compliance - 7 hours  and low leak,  nasal pillow mask.    BMI is considered morbidly obese.  discussed low carb  diet - weight watchers and restricted exercise. , aquatic .    Patient cannot exercise due to soreness in proximal muscles.and  would like to add coenzyme q 10 and carnitine . She needs a medical weight loss regimen - bariatric surgery?       FSS 48, Epworth 4 again ,  CMS compliant  residual AHI 1.1 and average use 6.48  hours , no naps    Patient is single and lives alone.   Patient does not have any children.   Patient is retired.   Patient has a Master's degree.   Patient is left-handed.   Patient drinks tea occasionally.             Family History  Problem Relation Age of Onset  . Alzheimer's disease Mother   . Hypertension Mother   . Alzheimer's disease Father   . Hypertension Father   . Colon cancer Father     34  . Hypertension Sister   . Hypertension Brother     Allergies  Allergen Reactions  . Valtrex [Valacyclovir Hcl] Swelling and Rash    Swelling of face and arm, rash  . Benoxinate Base     Turns eyes blood red/burning. --in eyedrops--  . Sulfonamide Derivatives   . Ivp Dye [Iodinated Diagnostic Agents] Rash    Rash on back   Prior to Admission medications   Medication Sig Start Date End Date Taking? Authorizing Provider  atorvastatin (LIPITOR) 40 MG tablet TAKE 1 TABLET BY MOUTH EVERY DAY 10/01/14   Josue Hector, MD  calcium carbonate (OS-CAL) 600 MG TABS Take 600 mg by mouth 2 (two) times daily with a meal.      Historical Provider, MD  cetirizine (ZYRTEC) 10 MG tablet Take 10 mg by mouth every morning.     Historical Provider, MD  EUFLEXXA 20 MG/2ML SOSY Inject 20 mg as directed every 6 (six) months.  06/07/14   Historical Provider, MD  ferrous sulfate 325 (65 FE) MG tablet Take 325 mg by mouth every evening.     Historical Provider, MD  fish oil-omega-3 fatty acids 1000 MG capsule Take 1-2 g by mouth 2 (two) times daily.     Historical Provider, MD  lisinopril-hydrochlorothiazide (PRINZIDE,ZESTORETIC) 20-25 MG per tablet TAKE 1 TABLET BY MOUTH DAILY 10/01/14   Josue Hector, MD  Multiple Vitamin (MULTIVITAMIN) tablet Take 1 tablet by mouth every morning.     Historical Provider, MD  niacin 500 MG tablet Take 500 mg by mouth every evening.     Historical Provider, MD  rivaroxaban (XARELTO) 20 MG TABS tablet Take 20 mg by mouth daily with supper.    Historical Provider, MD  vitamin E 400 UNIT capsule Take 400 Units by mouth every morning.     Historical Provider, MD     ROS:  Constitutional: negative for chills, fever, night sweats, weight changes, or fatigue  HEENT: negative for vision changes, hearing loss, congestion, rhinorrhea, ST, epistaxis, or sinus pressure Cardiovascular: negative for chest pain or palpitations Respiratory: negative for hemoptysis, wheezing, shortness of breath, or cough Abdominal: negative for abdominal  pain, nausea, vomiting, diarrhea, or constipation Dermatological: negative for rash Neurologic: negative for headache, dizziness, or syncope All other systems reviewed and are otherwise negative with the exception to those above and in the HPI.  PHYSICAL EXAM: Filed Vitals:   10/10/14 0913  BP:  123/75  Pulse: 85  Temp: 98.6 F (37 C)  Resp: 18   Body mass index is 49.01 kg/(m^2).   General: Alert, no acute distress HEENT:  Normocephalic, atraumatic, oropharynx patent. Eye: [facility], [facility]; bilateral cataracts surgery Cardiovascular:  Regular rate and rhythm, no rubs murmurs or gallops.  No Carotid bruits, radial pulse intact. No pedal edema.  Respiratory: Clear to auscultation bilaterally.  No wheezes, rales, or rhonchi.  No cyanosis, no use of accessory musculature Abdominal: No organomegaly, abdomen is soft and non-tender, positive bowel sounds. No masses.; large midline scar Musculoskeletal: Gait intact. No edema, tenderness; surgical support stocking on left leg, 1+ edema right lower leg Skin: No rashes. Neurologic: Facial musculature symmetric. Psychiatric: Patient acts appropriately throughout our interaction.  Lymphatic: No cervical or submandibular lymphadenopathy Genitourinary/Anorectal: No acute findings   LABS: Results for orders placed or performed in visit on 07/09/14  CA 125  Result Value Ref Range   CA 125 5 <35 U/mL     EKG/XRAY:   Primary read interpreted by Dr. Everlene Farrier at [facility].   ASSESSMENT/PLAN: Overall she is doing well. She needs to continue to work on weight and diet and exercise. No changes made in medications today immunizations were updated.  By signing my name below, I, Moises Blood, attest that this documentation has been prepared under the direction and in the presence of Kathleen Queen, MD. Electronically Signed: Moises Blood, [place]. 10/10/2014 , 9:28 AM .    Johney Maine sideeffects, risk and benefits, and alternatives of medications d/w patient.  Patient is aware that all medications have potential sideeffects and we are unable to predict every sideeffect or drug-drug interaction that may occur.  Kathleen Queen MD 10/10/2014 9:28 AM

## 2014-10-15 ENCOUNTER — Encounter: Payer: Self-pay | Admitting: Emergency Medicine

## 2014-10-30 ENCOUNTER — Other Ambulatory Visit: Payer: Self-pay | Admitting: Cardiovascular Disease

## 2015-07-23 ENCOUNTER — Ambulatory Visit (INDEPENDENT_AMBULATORY_CARE_PROVIDER_SITE_OTHER): Payer: Medicare Other | Admitting: Neurology

## 2015-07-23 ENCOUNTER — Encounter: Payer: Self-pay | Admitting: Neurology

## 2015-07-23 VITALS — BP 140/82 | HR 86 | Resp 20 | Ht 67.0 in | Wt 314.0 lb

## 2015-07-23 DIAGNOSIS — Z9989 Dependence on other enabling machines and devices: Principal | ICD-10-CM

## 2015-07-23 DIAGNOSIS — G4733 Obstructive sleep apnea (adult) (pediatric): Secondary | ICD-10-CM | POA: Diagnosis not present

## 2015-07-23 DIAGNOSIS — E662 Morbid (severe) obesity with alveolar hypoventilation: Secondary | ICD-10-CM | POA: Diagnosis not present

## 2015-07-23 NOTE — Progress Notes (Signed)
[facility]  Provider:  Dr Narciso Stoutenburg Referring Provider: Darlyne Russian, MD Primary Care Physician:  No primary care provider on file.  Chief Complaint  Patient presents with  . Follow-up    cpap, dreaming on cpap    HPI:  [name] is a 65 y.o. female here as a referral from Dr. Everlene Farrier for her yearly follow up visit, the patent has OSA and is on CPAP therapy. Review of Systems:Out of a complete 14 system review, the patient complains of only the following symptoms, and all other reviewed systems are negative.  Her FSS is 11 points and Epworth is 1 points, good results. No nocturia , dry nose in AM.  Her CPAP download: 07-03-13; 7 hours 17 minutes nightly use over the 90 days download period. 98% compliance . AHI 0.8,  8 cm water pressure setting and 2 cm EPR. Very good result. BMI remains elevated , her main risk factor is her obesity.  Interval history on 07-22-14. [name] brought her download from her CPAP machine was referred her today she has used the CPAP 99% over the last 90 days and 99% over 4 hours her average user time is 7 hours and 31 minutes the machine is set at 8 cm water was 2 cm EPR residual AHI is 0.7 compliance is excellent the needs to be no adjustments made be also obtained an Epworth sleepiness score at 3 points the fatigue severity score at 13 points both in good range. Geriatric depression score was endorsed at one single point. No adjustments need to be made at this point patient will get her refill and supplies through her advanced home care provider.  Lost another 6 pounds, joined the aquatic center. 07-22-14   Last years visit note : 2014 . [name] ,a 65 year old, [name], right-handed female patient of Dr.Daub's is followed here for a sleep apnea.  The patient underwent a split study on 9-21 2012, after complaining of excessive daytime fatigue, sleepiness, witnessed apneas and fragmented sleep at night she also was easily short of  breath. The patient at the past medical history of morbid obesity, hypertension and hyperlipidemia. At the time of her sleep study her BMI was 46.7 and neck circumference 17 inches her blood pressure was also slightly elevated 141/81 mm Hg. The patient had been on Coumadin for DVT at the time. She tested positive for an AHI of 120 and had to be immediately titrated to a pressure of 9 cm water was reduced for AHI to 0.0 he also was able to enter REM sleep. There was borderline hypoxia noted with hypoventilation and periodic limb movements were also very frequent at night. In February bry 2013 she followed up  90 days download- and was using CPAP nightly , had not gained weight and a download revealed a residual AHI on CPAP of 8 cm water of 1.6 . She has meanwhile tried to further was weight especially since he also has had pain and pain due to arthritis. A download from 4-17 -2013 showed an AHI of 1.1, average daily usage of 6 hours 56 minutes set pressure of 8 cm water with a 2 cm EPR. Today her Epworth sleepiness score is 4 points her fatigue score 25 and the patient has continued to use compliantly her CPAP. We were unable to obtain a download in the office today. The patient reports her normal sleep time is from 12:30 midnight to 7:30 AM, she normally falls asleep within 5 minutes. She has  no longer nocturia but had nocturia before starting CPAP. She averages about 7 hours of sleep at night. When she wakes up she feels restored, she still takes one nap daily but after lunch about the duration of 30-45 minutes. She reports laughingly that her whole family naps !The nap is perceived as refreshing. Does not recall any dreams or nightmares or any dreams activity sleepwalking or sleep talking. She does not snore through the machine. She retired since last visit and is free to nap now. She is losing weight, about 6 pounds a month.    Interval history from 07/23/2015. [name] is here today for routine follow-up  she reports that over the last 1 or 2 months she has been dreaming more some of his dreams of visit and have a more threatening content but they're not strictly nightmares. Usually she is running from something, fleeing or defending herself. She has no indication that she acted out on dreams. She had originally undergone a sleep study on September 2012 and was placed on CPAP 9 cm water. She had an AHI at that time of 120. All last summer she has lost weight when she entered the aquatic center. She continues to do this 3 times a week. I am waiting for an in office download.  Patient 100% compliance for days of use and for 4 hours or more of nightly use, with an average of 7 hours and 50 minutes. CPAP is set at 8 cm water with 27 m EPR residual AHI is 0.7. This is an excellent result she also doesn't have major air leaks. No adjustments to CPAP necessary- I will see her in 72 month     Social History   Social History  . Marital Status: Single    Spouse Name: N/A  . Number of Children: 0  . Years of Education: masters   Occupational History  . 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 Smoking status: Never Smoker   . Smokeless tobacco: Never Used     Comment: only for 6 months in the 70s  . Alcohol Use: 0.0 oz/week    0 Standard drinks or equivalent per week     Comment: rare, once per month  . Drug Use: No  . Sexual Activity: No   Other Topics Concern  . 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 PSH showed an AHI of 120!!!! titrated to 8 cm watr cPAP , residual AHI of 1 and downlaod was done  01-23-11 .  excellent compliance - 7 hours and low leak,  nasal pillow mask.    BMI is considered morbidly obese.  discussed low carb  diet - weight watchers and restricted exercise. , aquatic .    Patient cannot exercise due to soreness in proximal muscles.and  would like to add coenzyme q 10 and  carnitine . She needs a medical weight loss regimen - bariatric surgery?       FSS 48, Epworth 4 again ,  CMS compliant  residual AHI 1.1 and average use 6.48  hours , no naps    Patient is single and lives alone.   Patient does not have any children.   Patient is retired.   Patient has a Master's degree.   Patient is left-handed.   Patient drinks tea occasionally.  Family History  Problem Relation Age of Onset  . Alzheimer's disease Mother   . Hypertension Mother   . Alzheimer's disease Father   . Hypertension Father   . Colon cancer Father     37  . Hypertension Sister   . Hypertension Brother     Past Medical History  Diagnosis Date  . HTN (hypertension)   . Hyperlipidemia   . DVT (deep vein thrombosis) in pregnancy     LEFT LEG  . Bronchitis   . Cancer of ovary ([place]) 2005    Stage 1A, BRCA 1/2 neg 6/14  . Basal cell carcinoma of nose   . Shingles   . OSA (obstructive sleep apnea)     hypoventilation, on CPAP  . Femoral DVT (deep venous thrombosis) ([place]) 2008    -"wears compression hose all times" left leg  . Diverticulitis     past history  . Anemia     oral iron occ.    Past Surgical History  Procedure Laterality Date  . Total abdominal hysterectomy w/ bilateral salpingoophorectomy  2005    Stage IA ovarian cancer  . Basal cell carcinoma excision      resection with plastic surgery reconstrustion  . Cataract surgery Bilateral [id number], 2005    with bilateral lens replacements  . Bilateral breast mass  1970    benigned, left breast  . Hernia repair  1610    umbilical repair with mesh   . Colonoscopy with propofol N/A 05/21/2014    Procedure: COLONOSCOPY WITH PROPOFOL;  Surgeon: Juanita Craver, MD;  Location: [facility];  Service: Endoscopy;  Laterality: N/A;    Current Outpatient Prescriptions  Medication Sig Dispense Refill  . atorvastatin (LIPITOR) 40 MG tablet TAKE 1 TABLET BY MOUTH EVERY DAY 90 tablet 3  . calcium carbonate  (OS-CAL) 600 MG TABS Take 600 mg by mouth 2 (two) times daily with a meal.      . cetirizine (ZYRTEC) 10 MG tablet Take 10 mg by mouth every morning.     . EUFLEXXA 20 MG/2ML SOSY Inject 20 mg as directed every 6 (six) months.     . ferrous sulfate 325 (65 FE) MG tablet Take 325 mg by mouth every evening.     . fish oil-omega-3 fatty acids 1000 MG capsule Take 1-2 g by mouth 2 (two) times daily.     Marland Kitchen lisinopril-hydrochlorothiazide (PRINZIDE,ZESTORETIC) 20-25 MG per tablet TAKE 1 TABLET BY MOUTH DAILY 90 tablet 3  . Multiple Vitamin (MULTIVITAMIN) tablet Take 1 tablet by mouth every morning.     . niacin 500 MG tablet Take 500 mg by mouth every evening.     . rivaroxaban (XARELTO) 20 MG TABS tablet Take 1 tablet (20 mg total) by mouth daily. 90 tablet 3  . vitamin E 400 UNIT capsule Take 400 Units by mouth every morning.      No current facility-administered medications for this visit.    Allergies as of 07/23/2015 - Review Complete 07/23/2015  Allergen Reaction Noted  . Valtrex [valacyclovir hcl] Swelling and Rash 10/20/2011  . Benoxinate base  12/04/2010  . Sulfonamide derivatives  07/18/2008  . Ivp dye [iodinated diagnostic agents] Rash 12/04/2010    Vitals: BP 140/82 mmHg  Pulse 86  Resp 20  Ht _0  (1.702 m)  Wt 314 lb (142.429 kg)  BMI 49.17 kg/m2 Last Weight:  Wt Readings from Last 1 Encounters:  07/23/15 314 lb (142.429 kg)   Last Height:   Ht Readings from Last  1 Encounters:  07/23/15 _0  (1.702 m)     Physical exam:  General: The patient is awake, alert and appears not in acute distress. The patient is well groomed. Head: Normocephalic, atraumatic. Neck is supple. Mallampati 4 ,  neck circumference: 18 inches  - no retrognathia.  No TMJ .Short, thick neck  Cardiovascular:  Regular rate and rhythm, without  murmurs or carotid bruit, and without distended neck veins. Respiratory: Lungs are clear to auscultation. Patient has restricted deep breathing.  Skin:  ankle  Edema, this patient has unusual finger nail deformities- spooned , dimpled.  Trunk: BMI is morbidly obese , has normal posture.  Neurologic exam : The patient is awake and alert, oriented to place and time.  Memory subjective  described as intact. There is a normal attention span & concentration ability.  Speech is fluent without dysarthria, dysphonia or aphasia. Mood and affect are appropriate.  Cranial nerves: Pupils are equal and briskly reactive to light. Funduscopic exam without  evidence of pallor or edema. Extraocular movements  in vertical and horizontal planes intact and without nystagmus. Visual fields by finger perimetry are intact. Hearing to finger rub intact.  Facial sensation intact to fine touch. Facial motor strength is symmetric and tongue and uvula move midline. Motor exam:  ROM limitation of the shoulders, exercise aggravated the pain.  Gait and station: Patient walks without assistive device . Wide based.  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 Severe AHI of 120 in the patient baseline study completely corrected of only 8 cm of water pressure and no additional oxygen was prescribed the residual AHI was 0.7,  the average daily usage 7 hours and 31 minutes 2016- Continue using at the current pressure. 8 cm water . !100%compliance, 7 hours nightly average, AHI less than 1.   2 )Very high body mass index. Is encouraged to hydrate well, and to keep her vitamin K intake in mind , as she continues to be on warfarin. She is now swimming more regularly .   Erikka Follmer, MD   Cc; Dr. Delman Cheadle

## 2015-08-28 LAB — HM MAMMOGRAPHY: HM MAMMO: NORMAL (ref 0–4)

## 2015-09-16 ENCOUNTER — Encounter: Payer: Self-pay | Admitting: Obstetrics & Gynecology

## 2015-09-23 ENCOUNTER — Other Ambulatory Visit: Payer: Self-pay | Admitting: Cardiovascular Disease

## 2015-09-30 ENCOUNTER — Encounter: Payer: Self-pay | Admitting: Cardiovascular Disease

## 2015-10-07 NOTE — Progress Notes (Signed)
Patient ID: [name], female   DOB: 1950-01-24, 65 y.o.   MRN: [id number] [name] is seen today for F/U of post-phlebitic syndrome, anticoagulation  and abnormal ECG. She is an ovarian cancer survivor out 5 years now. Her initial extensive DVT was in the setting of ovarian CA. She has had multiple F/U duplex showing persistant clot and recanalizatoin. With her weight we have elected to keep her on anticoagulation  With her meds and compressoin hose her edema is stable. She has had an abnromal ECG in the past with poor R wave progression but normal EF and I suspect it is from lead placement and body habitus No change on ECG today   Long discussion about bariatric surgery. She has had and open hysterectomy and mesh repair of hernia but would benefit from such surgery. She has now been Dx with servere sleep apnea and on CPAP Not interested in surgery Had injection of right knee with [name] and improved but will eventually need bilateral knee replacements   Changed to xarelto 2016   Needs Hb/Hct PLT and BMET checked twice/ year by [name]  Trying to do some water aerobics  Wearing CPAP  ROS: Denies fever, malais, weight loss, blurry vision, decreased visual acuity, cough, sputum, SOB, hemoptysis, pleuritic pain, palpitaitons, heartburn, abdominal pain, melena, lower extremity edema, claudication, or rash.  All other systems reviewed and negative  General: Affect appropriate Obese white female  HEENT: normal Neck supple with no adenopathy JVP normal no bruits no thyromegaly Lungs clear with no wheezing and good diaphragmatic motion Heart:  S1/S2 no murmur, no rub, gallop or click PMI normal Abdomen: benighn, BS positve, no tenderness, no AAA no bruit.  No HSM or HJR Distal pulses intact with no bruits Plus one bilateral edema Neuro non-focal Skin warm and dry No muscular weakness   Current Outpatient Prescriptions  Medication Sig Dispense Refill  . atorvastatin (LIPITOR) 40 MG  tablet TAKE 1 TABLET BY MOUTH EVERY DAY 90 tablet 0  . calcium carbonate (OS-CAL) 600 MG TABS Take 600 mg by mouth 2 (two) times daily with a meal.      . cetirizine (ZYRTEC) 10 MG tablet Take 10 mg by mouth every morning.     . EUFLEXXA 20 MG/2ML SOSY Inject 20 mg as directed every 6 (six) months.     . ferrous sulfate 325 (65 FE) MG tablet Take 325 mg by mouth every evening.     . fish oil-omega-3 fatty acids 1000 MG capsule Take 1-2 g by mouth 2 (two) times daily.     Marland Kitchen lisinopril-hydrochlorothiazide (PRINZIDE,ZESTORETIC) 20-25 MG tablet TAKE 1 TABLET BY MOUTH DAILY 90 tablet 0  . Multiple Vitamin (MULTIVITAMIN) tablet Take 1 tablet by mouth every morning.     . niacin 500 MG tablet Take 500 mg by mouth every evening.     . rivaroxaban (XARELTO) 20 MG TABS tablet Take 1 tablet (20 mg total) by mouth daily. 90 tablet 3  . vitamin E 400 UNIT capsule Take 400 Units by mouth every morning.      No current facility-administered medications for this visit.     Allergies  Valtrex [valacyclovir hcl]; Benoxinate base; Sulfonamide derivatives; and Ivp dye [iodinated diagnostic agents]  Electrocardiogram:  SR rate 82  Nonspecific ST changes   10/08/14  SR rate 85  Low voltage  10/13/15  SR rate 91 low voltage nonspecific ST changes    Assessment and Plan DVT/Postphebitic:  Continue NOAC samples xarelto given will get  labs including Renal function with [name] at [facility] Chol;  On statin   HTN:  Well controlled.  Continue current medications and low sodium Dash type diet.   Abnormal ECG nonspecific ST/T wave changes f/u ECG in a year OSA:  Compliant with CPAP consider echo in a year to assess RV function and PA pressure  F/U with me in a year   [employer]

## 2015-10-10 ENCOUNTER — Encounter: Payer: Self-pay | Admitting: Obstetrics & Gynecology

## 2015-10-10 ENCOUNTER — Ambulatory Visit (INDEPENDENT_AMBULATORY_CARE_PROVIDER_SITE_OTHER): Payer: Medicare Other | Admitting: Obstetrics & Gynecology

## 2015-10-10 VITALS — BP 130/70 | HR 100 | Resp 18 | Ht 66.0 in | Wt 317.0 lb

## 2015-10-10 DIAGNOSIS — C562 Malignant neoplasm of left ovary: Secondary | ICD-10-CM

## 2015-10-10 DIAGNOSIS — Z01419 Encounter for gynecological examination (general) (routine) without abnormal findings: Secondary | ICD-10-CM | POA: Diagnosis not present

## 2015-10-10 NOTE — Progress Notes (Signed)
65 y.o. G0P0 SingleCaucasianF here for annual exam.  Reports sister was just diagnosed with breast cancer.  Pt is going to proceed with additional testing for genetics.  Does have negative BRCA 1/2 testing.    Patient's last menstrual period was 01/12/2003.          Sexually active: No.  The current method of family planning is status post hysterectomy.    Exercising: Yes.    Swim  Smoker:  no  Health Maintenance: Pap:  2005 Hx Stage IA ovarian cancer History of abnormal Pap:  yes MMG:  08/28/15 BIRADS1:Neg  Colonoscopy:  05/21/14 f/u 5 years  BMD:   07/26/12 Normal  TDaP:  07/2014  Pneumonia vaccine(s):  09/2014 & 9/15 Zostavax:   Done Hep C testing:  Done 9/16 Screening Labs: PCP   reports that she has never smoked. She has never used smokeless tobacco. She reports that she drinks alcohol. She reports that she does not use drugs.  Past Medical History:  Diagnosis Date  . Anemia    oral iron occ.  . Basal cell carcinoma of nose   . Bronchitis   . Cancer of ovary ([place]) 2005   Stage 1A, BRCA 1/2 neg 6/14  . Diverticulitis    past history  . DVT (deep vein thrombosis) in pregnancy    LEFT LEG  . 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 HTN (hypertension)   . Hyperlipidemia   . OSA (obstructive sleep apnea)    hypoventilation, on CPAP  . Shingles     Past Surgical History:  Procedure Laterality Date  . BASAL CELL CARCINOMA EXCISION     resection with plastic surgery reconstrustion  . bilateral breast mass  1970   benigned, left breast  . cataract surgery Bilateral [id number], 2005   with bilateral lens replacements  . COLONOSCOPY WITH PROPOFOL N/A 05/21/2014   Procedure: COLONOSCOPY WITH PROPOFOL;  Surgeon: Juanita Craver, MD;  Location: [facility];  Service: Endoscopy;  Laterality: N/A;  . HERNIA REPAIR  7342   umbilical repair with mesh   . TOTAL ABDOMINAL HYSTERECTOMY W/ BILATERAL SALPINGOOPHORECTOMY  2005   Stage IA ovarian cancer    Current Outpatient Prescriptions  Medication Sig Dispense Refill  . atorvastatin (LIPITOR) 40 MG tablet TAKE 1 TABLET BY MOUTH EVERY DAY 90 tablet 0  . calcium carbonate (OS-CAL) 600 MG TABS Take 600 mg by mouth 2 (two) times daily with a meal.      . cetirizine (ZYRTEC) 10 MG tablet Take 10 mg by mouth every morning.     . EUFLEXXA 20 MG/2ML SOSY Inject 20 mg as directed every 6 (six) months.     . ferrous sulfate 325 (65 FE) MG tablet Take 325 mg by mouth every evening.     . fish oil-omega-3 fatty acids 1000 MG capsule Take 1-2 g by mouth 2 (two) times daily.     Marland Kitchen lisinopril-hydrochlorothiazide (PRINZIDE,ZESTORETIC) 20-25 MG tablet TAKE 1 TABLET BY MOUTH DAILY 90 tablet 0  . Multiple Vitamin (MULTIVITAMIN) tablet Take 1 tablet by mouth every morning.     . niacin 500 MG tablet Take 500 mg by mouth every evening.     . rivaroxaban (XARELTO) 20 MG TABS tablet Take 1 tablet (20 mg total) by mouth daily. 90 tablet 3  . vitamin E 400 UNIT capsule Take 400 Units by mouth every morning.      No current facility-administered medications for this  visit.     Family History  Problem Relation Age of Onset  . Alzheimer's disease Mother   . Hypertension Mother   . Alzheimer's disease Father   . Hypertension Father   . Colon cancer Father     64  . Hypertension Sister   . Breast cancer Sister   . Hypertension Brother     ROS:  Pertinent items are noted in HPI.  Otherwise, a comprehensive ROS was negative.  Exam:   BP 130/70 (BP Location: Right Arm, Patient Position: Sitting, Cuff Size: Large)   Pulse 100   Resp 18   Ht '5\' 6"'  (1.676 m)   Wt (!) 317 lb (143.8 kg)   LMP 01/12/2003   BMI 51.17 kg/m   Weight change: '@WEIGHTCHANGE' @ Height:   Height: '5\' 6"'  (167.6 cm)  Ht Readings from Last 3 Encounters:  10/10/15 '5\' 6"'  (1.676 m)  07/23/15 '5\' 7"'  (1.702 m)  10/10/14 '5\' 7"'  (1.702 m)    General appearance: alert, cooperative and appears stated age Head:  Normocephalic, without obvious abnormality, atraumatic Neck: no adenopathy, supple, symmetrical, trachea midline and thyroid normal to inspection and palpation Lungs: clear to auscultation bilaterally Breasts: normal appearance, no masses or tenderness, persistent, small mass in left breast above areola at 12 o 'clock where she had prior trauma Heart: regular rate and rhythm Abdomen: soft, non-tender; bowel sounds normal; no masses,  no organomegaly Extremities: extremities normal, atraumatic, no cyanosis or edema Skin: Skin color, texture, turgor normal. No rashes or lesions Lymph nodes: Cervical, supraclavicular, and axillary nodes normal. No abnormal inguinal nodes palpated Neurologic: Grossly normal  Pelvic: External genitalia:  no lesions              Urethra:  normal appearing urethra with no masses, tenderness or lesions              Bartholins and Skenes: normal                 Vagina: normal appearing vagina with normal color and discharge, no lesions              Cervix: absent              Pap taken: No. Bimanual Exam:  Uterus:  uterus absent              Adnexa: no mass, fullness, tenderness               Rectovaginal: Confirms               Anus:  normal sphincter tone, no lesions  Chaperone was present for exam.  A:      Well Woman with normal exam H/O ovarian cancer, 1A..  S/p TAH/BSO 5/05. H/O large left LE DVT. On chronic anticoagulation, now with Xarelto Obesity Family hx of breast cancer (niece) and now sister Skin candida Persistent breast mass in left breast where pt had trauma  P: Mammogram yearly.  No pap smear. H/O TAH/BSO due to ovarian cancer. Pt is now interested in pursuing additional genetic testing.  We have discussed this in the past.  She will call for appt and knows to call if has any issues with this. Ca-125 yearly.  Ordered today. Labs/vaccines with PCP Return annually or prn

## 2015-10-11 LAB — CA 125: CA 125: 5 U/mL (ref <35)

## 2015-10-13 ENCOUNTER — Ambulatory Visit (INDEPENDENT_AMBULATORY_CARE_PROVIDER_SITE_OTHER): Payer: Medicare Other | Admitting: Cardiovascular Disease

## 2015-10-13 ENCOUNTER — Encounter: Payer: Self-pay | Admitting: Cardiovascular Disease

## 2015-10-13 VITALS — BP 142/70 | HR 95 | Ht 67.0 in | Wt 318.1 lb

## 2015-10-13 DIAGNOSIS — Z09 Encounter for follow-up examination after completed treatment for conditions other than malignant neoplasm: Secondary | ICD-10-CM | POA: Diagnosis not present

## 2015-10-13 NOTE — Patient Instructions (Addendum)

## 2015-10-15 DIAGNOSIS — E785 Hyperlipidemia, unspecified: Secondary | ICD-10-CM | POA: Insufficient documentation

## 2015-10-15 DIAGNOSIS — D649 Anemia, unspecified: Secondary | ICD-10-CM | POA: Insufficient documentation

## 2015-10-15 NOTE — Progress Notes (Signed)
Subjective:    [name] is a 65 y.o. female who presents for Medicare Annual/Subsequent preventive examination.  This is my first time meeting this pt. She was prev seen by my colleague Dr. Everlene Farrier annually for her CPE.  Preventive Screening-Counseling & Management  Tobacco History  Smoking Status  . Never Smoker  Smokeless Tobacco  . Never Used    Comment: only for 6 months in the 70s     Problems Prior to Visit 1. Obesity 2. HTN:  On lisinopril-hctz 20-25 stable for several years. 3.  HPL: Elev trig at 269 last yr, LDL 90, non-hdl 144. On lipitor 40, niacin 500mg  qam and fish oil 4.  OSA: On cpap and tolerating well.  Is followed by Dr. Roxy Horseman annually. 5.  On xarelto - for h/o DVT in 2008 followed by Dr. Johnsie Cancel annually 6.   H/o IDA - still on iron supp?? - will stop and see what happens H/o vit D def  Does get lots of calcium in her diet and then twice a day calcium supp. She will decrease this to once a day and it has vitamin D in it.  Has had a mobility issues with many evaluations and treatment - is wearing orthotic shoes and shots in her knees - don't know what to do next - maybe a chiropractor - LBP and can't stand for a long time and myalgias. Would like to go to PT for swimming and land, her prior PT is Lexine Baton and does not have pool exercises. and has been referred to Dr. Estanislado Pandy. Going [place] [employer] market.  Everything hurts.  She gets euflexxa into both her knees every 6 months for several years.  Seeing her for 4 yrs for  Keeps a high wbc back to 2005 when she had cancer. Had short term chemo - they do CA=-125 annually but has not had [place]. Had ovarian cancer in 1 ovary but had both out. Bursitis/arthritis:   Hips, knees, feet, low back. But upper body is good.   Post-nasal drip: takes cetirizine daily but still having a lot of sxs.  No nasal sprays. Keeps zyrtec on all the time. Having year round PND at certain times of the year it does get worse with allergies/hay fever  symptoms.  Sometimes it  Is having trouble from her wisdom tooth.  No GERD  Stomach pain: wonders if she had sensitive areas in her bowels that are intermittent and wants to cont watchful waiting.   Cough from lisinopril intermittently. Has tried other BP meds and was unable to tolerate them. Does not want to change her med.  Leg swelling around her ankles - Had a h/o DVT in 2008 in left leg. Typical lipedema pattern with feet sparing. Thinks that poss muscle atropathy might be exacerbating.    ON EUFLEXXA 20mg  q6 mos.??  Current Problems (verified) Patient Active Problem List   Diagnosis Date Noted  . Sleep apnea with use of continuous positive airway pressure (CPAP) 07/22/2014  . OSA on CPAP 07/04/2013  . Obesity, morbid ([place]) 07/04/2013  . Morbid obesity ([place]) 11/24/2011  . Arthritis of knee 11/24/2011  . Obese 12/04/2010  . Sleep apnea 12/04/2010  . ELECTROCARDIOGRAM, ABNORMAL 06/02/2009  . DVT 03/26/2008  . EDEMA 03/26/2008  . ADENOCARCINOMA, OVARY 03/25/2008  . ESSENTIAL HYPERTENSION, BENIGN 03/25/2008    Medications Prior to Visit Current Outpatient Prescriptions on File Prior to Visit  Medication Sig Dispense Refill  . atorvastatin (LIPITOR) 40 MG tablet TAKE 1 TABLET BY  MOUTH EVERY DAY 90 tablet 0  . calcium carbonate (OS-CAL) 600 MG TABS Take 600 mg by mouth 2 (two) times daily with a meal.      . cetirizine (ZYRTEC) 10 MG tablet Take 10 mg by mouth every morning.     . EUFLEXXA 20 MG/2ML SOSY Inject 20 mg as directed every 6 (six) months.     . ferrous sulfate 325 (65 FE) MG tablet Take 325 mg by mouth every evening.     . fish oil-omega-3 fatty acids 1000 MG capsule Take 1-2 g by mouth 2 (two) times daily.     Marland Kitchen lisinopril-hydrochlorothiazide (PRINZIDE,ZESTORETIC) 20-25 MG tablet TAKE 1 TABLET BY MOUTH DAILY 90 tablet 0  . Multiple Vitamin (MULTIVITAMIN) tablet Take 1 tablet by mouth every morning.     . niacin 500 MG tablet Take 500 mg by mouth every evening.      . rivaroxaban (XARELTO) 20 MG TABS tablet Take 1 tablet (20 mg total) by mouth daily. 90 tablet 3  . vitamin E 400 UNIT capsule Take 400 Units by mouth every morning.      No current facility-administered medications on file prior to visit.     Current Medications (verified) Current Outpatient Prescriptions  Medication Sig Dispense Refill  . atorvastatin (LIPITOR) 40 MG tablet TAKE 1 TABLET BY MOUTH EVERY DAY 90 tablet 0  . calcium carbonate (OS-CAL) 600 MG TABS Take 600 mg by mouth 2 (two) times daily with a meal.      . cetirizine (ZYRTEC) 10 MG tablet Take 10 mg by mouth every morning.     . EUFLEXXA 20 MG/2ML SOSY Inject 20 mg as directed every 6 (six) months.     . ferrous sulfate 325 (65 FE) MG tablet Take 325 mg by mouth every evening.     . fish oil-omega-3 fatty acids 1000 MG capsule Take 1-2 g by mouth 2 (two) times daily.     Marland Kitchen lisinopril-hydrochlorothiazide (PRINZIDE,ZESTORETIC) 20-25 MG tablet TAKE 1 TABLET BY MOUTH DAILY 90 tablet 0  . Multiple Vitamin (MULTIVITAMIN) tablet Take 1 tablet by mouth every morning.     . niacin 500 MG tablet Take 500 mg by mouth every evening.     . rivaroxaban (XARELTO) 20 MG TABS tablet Take 1 tablet (20 mg total) by mouth daily. 90 tablet 3  . vitamin E 400 UNIT capsule Take 400 Units by mouth every morning.      No current facility-administered medications for this visit.      Allergies (verified) Valtrex [valacyclovir hcl]; Benoxinate base; Sulfonamide derivatives; and Ivp dye [iodinated diagnostic agents]   PAST HISTORY  Family History Family History  Problem Relation Age of Onset  . Alzheimer's disease Mother   . Hypertension Mother   . Alzheimer's disease Father   . Hypertension Father   . Colon cancer Father     40  . Hypertension Sister   . Breast cancer Sister   . Hypertension Brother     Social History Social History  Substance Use Topics  . Smoking status: Never Smoker  . Smokeless tobacco: Never Used      Comment: only for 6 months in the 70s  . Alcohol use 0.0 oz/week     Comment: rare, once per month     Are there smokers in your home (other than you)? No  Risk Factors Current exercise habits: The patient does not participate in regular exercise at present.  Dietary issues discussed: normal, no specific diet  Cardiac risk factors: advanced age (older than 74 for men, 46 for women), dyslipidemia, hypertension, obesity (BMI >= 30 kg/m2) and sedentary lifestyle.  Depression Screen Depression screen [facility] 2/9 10/16/2015 10/10/2014 07/13/2014 09/18/2013  Decreased Interest 0 0 0 0  Down, Depressed, Hopeless 0 0 0 0  PHQ - 2 Score 0 0 0 0    (Note: if answer to either of the following is "Yes", a more complete depression screening is indicated)   Over the past two weeks, have you felt down, depressed or hopeless? No  Over the past two weeks, have you felt little interest or pleasure in doing things? No  Have you lost interest or pleasure in daily life? No  Do you often feel hopeless? No  Do you cry easily over simple problems? No  Activities of Daily Living In your present state of health, do you have any difficulty performing the following activities?:  Driving? No Managing money?  No Feeding yourself? No Getting from bed to chair? No Climbing a flight of stairs? No Preparing food and eating?: No Bathing or showering? No Getting dressed: No Getting to the toilet? No Using the toilet:No Moving around from place to place: No In the past year have you fallen or had a near fall?:No   Are you sexually active?  No  Do you have more than one partner?  No  Hearing Difficulties: No Do you often ask people to speak up or repeat themselves? No Do you experience ringing or noises in your ears? No Do you have difficulty understanding soft or whispered voices? No   Do you feel that you have a problem with memory? No  Do you often misplace items? No  Do you feel safe at home?   Yes  Cognitive Testing  Alert? Yes  Normal Appearance?Yes  Oriented to person? Yes  Place? Yes   Time? Yes  Recall of three objects?  Yes  Can perform simple calculations? Yes  Displays appropriate judgment?Yes  Can read the correct time from a watch face?Yes   Advanced Directives have been discussed with the patient? Yes  List the Names of Other Physician/Practitioners you currently use: 1.  Dr. Johnsie Cancel, cardiology 2.  Dr. Estanislado Pandy, rheumatology 3.  Dr. Hale Bogus, gyn 4.  Optho - seen 4x/yr   Indicate any recent Medical Services you may have received from other than [facility] providers in the past year (date may be approximate).  Immunization History  Administered Date(s) Administered  . Influenza,inj,Quad PF,36+ Mos 09/18/2013, 10/10/2014  . Pneumococcal Conjugate-13 09/18/2013  . Pneumococcal Polysaccharide-23 10/10/2014  . Td 07/13/2014    Screening Tests Health Maintenance  Topic Date Due  . INFLUENZA VACCINE  08/12/2015  . MAMMOGRAM  08/27/2017  . COLONOSCOPY  05/20/2024  . TETANUS/TDAP  07/12/2024  . DEXA SCAN  Completed  . ZOSTAVAX  Completed  . Hepatitis C Screening  Completed  . PNA vac Low Risk Adult  Completed    All answers were reviewed with the patient and necessary referrals were made:  SHAW,EVA, MD   10/15/2015   History reviewed: allergies, current medications, past family history, past medical history, past social history, past surgical history and problem list  Review of Systems Pertinent items noted in HPI and remainder of comprehensive ROS otherwise negative.    Objective:   BP 130/74 (BP Location: Left Arm, Patient Position: Sitting, Cuff Size: Large)   Pulse (!) 101   Temp 98.7 F (37.1 C) (Oral)   Resp 16  Ht 5' 6.5" (1.689 m)   Wt (!) 316 lb 3.2 oz (143.4 kg)   LMP 01/12/2003   SpO2 94%   BMI 50.27 kg/m   Visual Acuity Screening   Right eye Left eye Both eyes  Without correction: 20/30 20/25 20/20   With correction:       BP  130/74 (BP Location: Left Arm, Patient Position: Sitting, Cuff Size: Large)   Pulse (!) 101   Temp 98.7 F (37.1 C) (Oral)   Resp 16   Ht 5' 6.5" (1.689 m)   Wt (!) 316 lb 3.2 oz (143.4 kg)   LMP 01/12/2003   SpO2 94%   BMI 50.27 kg/m   General Appearance:    Alert, cooperative, no distress, appears stated age  Head:    Normocephalic, without obvious abnormality, atraumatic  Eyes:    PERRL, conjunctiva/corneas clear, EOM's intact, fundi    benign, both eyes  Ears:    Normal TM's and external ear canals, both ears  Nose:   Nares normal, septum midline, mucosa normal, no drainage    or sinus tenderness  Throat:   Lips, mucosa, and tongue normal; teeth and gums normal  Neck:   Supple, symmetrical, trachea midline, no adenopathy;    thyroid:  no enlargement/tenderness/nodules; no carotid   bruit or JVD  Back:     Symmetric, no curvature, ROM normal, no CVA tenderness  Lungs:     Clear to auscultation bilaterally, respirations unlabored  Chest Wall:    No tenderness or deformity   Heart:    Regular rate and rhythm, S1 and S2 normal, no murmur, rub   or gallop  Breast Exam:    No tenderness, masses, or nipple abnormality  Abdomen:     Soft, non-tender, bowel sounds active all four quadrants,    no masses, no organomegaly        Extremities:   Extremities normal, atraumatic, no cyanosis or edema  Pulses:   2+ and symmetric all extremities  Skin:   Skin color, texture, turgor normal, no rashes or lesions  Lymph nodes:   Cervical, supraclavicular, and axillary nodes normal  Neurologic:   CNII-XII intact, normal strength, sensation and reflexes    throughout        Assessment:     1. Medicare annual wellness visit, subsequent   2. Screening for cardiovascular, respiratory, and genitourinary diseases   3. Screening for thyroid disorder   4. Essential hypertension, benign   5. Obesity, morbid ([place])   6. Mixed hyperlipidemia   7. OSA on CPAP   8. Acute thromboembolism of deep  veins of lower extremity, unspecified laterality ([place])   9. Iron deficiency anemia, unspecified iron deficiency anemia type   10. Post-nasal drip   11. Arthritis   12. Vitamin D deficiency   13. Need for prophylactic vaccination and inoculation against influenza          Plan:     During the course of the visit the patient was educated and counseled about appropriate screening and preventive services including:    CRS: 05/2014 colonoscopy by Dr. Collene Mares  Pelvic/pap: Seen annually by gyn Dr. Hale Bogus.  Seen last wk. Has had a hysterectomy with BSO due to ovarian cancer in 2005.   Mammogram: done 08/17 at [facility] bone scan: done 07/2012 was normal  Immunizations: annual flu shot, prevnar 13 2015, pneumovax 23 2016, tdap 2016, zostavax 2012  REPEAT DEXA WITH FLU SHOT NEXT YR NEEDS FLU SHOT -  done today Ua, tsh, lipid, cmp, cbc   Patient Instructions (the written plan) was given to the patient.  Medicare Attestation I have personally reviewed: The patient's medical and social history Their use of alcohol, tobacco or illicit drugs Their current medications and supplements The patient's functional ability including ADLs,fall risks, home safety risks, cognitive, and hearing and visual impairment Diet and physical activities Evidence for depression or mood disorders  The patient's weight, height, BMI, and visual acuity have been recorded in the chart.  I have made referrals, counseling, and provided education to the patient based on review of the above and I have provided the patient with a written personalized care plan for preventive services.     SHAW,EVA, MD   10/15/2015    Referral to [facility] for low back pain and muscle aches.  Bilateral greater trochanter bursitis with bilateral cortisone injections, low back pain, and lower extremity strengthening.   Orders Placed This Encounter  Procedures  . Flu Vaccine QUAD 36+ mos IM  . TSH  . CBC  .  Comprehensive metabolic panel    Order Specific Question:   Has the patient fasted?    Answer:   Yes  . Lipid panel    Order Specific Question:   Has the patient fasted?    Answer:   Yes  . Ferritin  . VITAMIN D 25 Hydroxy (Vit-D Deficiency, Fractures)  . Hemoglobin A1c  . Ambulatory referral to Physical Therapy    Referral Priority:   Routine    Referral Type:   Physical Medicine    Referral Reason:   Specialty Services Required    Requested Specialty:   Physical Therapy    Number of Visits Requested:   1  . POCT urinalysis dipstick    Meds ordered this encounter  Medications  . fluticasone (FLONASE) 50 MCG/ACT nasal spray    Sig: Place 2 sprays into both nostrils at bedtime.    Dispense:  16 g    Refill:  5  . levocetirizine (XYZAL) 5 MG tablet    Sig: Take 1 tablet (5 mg total) by mouth every evening.    Dispense:  30 tablet    Refill:  5  . benzonatate (TESSALON) 200 MG capsule    Sig: Take 1 capsule (200 mg total) by mouth 3 (three) times daily as needed for cough.    Dispense:  60 capsule    Refill:  1  . atorvastatin (LIPITOR) 40 MG tablet    Sig: Take 1 tablet (40 mg total) by mouth daily.    Dispense:  90 tablet    Refill:  3  . lisinopril-hydrochlorothiazide (PRINZIDE,ZESTORETIC) 20-25 MG tablet    Sig: Take 1 tablet by mouth daily.    Dispense:  90 tablet    Refill:  3    I personally performed the services described in this documentation, which was scribed in my presence. The recorded information has been reviewed and considered, and addended by me as needed.   Delman Cheadle, M.D.  [facility] [address] [place], [place] [address] [phone] phone [phone] fax  11/05/15 10:43 AM

## 2015-10-16 ENCOUNTER — Ambulatory Visit (INDEPENDENT_AMBULATORY_CARE_PROVIDER_SITE_OTHER): Payer: Medicare Other | Admitting: Family Medicine

## 2015-10-16 ENCOUNTER — Encounter: Payer: Self-pay | Admitting: Family Medicine

## 2015-10-16 VITALS — BP 130/74 | HR 101 | Temp 98.7°F | Resp 16 | Ht 66.5 in | Wt 316.2 lb

## 2015-10-16 DIAGNOSIS — E782 Mixed hyperlipidemia: Secondary | ICD-10-CM | POA: Diagnosis not present

## 2015-10-16 DIAGNOSIS — I82409 Acute embolism and thrombosis of unspecified deep veins of unspecified lower extremity: Secondary | ICD-10-CM

## 2015-10-16 DIAGNOSIS — M7062 Trochanteric bursitis, left hip: Secondary | ICD-10-CM

## 2015-10-16 DIAGNOSIS — Z136 Encounter for screening for cardiovascular disorders: Secondary | ICD-10-CM

## 2015-10-16 DIAGNOSIS — M7061 Trochanteric bursitis, right hip: Secondary | ICD-10-CM

## 2015-10-16 DIAGNOSIS — E559 Vitamin D deficiency, unspecified: Secondary | ICD-10-CM | POA: Diagnosis not present

## 2015-10-16 DIAGNOSIS — Z9989 Dependence on other enabling machines and devices: Secondary | ICD-10-CM

## 2015-10-16 DIAGNOSIS — Z0000 Encounter for general adult medical examination without abnormal findings: Secondary | ICD-10-CM

## 2015-10-16 DIAGNOSIS — Z1389 Encounter for screening for other disorder: Secondary | ICD-10-CM | POA: Diagnosis not present

## 2015-10-16 DIAGNOSIS — I10 Essential (primary) hypertension: Secondary | ICD-10-CM | POA: Diagnosis not present

## 2015-10-16 DIAGNOSIS — D509 Iron deficiency anemia, unspecified: Secondary | ICD-10-CM

## 2015-10-16 DIAGNOSIS — Z1329 Encounter for screening for other suspected endocrine disorder: Secondary | ICD-10-CM | POA: Diagnosis not present

## 2015-10-16 DIAGNOSIS — Z23 Encounter for immunization: Secondary | ICD-10-CM

## 2015-10-16 DIAGNOSIS — M1990 Unspecified osteoarthritis, unspecified site: Secondary | ICD-10-CM

## 2015-10-16 DIAGNOSIS — Z1383 Encounter for screening for respiratory disorder NEC: Secondary | ICD-10-CM

## 2015-10-16 DIAGNOSIS — R0982 Postnasal drip: Secondary | ICD-10-CM

## 2015-10-16 DIAGNOSIS — G4733 Obstructive sleep apnea (adult) (pediatric): Secondary | ICD-10-CM

## 2015-10-16 LAB — COMPREHENSIVE METABOLIC PANEL
ALBUMIN: 4.3 g/dL (ref 3.6–5.1)
ALK PHOS: 92 U/L (ref 33–130)
ALT: 24 U/L (ref 6–29)
AST: 24 U/L (ref 10–35)
BILIRUBIN TOTAL: 0.8 mg/dL (ref 0.2–1.2)
BUN: 19 mg/dL (ref 7–25)
CALCIUM: 9.9 mg/dL (ref 8.6–10.4)
CO2: 28 mmol/L (ref 20–31)
Chloride: 103 mmol/L (ref 98–110)
Creat: 0.89 mg/dL (ref 0.50–0.99)
Glucose, Bld: 100 mg/dL — ABNORMAL HIGH (ref 65–99)
Potassium: 4.6 mmol/L (ref 3.5–5.3)
Sodium: 142 mmol/L (ref 135–146)
Total Protein: 6.7 g/dL (ref 6.1–8.1)

## 2015-10-16 LAB — LIPID PANEL
CHOLESTEROL: 167 mg/dL (ref 125–200)
HDL: 44 mg/dL — AB (ref >=46)
LDL Cholesterol: 72 mg/dL (ref <130)
TRIGLYCERIDES: 254 mg/dL — AB (ref <150)
Total CHOL/HDL Ratio: 3.8 Ratio (ref <=5.0)
VLDL: 51 mg/dL — ABNORMAL HIGH (ref <30)

## 2015-10-16 LAB — POCT URINALYSIS DIP (MANUAL ENTRY)
BILIRUBIN UA: NEGATIVE
GLUCOSE UA: NEGATIVE
Ketones, POC UA: NEGATIVE
Leukocytes, UA: NEGATIVE
NITRITE UA: NEGATIVE
Protein Ur, POC: NEGATIVE
RBC UA: NEGATIVE
Spec Grav, UA: 1.02
Urobilinogen, UA: 0.2
pH, UA: 5

## 2015-10-16 LAB — CBC
HEMATOCRIT: 40.5 % (ref 35.0–45.0)
HEMOGLOBIN: 13.5 g/dL (ref 11.7–15.5)
MCH: 29.5 pg (ref 27.0–33.0)
MCHC: 33.3 g/dL (ref 32.0–36.0)
MCV: 88.4 fL (ref 80.0–100.0)
MPV: 9.2 fL (ref 7.5–12.5)
Platelets: 158 10*3/uL (ref 140–400)
RBC: 4.58 MIL/uL (ref 3.80–5.10)
RDW: 13.7 % (ref 11.0–15.0)
WBC: 3 10*3/uL — AB (ref 3.8–10.8)

## 2015-10-16 LAB — FERRITIN: Ferritin: 945 ng/mL — ABNORMAL HIGH (ref 20–288)

## 2015-10-16 LAB — TSH: TSH: 2.59 mIU/L

## 2015-10-16 MED ORDER — ATORVASTATIN CALCIUM 40 MG PO TABS: 40.0000 mg | ORAL_TABLET | Freq: Every day | ORAL | 3 refills | Status: DC

## 2015-10-16 MED ORDER — LEVOCETIRIZINE DIHYDROCHLORIDE 5 MG PO TABS: 5.0000 mg | ORAL_TABLET | Freq: Every evening | ORAL | 5 refills | Status: DC

## 2015-10-16 MED ORDER — BENZONATATE 200 MG PO CAPS: 200.0000 mg | ORAL_CAPSULE | Freq: Three times a day (TID) | ORAL | 1 refills | Status: DC | PRN

## 2015-10-16 MED ORDER — LISINOPRIL-HYDROCHLOROTHIAZIDE 20-25 MG PO TABS: 1.0000 | ORAL_TABLET | Freq: Every day | ORAL | 3 refills | Status: DC

## 2015-10-16 MED ORDER — FLUTICASONE PROPIONATE 50 MCG/ACT NA SUSP: 2.0000 | Freq: Every day | NASAL | 5 refills | Status: DC

## 2015-10-16 NOTE — Patient Instructions (Addendum)
The most common causes of chronic cough in immunocompetent adults include the following: upper airway cough syndrome (UACS), previously referred to as postnasal drip syndrome (PNDS), which is caused by variety of rhinosinus conditions.  Most likely this is Classic Upper airway cough syndrome, so named because it's frequently impossible to sort out how much is chronic rhinitis/sinusitis with freq throat clearing.  Try at night allergy medications since this is when cough is the worst. If things are not improving, come back in so we can trouble shoot switching your blood pressure medicine temporarily and start on a proton-pump inhibitor for a month or two. Suppress your cough and throat clearing with delsym and tessalon pearles, not throat lozenges.  Stop the vit E, iron, and decrease the calcium to once a day.  IF you received an x-ray today, you will receive an invoice from [facility]. Please contact [facility] at [phone] with questions or concerns regarding your invoice.   IF you received labwork today, you will receive an invoice from [employer]. Please contact [facility] at [phone] with questions or concerns regarding your invoice.   Our billing staff will not be able to assist you with questions regarding bills from these companies.  You will be contacted with the lab results as soon as they are available. The fastest way to get your results is to activate your My Chart account. Instructions are located on the last page of this paperwork. If you have not heard from [place] regarding the results in 2 weeks, please contact this office.    Bone Health Bones protect organs, store calcium, and anchor muscles. Good health habits, such as eating nutritious foods and exercising regularly, are important for maintaining healthy bones. They can also help to prevent a condition that causes bones to lose density and become weak and brittle (osteoporosis). WHY  IS BONE MASS IMPORTANT? Bone mass refers to the amount of bone tissue that you have. The higher your bone mass, the stronger your bones. An important step toward having healthy bones throughout life is to have strong and dense bones during childhood. A young adult who has a high bone mass is more likely to have a high bone mass later in life. Bone mass at its greatest it is called peak bone mass. A large decline in bone mass occurs in older adults. In women, it occurs about the time of menopause. During this time, it is important to practice good health habits, because if more bone is lost than what is replaced, the bones will become less healthy and more likely to break (fracture). If you find that you have a low bone mass, you may be able to prevent osteoporosis or further bone loss by changing your diet and lifestyle. HOW CAN I FIND OUT IF MY BONE MASS IS LOW? Bone mass can be measured with an X-ray test that is called a bone mineral density (BMD) test. This test is recommended for all women who are age 48 or older. It may also be recommended for men who are age 74 or older, or for people who are more likely to develop osteoporosis due to:  Having bones that break easily.  Having a long-term disease that weakens bones, such as kidney disease or rheumatoid arthritis.  Having menopause earlier than normal.  Taking medicine that weakens bones, such as steroids, thyroid hormones, or hormone treatment for breast cancer or prostate cancer.  Smoking.  Drinking three or more alcoholic drinks each day. WHAT ARE THE  NUTRITIONAL RECOMMENDATIONS FOR HEALTHY BONES? To have healthy bones, you need to get enough of the right minerals and vitamins. Most nutrition experts recommend getting these nutrients from the foods that you eat. Nutritional recommendations vary from person to person. Ask your health care provider what is healthy for you. Here are some general guidelines. Calcium Recommendations Calcium is  the most important (essential) mineral for bone health. Most people can get enough calcium from their diet, but supplements may be recommended for people who are at risk for osteoporosis. Good sources of calcium include:  Dairy products, such as low-fat or nonfat milk, cheese, and yogurt.  Dark green leafy vegetables, such as bok choy and broccoli.  Calcium-fortified foods, such as orange juice, cereal, bread, soy beverages, and tofu products.  Nuts, such as almonds. Follow these recommended amounts for daily calcium intake:  Children, age 797-3: 700 mg.  Children, age 79-8: 1,000 mg.  Children, age 87-13: 1,300 mg.  Teens, age 82-18: 1,300 mg.  Adults, age 74-50: 1,000 mg.  Adults, age 790-70:  Men: 1,000 mg.  Women: 1,200 mg.  Adults, age 83 or older: 1,200 mg.  Pregnant and breastfeeding females:  Teens: 1,300 mg.  Adults: 1,000 mg. Vitamin D Recommendations Vitamin D is the most essential vitamin for bone health. It helps the body to absorb calcium. Sunlight stimulates the skin to make vitamin D, so be sure to get enough sunlight. If you live in a cold climate or you do not get outside often, your health care provider may recommend that you take vitamin D supplements. Good sources of vitamin D in your diet include:  Egg yolks.  Saltwater fish.  Milk and cereal fortified with vitamin D. Follow these recommended amounts for daily vitamin D intake:  Children and teens, age 797-18: 600 international units.  Adults, age 35 or younger: 400-800 international units.  Adults, age 797 or older: 800-1,000 international units. Other Nutrients Other nutrients for bone health include:  Phosphorus. This mineral is found in meat, poultry, dairy foods, nuts, and legumes. The recommended daily intake for adult men and adult women is 700 mg.  Magnesium. This mineral is found in seeds, nuts, dark green vegetables, and legumes. The recommended daily intake for adult men is 400-420 mg.  For adult women, it is 310-320 mg.  Vitamin K. This vitamin is found in green leafy vegetables. The recommended daily intake is 120 mg for adult men and 90 mg for adult women. WHAT TYPE OF PHYSICAL ACTIVITY IS BEST FOR BUILDING AND MAINTAINING HEALTHY BONES? Weight-bearing and strength-building activities are important for building and maintaining peak bone mass. Weight-bearing activities cause muscles and bones to work against gravity. Strength-building activities increases muscle strength that supports bones. Weight-bearing and muscle-building activities include:  Walking and hiking.  Jogging and running.  Dancing.  Gym exercises.  Lifting weights.  Tennis and racquetball.  Climbing stairs.  Aerobics. Adults should get at least 30 minutes of moderate physical activity on most days. Children should get at least 60 minutes of moderate physical activity on most days. Ask your health care provide what type of exercise is best for you. WHERE CAN I FIND MORE INFORMATION? For more information, check out the following websites:  [place]: YardHomes.se  [employer] of Health: http://www.niams.AnonymousEar.fr.asp   This information is not intended to replace advice given to you by your health care provider. Make sure you discuss any questions you have with your health care provider.   Document Released: 03/20/2003 Document Revised:  05/14/2014 Document Reviewed: 01/02/2014 Elsevier Interactive Patient Education [employer].

## 2015-10-17 LAB — HEMOGLOBIN A1C
HEMOGLOBIN A1C: 5.2 % (ref <5.7)
MEAN PLASMA GLUCOSE: 103 mg/dL

## 2015-10-17 LAB — VITAMIN D 25 HYDROXY (VIT D DEFICIENCY, FRACTURES): VIT D 25 HYDROXY: 31 ng/mL (ref 30–100)

## 2015-10-23 ENCOUNTER — Ambulatory Visit (INDEPENDENT_AMBULATORY_CARE_PROVIDER_SITE_OTHER): Payer: Medicare Other | Admitting: Rheumatology

## 2015-10-23 DIAGNOSIS — M19071 Primary osteoarthritis, right ankle and foot: Secondary | ICD-10-CM

## 2015-10-23 DIAGNOSIS — M1711 Unilateral primary osteoarthritis, right knee: Secondary | ICD-10-CM | POA: Diagnosis not present

## 2015-10-23 DIAGNOSIS — M5137 Other intervertebral disc degeneration, lumbosacral region: Secondary | ICD-10-CM

## 2015-10-23 DIAGNOSIS — M19041 Primary osteoarthritis, right hand: Secondary | ICD-10-CM

## 2015-10-23 DIAGNOSIS — M5030 Other cervical disc degeneration, unspecified cervical region: Secondary | ICD-10-CM

## 2015-10-28 ENCOUNTER — Telehealth: Payer: Self-pay

## 2015-10-28 NOTE — Telephone Encounter (Signed)
Pt left VM with referrals stating that she wants to discuss her rx from her rheumatologist with someone, as it may affect where Dr. Brigitte Pulse decides to send her PT referral. If so, please let me know. I do not feel comfortable discussing rx's with her as I have no experience in that area. Thanks!

## 2015-10-30 NOTE — Telephone Encounter (Signed)
Pt reported that the Rx she was referring to was not a medication Rx, it was an order for PT from rheumatologist as to what type of therapy was needed. Pt stated that she can just take it directly to the PT and Dr Brigitte Pulse would not even have to do a referral, but wanted to make sure that Dr Brigitte Pulse was not bypassed, and that [facility] had water and land PT. Checked w/Dr Brigitte Pulse and they do have both types of therapy needed, and we can go ahead and do referral to make sure that they have everything they need and she should also get reports that way. Chrys Racer, can you please get the referral sent to [facility] as "eval and treat" from Dr Brigitte Pulse, but pt will also take the orders from rheumatologist. Notified pt.

## 2015-10-31 NOTE — Telephone Encounter (Signed)
Once the office visit notes are completed, I will send this referral out. Thanks!

## 2015-11-03 NOTE — Telephone Encounter (Signed)
[name], I wanted to make sure you realized that the OV notes have been completed, so you can proceed with referral.

## 2015-11-04 ENCOUNTER — Encounter: Payer: Self-pay | Admitting: Emergency Medicine

## 2015-11-04 NOTE — Telephone Encounter (Signed)
Unfortunately they are still not signed, so I still cannot send it out. Thank you though!

## 2015-11-05 NOTE — Telephone Encounter (Signed)
done

## 2015-11-05 NOTE — Telephone Encounter (Signed)
Dr Brigitte Pulse, can you please sign OV notes from 10/5 OV so that Chrys Racer can send out the referral? Thanks!

## 2015-11-11 ENCOUNTER — Other Ambulatory Visit: Payer: Self-pay | Admitting: Cardiovascular Disease

## 2015-12-29 ENCOUNTER — Ambulatory Visit (INDEPENDENT_AMBULATORY_CARE_PROVIDER_SITE_OTHER): Payer: Medicare Other | Admitting: Urgent Care

## 2015-12-29 VITALS — BP 132/80 | HR 99 | Temp 97.9°F | Resp 17 | Ht 66.5 in | Wt 310.0 lb

## 2015-12-29 DIAGNOSIS — B9789 Other viral agents as the cause of diseases classified elsewhere: Secondary | ICD-10-CM

## 2015-12-29 DIAGNOSIS — J069 Acute upper respiratory infection, unspecified: Secondary | ICD-10-CM | POA: Diagnosis not present

## 2015-12-29 DIAGNOSIS — R0982 Postnasal drip: Secondary | ICD-10-CM | POA: Diagnosis not present

## 2015-12-29 MED ORDER — AMOXICILLIN 500 MG PO CAPS: 500.0000 mg | ORAL_CAPSULE | Freq: Two times a day (BID) | ORAL | 0 refills | Status: DC

## 2015-12-29 MED ORDER — HYDROCODONE-HOMATROPINE 5-1.5 MG/5ML PO SYRP: 5.0000 mL | ORAL_SOLUTION | Freq: Every evening | ORAL | 0 refills | Status: DC | PRN

## 2015-12-29 NOTE — Patient Instructions (Addendum)
If you have no improvement or worsening of your symptoms come Thursday, then fill the script for amoxicillin. You can use the script for the cough syrup if you start to have trouble with your cough at night, interrupting your sleep.    Upper Respiratory Infection, Adult Most upper respiratory infections (URIs) are a viral infection of the air passages leading to the lungs. A URI affects the nose, throat, and upper air passages. The most common type of URI is nasopharyngitis and is typically referred to as "the common cold." URIs run their course and usually go away on their own. Most of the time, a URI does not require medical attention, but sometimes a bacterial infection in the upper airways can follow a viral infection. This is called a secondary infection. Sinus and middle ear infections are common types of secondary upper respiratory infections. Bacterial pneumonia can also complicate a URI. A URI can worsen asthma and chronic obstructive pulmonary disease (COPD). Sometimes, these complications can require emergency medical care and may be life threatening. What are the causes? Almost all URIs are caused by viruses. A virus is a type of germ and can spread from one person to another. What increases the risk? You may be at risk for a URI if:  You smoke.  You have chronic heart or lung disease.  You have a weakened defense (immune) system.  You are very young or very old.  You have nasal allergies or asthma.  You work in crowded or poorly ventilated areas.  You work in health care facilities or schools. What are the signs or symptoms? Symptoms typically develop 2-3 days after you come in contact with a cold virus. Most viral URIs last 7-10 days. However, viral URIs from the influenza virus (flu virus) can last 14-18 days and are typically more severe. Symptoms may include:  Runny or stuffy (congested) nose.  Sneezing.  Cough.  Sore  throat.  Headache.  Fatigue.  Fever.  Loss of appetite.  Pain in your forehead, behind your eyes, and over your cheekbones (sinus pain).  Muscle aches. How is this diagnosed? Your health care provider may diagnose a URI by:  Physical exam.  Tests to check that your symptoms are not due to another condition such as:  Strep throat.  Sinusitis.  Pneumonia.  Asthma. How is this treated? A URI goes away on its own with time. It cannot be cured with medicines, but medicines may be prescribed or recommended to relieve symptoms. Medicines may help:  Reduce your fever.  Reduce your cough.  Relieve nasal congestion. Follow these instructions at home:  Take medicines only as directed by your health care provider.  Gargle warm saltwater or take cough drops to comfort your throat as directed by your health care provider.  Use a warm mist humidifier or inhale steam from a shower to increase air moisture. This may make it easier to breathe.  Drink enough fluid to keep your urine clear or pale yellow.  Eat soups and other clear broths and maintain good nutrition.  Rest as needed.  Return to work when your temperature has returned to normal or as your health care provider advises. You may need to stay home longer to avoid infecting others. You can also use a face mask and careful hand washing to prevent spread of the virus.  Increase the usage of your inhaler if you have asthma.  Do not use any tobacco products, including cigarettes, chewing tobacco, or electronic cigarettes. If you need  help quitting, ask your health care provider. How is this prevented? The best way to protect yourself from getting a cold is to practice good hygiene.  Avoid oral or hand contact with people with cold symptoms.  Wash your hands often if contact occurs. There is no clear evidence that vitamin C, vitamin E, echinacea, or exercise reduces the chance of developing a cold. However, it is always  recommended to get plenty of rest, exercise, and practice good nutrition. Contact a health care provider if:  You are getting worse rather than better.  Your symptoms are not controlled by medicine.  You have chills.  You have worsening shortness of breath.  You have brown or red mucus.  You have yellow or brown nasal discharge.  You have pain in your face, especially when you bend forward.  You have a fever.  You have swollen neck glands.  You have pain while swallowing.  You have white areas in the back of your throat. Get help right away if:  You have severe or persistent:  Headache.  Ear pain.  Sinus pain.  Chest pain.  You have chronic lung disease and any of the following:  Wheezing.  Prolonged cough.  Coughing up blood.  A change in your usual mucus.  You have a stiff neck.  You have changes in your:  Vision.  Hearing.  Thinking.  Mood. This information is not intended to replace advice given to you by your health care provider. Make sure you discuss any questions you have with your health care provider. Document Released: 06/23/2000 Document Revised: 08/31/2015 Document Reviewed: 04/04/2013 Elsevier Interactive Patient Education  2017 [employer].     IF you received an x-ray today, you will receive an invoice from [facility]. Please contact [facility] at [phone] with questions or concerns regarding your invoice.   IF you received labwork today, you will receive an invoice from [facility]. Please contact [facility] at [phone] with questions or concerns regarding your invoice.   Our billing staff will not be able to assist you with questions regarding bills from these companies.  You will be contacted with the lab results as soon as they are available. The fastest way to get your results is to activate your My Chart account. Instructions are located on the last page of this paperwork. If you have not heard from  [place] regarding the results in 2 weeks, please contact this office.

## 2015-12-29 NOTE — Progress Notes (Signed)
    MRN: [id number] DOB: 05-07-1950  Subjective:   [name] is a 65 y.o. female presenting for chief complaint of Sinus Problem (x 1week. cough, sore throat, headache. )  Reports 1 week history of post-nasal drainage, sore throat, productive cough, sinus headache, chills, wheezing. Cough and sore throat are worst in the morning. Has tried APAP. Denies fever, chest pain, shob, n/v, abdominal pain, rashes. She had her flu shot 10/16/2015. Has a history of allergies, managed with Flonase, Zyrtec daily. Denies smoking cigarettes.  [name] has a current medication list which includes the following prescription(s): atorvastatin, benzonatate, cetirizine, euflexxa, fish oil-omega-3 fatty acids, fluticasone, lisinopril-hydrochlorothiazide, multivitamin, niacin, xarelto, and levocetirizine. Also is allergic to valtrex [valacyclovir hcl]; benoxinate base; sulfonamide derivatives; and ivp dye [iodinated diagnostic agents].  [name]  has a past medical history of Anemia; Basal cell carcinoma of nose; Bronchitis; Cancer of ovary ([place]) (2005); Diverticulitis; DVT (deep vein thrombosis) in pregnancy ([place]); Femoral DVT (deep venous thrombosis) ([place]) (2008); HTN (hypertension); Hyperlipidemia; OSA (obstructive sleep apnea); and Shingles. Also  has a past surgical history that includes Total abdominal hysterectomy w/ bilateral salpingoophorectomy (2005); Excision basal cell carcinoma; cataract surgery (Bilateral, [id number], 2005); bilateral breast mass (1970); Hernia repair (2008); and Colonoscopy with propofol (N/A, 05/21/2014).   Objective:   Vitals: BP 132/80 (BP Location: Left Arm, Patient Position: Sitting, Cuff Size: Large)   Pulse 99   Temp 97.9 F (36.6 C) (Oral)   Resp 17   Ht 5' 6.5" (1.689 m)   Wt (!) 310 lb (140.6 kg)   LMP 01/12/2003   SpO2 94%   BMI 49.29 kg/m   Physical Exam  Constitutional: She is oriented to person, place, and time. She appears well-developed and well-nourished.    HENT:  TM's intact bilaterally but no effusions or erythema. Nasal turbinates pink, dry without sinus tenderness. Significant postnasal drip present but without oropharyngeal exudates, erythema or abscesses.  Eyes: Right eye exhibits no discharge. Left eye exhibits no discharge.  Neck: Normal range of motion. Neck supple.  Cardiovascular: Normal rate, regular rhythm and intact distal pulses.  Exam reveals no gallop and no friction rub.   No murmur heard. Pulmonary/Chest: No respiratory distress. She has no wheezes. She has no rales.  Lymphadenopathy:    She has no cervical adenopathy.  Neurological: She is alert and oriented to person, place, and time.  Skin: Skin is warm and dry.    Assessment and Plan :   1. Viral URI with cough 2. Post-nasal drainage - Likely viral in nature, advised supportive care. Continue aggressive management of her allergies with Flonase and Zyrtec. She cannot take Sudafed due to adverse effect of palpitations. She is traveling out of town for Christmas so I provided her with a written script for amoxicillin to address bacterial sinusitis if she has no improvement in 3-4 days.   Jaynee Eagles, PA-C [facility] [phone] 12/29/2015 1:53 PM

## 2015-12-30 DIAGNOSIS — H401132 Primary open-angle glaucoma, bilateral, moderate stage: Secondary | ICD-10-CM | POA: Insufficient documentation

## 2016-01-08 NOTE — Progress Notes (Signed)
Office Visit Note  Patient: [name]             Date of Birth: 16-Dec-1950           MRN: [id number]             PCP: Delman Cheadle, MD Referring: Shawnee Knapp, MD Visit Date: 01/09/2016 Occupation: '@GUAROCC' @    Subjective:  Pain of the Right Knee; Pain of the Left Knee; Pain of the Left Hip; Pain of the Right Hip; and Pain of the Lower Back   History of Present Illness: [name] is a 65 y.o. female  Last seen 10/23/2015. Patient is having pain to the left greater trochanter bursa. She is requesting a cortisone injection if appropriate. The last time we gave an injection in the left greater trochanter bursa was approximately 22 months ago. She has been doing appropriate exercises as well as swimming which is preventing flare of her bursa.  She struggles with knee joint pain. Currently her right knee is hurting and she describes that pain as approximately 7-8 on a scale of 0-10. The left knee is hurting as well and she describes that pain as a 4 on a scale of 0-10. Patient is on a blood thinner so she is unable to take any kind of NSAIDs. As a result even Voltaren gel may not be suitable for the patient. We requested the patient to check in with her doctor regarding Voltaren gel and if appropriate I'll be happy to prescribe it if her doctor permits her to use this medication.  Patient continues to exercise on a regular basis that includes swimming. She is making progress on weight loss.  She has done well with her Visco supplements in the past. She completed her Euflex injections 3 bilateral knees on 09/12/2015. She'll be eligible for repeat injections 6 months from that date.  Activities of Daily Living:  Patient reports morning stiffness for 60 minutes.   Patient Reports nocturnal pain.  Difficulty dressing/grooming: Reports Difficulty climbing stairs: Reports Difficulty getting out of chair: Reports Difficulty using hands for taps, buttons, cutlery, and/or  writing: Reports   Review of Systems  Constitutional: Negative for fatigue.  HENT: Negative for mouth sores and mouth dryness.   Eyes: Negative for dryness.  Respiratory: Negative for shortness of breath.   Gastrointestinal: Negative for constipation and diarrhea.  Musculoskeletal: Negative for myalgias and myalgias.  Skin: Negative for sensitivity to sunlight.  Psychiatric/Behavioral: Negative for decreased concentration and sleep disturbance.    PMFS History:  Patient Active Problem List   Diagnosis Date Noted  . Hyperlipidemia 10/15/2015  . Anemia 10/15/2015  . Sleep apnea with use of continuous positive airway pressure (CPAP) 07/22/2014  . OSA on CPAP 07/04/2013  . Obesity, morbid ([place]) 07/04/2013  . Morbid obesity ([place]) 11/24/2011  . Arthritis of knee 11/24/2011  . Obese 12/04/2010  . Sleep apnea 12/04/2010  . ELECTROCARDIOGRAM, ABNORMAL 06/02/2009  . Acute thromboembolism of deep veins of lower extremity ([place]) 03/26/2008  . EDEMA 03/26/2008  . ADENOCARCINOMA, OVARY 03/25/2008  . ESSENTIAL HYPERTENSION, BENIGN 03/25/2008    Past Medical History:  Diagnosis Date  . Anemia    oral iron occ.  . Basal cell carcinoma of nose   . Bronchitis   . 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 Diverticulitis    past history  . DVT (deep vein thrombosis) in pregnancy ([place])    LEFT LEG  . Femoral  DVT (deep venous thrombosis) ([place]) 2008   -"wears compression hose all times" left leg  . HTN (hypertension)   . Hyperlipidemia   . OSA (obstructive sleep apnea)    hypoventilation, on CPAP  . Shingles     Family History  Problem Relation Age of Onset  . Alzheimer's disease Mother   . Hypertension Mother   . Alzheimer's disease Father   . Hypertension Father   . Colon cancer Father     45  . Hypertension Sister   . Breast cancer Sister   . Hypertension Brother    Past Surgical History:  Procedure Laterality Date  . BASAL CELL CARCINOMA EXCISION      resection with plastic surgery reconstrustion  . bilateral breast mass  1970   benigned, left breast  . cataract surgery Bilateral [id number], 2005   with bilateral lens replacements  . COLONOSCOPY WITH PROPOFOL N/A 05/21/2014   Procedure: COLONOSCOPY WITH PROPOFOL;  Surgeon: Juanita Craver, MD;  Location: [facility];  Service: Endoscopy;  Laterality: N/A;  . HERNIA REPAIR  3419   umbilical repair with mesh   . 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 PSH showed an AHI of 120!!!! titrated to 8 cm watr cPAP , residual AHI of 1 and downlaod was done  01-23-11 .  excellent compliance - 7 hours and low leak,  nasal pillow mask.    BMI is considered morbidly obese.  discussed low carb  diet - weight watchers and restricted exercise. , aquatic .    Patient cannot exercise due to soreness in proximal muscles.and  would like to add coenzyme q 10 and carnitine . She needs a medical weight loss regimen - bariatric surgery?       FSS 48, [place] 4 again ,  CMS compliant  residual AHI 1.1 and average use 6.48  hours , no naps    Patient is single and lives alone.   Patient does not have any children.   Patient is retired.   Patient has a Master's degree.   Patient is left-handed.   Patient drinks tea occasionally.               Objective: Vital Signs: BP 138/84   Pulse 78   Resp 18   Ht '5\' 7"'  (1.702 m)   Wt (!) 310 lb (140.6 kg)   LMP 01/12/2003   BMI 48.55 kg/m    Physical Exam  Constitutional: She is oriented to person, place, and time. She appears well-developed and well-nourished.  HENT:  Head: Normocephalic and atraumatic.  Eyes: EOM are normal. Pupils are equal, round, and reactive to light.  Cardiovascular: Normal rate, regular rhythm and normal heart sounds.  Exam reveals no gallop and no friction rub.   No murmur  heard. Pulmonary/Chest: Effort normal and breath sounds normal. She has no wheezes. She has no rales.  Abdominal: Soft. Bowel sounds are normal. She exhibits no distension. There is no tenderness. There is no guarding. No hernia.  Musculoskeletal: Normal range of motion. She exhibits no edema, tenderness or deformity.  Lymphadenopathy:    She has no cervical adenopathy.  Neurological: She is alert and oriented to person, place, and time. Coordination normal.  Skin: Skin is warm and dry. Capillary refill takes less than 2 seconds. No rash noted.  Psychiatric: She has a  normal mood and affect. Her behavior is normal.  Nursing note and vitals reviewed.    Musculoskeletal Exam:  Full range of motion of all joints Grip strength is equal and strong bilaterally Fibromyalgia tender points are all absent  CDAI Exam: CDAI Homunculus Exam:   Joint Counts:  CDAI Tender Joint count: 0 CDAI Swollen Joint count: 0  Global Assessments:  Patient Global Assessment: 6 Provider Global Assessment: 7  CDAI Calculated Score: 13    Investigation: Findings:  10/23/2015 X-ray of the right knee joint shows severe medial compartment narrowing and medial and lateral osteophytes.  Severe patellofemoral joint space narrowing.  03/06/2013 x-ray, 3 views of her right ankle joint, today, which was unremarkable except for calcaneal spur.  No significant joint space narrowing or erosive changes were noted.       Imaging: No results found.  Speciality Comments: No specialty comments available.    Procedures:  Large Joint Inj Date/Time: 01/09/2016 2:36 PM Performed by: Eliezer Lofts Authorized by: Eliezer Lofts   Consent Given by:  Patient Site marked: the procedure site was marked   Timeout: prior to procedure the correct patient, procedure, and site was verified   Indications:  Pain Location:  Hip Site:  L greater trochanter Prep: patient was prepped and draped in usual sterile fashion    Needle Size:  27 G Approach:  Superior Ultrasound Guidance: No   Fluoroscopic Guidance: No   Arthrogram: No   Medications:  1.5 mL lidocaine 1 %; 40 mg triamcinolone acetonide 40 MG/ML Aspiration Attempted: Yes   Aspirate amount (mL):  0 Patient tolerance:  Patient tolerated the procedure well with no immediate complications  After informed consent was obtained, the left greater trochanter bursa was prepped in usual sterile fashion and injected with 40 mg of Kenalog mixed with 1-1/2 of 1% lidocaine. The patient tolerated procedure well. There was no complication. Large Joint Inj Date/Time: 01/09/2016 2:37 PM Performed by: Eliezer Lofts Authorized by: Eliezer Lofts   Consent Given by:  Patient Site marked: the procedure site was marked   Timeout: prior to procedure the correct patient, procedure, and site was verified   Indications:  Pain and joint swelling Location:  Knee Site:  R knee Prep: patient was prepped and draped in usual sterile fashion   Needle Size:  27 G Needle Length:  1.5 inches Approach:  Medial Ultrasound Guidance: No   Fluoroscopic Guidance: No   Arthrogram: No   Medications:  1.5 mL lidocaine 1 %; 40 mg triamcinolone acetonide 40 MG/ML Aspiration Attempted: Yes   Patient tolerance:  Patient tolerated the procedure well with no immediate complications  After informed consent was obtained, the right knee was prepped in usual sterile fashion and injected with 40 mg of Kenalog mixed with 1-1/2 of 1% lidocaine. The patient tolerated procedure well. There was no complication.   Patient's left greater trochanter bursa was injected and right knee joint was injected. Patient received significant benefit few minutes after getting each of these injections.  Allergies: Valtrex [valacyclovir hcl]; Benoxinate base; Sulfonamide derivatives; and Ivp dye [iodinated diagnostic agents]   Assessment / Plan:     Visit Diagnoses: Primary osteoarthritis of both  knees  Primary osteoarthritis of both hands  Primary osteoarthritis of both feet  Greater trochanteric bursitis of left hip  DJD (degenerative joint disease), cervical  Spondylosis of lumbar region without myelopathy or radiculopathy   Plan: #1: Left greater trochanteric bursitis . Please see procedure above. Injection given to patient. Patient tolerated procedure  well.  #2: right knee joint pain consistent with flare of her osteoarthritis of the knee joint. Since patient's pain is about 7 on a scale of 0-10 and despite patient having an injection in the right knee on October 2017, we gave her a cortisone injection today to give her relief. She does have osteoarthritis of bilateral knee joint with the right knee hurting more than the left knee. She will be eligible for Visco supplementation for both knees 6 months after her September 2017 injection.  #3: Apply for Euflexxa of bilateral knees 3. Injections can begin mid March/late March 2018. Message sent to S. Caudle for prior auth.  #4: Return to clinic in 6 months  #5: I encouraged the patient to continue her efforts at weight loss, continue exercising. Patient can discuss with her doctor if she can take Voltaren gel. Patient is not allowed to take NSAIDs since she is on blood thinners.  Orders: Orders Placed This Encounter  Procedures  . Large Joint Injection/Arthrocentesis  . Large Joint Injection/Arthrocentesis   No orders of the defined types were placed in this encounter.   Face-to-face time spent with patient was 30 minutes. 50% of time was spent in counseling and coordination of care.  Follow-Up Instructions: Return in about 6 months (around 07/09/2016) for Onnie Graham, DDD C-L Spine; .   Eliezer Lofts, PA-C

## 2016-01-09 ENCOUNTER — Ambulatory Visit (INDEPENDENT_AMBULATORY_CARE_PROVIDER_SITE_OTHER): Payer: Medicare Other | Admitting: Rheumatology

## 2016-01-09 ENCOUNTER — Encounter: Payer: Self-pay | Admitting: Rheumatology

## 2016-01-09 VITALS — BP 138/84 | HR 78 | Resp 18 | Ht 67.0 in | Wt 310.0 lb

## 2016-01-09 DIAGNOSIS — M1711 Unilateral primary osteoarthritis, right knee: Secondary | ICD-10-CM | POA: Diagnosis not present

## 2016-01-09 DIAGNOSIS — M19072 Primary osteoarthritis, left ankle and foot: Secondary | ICD-10-CM | POA: Diagnosis not present

## 2016-01-09 DIAGNOSIS — M19042 Primary osteoarthritis, left hand: Secondary | ICD-10-CM | POA: Diagnosis not present

## 2016-01-09 DIAGNOSIS — M170 Bilateral primary osteoarthritis of knee: Secondary | ICD-10-CM

## 2016-01-09 DIAGNOSIS — M25561 Pain in right knee: Secondary | ICD-10-CM | POA: Diagnosis not present

## 2016-01-09 DIAGNOSIS — M19071 Primary osteoarthritis, right ankle and foot: Secondary | ICD-10-CM

## 2016-01-09 DIAGNOSIS — M5030 Other cervical disc degeneration, unspecified cervical region: Secondary | ICD-10-CM | POA: Diagnosis not present

## 2016-01-09 DIAGNOSIS — M19041 Primary osteoarthritis, right hand: Secondary | ICD-10-CM

## 2016-01-09 DIAGNOSIS — M47816 Spondylosis without myelopathy or radiculopathy, lumbar region: Secondary | ICD-10-CM | POA: Diagnosis not present

## 2016-01-09 DIAGNOSIS — M7062 Trochanteric bursitis, left hip: Secondary | ICD-10-CM

## 2016-01-09 DIAGNOSIS — G8929 Other chronic pain: Secondary | ICD-10-CM

## 2016-01-09 DIAGNOSIS — M47812 Spondylosis without myelopathy or radiculopathy, cervical region: Secondary | ICD-10-CM

## 2016-01-09 MED ORDER — LIDOCAINE HCL 1 % IJ SOLN
1.5000 mL | INTRAMUSCULAR | Status: AC | PRN
  Administered 2016-01-09: 1.5 mL

## 2016-01-09 MED ORDER — TRIAMCINOLONE ACETONIDE 40 MG/ML IJ SUSP
40.0000 mg | INTRAMUSCULAR | Status: AC | PRN
  Administered 2016-01-09: 40 mg via INTRA_ARTICULAR

## 2016-01-15 ENCOUNTER — Ambulatory Visit (INDEPENDENT_AMBULATORY_CARE_PROVIDER_SITE_OTHER): Payer: Medicare Other | Admitting: Family Medicine

## 2016-01-15 ENCOUNTER — Encounter: Payer: Self-pay | Admitting: Family Medicine

## 2016-01-15 VITALS — BP 125/64 | HR 94 | Temp 98.8°F | Ht 67.0 in | Wt 308.6 lb

## 2016-01-15 DIAGNOSIS — M67441 Ganglion, right hand: Secondary | ICD-10-CM

## 2016-01-15 DIAGNOSIS — M1710 Unilateral primary osteoarthritis, unspecified knee: Secondary | ICD-10-CM

## 2016-01-15 DIAGNOSIS — R05 Cough: Secondary | ICD-10-CM | POA: Diagnosis not present

## 2016-01-15 DIAGNOSIS — Z862 Personal history of diseases of the blood and blood-forming organs and certain disorders involving the immune mechanism: Secondary | ICD-10-CM

## 2016-01-15 DIAGNOSIS — J3089 Other allergic rhinitis: Secondary | ICD-10-CM

## 2016-01-15 DIAGNOSIS — S8991XA Unspecified injury of right lower leg, initial encounter: Secondary | ICD-10-CM | POA: Diagnosis not present

## 2016-01-15 DIAGNOSIS — R059 Cough, unspecified: Secondary | ICD-10-CM

## 2016-01-15 NOTE — Progress Notes (Signed)
Subjective:    Patient ID: [name], female    DOB: [birth year], 66 y.o.   MRN: [id number] Chief Complaint  Patient presents with  . Follow-up    3 month follow up  . Knee Pain    X 1 day right knee    HPI  [name] is a delightful 66 year old woman who is here for a 3 month follow-up after her Medicare wellness visit in October. She was a prior patient of Dr. Josepha Pigg and so I am still getting acquainted with her medical history.  1. Obesity 2. HTN:  On lisinopril-hctz 20-25 stable for several years. 3.  HPL: Elev trig at 269 last yr, LDL 90, non-hdl 144. On lipitor 40, niacin 571m qam and fish oil 4.  OSA: On cpap and tolerating well.  Is followed by Dr. DRoxy Horsemanannually. 5.  On xarelto - for h/o DVT in 2008 followed by Dr. NJohnsie Cancelannually 6.   H/o IDA - has pt stop her iron supp 3 mos piror and see what happens 7.   H/o vit D def 8.  OA/mobiity issues - uses orthotic shoes, gets regular Euflexxa 20 mg inj into her knees every 6 mos.. Is going to Dr. DKeith Rakefor evalatuion.  "Everything hurts".  We referred her to [facility] since they ghave a full range of services including aqua therapy primarily for Bilateral greater trochanter bursitis, low back pain, and lower extremity strengthening. She started going to [facility] 2x/wk and [facility]  She also did get a deep tissue massage helped release. Going to get more exercises and do them on her own. Saw EHarmon Pier  Was seen by Dr. DTommie RaymondPA - got cortisone inj into left hip and right knee and is going to start the Euflexxa there. Her shoe got caught on her rug yesterdya and she developed a shooting pain in the back of her knee which is radiating arround to the front.  She has a knee brace at home from prior and a cane.    Leukopenia: constant since 2005 when she underwent chemo for ovarianc cancer. Still has annual CA-125 by Dr. MWilleen Cass  gyn.  Post-nasal drip: takes cetirizine daily but still having a lot of sxs.  No nasal sprays. Keeps zyrtec on all the time. Having year round PND at certain times of the year it does get worse with allergies/hay fever symptoms.  THerefore we stepped up her thrapy to xyzal and flonase rather than the zytec at last visit. Is having trouble from her wisdom tooth.  No GERD  Stomach pain: wonders if she had sensitive areas in her bowels that are intermittent and wants to cont watchful waiting. Ongoing but minitmal and not bothering her to much  Cough from lisinopril intermittently. Has tried other BP meds and was unable to tolerate them. Does not want to change her med.  Cough has resolved as pt thinks it is from her post-nasal drip.  Her cough is spasmodic - episodes of dry/irritated throat but does not go on throughout the day.  The flonase has not helped so will stop.  The cough just happens in little bursts so takin the tessalong (which she only tried once) does not help as by the time it gets in her system her cough will have already resolved.  She did not have to take the amox she was prscriberd as her URI last mo resolved.  Shge wonders if it  could be a food allergy.    Leg swelling around her ankles - Had a h/o DVT in 2008 in left leg. Typical lipedema pattern with feet sparing. Thinks that poss muscle atropathy might be exacerbating.   Depression screen [facility] 2/9 01/15/2016 12/29/2015 10/16/2015 10/10/2014 07/13/2014  Decreased Interest 0 0 0 0 0  Down, Depressed, Hopeless 0 0 0 0 0  PHQ - 2 Score 0 0 0 0 0   Past Medical History:  Diagnosis Date  . Anemia    oral iron occ.  . Basal cell carcinoma of nose   . Bronchitis   . 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 Diverticulitis    past history  . DVT (deep vein thrombosis) in pregnancy ([place])    LEFT LEG  . 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 HTN  (hypertension)   . Hyperlipidemia   . OSA (obstructive sleep apnea)    hypoventilation, on CPAP  . Shingles    Past Surgical History:  Procedure Laterality Date  . BASAL CELL CARCINOMA EXCISION     resection with plastic surgery reconstrustion  . bilateral breast mass  1970   benigned, left breast  . cataract surgery Bilateral [id number], 2005   with bilateral lens replacements  . COLONOSCOPY WITH PROPOFOL N/A 05/21/2014   Procedure: COLONOSCOPY WITH PROPOFOL;  Surgeon: Juanita Craver, MD;  Location: [facility];  Service: Endoscopy;  Laterality: N/A;  . HERNIA REPAIR  6803   umbilical repair with mesh   . TOTAL ABDOMINAL HYSTERECTOMY W/ BILATERAL SALPINGOOPHORECTOMY  2005   Stage IA ovarian cancer   Current Outpatient Prescriptions on File Prior to Visit  Medication Sig Dispense Refill  . atorvastatin (LIPITOR) 40 MG tablet Take 1 tablet (40 mg total) by mouth daily. 90 tablet 3  . cetirizine (ZYRTEC) 5 MG tablet Take 5 mg by mouth daily.    . fish oil-omega-3 fatty acids 1000 MG capsule Take 1-2 g by mouth 2 (two) times daily.     Marland Kitchen lisinopril-hydrochlorothiazide (PRINZIDE,ZESTORETIC) 20-25 MG tablet Take 1 tablet by mouth daily. 90 tablet 3  . Multiple Vitamin (MULTIVITAMIN) tablet Take 1 tablet by mouth every morning.     . niacin 500 MG tablet Take 500 mg by mouth every evening.     [name] 20 MG TABS tablet TAKE 1 TABLET (20 MG TOTAL) BY MOUTH DAILY. 90 tablet 3   No current facility-administered medications on file prior to visit.    Allergies  Allergen Reactions  . Valtrex [Valacyclovir Hcl] Swelling and Rash    Swelling of face and arm, rash  . Benoxinate Base     Turns eyes blood red/burning. --in eyedrops--  . Sulfonamide Derivatives   . Ivp Dye [Iodinated Diagnostic Agents] Rash    Rash on back   Family History  Problem Relation Age of Onset  . Alzheimer's disease Mother   . Hypertension Mother   . Alzheimer's disease Father   . Hypertension Father   . Colon  cancer Father     43  . Hypertension Sister   . Breast cancer Sister   . Hypertension Brother    Social History   Social History  . Marital status: Single    Spouse name: N/A  . Number of children: 0  . Years of education: masters   Occupational History  . 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 Smoking status: Never Smoker  . Smokeless tobacco: Never Used     Comment: only for 6 months in the 70s  . Alcohol use 0.0 oz/week     Comment: rare, once per month  . Drug use: No  . Sexual activity: No   Other Topics Concern  . 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 PSH showed an AHI of 120!!!! titrated to 8 cm watr cPAP , residual AHI of 1 and downlaod was done  01-23-11 .  excellent compliance - 7 hours and low leak,  nasal pillow mask.    BMI is considered morbidly obese.  discussed low carb  diet - weight watchers and restricted exercise. , aquatic .    Patient cannot exercise due to soreness in proximal muscles.and  would like to add coenzyme q 10 and carnitine . She needs a medical weight loss regimen - bariatric surgery?       FSS 48, Epworth 4 again ,  CMS compliant  residual AHI 1.1 and average use 6.48  hours , no naps    Patient is single and lives alone.   Patient does not have any children.   Patient is retired.   Patient has a Master's degree.   Patient is left-handed.   Patient drinks tea occasionally.              Review of Systems See hpi    Objective:   Physical Exam  Constitutional: She is oriented to person, place, and time. She appears well-developed and well-nourished. No distress.  HENT:  Head: Normocephalic and atraumatic.  Right Ear: External ear normal.  Left Ear: External ear normal.  Eyes: Conjunctivae are normal. No scleral icterus.  Neck: Normal range of motion. Neck supple. No thyromegaly present.  Cardiovascular: Normal rate, regular  rhythm, normal heart sounds and intact distal pulses.   Pulmonary/Chest: Effort normal and breath sounds normal. No respiratory distress.  Musculoskeletal: She exhibits no edema.  Lymphadenopathy:    She has no cervical adenopathy.  Neurological: She is alert and oriented to person, place, and time.  Skin: Skin is warm and dry. She is not diaphoretic. No erythema.  Psychiatric: She has a normal mood and affect. Her behavior is normal.      BP 125/64 (BP Location: Right Arm, Patient Position: Sitting, Cuff Size: Large)   Pulse 94   Temp 98.8 F (37.1 C) (Oral)   Ht _0  (1.702 m)   Wt (!) 308 lb 9.6 oz (140 kg)   LMP 01/12/2003   SpO2 97%   BMI 48.33 kg/m      Assessment & Plan:   1. Chronic nonseasonal allergic rhinitis due to other allergen   2. Cough   3. Knee injury, right, initial encounter   4. Arthritis of knee   5. History of iron deficiency anemia   6. Ganglion cyst of joint of finger of right hand      Delman Cheadle, M.D.  [facility] [address] [place], [place] [address] [phone] phone [phone] fax  02/13/16 2:11 AM

## 2016-01-15 NOTE — Patient Instructions (Addendum)
IF you received an x-ray today, you will receive an invoice from [facility]. Please contact [facility] at [phone] with questions or concerns regarding your invoice.   IF you received labwork today, you will receive an invoice from [facility]. Please contact [facility] at [phone] with questions or concerns regarding your invoice.   Our billing staff will not be able to assist you with questions regarding bills from these companies.  You will be contacted with the lab results as soon as they are available. The fastest way to get your results is to activate your My Chart account. Instructions are located on the last page of this paperwork. If you have not heard from [place] regarding the results in 2 weeks, please contact this office.      Ganglion Cyst Introduction A ganglion cyst is a noncancerous, fluid-filled lump that occurs near joints or tendons. The ganglion cyst grows out of a joint or the lining of a tendon. It most often develops in the hand or wrist, but it can also develop in the shoulder, elbow, hip, knee, ankle, or foot. The round or oval ganglion cyst can be the size of a pea or larger than a grape. Increased activity may enlarge the size of the cyst because more fluid starts to build up. What are the causes? It is not known what causes a ganglion cyst to grow. However, it may be related to:  Inflammation or irritation around the joint.  An injury.  Repetitive movements or overuse.  Arthritis. What increases the risk? Risk factors include:  Being a woman.  Being age 30-50. What are the signs or symptoms? Symptoms may include:  A lump. This most often appears on the hand or wrist, but it can occur in other areas of the body.  Tingling.  Pain.  Numbness.  Muscle weakness.  Weak grip.  Less movement in a joint. How is this diagnosed? Ganglion cysts are most often diagnosed based on a physical exam. Your health care provider will  feel the lump and may shine a light alongside it. If it is a ganglion cyst, a light often shines through it. Your health care provider may order an X-ray, ultrasound, or MRI to rule out other conditions. How is this treated? Ganglion cysts usually go away on their own without treatment. If pain or other symptoms are involved, treatment may be needed. Treatment is also needed if the ganglion cyst limits your movement or if it gets infected. Treatment may include:  Wearing a brace or splint on your wrist or finger.  Taking anti-inflammatory medicine.  Draining fluid from the lump with a needle (aspiration).  Injecting a steroid into the joint.  Surgery to remove the ganglion cyst. Follow these instructions at home:  Do not press on the ganglion cyst, poke it with a needle, or hit it.  Take medicines only as directed by your health care provider.  Wear your brace or splint as directed by your health care provider.  Watch your ganglion cyst for any changes.  Keep all follow-up visits as directed by your health care provider. This is important. Contact a health care provider if:  Your ganglion cyst becomes larger or more painful.  You have increased redness, red streaks, or swelling.  You have pus coming from the lump.  You have weakness or numbness in the affected area.  You have a fever or chills. This information is not intended to replace advice given to you by your health care  provider. Make sure you discuss any questions you have with your health care provider. Document Released: 12/26/1999 Document Revised: 06/05/2015 Document Reviewed: 06/12/2013  2017 Elsevier Knee Effusion Introduction Knee effusion means that you have excess fluid in your knee joint. This can cause pain and swelling in your knee. This may make your knee more difficult to bend and move. That is because there is increased pain and pressure in the joint. If there is fluid in your knee, it often means that  something is wrong inside your knee, such as severe arthritis, abnormal inflammation, or an infection. Another common cause of knee effusion is an injury to the knee muscles, ligaments, or cartilage. Follow these instructions at home:  Use crutches as directed by your health care provider.  Wear a knee brace as directed by your health care provider.  Apply ice to the swollen area:  Put ice in a plastic bag.  Place a towel between your skin and the bag.  Leave the ice on for 20 minutes, 2-3 times per day.  Keep your knee raised (elevated) when you are sitting or lying down.  Take medicines only as directed by your health care provider.  Do any rehabilitation or strengthening exercises as directed by your health care provider.  Rest your knee as directed by your health care provider. You may start doing your normal activities again when your health care provider approves.  Keep all follow-up visits as directed by your health care provider. This is important. Contact a health care provider if:  You have ongoing (persistent) pain in your knee. Get help right away if:  You have increased swelling or redness of your knee.  You have severe pain in your knee.  You have a fever. This information is not intended to replace advice given to you by your health care provider. Make sure you discuss any questions you have with your health care provider. Document Released: 03/20/2003 Document Revised: 06/05/2015 Document Reviewed: 08/13/2013  2017 Elsevier

## 2016-01-20 ENCOUNTER — Telehealth (INDEPENDENT_AMBULATORY_CARE_PROVIDER_SITE_OTHER): Payer: Self-pay | Admitting: Rheumatology

## 2016-01-20 NOTE — Telephone Encounter (Signed)
Patient called to verify that her euflexxa shots have been ordered.  Cb#: [phone]

## 2016-02-25 ENCOUNTER — Telehealth: Payer: Self-pay | Admitting: Rheumatology

## 2016-02-25 NOTE — Telephone Encounter (Signed)
Patient called and wanted to let you know that she would like to start the Euflexxa injection process.  Cb#[phone].  Thank you.

## 2016-03-04 NOTE — Telephone Encounter (Signed)
Patient called and stated that she has not heard from the pharmacy for her Euflexxa injection.  She is wanting to know if the appointments can be set up now.

## 2016-03-08 NOTE — Telephone Encounter (Signed)
Patient called regarding her Euflexxa.  Cb#[phone].  Thank you.

## 2016-03-10 ENCOUNTER — Telehealth: Payer: Self-pay | Admitting: Rheumatology

## 2016-03-10 NOTE — Telephone Encounter (Signed)
Letta Median from [facility] returned Sharon's call on patient. Please call her back at [phone]

## 2016-03-11 NOTE — Telephone Encounter (Signed)
[name] left a message yesterday and is wanting to confirm the Euflexxa shipment for [employer]  Their [phone].  Thank you.

## 2016-03-11 NOTE — Telephone Encounter (Signed)
Please call patient and schedule Euflexxa inj x 3 with Gilmer Mor on Monday, Wednesday or Friday afternoons, Bilateral, patient purchase, Euflexxa should be delivered to office 03/15/16.

## 2016-03-12 NOTE — Telephone Encounter (Signed)
Patient is scheduled   

## 2016-03-16 ENCOUNTER — Telehealth (INDEPENDENT_AMBULATORY_CARE_PROVIDER_SITE_OTHER): Payer: Self-pay | Admitting: *Deleted

## 2016-03-16 NOTE — Telephone Encounter (Signed)
-----   Message from Eliezer Lofts, [name] sent at 01/09/2016  2:52 PM EST ----- Regarding: Apply for Euflexxa of bilateral knees 3.  Apply for Euflexxa of bilateral knees 3.  Injections can begin mid March/late March 2018.

## 2016-03-16 NOTE — Telephone Encounter (Signed)
Euflexxa delivered to office 03/15/16, bilateral, patient purchased, patient scheduled.

## 2016-03-17 ENCOUNTER — Ambulatory Visit (INDEPENDENT_AMBULATORY_CARE_PROVIDER_SITE_OTHER): Payer: Medicare Other | Admitting: Rheumatology

## 2016-03-17 DIAGNOSIS — M1711 Unilateral primary osteoarthritis, right knee: Secondary | ICD-10-CM | POA: Diagnosis not present

## 2016-03-17 DIAGNOSIS — G8929 Other chronic pain: Secondary | ICD-10-CM

## 2016-03-17 DIAGNOSIS — M1712 Unilateral primary osteoarthritis, left knee: Secondary | ICD-10-CM

## 2016-03-17 DIAGNOSIS — M25562 Pain in left knee: Principal | ICD-10-CM

## 2016-03-17 DIAGNOSIS — M25561 Pain in right knee: Principal | ICD-10-CM

## 2016-03-17 MED ORDER — LIDOCAINE HCL 1 % IJ SOLN
1.5000 mL | INTRAMUSCULAR | Status: AC | PRN
  Administered 2016-03-17: 1.5 mL

## 2016-03-17 MED ORDER — SODIUM HYALURONATE (VISCOSUP) 20 MG/2ML IX SOSY
20.0000 mg | PREFILLED_SYRINGE | INTRA_ARTICULAR | Status: AC | PRN
  Administered 2016-03-17: 20 mg via INTRA_ARTICULAR

## 2016-03-17 NOTE — Progress Notes (Signed)
   Procedure Note  Patient: [name]             Date of Birth: [birth year]           MRN: [id number]             Visit Date: 03/17/2016  Procedures: Visit Diagnoses: No diagnosis found.   Note: Patient has a scheduling conflict. She presented today for injections to both knees on Wednesday. She is requesting at the next injection be done on Fridays on March 16 and March 23. This is acceptable and we've made appointments accordingly.  Large Joint Inj Date/Time: 03/17/2016 11:14 AM Performed by: Eliezer Lofts Authorized by: Eliezer Lofts   Consent Given by:  Patient Site marked: the procedure site was marked   Timeout: prior to procedure the correct patient, procedure, and site was verified   Indications:  Pain and joint swelling Location:  Knee Site:  R knee Prep: patient was prepped and draped in usual sterile fashion   Needle Size:  27 G Needle Length:  1.5 inches Approach:  Medial Ultrasound Guidance: No   Fluoroscopic Guidance: No   Arthrogram: No   Medications:  1.5 mL lidocaine 1 %; 20 mg Sodium Hyaluronate 20 MG/2ML Aspiration Attempted: Yes   Patient tolerance:  Patient tolerated the procedure well with no immediate complications  Euflex #1 to  both knees Large Joint Inj Date/Time: 03/17/2016 11:14 AM Performed by: Eliezer Lofts Authorized by: Eliezer Lofts   Consent Given by:  Patient Site marked: the procedure site was marked   Timeout: prior to procedure the correct patient, procedure, and site was verified   Indications:  Pain and joint swelling Location:  Knee Site:  L knee Prep: patient was prepped and draped in usual sterile fashion   Needle Size:  27 G Needle Length:  1.5 inches Approach:  Medial Ultrasound Guidance: No   Fluoroscopic Guidance: No   Arthrogram: No   Medications:  1.5 mL lidocaine 1 %; 20 mg Sodium Hyaluronate 20 MG/2ML Aspiration Attempted: Yes   Patient tolerance:  Patient tolerated the procedure well with no  immediate complications  Euflex #1 to  both knees

## 2016-03-26 ENCOUNTER — Ambulatory Visit (INDEPENDENT_AMBULATORY_CARE_PROVIDER_SITE_OTHER): Payer: Medicare Other | Admitting: Rheumatology

## 2016-03-26 DIAGNOSIS — M1711 Unilateral primary osteoarthritis, right knee: Secondary | ICD-10-CM

## 2016-03-26 DIAGNOSIS — M25562 Pain in left knee: Secondary | ICD-10-CM

## 2016-03-26 DIAGNOSIS — G8929 Other chronic pain: Secondary | ICD-10-CM | POA: Diagnosis not present

## 2016-03-26 DIAGNOSIS — M170 Bilateral primary osteoarthritis of knee: Secondary | ICD-10-CM

## 2016-03-26 DIAGNOSIS — M25561 Pain in right knee: Secondary | ICD-10-CM

## 2016-03-26 DIAGNOSIS — M1712 Unilateral primary osteoarthritis, left knee: Secondary | ICD-10-CM

## 2016-03-26 MED ORDER — LIDOCAINE HCL 1 % IJ SOLN
1.5000 mL | INTRAMUSCULAR | Status: AC | PRN
  Administered 2016-03-26: 1.5 mL

## 2016-03-26 MED ORDER — SODIUM HYALURONATE (VISCOSUP) 20 MG/2ML IX SOSY
20.0000 mg | PREFILLED_SYRINGE | INTRA_ARTICULAR | Status: AC | PRN
  Administered 2016-03-26: 20 mg via INTRA_ARTICULAR

## 2016-03-26 NOTE — Progress Notes (Signed)
   Procedure Note  Patient: [name]             Date of Birth: 1950/08/29           MRN: [id number]             Visit Date: 03/26/2016  Procedures: Visit Diagnoses: Primary osteoarthritis of both knees  Chronic pain of both knees  Large Joint Inj Date/Time: 03/26/2016 10:06 AM Performed by: Eliezer Lofts Authorized by: Eliezer Lofts   Consent Given by:  Patient Site marked: the procedure site was marked   Timeout: prior to procedure the correct patient, procedure, and site was verified   Indications:  Pain and joint swelling Location:  Knee Site:  R knee Prep: patient was prepped and draped in usual sterile fashion   Needle Size:  27 G Needle Length:  1.5 inches Approach:  Medial Ultrasound Guidance: No   Fluoroscopic Guidance: No   Arthrogram: No   Medications:  20 mg Sodium Hyaluronate 20 MG/2ML; 1.5 mL lidocaine 1 % Aspiration Attempted: Yes   Patient tolerance:  Patient tolerated the procedure well with no immediate complications  Euflex injection to bilateral knees. Second injection in the series. Return to clinic in 1 week for Euflex and #3 bilateral knees  Large Joint Inj Date/Time: 03/26/2016 12:51 PM Performed by: Eliezer Lofts Authorized by: Eliezer Lofts   Consent Given by:  Patient Site marked: the procedure site was marked   Timeout: prior to procedure the correct patient, procedure, and site was verified   Indications:  Pain and joint swelling Location:  Knee Site:  L knee Prep: patient was prepped and draped in usual sterile fashion   Needle Size:  27 G Needle Length:  1.5 inches Approach:  Medial Ultrasound Guidance: No   Fluoroscopic Guidance: No   Arthrogram: No   Medications:  1.5 mL lidocaine 1 %; 20 mg Sodium Hyaluronate 20 MG/2ML Aspiration Attempted: Yes   Patient tolerance:  Patient tolerated the procedure well with no immediate complications   Return to clinic in 1 week for Euflex #3 bilateral knees

## 2016-04-02 ENCOUNTER — Ambulatory Visit (INDEPENDENT_AMBULATORY_CARE_PROVIDER_SITE_OTHER): Payer: Medicare Other | Admitting: Rheumatology

## 2016-04-02 DIAGNOSIS — M25561 Pain in right knee: Secondary | ICD-10-CM

## 2016-04-02 DIAGNOSIS — M25562 Pain in left knee: Secondary | ICD-10-CM

## 2016-04-02 DIAGNOSIS — M170 Bilateral primary osteoarthritis of knee: Secondary | ICD-10-CM

## 2016-04-02 DIAGNOSIS — G8929 Other chronic pain: Secondary | ICD-10-CM

## 2016-04-02 MED ORDER — LIDOCAINE HCL 1 % IJ SOLN
1.5000 mL | INTRAMUSCULAR | Status: AC | PRN
  Administered 2016-04-02: 1.5 mL

## 2016-04-02 MED ORDER — SODIUM HYALURONATE (VISCOSUP) 20 MG/2ML IX SOSY
20.0000 mg | PREFILLED_SYRINGE | INTRA_ARTICULAR | Status: AC | PRN
  Administered 2016-04-02: 20 mg via INTRA_ARTICULAR

## 2016-04-02 NOTE — Progress Notes (Signed)
   Procedure Note  Patient: [name]             Date of Birth: [birth year]           MRN: [id number]             Visit Date: 04/02/2016  Procedures: Visit Diagnoses: No diagnosis found.  Large Joint Inj Date/Time: 04/02/2016 1:03 PM Performed by: Eliezer Lofts Authorized by: Eliezer Lofts   Consent Given by:  Patient Site marked: the procedure site was marked   Timeout: prior to procedure the correct patient, procedure, and site was verified   Indications:  Pain and joint swelling Location:  Knee Site:  R knee Prep: patient was prepped and draped in usual sterile fashion   Needle Size:  27 G Needle Length:  1.5 inches Approach:  Medial Ultrasound Guidance: No   Fluoroscopic Guidance: No   Arthrogram: No   Medications:  1.5 mL lidocaine 1 %; 20 mg Sodium Hyaluronate 20 MG/2ML Aspiration Attempted: Yes   Patient tolerance:  Patient tolerated the procedure well with no immediate complications Large Joint Inj Date/Time: 04/02/2016 1:04 PM Performed by: Eliezer Lofts Authorized by: Eliezer Lofts   Consent Given by:  Patient Site marked: the procedure site was marked   Timeout: prior to procedure the correct patient, procedure, and site was verified   Indications:  Pain and joint swelling Location:  Knee Site:  L knee Prep: patient was prepped and draped in usual sterile fashion   Needle Size:  27 G Needle Length:  1.5 inches Approach:  Medial Ultrasound Guidance: No   Fluoroscopic Guidance: No   Arthrogram: No   Medications:  1.5 mL lidocaine 1 %; 20 mg Sodium Hyaluronate 20 MG/2ML Aspiration Attempted: Yes   Patient tolerance:  Patient tolerated the procedure well with no immediate complications  Today, patient is receiving her third injection of Euflexxa to bilateral knees. Initially after the first injection, she was having some pain and discomfort. Patient did really well in the past with these injections and was having some ongoing discomfort to the  knees.  She discovered that she had not been swimming and when she restarted swimming, she started having left knee joint pain. Today she states that her knee pain ever since the second injection has resolved.  Repeat Euflexxa 3 bilateral knees in 6 months from today

## 2016-04-20 DIAGNOSIS — M19071 Primary osteoarthritis, right ankle and foot: Secondary | ICD-10-CM | POA: Insufficient documentation

## 2016-04-20 DIAGNOSIS — M19072 Primary osteoarthritis, left ankle and foot: Secondary | ICD-10-CM

## 2016-04-20 DIAGNOSIS — E559 Vitamin D deficiency, unspecified: Secondary | ICD-10-CM | POA: Insufficient documentation

## 2016-04-20 DIAGNOSIS — M160 Bilateral primary osteoarthritis of hip: Secondary | ICD-10-CM | POA: Insufficient documentation

## 2016-04-20 DIAGNOSIS — M19042 Primary osteoarthritis, left hand: Secondary | ICD-10-CM

## 2016-04-20 DIAGNOSIS — M170 Bilateral primary osteoarthritis of knee: Secondary | ICD-10-CM | POA: Insufficient documentation

## 2016-04-20 DIAGNOSIS — M19041 Primary osteoarthritis, right hand: Secondary | ICD-10-CM | POA: Insufficient documentation

## 2016-04-20 NOTE — Progress Notes (Signed)
Office Visit Note  Patient: [name]             Date of Birth: May 17, 1950           MRN: [id number]             PCP: Delman Cheadle, MD Referring: Shawnee Knapp, MD Visit Date: 04/29/2016 Occupation: '@GUAROCC' @    Subjective:  Follow-up   History of Present Illness: [name] is a 66 y.o. female   last visit 01/09/2016  Request left great trochanter bursa injection today  Also has right 1st digit  With cyst/abcess and would like dr. D to drain it today.    Activities of Daily Living:  Patient reports morning stiffness for 10 minutes.   Patient Reports nocturnal pain.  Difficulty dressing/grooming: Denies Difficulty climbing stairs: Reports Difficulty getting out of chair: Reports Difficulty using hands for taps, buttons, cutlery, and/or writing: Denies   Review of Systems  Constitutional: Negative for fatigue.  HENT: Negative for mouth sores and mouth dryness.   Eyes: Negative for dryness.  Respiratory: Negative for shortness of breath.   Gastrointestinal: Negative for constipation and diarrhea.  Musculoskeletal: Negative for myalgias and myalgias.  Skin: Negative for sensitivity to sunlight.  Psychiatric/Behavioral: Negative for decreased concentration and sleep disturbance.    PMFS History:  Patient Active Problem List   Diagnosis Date Noted  . Primary osteoarthritis of both knees 04/20/2016  . Primary osteoarthritis of both hands 04/20/2016  . Primary osteoarthritis of both feet 04/20/2016  . Primary osteoarthritis of both hips 04/20/2016  . Vitamin D deficiency 04/20/2016  . Hyperlipidemia 10/15/2015  . Anemia 10/15/2015  . Sleep apnea with use of continuous positive airway pressure (CPAP) 07/22/2014  . OSA on CPAP 07/04/2013  . Obesity, morbid ([place]) 07/04/2013  . Morbid obesity ([place]) 11/24/2011  . Arthritis of knee 11/24/2011  . Obese 12/04/2010  . Sleep apnea 12/04/2010  . ELECTROCARDIOGRAM, ABNORMAL 06/02/2009  . Acute thromboembolism of  deep veins of lower extremity ([place]) 03/26/2008  . EDEMA 03/26/2008  . ADENOCARCINOMA, OVARY 03/25/2008  . ESSENTIAL HYPERTENSION, BENIGN 03/25/2008    Past Medical History:  Diagnosis Date  . Anemia    oral iron occ.  . Basal cell carcinoma of nose   . Bronchitis   . Cancer of ovary ([place]) 2005   Stage 1A, BRCA 1/2 neg 6/14  . Diverticulitis    past history  . DVT (deep vein thrombosis) in pregnancy ([place])    LEFT LEG  . Femoral DVT (deep venous thrombosis) ([place]) 2008   -"wears compression hose all times" left leg  . HTN (hypertension)   . Hyperlipidemia   . OSA (obstructive sleep apnea)    hypoventilation, on CPAP  . Shingles     Family History  Problem Relation Age of Onset  . Alzheimer's disease Mother   . Hypertension Mother   . Alzheimer's disease Father   . Hypertension Father   . Colon cancer Father     28  . Hypertension Sister   . Breast cancer Sister   . Hypertension Brother    Past Surgical History:  Procedure Laterality Date  . BASAL CELL CARCINOMA EXCISION     resection with plastic surgery reconstrustion  . bilateral breast mass  1970   benigned, left breast  . cataract surgery Bilateral 2725,3664, 2005   with bilateral lens replacements  . COLONOSCOPY WITH PROPOFOL N/A 05/21/2014   Procedure: COLONOSCOPY WITH PROPOFOL;  Surgeon: Juanita Craver, MD;  Location: [facility];  Service: Endoscopy;  Laterality: N/A;  . HERNIA REPAIR  [id number]   umbilical repair with mesh   . 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 PSH showed an AHI of 120!!!! titrated to 8 cm watr cPAP , residual AHI of 1 and downlaod was done  01-23-11 .  excellent compliance - 7 hours and low leak,  nasal pillow mask.    BMI is considered morbidly obese.  discussed low carb  diet - weight watchers and restricted exercise. , aquatic .      Patient cannot exercise due to soreness in proximal muscles.and  would like to add coenzyme q 10 and carnitine . She needs a medical weight loss regimen - bariatric surgery?       FSS 48, Epworth 4 again ,  CMS compliant  residual AHI 1.1 and average use 6.48  hours , no naps    Patient is single and lives alone.   Patient does not have any children.   Patient is retired.   Patient has a Master's degree.   Patient is left-handed.   Patient drinks tea occasionally.               Objective: Vital Signs: BP 128/80   Pulse 72   Resp 14   Ht '5\' 7"'  (1.702 m)   Wt (!) 318 lb (144.2 kg)   LMP 01/12/2003   BMI 49.81 kg/m    Physical Exam  Constitutional: She is oriented to person, place, and time. She appears well-developed and well-nourished.  HENT:  Head: Normocephalic and atraumatic.  Eyes: EOM are normal. Pupils are equal, round, and reactive to light.  Cardiovascular: Normal rate, regular rhythm and normal heart sounds.  Exam reveals no gallop and no friction rub.   No murmur heard. Pulmonary/Chest: Effort normal and breath sounds normal. She has no wheezes. She has no rales.  Abdominal: Soft. Bowel sounds are normal. She exhibits no distension. There is no tenderness. There is no guarding. No hernia.  Musculoskeletal: Normal range of motion. She exhibits no edema, tenderness or deformity.  Lymphadenopathy:    She has no cervical adenopathy.  Neurological: She is alert and oriented to person, place, and time. Coordination normal.  Skin: Skin is warm and dry. Capillary refill takes less than 2 seconds. No rash noted.  Psychiatric: She has a normal mood and affect. Her behavior is normal.  Nursing note and vitals reviewed.    Musculoskeletal Exam:  Full range of motion of all joints Grip strength is equal and strong bilaterally Fiber myalgia tender points are all absent   CDAI Exam: No CDAI exam completed.  No synovitis on examination  Investigation: Findings:   October 2013:  Comprehensive metabolic panel, sed rate, TSH, SPEP, UA, anticardiolipin were normal.  Vitamin D was low at 28.   I have reviewed her previous records.  In April of 2013, MRI of the lumbar spine showed mild disk disease and mild disk bulge without any spinal stenosis.  She has some facet joint arthropathy.  X-ray of the pelvis showed, from March of 2012, SI joints were normal.  She had mild osteoarthritic changes in her hip joints.  X-ray of C-spine from September of 2013 shows significant disk disease at C5-6 and C6-7 levels. 10/15/2011    X-ray of bilateral knee joints were obtained  in the office today.  AP and lateral views showed bilateral moderate to severe medial compartment narrowing and bilateral patellofemoral narrowing consistent with osteoarthritis of the knee joints.     Imaging: No results found.       Speciality Comments: No specialty comments available.    Procedures:  Large Joint Inj Date/Time: 04/29/2016 3:09 PM Performed by: Eliezer Lofts Authorized by: Eliezer Lofts   Consent Given by:  Patient Site marked: the procedure site was marked   Timeout: prior to procedure the correct patient, procedure, and site was verified   Indications:  Pain Location:  Hip Site:  L greater trochanter Prep: patient was prepped and draped in usual sterile fashion   Needle Size:  27 G Approach:  Superior Ultrasound Guidance: No   Fluoroscopic Guidance: No   Arthrogram: No   Medications:  1.5 mL lidocaine 1 %; 40 mg triamcinolone acetonide 40 MG/ML Aspiration Attempted: Yes   Aspirate amount (mL):  0 Patient tolerance:  Patient tolerated the procedure well with no immediate complications  Left greater trochanter bursa was injected without complication with 40 mg of Kenalog mixed with one half mL's 1% lidocaine.  Incision & Drainage Date/Time: 04/29/2016 3:10 PM Performed by: Bo Merino Authorized by: Eliezer Lofts  Type: cyst Body area: upper  extremity Location details: right thumb Anesthesia: local infiltration  Sedation: Patient sedated: no Risk factor: coagulopathy,  underlying major nerve and  underlying major vessel Needle gauge: 18 Incision type: single straight Incision depth: subcutaneous Complexity: simple Drainage: purulent Drainage amount: moderate Wound treatment: wound left open Packing material: none Patient tolerance: Patient tolerated the procedure well with no immediate complications Comments: Mucin cyst drained by Dr. Estanislado Pandy with 18-gauge needle Injected with 10 mg of Kenalog Patient tolerated procedure well. Patient has allergies to our Band-Aids and she brought her on which we used. No drain was needed to be placed. Patient was instructed not to introduce water to that site for 10 days. She does go swimming and she was advised on December for 10 days.    Allergies: Valtrex [valacyclovir hcl]; Benoxinate base; Sulfonamide derivatives; and Ivp dye [iodinated diagnostic agents]   Assessment / Plan:     Visit Diagnoses: Primary osteoarthritis of both knees  Primary osteoarthritis of both hands  Primary osteoarthritis of both feet  Primary osteoarthritis of both hips  Obesity, morbid (HCC)  DJD (degenerative joint disease), cervical  Spondylosis of lumbar region without myelopathy or radiculopathy  OSA on CPAP  History of DVT (deep vein thrombosis)  History of ovarian cancer  Iron deficiency anemia, unspecified iron deficiency anemia type  History of hypertension  Vitamin D deficiency  Digital mucinous cyst - Plan: INCISION AND DRAINAGE  Greater trochanteric bursitis of left hip - Plan: Large Joint Injection/Arthrocentesis, lidocaine (XYLOCAINE) 1 % (with pres) injection 1.5 mL, triamcinolone acetonide (KENALOG-40) injection 40 mg   Plan: #1: Osteoarthritis of bilateral knees.  #2: Left greater trochanter bursa pain. See procedure note for full details.  #3: Right thumb  with lateral PIP joint area with abscess.  #4: Low back pain.  #5: Prescription written for patient. Evaluate and treat. Low back pain, bilateral hip pain, bilateral knee pain, bilateral ankle pain, bilateral feet pain, bilateral hand with weakness.  #6: Mucinous cyst of the right first digit at lateral PIP joint Using sterile procedure, Dr. Estanislado Pandy are lanced the mucin cyst and injected it with 10 mg of Kenalog. There are no complications. Patient does a lot of swimming and was instructed to  avoid swimming x 10 days.  Orders: Orders Placed This Encounter  Procedures  . Large Joint Injection/Arthrocentesis  . INCISION AND DRAINAGE   Meds ordered this encounter  Medications  . lidocaine (XYLOCAINE) 1 % (with pres) injection 1.5 mL  . triamcinolone acetonide (KENALOG-40) injection 40 mg    Face-to-face time spent with patient was 30 minutes. 50% of time was spent in counseling and coordination of care.  Follow-Up Instructions: Return in about 5 months (around 09/29/2016) for [place], left [place] pain & inj, rt mucin cyst drained, knee pain. I examined and evaluated the patient with Eliezer Lofts PA. The plan of care was discussed as noted above.  Bo Merino, MD  Bo Merino, MD  Note - This record has been created using Dragon software.  Chart creation errors have been sought, but may not always  have been located. Such creation errors do not reflect on  the standard of medical care.

## 2016-04-23 ENCOUNTER — Ambulatory Visit: Payer: Medicare Other | Admitting: Rheumatology

## 2016-04-29 ENCOUNTER — Encounter: Payer: Self-pay | Admitting: Rheumatology

## 2016-04-29 ENCOUNTER — Ambulatory Visit (INDEPENDENT_AMBULATORY_CARE_PROVIDER_SITE_OTHER): Payer: Medicare Other | Admitting: Rheumatology

## 2016-04-29 VITALS — BP 128/80 | HR 72 | Resp 14 | Ht 67.0 in | Wt 318.0 lb

## 2016-04-29 DIAGNOSIS — M67449 Ganglion, unspecified hand: Secondary | ICD-10-CM

## 2016-04-29 DIAGNOSIS — Z9989 Dependence on other enabling machines and devices: Secondary | ICD-10-CM

## 2016-04-29 DIAGNOSIS — M19041 Primary osteoarthritis, right hand: Secondary | ICD-10-CM

## 2016-04-29 DIAGNOSIS — M19072 Primary osteoarthritis, left ankle and foot: Secondary | ICD-10-CM

## 2016-04-29 DIAGNOSIS — M5030 Other cervical disc degeneration, unspecified cervical region: Secondary | ICD-10-CM | POA: Diagnosis not present

## 2016-04-29 DIAGNOSIS — M7062 Trochanteric bursitis, left hip: Secondary | ICD-10-CM | POA: Diagnosis not present

## 2016-04-29 DIAGNOSIS — M160 Bilateral primary osteoarthritis of hip: Secondary | ICD-10-CM | POA: Diagnosis not present

## 2016-04-29 DIAGNOSIS — D509 Iron deficiency anemia, unspecified: Secondary | ICD-10-CM

## 2016-04-29 DIAGNOSIS — Z86718 Personal history of other venous thrombosis and embolism: Secondary | ICD-10-CM | POA: Diagnosis not present

## 2016-04-29 DIAGNOSIS — G4733 Obstructive sleep apnea (adult) (pediatric): Secondary | ICD-10-CM | POA: Diagnosis not present

## 2016-04-29 DIAGNOSIS — Z8543 Personal history of malignant neoplasm of ovary: Secondary | ICD-10-CM | POA: Diagnosis not present

## 2016-04-29 DIAGNOSIS — M47816 Spondylosis without myelopathy or radiculopathy, lumbar region: Secondary | ICD-10-CM | POA: Diagnosis not present

## 2016-04-29 DIAGNOSIS — M47812 Spondylosis without myelopathy or radiculopathy, cervical region: Secondary | ICD-10-CM

## 2016-04-29 DIAGNOSIS — M19071 Primary osteoarthritis, right ankle and foot: Secondary | ICD-10-CM

## 2016-04-29 DIAGNOSIS — E559 Vitamin D deficiency, unspecified: Secondary | ICD-10-CM

## 2016-04-29 DIAGNOSIS — M170 Bilateral primary osteoarthritis of knee: Secondary | ICD-10-CM | POA: Diagnosis not present

## 2016-04-29 DIAGNOSIS — Z8679 Personal history of other diseases of the circulatory system: Secondary | ICD-10-CM | POA: Diagnosis not present

## 2016-04-29 DIAGNOSIS — M19042 Primary osteoarthritis, left hand: Secondary | ICD-10-CM

## 2016-04-29 MED ORDER — LIDOCAINE HCL 1 % IJ SOLN
1.5000 mL | INTRAMUSCULAR | Status: AC | PRN
  Administered 2016-04-29: 1.5 mL

## 2016-04-29 MED ORDER — TRIAMCINOLONE ACETONIDE 40 MG/ML IJ SUSP
40.0000 mg | INTRAMUSCULAR | Status: AC | PRN
  Administered 2016-04-29: 40 mg via INTRA_ARTICULAR

## 2016-05-21 ENCOUNTER — Other Ambulatory Visit: Payer: Self-pay | Admitting: Family Medicine

## 2016-05-27 ENCOUNTER — Telehealth: Payer: Self-pay | Admitting: Family Medicine

## 2016-05-27 NOTE — Telephone Encounter (Signed)
Was already sent in. She will have to call the pharmacy to cancel it.

## 2016-05-27 NOTE — Telephone Encounter (Signed)
Pt wanted to call and let you know that she does not need a refill on her Levoxetirizine.

## 2016-05-28 NOTE — Telephone Encounter (Signed)
[facility]

## 2016-07-26 ENCOUNTER — Encounter: Payer: Self-pay | Admitting: Neurology

## 2016-07-27 ENCOUNTER — Ambulatory Visit (INDEPENDENT_AMBULATORY_CARE_PROVIDER_SITE_OTHER): Payer: Medicare Other | Admitting: Neurology

## 2016-07-27 ENCOUNTER — Encounter: Payer: Self-pay | Admitting: Neurology

## 2016-07-27 VITALS — BP 141/78 | HR 84 | Ht 67.0 in | Wt 313.0 lb

## 2016-07-27 DIAGNOSIS — G4733 Obstructive sleep apnea (adult) (pediatric): Secondary | ICD-10-CM

## 2016-07-27 DIAGNOSIS — E662 Morbid (severe) obesity with alveolar hypoventilation: Secondary | ICD-10-CM

## 2016-07-27 NOTE — Progress Notes (Signed)
[facility]  Provider:  Dr Brett Fairy Referring Provider: No ref. provider found Primary Care Physician:  Shawnee Knapp, MD    HPI:  [name] is a 66 y.o. female  here for CPAP compliance,   07/27/16 Routine yearly revisit and this [place] presents again as 100% compliant CPAP user with a rate of 7 hours and 21 minutes. The use of CPAP pressure of 8 cm water is 2 cm in ER, and the total AHI of 0.8. No changes have to be made to have significant air leaks and a very happy with her usage. She endorsed the Epworth sleepiness score at 4 points and the fatigue severity score at 10 points all indicated that CPAP therapy is effective. [name] also wanted to inquire about her travel friendly her CPAP. Her machine was prescribed at the year 2013 nor 2014, and at this time is still working. She is interested in a trouble friendly a light weight machine. This should be her secondary machine however she couldn't keep her current one at home and use a trouble machine for all other purposes. I did show her the model by [employer] and there is a very nice model by [employer]	[place] as well.   Review of Systems:Out of a complete 14 system review, the patient complains of only the following symptoms, and all other reviewed systems are negative.   2014 . [name] ,a 66 year old, [name], right-handed female patient of Dr.Daub's is followed here for a sleep apnea.  The patient underwent a split study on 9-21 2012, after complaining of excessive daytime fatigue, sleepiness, witnessed apneas and fragmented sleep at night she also was easily short of breath. The patient at the past medical history of morbid obesity, hypertension and hyperlipidemia. At the time of her sleep study her BMI was 46.7 and neck circumference 17 inches her blood pressure was also slightly elevated 141/81 mm Hg. The patient had been on Coumadin for DVT at the time. She tested positive for an AHI of 120 and had to  be immediately titrated to a pressure of 9 cm water was reduced for AHI to 0.0 he also was able to enter REM sleep. There was borderline hypoxia noted with hypoventilation and periodic limb movements were also very frequent at night. In February by 2013 she followed up  90 days download- and was using CPAP nightly , had not gained weight and a download revealed a residual AHI on CPAP of 8 cm water of 1.6 . She has meanwhile tried to further was weight especially since he also has had pain and pain due to arthritis. A download from 4-17 -2013 showed an AHI of 1.1, average daily usage of 6 hours 56 minutes set pressure of 8 cm water with a 2 cm EPR. Today her Epworth sleepiness score is 4 points her fatigue score 25 and the patient has continued to use compliantly her CPAP. We were unable to obtain a download in the office today. The patient reports her normal sleep time is from 12:30 midnight to 7:30 AM, she normally falls asleep within 5 minutes. She has no longer nocturia but had nocturia before starting CPAP. She averages about 7 hours of sleep at night. When she wakes up she feels restored, she still takes one nap daily but after lunch about the duration of 30-45 minutes. She reports laughingly that her whole family naps !The nap is perceived as refreshing. Does not recall any dreams or nightmares or any dreams activity sleepwalking  or sleep talking. She does not snore through the machine. She retired since last visit and is free to nap now. She is losing weight, about 6 pounds a month.    Interval history from 07/23/2015. [name] is here today for routine follow-up she reports that over the last 1 or 2 months she has been dreaming more some of his dreams of visit and have a more threatening content but they're not strictly nightmares. Usually she is running from something, fleeing or defending herself. She has no indication that she acted out on dreams. She had originally undergone a sleep study on  September 2012 and was placed on CPAP 9 cm water. She had an AHI at that time of 120. All last summer she has lost weight when she entered the aquatic center. She continues to do this 3 times a week. I am waiting for an in office download.  Patient 100% compliance for days of use and for 4 hours or more of nightly use, with an average of 7 hours and 50 minutes. CPAP is set at 8 cm water with 27 m EPR residual AHI is 0.7. This is an excellent result she also doesn't have major air leaks. No adjustments to CPAP necessary- I will see her in 51 month     Social History   Social History  . Marital status: Single    Spouse name: N/A  . Number of children: 0  . Years of education: masters   Occupational History  . 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 Smoking status: Never Smoker  . Smokeless tobacco: Never Used     Comment: only for 6 months in the 70s  . Alcohol use 0.0 oz/week     Comment: rare, once per month  . Drug use: No  . Sexual activity: No   Other Topics Concern  . 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 PSH showed an AHI of 120!!!! titrated to 8 cm watr cPAP , residual AHI of 1 and downlaod was done  01-23-11 .  excellent compliance - 7 hours and low leak,  nasal pillow mask.    BMI is considered morbidly obese.  discussed low carb  diet - weight watchers and restricted exercise. , aquatic .    Patient cannot exercise due to soreness in proximal muscles.and  would like to add coenzyme q 10 and carnitine . She needs a medical weight loss regimen - bariatric surgery?       FSS 48, Epworth 4 again ,  CMS compliant  residual AHI 1.1 and average use 6.48  hours , no naps    Patient is single and lives alone.   Patient does not have any children.   Patient is retired.   Patient has a Master's degree.   Patient is left-handed.   Patient drinks tea occasionally.               Family History  Problem Relation Age of Onset  . Alzheimer's disease Mother   . Hypertension Mother   . Alzheimer's disease Father   . Hypertension Father   . Colon cancer Father        45  . Hypertension Sister   . Breast cancer Sister   . Hypertension Brother     Past Medical History:  Diagnosis Date  . Anemia  oral iron occ.  . Basal cell carcinoma of nose   . Bronchitis   . Cancer of ovary ([place]) 2005   Stage 1A, BRCA 1/2 neg 6/14  . Diverticulitis    past history  . DVT (deep vein thrombosis) in pregnancy ([place])    LEFT LEG  . Femoral DVT (deep venous thrombosis) ([place]) 2008   -"wears compression hose all times" left leg  . HTN (hypertension)   . Hyperlipidemia   . OSA (obstructive sleep apnea)    hypoventilation, on CPAP  . Shingles     Past Surgical History:  Procedure Laterality Date  . BASAL CELL CARCINOMA EXCISION     resection with plastic surgery reconstrustion  . bilateral breast mass  1970   benigned, left breast  . cataract surgery Bilateral 0630,1601, 2005   with bilateral lens replacements  . COLONOSCOPY WITH PROPOFOL N/A 05/21/2014   Procedure: COLONOSCOPY WITH PROPOFOL;  Surgeon: Juanita Craver, MD;  Location: [facility];  Service: Endoscopy;  Laterality: N/A;  . HERNIA REPAIR  0932   umbilical repair with mesh   . TOTAL ABDOMINAL HYSTERECTOMY W/ BILATERAL SALPINGOOPHORECTOMY  2005   Stage IA ovarian cancer    Current Outpatient Prescriptions  Medication Sig Dispense Refill  . atorvastatin (LIPITOR) 40 MG tablet Take 1 tablet (40 mg total) by mouth daily. 90 tablet 3  . Calcium Carbonate-Vit D-Min (CALCIUM 1200 PO) Take by mouth.    . cetirizine (ZYRTEC) 5 MG tablet Take 5 mg by mouth daily.    . fish oil-omega-3 fatty acids 1000 MG capsule Take 1-2 g by mouth 2 (two) times daily.     Marland Kitchen lisinopril-hydrochlorothiazide (PRINZIDE,ZESTORETIC) 20-25 MG tablet Take 1 tablet by mouth daily. 90 tablet 3  . Multiple Vitamin (MULTIVITAMIN) tablet  Take 1 tablet by mouth every morning.     . niacin 500 MG tablet Take 500 mg by mouth every evening.     [name] 20 MG TABS tablet TAKE 1 TABLET (20 MG TOTAL) BY MOUTH DAILY. 90 tablet 3   No current facility-administered medications for this visit.     Allergies as of 07/27/2016 - Review Complete 07/27/2016  Allergen Reaction Noted  . Valtrex [valacyclovir hcl] Swelling and Rash 10/20/2011  . Benoxinate base  12/04/2010  . Sulfonamide derivatives  07/18/2008  . Fluorescein-benoxinate Other (See Comments) 04/22/2015  . Ivp dye [iodinated diagnostic agents] Rash 12/04/2010    Vitals: BP (!) 141/78   Pulse 84   Ht '5\' 7"'  (1.702 m)   Wt (!) 313 lb (142 kg)   LMP 01/12/2003   BMI 49.02 kg/m  Last Weight:  Wt Readings from Last 1 Encounters:  07/27/16 (!) 313 lb (142 kg)   Last Height:   Ht Readings from Last 1 Encounters:  07/27/16 '5\' 7"'  (1.702 m)     Physical exam:  General: The patient is awake, alert and appears not in acute distress. The patient is well groomed. Head: Normocephalic, atraumatic. Neck is supple. Mallampati 4 ,  neck circumference: 18 inches  - no retrognathia.  No TMJ .Short, thick neck  Cardiovascular:  Regular rate and rhythm, without  murmurs or carotid bruit, and without distended neck veins.  Lungs are clear to auscultation. Patient has restricted deep breathing.  Skin: ankle  Edema, this patient has unusual finger nail deformities- spooned , dimpled.  Trunk: BMI is morbidly obese , has normal posture.  Neurologic exam : The patient is awake and alert, oriented to place and time.  Memory  subjective  described as intact. There is a normal attention span & concentration ability. Speech is fluent without dysarthria, dysphonia or aphasia. Mood and affect are appropriate.  Cranial nerves: Pupils are equal and briskly reactive to light. Funduscopic exam without  evidence of pallor or edema. Extraocular movements  in vertical and horizontal planes intact  and without nystagmus. Visual fields by finger perimetry are intact. Hearing to finger rub intact.  Facial sensation intact to fine touch. Facial motor strength is symmetric and tongue and uvula move midline. Motor exam:   Gait and station: Patient walks with a cane as  assistive device . Gait is wide based.  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 Severe AHI of 120 in the patient baseline study completely corrected of only 8 cm of water pressure and no additional oxygen was prescribed the residual AHI was 0.8,  the average daily usage 7 hours and  4mnutes 2018-  Travel CPAP will be ordered after the patient [facility]   2 )Very high body mass index. Is encouraged to hydrate well, and to keep her vitamin K intake in mind , as she continues to be on warfarin. She is now swimming more regularly .   3)She has hair loss -  Question about green tea causing the hair loss- I could not support or deny this idea.    CLarey Seat MD   Cc; Dr. EDelman Cheadle

## 2016-07-28 DIAGNOSIS — Z8543 Personal history of malignant neoplasm of ovary: Secondary | ICD-10-CM | POA: Insufficient documentation

## 2016-07-28 DIAGNOSIS — M5136 Other intervertebral disc degeneration, lumbar region: Secondary | ICD-10-CM | POA: Insufficient documentation

## 2016-07-28 DIAGNOSIS — M47812 Spondylosis without myelopathy or radiculopathy, cervical region: Secondary | ICD-10-CM | POA: Insufficient documentation

## 2016-07-28 DIAGNOSIS — Z86718 Personal history of other venous thrombosis and embolism: Secondary | ICD-10-CM | POA: Insufficient documentation

## 2016-07-30 ENCOUNTER — Ambulatory Visit (INDEPENDENT_AMBULATORY_CARE_PROVIDER_SITE_OTHER): Payer: Medicare Other | Admitting: Rheumatology

## 2016-07-30 ENCOUNTER — Encounter: Payer: Self-pay | Admitting: Rheumatology

## 2016-07-30 VITALS — BP 146/77 | HR 75 | Resp 18 | Ht 67.0 in | Wt 314.0 lb

## 2016-07-30 DIAGNOSIS — M19072 Primary osteoarthritis, left ankle and foot: Secondary | ICD-10-CM

## 2016-07-30 DIAGNOSIS — M7062 Trochanteric bursitis, left hip: Secondary | ICD-10-CM | POA: Diagnosis not present

## 2016-07-30 DIAGNOSIS — M19041 Primary osteoarthritis, right hand: Secondary | ICD-10-CM

## 2016-07-30 DIAGNOSIS — M19071 Primary osteoarthritis, right ankle and foot: Secondary | ICD-10-CM

## 2016-07-30 DIAGNOSIS — R5383 Other fatigue: Secondary | ICD-10-CM

## 2016-07-30 DIAGNOSIS — M5136 Other intervertebral disc degeneration, lumbar region: Secondary | ICD-10-CM | POA: Diagnosis not present

## 2016-07-30 DIAGNOSIS — Z86718 Personal history of other venous thrombosis and embolism: Secondary | ICD-10-CM

## 2016-07-30 DIAGNOSIS — M47812 Spondylosis without myelopathy or radiculopathy, cervical region: Secondary | ICD-10-CM

## 2016-07-30 DIAGNOSIS — M5030 Other cervical disc degeneration, unspecified cervical region: Secondary | ICD-10-CM | POA: Diagnosis not present

## 2016-07-30 DIAGNOSIS — E782 Mixed hyperlipidemia: Secondary | ICD-10-CM

## 2016-07-30 DIAGNOSIS — M51369 Other intervertebral disc degeneration, lumbar region without mention of lumbar back pain or lower extremity pain: Secondary | ICD-10-CM

## 2016-07-30 DIAGNOSIS — M170 Bilateral primary osteoarthritis of knee: Secondary | ICD-10-CM

## 2016-07-30 DIAGNOSIS — M19042 Primary osteoarthritis, left hand: Secondary | ICD-10-CM

## 2016-07-30 DIAGNOSIS — G4733 Obstructive sleep apnea (adult) (pediatric): Secondary | ICD-10-CM

## 2016-07-30 DIAGNOSIS — Z8543 Personal history of malignant neoplasm of ovary: Secondary | ICD-10-CM

## 2016-07-30 DIAGNOSIS — M160 Bilateral primary osteoarthritis of hip: Secondary | ICD-10-CM

## 2016-07-30 DIAGNOSIS — M25561 Pain in right knee: Secondary | ICD-10-CM

## 2016-07-30 DIAGNOSIS — I10 Essential (primary) hypertension: Secondary | ICD-10-CM

## 2016-07-30 DIAGNOSIS — S8991XA Unspecified injury of right lower leg, initial encounter: Secondary | ICD-10-CM

## 2016-07-30 DIAGNOSIS — Z9989 Dependence on other enabling machines and devices: Secondary | ICD-10-CM

## 2016-07-30 DIAGNOSIS — E559 Vitamin D deficiency, unspecified: Secondary | ICD-10-CM

## 2016-07-30 LAB — IRON AND TIBC
%SAT: 18 % (ref 11–50)
IRON: 58 ug/dL (ref 45–160)
TIBC: 323 ug/dL (ref 250–450)
UIBC: 265 ug/dL

## 2016-07-30 MED ORDER — TRIAMCINOLONE ACETONIDE 40 MG/ML IJ SUSP
40.0000 mg | INTRAMUSCULAR | Status: AC | PRN
  Administered 2016-07-30: 40 mg via INTRA_ARTICULAR

## 2016-07-30 MED ORDER — LIDOCAINE HCL 2 % IJ SOLN
2.0000 mL | INTRAMUSCULAR | Status: AC | PRN
  Administered 2016-07-30: 2 mL

## 2016-07-30 NOTE — Progress Notes (Signed)
Office Visit Note  Patient: [name]             Date of Birth: [birth year]           MRN: [id number]             PCP: Shawnee Knapp, MD Referring: Shawnee Knapp, MD Visit Date: 07/30/2016 Occupation: '@GUAROCC' @    Subjective:  Arthritis (Hair loss, wants Vit D and Iron checked, wants inj. in right knee and left hip)   History of Present Illness: [name] is a 66 y.o. female  She was last seen in our office on 04/29/2016. On that visit, patient was having significant left greater trochanteric bursa pain and we gave her cortisone injection. The cortisone injection helped her for about a month and then she started having some pain in May and it gradually worsened until today. She rates her pain today as 5 or 6 on a scale of 0-10.  She reports that she injured her right knee approximately 2-2-1/2 months ago and she had to use a crutch and may have exacerbated her left greater trochanteric bursa as a result of using the crutch and relying some on her left leg to minimize problems to her right knee.  Patient is also complaining of right knee pain. She is requesting a cortisone injection. Patient recalls that about 2 months ago she was carrying a load of laundry and she tripped on a rug and tried to catch herself and jammed her right knee. It's gradually improved some but not fully and she is having fair amount of pain 2 months after the injury and feels that a cortisone injection can help accelerate feeling better. The entire knee was swollen for some time and it took a month and a half for the swelling to go down. That's why she ended up relying on the crutches for significant amount of time. Patient rates her pain between 5-6 on a scale of 0-10 for the right knee as well.  Unfortunately, both the knee and the left hip have been responsible for waking up the patient at night due to pain.    Activities of Daily Living:  Patient reports morning stiffness for 30 minutes.   Patient  Reports nocturnal pain.  Difficulty dressing/grooming: Denies Difficulty climbing stairs: Reports Difficulty getting out of chair: Denies Difficulty using hands for taps, buttons, cutlery, and/or writing: Denies   Review of Systems  Constitutional: Negative for fatigue.  HENT: Negative for mouth sores and mouth dryness.   Eyes: Negative for dryness.  Respiratory: Negative for shortness of breath.   Gastrointestinal: Negative for constipation and diarrhea.  Musculoskeletal: Negative for myalgias and myalgias.  Skin: Negative for sensitivity to sunlight.  Psychiatric/Behavioral: Negative for decreased concentration and sleep disturbance.    PMFS History:  Patient Active Problem List   Diagnosis Date Noted  . DJD (degenerative joint disease), cervical 07/28/2016  . DDD (degenerative disc disease), lumbar 07/28/2016  . History of DVT (deep vein thrombosis) 07/28/2016  . History of ovarian cancer 07/28/2016  . Primary osteoarthritis of both knees 04/20/2016  . Primary osteoarthritis of both hands 04/20/2016  . Primary osteoarthritis of both feet 04/20/2016  . Primary osteoarthritis of both hips 04/20/2016  . Vitamin D deficiency 04/20/2016  . Hyperlipidemia 10/15/2015  . Anemia 10/15/2015  . Sleep apnea with use of continuous positive airway pressure (CPAP) 07/22/2014  . OSA on CPAP 07/04/2013  . Obesity, morbid ([place]) 07/04/2013  . Morbid obesity ([place]) 11/24/2011  .  Arthritis of knee 11/24/2011  . Obese 12/04/2010  . Sleep apnea 12/04/2010  . ELECTROCARDIOGRAM, ABNORMAL 06/02/2009  . Acute thromboembolism of deep veins of lower extremity ([place]) 03/26/2008  . EDEMA 03/26/2008  . ADENOCARCINOMA, OVARY 03/25/2008  . ESSENTIAL HYPERTENSION, BENIGN 03/25/2008    Past Medical History:  Diagnosis Date  . Anemia    oral iron occ.  . Arthritis   . Basal cell carcinoma of nose   . Bronchitis   . 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 Diverticulitis    past  history  . DVT (deep vein thrombosis) in pregnancy ([place])    LEFT LEG  . Femoral DVT (deep venous thrombosis) ([place]) 2008   -"wears compression hose all times" left leg  . HTN (hypertension)   . Hyperlipidemia   . OSA (obstructive sleep apnea)    hypoventilation, on CPAP  . Shingles     Family History  Problem Relation Age of Onset  . Alzheimer's disease Mother   . Hypertension Mother   . Alzheimer's disease Father   . Hypertension Father   . Colon cancer Father        82  . Hypertension Sister   . Breast cancer Sister   . Hypertension Brother    Past Surgical History:  Procedure Laterality Date  . BASAL CELL CARCINOMA EXCISION     resection with plastic surgery reconstrustion  . bilateral breast mass  1970   benigned, left breast  . cataract surgery Bilateral [id number], 2005   with bilateral lens replacements  . COLONOSCOPY WITH PROPOFOL N/A 05/21/2014   Procedure: COLONOSCOPY WITH PROPOFOL;  Surgeon: Juanita Craver, MD;  Location: [facility];  Service: Endoscopy;  Laterality: N/A;  . HERNIA REPAIR  1856   umbilical repair with mesh   . 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 PSH showed an AHI of 120!!!! titrated to 8 cm watr cPAP , residual AHI of 1 and downlaod was done  01-23-11 .  excellent compliance - 7 hours and low leak,  nasal pillow mask.    BMI is considered morbidly obese.  discussed low carb  diet - weight watchers and restricted exercise. , aquatic .    Patient cannot exercise due to soreness in proximal muscles.and  would like to add coenzyme q 10 and carnitine . She needs a medical weight loss regimen - bariatric surgery?       FSS 48, [place] 4 again ,  CMS compliant  residual AHI 1.1 and average use 6.48  hours , no naps    Patient is single and lives alone.   Patient does not have any children.    Patient is retired.   Patient has a Master's degree.   Patient is left-handed.   Patient drinks tea occasionally.               Objective: Vital Signs: BP (!) 146/77 (BP Location: Left Arm, Patient Position: Sitting, Cuff Size: Normal)   Pulse 75   Resp 18   Ht '5\' 7"'  (1.702 m)   Wt (!) 314 lb (142.4 kg)   LMP 01/12/2003   BMI 49.18 kg/m    Physical Exam  Constitutional: She is oriented to person, place, and time. She appears well-developed and well-nourished.  HENT:  Head: Normocephalic and  atraumatic.  Eyes: Pupils are equal, round, and reactive to light. EOM are normal.  Cardiovascular: Normal rate, regular rhythm and normal heart sounds.  Exam reveals no gallop and no friction rub.   No murmur heard. Pulmonary/Chest: Effort normal and breath sounds normal. She has no wheezes. She has no rales.  Abdominal: Soft. Bowel sounds are normal. She exhibits no distension. There is no tenderness. There is no guarding. No hernia.  Musculoskeletal: Normal range of motion. She exhibits no edema, tenderness or deformity.  Lymphadenopathy:    She has no cervical adenopathy.  Neurological: She is alert and oriented to person, place, and time. Coordination normal.  Skin: Skin is warm and dry. Capillary refill takes less than 2 seconds. No rash noted.  Psychiatric: She has a normal mood and affect. Her behavior is normal.  Nursing note and vitals reviewed.    Musculoskeletal Exam:  Full range of motion of all joints Grip strength is equal and strong bilaterally Fibromyalgia tender points are absent  CDAI Exam: No CDAI exam completed.    Investigation: No additional findings.   Imaging: No results found.  Speciality Comments: No specialty comments available.    Procedures:  Large Joint Inj Date/Time: 07/30/2016 1:04 PM Performed by: Eliezer Lofts Authorized by: Eliezer Lofts   Consent Given by:  Patient Site marked: the procedure site was marked   Timeout: prior  to procedure the correct patient, procedure, and site was verified   Indications:  Pain Location:  Hip Site:  L greater trochanter Prep: patient was prepped and draped in usual sterile fashion   Needle Size:  27 G Approach:  Superior Ultrasound Guidance: No   Fluoroscopic Guidance: No   Arthrogram: No   Medications:  2 mL lidocaine 2 %; 40 mg triamcinolone acetonide 40 MG/ML Aspiration Attempted: Yes   Aspirate amount (mL):  0 Patient tolerance:  Patient tolerated the procedure well with no immediate complications  Left greater trochanter bursa injection. Patient tolerated the procedure well. Large Joint Inj Date/Time: 07/30/2016 1:05 PM Performed by: Eliezer Lofts Authorized by: Eliezer Lofts   Consent Given by:  Patient Site marked: the procedure site was marked   Timeout: prior to procedure the correct patient, procedure, and site was verified   Indications:  Pain and joint swelling Location:  Knee Site:  R knee Prep: patient was prepped and draped in usual sterile fashion   Needle Size:  27 G Needle Length:  1.5 inches Approach:  Medial Ultrasound Guidance: No   Fluoroscopic Guidance: No   Arthrogram: No   Medications:  2 mL lidocaine 2 %; 40 mg triamcinolone acetonide 40 MG/ML Aspiration Attempted: Yes   Patient tolerance:  Patient tolerated the procedure well with no immediate complications  Right knee injected with cortisone. Patient tolerated procedure well. There no complications   Allergies: Valtrex [valacyclovir hcl]; Benoxinate base; Sulfonamide derivatives; Fluorescein-benoxinate; and Ivp dye [iodinated diagnostic agents]   Assessment / Plan:     Visit Diagnoses: Primary osteoarthritis of both hands - Plan: Large Joint Injection/Arthrocentesis  Primary osteoarthritis of both hips  Primary osteoarthritis of both knees - Plan: Large Joint Injection/Arthrocentesis  Primary osteoarthritis of both feet  DJD (degenerative joint disease), cervical  DDD  (degenerative disc disease), lumbar  Morbid obesity ([place])  Essential hypertension, benign  Mixed hyperlipidemia  Obesity, morbid ([place])  OSA on CPAP  Vitamin D deficiency - Plan: VITAMIN D 25 Hydroxy (Vit-D Deficiency, Fractures)  History of DVT (deep vein thrombosis) - on Xarelto  History of  ovarian cancer  Other fatigue - Plan: Iron and TIBC  Acute pain of right knee  Right knee injury, initial encounter - Plan: Large Joint Injection/Arthrocentesis  Greater trochanteric bursitis, left - Plan: Large Joint Injection/Arthrocentesis      plan: #1: Right knee pain. Patient injured her right knee approximately 2 months ago. Had to use crutches for about 4-6 weeks. Is still having ongoing right knee pain. Patient requesting cortisone injection. Completed Euflex injections to bilateral knees on 04/02/2016. Left knee is doing well but the right knee is having pain and discomfort despite giving adequate time to recover.  Please see procedure note for full details. Right knee injected with 40 mg of Kenalog mixed with 23m's 2% lidocaine without epinephrine. Patient tolerated procedure well.  #2 bilateral OA of knees Patient should repeat her Euflex injections 6 months after the last one was completed. That would be 10/04/2016. I will apply for the medication so that patient is on schedule to get her injections starting September 2014. Patient requires these injections to be completed before 10/22/2016 since [place] She is requesting to get these injections done by that date if possible.  #3: Left greater trochanteric bursitis. Pain is described at 5-6 on a scale of 0-10. Please see procedure note for full details. Although I've given her the shot 04/29/2016, she is in significant pain and is requesting another injection. We discussed the importance of avoiding frequent injections but on this emergency situation, I will go ahead and  give the injection to the patient.  #4: Patient is doing well with her osteoarthritis. She was recently seen and evaluated for this. RTC as scheduled  Orders: Orders Placed This Encounter  Procedures  . Large Joint Injection/Arthrocentesis  . Large Joint Injection/Arthrocentesis  . VITAMIN D 25 Hydroxy (Vit-D Deficiency, Fractures)  . Iron and TIBC   No orders of the defined types were placed in this encounter.   Face-to-face time spent with patient was 30 minutes. GREATER THAN 50% of time was spent in counseling and coordination of care.  Follow-Up Instructions: Return as scheduled.   NEliezer Lofts PA-C  Note - This record has been created using [employer]  Chart creation errors have been sought, but may not always  have been located. Such creation errors do not reflect on  the standard of medical care.

## 2016-07-31 LAB — VITAMIN D 25 HYDROXY (VIT D DEFICIENCY, FRACTURES): Vit D, 25-Hydroxy: 32 ng/mL (ref 30–100)

## 2016-08-02 ENCOUNTER — Telehealth: Payer: Self-pay | Admitting: *Deleted

## 2016-08-02 NOTE — Telephone Encounter (Signed)
-----   Message from Eliezer Lofts, [name] sent at 08/02/2016 10:52 AM EDT ----- Tell patient Vitamin D is low normal at 32. Patient may benefit from taking the following: Okay to prescribe: ===>Vitamin D3, 50,000 units every week 12 weeks with no refill Iron and TIBC are normal.

## 2016-08-02 NOTE — Telephone Encounter (Signed)
Attempted to contact the patient and left message for patient to call the office.  

## 2016-08-03 MED ORDER — VITAMIN D (ERGOCALCIFEROL) 1.25 MG (50000 UNIT) PO CAPS: 50000.0000 [IU] | ORAL_CAPSULE | ORAL | 0 refills | Status: DC

## 2016-08-03 NOTE — Telephone Encounter (Signed)
Patient advised of lab results and prescription sent to pharmacy. Patient verbalized understanding.

## 2016-08-11 ENCOUNTER — Telehealth: Payer: Self-pay | Admitting: Obstetrics & Gynecology

## 2016-08-11 NOTE — Telephone Encounter (Signed)
Last BMD dated 07/26/12 at [facility]: repeat 3-5 years.  Spoke with patient. Advised as seen above. Patient states no physical changes since last BMD, no fractures. Patient states she will plan to repeat BMD next year. Advised patient Dr. Sabra Heck would review, will return call with any additional recommendations.   Routing to provider for final review. Patient is agreeable to disposition. Will close encounter.

## 2016-08-11 NOTE — Telephone Encounter (Signed)
Patient wanting to check with nurse to find out if she is supposed to have a bone density this year.

## 2016-08-26 LAB — HM MAMMOGRAPHY

## 2016-09-09 ENCOUNTER — Telehealth: Payer: Self-pay | Admitting: Rheumatology

## 2016-09-09 NOTE — Telephone Encounter (Signed)
In progress

## 2016-09-09 NOTE — Telephone Encounter (Signed)
Patient would like to begin process for Euflexxa injections.

## 2016-09-22 ENCOUNTER — Encounter: Payer: Self-pay | Admitting: Obstetrics & Gynecology

## 2016-09-28 ENCOUNTER — Telehealth: Payer: Self-pay | Admitting: Rheumatology

## 2016-09-28 NOTE — Telephone Encounter (Signed)
I called [facility] at [phone], STAT, patient purchased, BIL, I called patient.

## 2016-09-28 NOTE — Telephone Encounter (Signed)
Patient calling in reference to Euflexxa about medication order. Please call to discuss.

## 2016-09-29 ENCOUNTER — Telehealth: Payer: Self-pay | Admitting: Rheumatology

## 2016-09-29 NOTE — Telephone Encounter (Signed)
I LMOM for patient to call, and schedule Euflexxa appts.

## 2016-09-29 NOTE — Telephone Encounter (Signed)
Please call patient to schedule Euflexxa inj x 3 w/Dr. Estanislado Pandy, BIL, patient purchased. Euflexxa to be delivered to office 09/30/16. Thank you.

## 2016-09-29 NOTE — Telephone Encounter (Signed)
[facility] left a message, requesting a call back to confirm address, and dates for delivery of Euflexxa for patient.

## 2016-10-01 ENCOUNTER — Telehealth: Payer: Self-pay | Admitting: *Deleted

## 2016-10-01 NOTE — Telephone Encounter (Signed)
Euflexxa BIl patient purchased delivered to office 09/29/16. Dr. Corena Pilgrim.

## 2016-10-07 NOTE — Progress Notes (Signed)
   Procedure Note  Patient: [name]             Date of Birth: [birth year]           MRN: [id number]             Visit Date: 10/14/2016  Procedures: Visit Diagnoses: Primary osteoarthritis of both knees - Plan: Large Joint Injection/Arthrocentesis, Large Joint Injection/Arthrocentesis, Ambulatory referral to Physical Therapy Euflexxa #1 bilateral knees / patient purchase  Large Joint Inj Date/Time: 10/14/2016 12:10 PM Performed by: Bo Merino Authorized by: Bo Merino   Consent Given by:  Patient Site marked: the procedure site was marked   Timeout: prior to procedure the correct patient, procedure, and site was verified   Indications:  Pain and joint swelling Location:  Knee Site:  R knee Prep: patient was prepped and draped in usual sterile fashion   Needle Size:  27 G Needle Length:  1.5 inches Approach:  Medial Ultrasound Guidance: No   Fluoroscopic Guidance: No   Arthrogram: No   Medications:  20 mg Sodium Hyaluronate 20 MG/2ML; 1.5 mL lidocaine 1 % Aspiration Attempted: Yes   Patient tolerance:  Patient tolerated the procedure well with no immediate complications Large Joint Inj Date/Time: 10/14/2016 12:14 PM Performed by: Bo Merino Authorized by: Bo Merino   Consent Given by:  Patient Site marked: the procedure site was marked   Timeout: prior to procedure the correct patient, procedure, and site was verified   Indications:  Pain and joint swelling Location:  Knee Site:  L knee Prep: patient was prepped and draped in usual sterile fashion   Needle Size:  27 G Needle Length:  1.5 inches Approach:  Medial Ultrasound Guidance: No   Fluoroscopic Guidance: No   Arthrogram: No   Medications:  20 mg Sodium Hyaluronate 20 MG/2ML; 1.5 mL lidocaine 1 % Aspiration Attempted: Yes   Patient tolerance:  Patient tolerated the procedure well with no immediate complications    Bo Merino, MD

## 2016-10-14 ENCOUNTER — Ambulatory Visit (INDEPENDENT_AMBULATORY_CARE_PROVIDER_SITE_OTHER): Payer: Medicare Other | Admitting: Rheumatology

## 2016-10-14 DIAGNOSIS — M170 Bilateral primary osteoarthritis of knee: Secondary | ICD-10-CM

## 2016-10-14 DIAGNOSIS — M1711 Unilateral primary osteoarthritis, right knee: Secondary | ICD-10-CM | POA: Diagnosis not present

## 2016-10-14 DIAGNOSIS — M1712 Unilateral primary osteoarthritis, left knee: Secondary | ICD-10-CM | POA: Diagnosis not present

## 2016-10-14 MED ORDER — LIDOCAINE HCL 1 % IJ SOLN
1.5000 mL | INTRAMUSCULAR | Status: AC | PRN
  Administered 2016-10-14: 1.5 mL

## 2016-10-14 MED ORDER — SODIUM HYALURONATE (VISCOSUP) 20 MG/2ML IX SOSY
20.0000 mg | PREFILLED_SYRINGE | INTRA_ARTICULAR | Status: AC | PRN
  Administered 2016-10-14: 20 mg via INTRA_ARTICULAR

## 2016-10-14 NOTE — Progress Notes (Signed)
   Procedure Note  Patient: [name]             Date of Birth: 19-May-1950           MRN: [id number]             Visit Date: 10/21/2016  Procedures: Visit Diagnoses: Primary osteoarthritis of both knees - Plan: Large Joint Injection/Arthrocentesis, Large Joint Injection/Arthrocentesis  Vitamin D deficiency - Plan: VITAMIN D 25 Hydroxy (Vit-D Deficiency, Fractures) Euflexxa #2 bilateral knees patient purchase  Large Joint Inj Date/Time: 10/21/2016 11:56 AM Performed by: Bo Merino Authorized by: Bo Merino   Consent Given by:  Patient Site marked: the procedure site was marked   Timeout: prior to procedure the correct patient, procedure, and site was verified   Indications:  Pain and joint swelling Location:  Knee Site:  R knee Prep: patient was prepped and draped in usual sterile fashion   Needle Size:  27 G Needle Length:  1.5 inches Approach:  Medial Ultrasound Guidance: No   Fluoroscopic Guidance: No   Arthrogram: No   Medications:  20 mg Sodium Hyaluronate 20 MG/2ML; 1.5 mL lidocaine 1 % Aspiration Attempted: Yes   Aspirate amount (mL):  0 Patient tolerance:  Patient tolerated the procedure well with no immediate complications Large Joint Inj Date/Time: 10/21/2016 11:57 AM Performed by: Bo Merino Authorized by: Bo Merino   Consent Given by:  Patient Site marked: the procedure site was marked   Timeout: prior to procedure the correct patient, procedure, and site was verified   Indications:  Pain and joint swelling Location:  Knee Site:  L knee Prep: patient was prepped and draped in usual sterile fashion   Needle Size:  27 G Needle Length:  1.5 inches Approach:  Medial Ultrasound Guidance: No   Fluoroscopic Guidance: No   Arthrogram: No   Medications:  20 mg Sodium Hyaluronate 20 MG/2ML; 1.5 mL lidocaine 1 % Aspiration Attempted: Yes   Aspirate amount (mL):  0 Patient tolerance:  Patient tolerated the procedure well with  no immediate complications   Bo Merino, MD

## 2016-10-21 ENCOUNTER — Ambulatory Visit (INDEPENDENT_AMBULATORY_CARE_PROVIDER_SITE_OTHER): Payer: Medicare Other | Admitting: Rheumatology

## 2016-10-21 DIAGNOSIS — M1712 Unilateral primary osteoarthritis, left knee: Secondary | ICD-10-CM

## 2016-10-21 DIAGNOSIS — M170 Bilateral primary osteoarthritis of knee: Secondary | ICD-10-CM

## 2016-10-21 DIAGNOSIS — E559 Vitamin D deficiency, unspecified: Secondary | ICD-10-CM

## 2016-10-21 DIAGNOSIS — M1711 Unilateral primary osteoarthritis, right knee: Secondary | ICD-10-CM | POA: Diagnosis not present

## 2016-10-21 MED ORDER — SODIUM HYALURONATE (VISCOSUP) 20 MG/2ML IX SOSY
20.0000 mg | PREFILLED_SYRINGE | INTRA_ARTICULAR | Status: AC | PRN
  Administered 2016-10-21: 20 mg via INTRA_ARTICULAR

## 2016-10-21 MED ORDER — LIDOCAINE HCL 1 % IJ SOLN
1.5000 mL | INTRAMUSCULAR | Status: AC | PRN
  Administered 2016-10-21: 1.5 mL

## 2016-10-22 LAB — VITAMIN D 25 HYDROXY (VIT D DEFICIENCY, FRACTURES): VIT D 25 HYDROXY: 33 ng/mL (ref 30–100)

## 2016-10-22 NOTE — Progress Notes (Signed)
Vitamin D is low is advised to take vitamin D 2000 units every day

## 2016-10-24 ENCOUNTER — Other Ambulatory Visit: Payer: Self-pay | Admitting: Rheumatology

## 2016-10-26 ENCOUNTER — Ambulatory Visit (INDEPENDENT_AMBULATORY_CARE_PROVIDER_SITE_OTHER): Payer: Medicare Other | Admitting: Obstetrics & Gynecology

## 2016-10-26 ENCOUNTER — Encounter: Payer: Self-pay | Admitting: Obstetrics & Gynecology

## 2016-10-26 VITALS — BP 136/76 | HR 82 | Resp 16 | Ht 66.0 in | Wt 307.0 lb

## 2016-10-26 DIAGNOSIS — Z803 Family history of malignant neoplasm of breast: Secondary | ICD-10-CM | POA: Diagnosis not present

## 2016-10-26 DIAGNOSIS — Z01419 Encounter for gynecological examination (general) (routine) without abnormal findings: Secondary | ICD-10-CM | POA: Diagnosis not present

## 2016-10-26 DIAGNOSIS — C569 Malignant neoplasm of unspecified ovary: Secondary | ICD-10-CM | POA: Diagnosis not present

## 2016-10-26 NOTE — Progress Notes (Signed)
Scheduled patient while in office for genetics counseling and testing with [place] at [facility] on 11/04/2016 at 9:30 am. Patient is agreeable to date and time.

## 2016-10-26 NOTE — Progress Notes (Signed)
66 y.o. G0P0 SingleCaucasianF here for annual exam.  Doing well except having issues with osteoarthritis in her right knee.    Denies vaginal bleeding.      Ready to proceed with genetic testing after discussing with several family members.  Discussed this last year as pt has done BRCA 1/2 testing but nothing else.   Patient's last menstrual period was 01/12/2003.          Sexually active: No.  The current method of family planning is status post hysterectomy.  Exercising: Yes.    [facility] 3X week  Smoker:  no  Health Maintenance: Pap:  2005  History of abnormal Pap:  yes MMG:  08/26/16 BIRADS 2 benign  Colonoscopy:  05/21/14 polyps, Dr. Collene Mares- repeat 5 years  BMD:   07/26/12 normal, repeat 3-5 years  TDaP: 07/13/14  Pneumonia vaccine(s):  09/18/13, 10/10/14  Zostavax:   Done  Hep C testing: 10/10/14 negative  Screening Labs: PCP, discuss with provider, Hb today: PCP   reports that she has never smoked. She has never used smokeless tobacco. She reports that she drinks alcohol. She reports that she does not use drugs.  Past Medical History:  Diagnosis Date  . Anemia    oral iron occ.  . Arthritis   . Basal cell carcinoma of nose   . Bronchitis   . 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 Diverticulitis    past history  . DVT (deep vein thrombosis) in pregnancy ([place])    LEFT LEG  . Femoral DVT (deep venous thrombosis) ([place]) 2008   -"wears compression hose all times" left leg  . HTN (hypertension)   . Hyperlipidemia   . OSA (obstructive sleep apnea)    hypoventilation, on CPAP  . Shingles     Past Surgical History:  Procedure Laterality Date  . BASAL CELL CARCINOMA EXCISION     resection with plastic surgery reconstrustion  . bilateral breast mass  1970   benigned, left breast  . cataract surgery Bilateral 9030,0923, 2005   with bilateral lens replacements  . COLONOSCOPY WITH PROPOFOL N/A 05/21/2014   Procedure: COLONOSCOPY WITH PROPOFOL;  Surgeon: Juanita Craver, MD;  Location: [facility];  Service: Endoscopy;  Laterality: N/A;  . HERNIA REPAIR  3007   umbilical repair with mesh   . TOTAL ABDOMINAL HYSTERECTOMY W/ BILATERAL SALPINGOOPHORECTOMY  2005   Stage IA ovarian cancer    Current Outpatient Prescriptions  Medication Sig Dispense Refill  . ACETAMINOPHEN PO Take 650 mg by mouth 4 (four) times daily.    Marland Kitchen atorvastatin (LIPITOR) 40 MG tablet Take 1 tablet (40 mg total) by mouth daily. 90 tablet 3  . Calcium Carbonate-Vit D-Min (CALCIUM 1200 PO) Take by mouth.    . cetirizine (ZYRTEC) 5 MG tablet Take 5 mg by mouth daily.    . fish oil-omega-3 fatty acids 1000 MG capsule Take 1-2 g by mouth 2 (two) times daily.     Marland Kitchen lisinopril-hydrochlorothiazide (PRINZIDE,ZESTORETIC) 20-25 MG tablet Take 1 tablet by mouth daily. 90 tablet 3  . Multiple Vitamin (MULTIVITAMIN) tablet Take 1 tablet by mouth every morning.     . niacin 500 MG tablet Take 500 mg by mouth every evening.     [name] 20 MG TABS tablet TAKE 1 TABLET (20 MG TOTAL) BY MOUTH DAILY. 90 tablet 3   No current facility-administered medications for this visit.     Family History  Problem Relation Age of  Onset  . Alzheimer's disease Mother   . Hypertension Mother   . Alzheimer's disease Father   . Hypertension Father   . Colon cancer Father        35  . Hypertension Sister   . Breast cancer Sister   . Hypertension Brother   . Testicular cancer Brother     ROS:  Pertinent items are noted in HPI.  Otherwise, a comprehensive ROS was negative.  Exam:   BP 136/76 (BP Location: Right Arm, Patient Position: Sitting, Cuff Size: Large)   Pulse 82   Resp 16   Ht _0  (1.676 m)   Wt (!) 307 lb (139.3 kg)   LMP 01/12/2003   BMI 49.55 kg/m   Weight change: -10#  Height: _1  (167.6 cm)  Ht Readings from Last 3 Encounters:  10/26/16 _2  (1.676 m)  07/30/16 _3  (1.702 m)  07/27/16 _4  (1.702 m)    General appearance: alert, cooperative and appears stated age Head:  Normocephalic, without obvious abnormality, atraumatic Neck: no adenopathy, supple, symmetrical, trachea midline and thyroid normal to inspection and palpation Lungs: clear to auscultation bilaterally Breasts:  Right breast with no masses, skin changes, LAD.  Left breast with persistent mass at 1-2 o'clock, stable from last year.  No left nipple discharge.  No left axillary LAD. Heart: regular rate and rhythm Abdomen: soft, non-tender; bowel sounds normal; no masses,  no organomegaly Extremities: extremities normal, atraumatic, no cyanosis or edema Skin: Skin color, texture, turgor normal. No rashes or lesions Lymph nodes: Cervical, supraclavicular, and axillary nodes normal. No abnormal inguinal nodes palpated Neurologic: Grossly normal  Pelvic: External genitalia:  no lesions              Urethra:  normal appearing urethra with no masses, tenderness or lesions              Bartholins and Skenes: normal                 Vagina: normal appearing vagina with normal color and discharge, no lesions              Cervix: absent              Pap taken: No. Bimanual Exam:  Uterus:  uterus absent              Adnexa: no mass, fullness, tenderness               Rectovaginal: Confirms               Anus:  normal sphincter tone, no lesions  Chaperone was present for exam.  A:  Well Woman with normal exam H/O ovarian cancer, 1A.  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  Tyrer Cusick risk assessment:  24.7%.  Done in office today.  Will send for genetic testing.  If this is negative.  Will plan MRI and MMG alteratively.  Referral to genetics placed. pap smear not indicated Ca-125 obtained today return annually or prn

## 2016-10-27 LAB — CA 125: Cancer Antigen (CA) 125: 7.1 U/mL (ref 0.0–38.1)

## 2016-10-28 ENCOUNTER — Ambulatory Visit (INDEPENDENT_AMBULATORY_CARE_PROVIDER_SITE_OTHER): Payer: Medicare Other | Admitting: Rheumatology

## 2016-10-28 DIAGNOSIS — M19041 Primary osteoarthritis, right hand: Secondary | ICD-10-CM

## 2016-10-28 DIAGNOSIS — M19042 Primary osteoarthritis, left hand: Secondary | ICD-10-CM

## 2016-10-28 DIAGNOSIS — M170 Bilateral primary osteoarthritis of knee: Secondary | ICD-10-CM | POA: Diagnosis not present

## 2016-10-28 MED ORDER — SODIUM HYALURONATE (VISCOSUP) 20 MG/2ML IX SOSY
20.0000 mg | PREFILLED_SYRINGE | INTRA_ARTICULAR | Status: AC | PRN
  Administered 2016-10-28: 20 mg via INTRA_ARTICULAR

## 2016-10-28 MED ORDER — LIDOCAINE HCL 1 % IJ SOLN
1.5000 mL | INTRAMUSCULAR | Status: AC | PRN
  Administered 2016-10-28: 1.5 mL

## 2016-10-28 NOTE — Progress Notes (Signed)
   Procedure Note  Patient: [name]             Date of Birth: June 24, 1950           MRN: [id number]             Visit Date: 10/28/2016  Procedures: Visit Diagnoses: Bilateral primary osteoarthritis of knee - Plan: Large Joint Injection/Arthrocentesis, Large Joint Injection/Arthrocentesis  Primary osteoarthritis of both hands - Right thumb mucin cyst   Euflexxa, BIL, patient purchased, # 3.  Large Joint Inj Date/Time: 10/28/2016 11:42 AM Performed by: Bo Merino Authorized by: Bo Merino   Consent Given by:  Patient Site marked: the procedure site was marked   Timeout: prior to procedure the correct patient, procedure, and site was verified   Indications:  Pain and joint swelling Location:  Knee Site:  R knee Prep: patient was prepped and draped in usual sterile fashion   Needle Size:  27 G Needle Length:  1.5 inches Approach:  Medial Ultrasound Guidance: No   Fluoroscopic Guidance: No   Arthrogram: No   Medications:  1.5 mL lidocaine 1 %; 20 mg Sodium Hyaluronate 20 MG/2ML Aspiration Attempted: Yes   Patient tolerance:  Patient tolerated the procedure well with no immediate complications Large Joint Inj Date/Time: 10/28/2016 11:43 AM Performed by: Bo Merino Authorized by: Bo Merino   Consent Given by:  Patient Site marked: the procedure site was marked   Timeout: prior to procedure the correct patient, procedure, and site was verified   Indications:  Pain and joint swelling Location:  Knee Site:  L knee Prep: patient was prepped and draped in usual sterile fashion   Needle Size:  27 G Needle Length:  1.5 inches Approach:  Medial Ultrasound Guidance: No   Fluoroscopic Guidance: No   Arthrogram: No   Medications:  20 mg Sodium Hyaluronate 20 MG/2ML; 1.5 mL lidocaine 1 % Aspiration Attempted: Yes   Patient tolerance:  Patient tolerated the procedure well with no immediate complications Incision & Drainage Date/Time:  10/28/2016 12:19 PM Performed by: Bo Merino Authorized by: Bo Merino  Type: cyst Body area: upper extremity Location details: right thumb  Sedation: Patient sedated: no Needle gauge: 20 Incision type: elliptical Incision depth: dermal Complexity: simple Drainage: serous Drainage amount: scant Wound treatment: wound left open Packing material: none Patient tolerance: Patient tolerated the procedure well with no immediate complications   Right thumb uses cyst drained. Clear gelatinous substance was aspirated . Patient tolerated the procedure well. Bo Merino, MD

## 2016-11-04 ENCOUNTER — Ambulatory Visit (HOSPITAL_BASED_OUTPATIENT_CLINIC_OR_DEPARTMENT_OTHER): Payer: Medicare Other | Admitting: Genetic Counselor

## 2016-11-04 ENCOUNTER — Encounter: Payer: Self-pay | Admitting: Genetic Counselor

## 2016-11-04 ENCOUNTER — Other Ambulatory Visit: Payer: Medicare Other

## 2016-11-04 DIAGNOSIS — Z803 Family history of malignant neoplasm of breast: Secondary | ICD-10-CM | POA: Insufficient documentation

## 2016-11-04 DIAGNOSIS — Z7183 Encounter for nonprocreative genetic counseling: Secondary | ICD-10-CM | POA: Diagnosis not present

## 2016-11-04 DIAGNOSIS — Z853 Personal history of malignant neoplasm of breast: Secondary | ICD-10-CM

## 2016-11-04 DIAGNOSIS — Z1379 Encounter for other screening for genetic and chromosomal anomalies: Secondary | ICD-10-CM

## 2016-11-04 HISTORY — DX: Encounter for other screening for genetic and chromosomal anomalies: Z13.79

## 2016-11-04 NOTE — Progress Notes (Signed)
[facility]      Initial Visit   Patient Name: [place] Patient DOB: [birth year] Patient Age: 66 y.o. Encounter Date: 11/04/2016  Referring Provider: Hale Bogus, MD  Primary Care Provider: Shawnee Knapp, MD  Reason for Visit: Evaluate for hereditary susceptibility to cancer    Assessment and Plan:  . Ms. Leisner history is suggestive of a hereditary predisposition to cancer given the combination of her own ovarian cancer and family history of breast cancer.   . Testing is recommended to determine whether she has a pathogenic mutation that will impact her screening and risk-reduction for cancer. A negative result will be generally reassuring.  . Ms. Lutterman wished to pursue genetic testing and a blood sample will be sent for analysis of the 83 genes on [facility]'s Multi-Cancer panel (ALK, APC, ATM, AXIN2, BAP1, BARD1, BLM, BMPR1A, BRCA1, BRCA2, BRIP1, CASR, CDC73, CDH1, CDK4, CDKN1B, CDKN1C, CDKN2A, CEBPA, CHEK2, CTNNA1, DICER1, DIS3L2, EGFR, EPCAM, FH, FLCN, GATA2, GPC3, GREM1, HOXB13, HRAS, KIT, MAX, MEN1, MET, MITF, MLH1, MSH2, MSH3, MSH6, MUTYH, NBN, NF1, NF2, NTHL1, PALB2, PDGFRA, PHOX2B, PMS2, POLD1, POLE, POT1, PRKAR1A, PTCH1, PTEN, RAD50, RAD51C, RAD51D, RB1, RECQL4, RET, RUNX1, SDHA, SDHAF2, SDHB, SDHC, SDHD, SMAD4, SMARCA4, SMARCB1, SMARCE1, STK11, SUFU, TERC, TERT, TMEM127, TP53, TSC1, TSC2, VHL, WRN, WT1).   . Results should be available in approximately 2-4 weeks, at which point we will contact her and address implications for her as well as address genetic testing for at-risk family members, if needed.     Dr. Jana Hakim was available for questions concerning this case. Total time spent by me in face-to-face counseling was approximately 30 minutes.   _____________________________________________________________________   History of Present Illness: Ms. MAYDA SHIPPEE, a 66 y.o. female, is being seen at the [facility] due to a personal and family history of cancer. She presents to clinic today to discuss the possibility of a hereditary predisposition to cancer and discuss whether genetic testing is warranted.  Ms. Dutkiewicz has a history of ovarian cancer at the age of 27.  She had BRCA1/BRCA2 testing in 2005 as well as large rearrangement testing in these genes in 2014. Testing was normal. She has not had an expanded panel.  Past Medical History:  Diagnosis Date  . Anemia    oral iron occ.  . Arthritis   . Basal cell carcinoma of nose   . Bronchitis   . 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 Diverticulitis    past history  . DVT (deep vein thrombosis) in pregnancy ([place])    LEFT LEG  . Family history of breast cancer   . Femoral DVT (deep venous thrombosis) ([place]) 2008   -"wears compression hose all times" left leg  . History of breast cancer   . HTN (hypertension)   . Hyperlipidemia   . OSA (obstructive sleep apnea)    hypoventilation, on CPAP  . Shingles     Past Surgical History:  Procedure Laterality Date  . BASAL CELL CARCINOMA EXCISION     resection with plastic surgery reconstrustion  . bilateral breast mass  1970   benigned, left breast  . cataract surgery Bilateral 2505,3976, 2005   with bilateral lens replacements  . COLONOSCOPY WITH PROPOFOL N/A 05/21/2014   Procedure: COLONOSCOPY WITH PROPOFOL;  Surgeon: Juanita Craver, MD;  Location: [facility];  Service: Endoscopy;  Laterality: N/A;  . HERNIA REPAIR  8366   umbilical repair with mesh   . TOTAL ABDOMINAL HYSTERECTOMY W/ BILATERAL SALPINGOOPHORECTOMY  2005   Stage IA ovarian cancer    Social History   Social History  . Marital status: Single    Spouse name: N/A  . Number of children: 0  . Years of education: masters   Occupational History  . 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 Smoking status: Never Smoker  . Smokeless tobacco: Never Used      Comment: only for 6 months in the 70s  . Alcohol use 0.0 oz/week     Comment: rare, 2 yearly  . Drug use: No  . Sexual activity: No   Other Topics Concern  . 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 PSH showed an AHI of 120!!!! titrated to 8 cm watr cPAP , residual AHI of 1 and downlaod was done  01-23-11 .  excellent compliance - 7 hours and low leak,  nasal pillow mask.    BMI is considered morbidly obese.  discussed low carb  diet - weight watchers and restricted exercise. , aquatic .    Patient cannot exercise due to soreness in proximal muscles.and  would like to add coenzyme q 10 and carnitine . She needs a medical weight loss regimen - bariatric surgery?       FSS 48, Epworth 4 again ,  CMS compliant  residual AHI 1.1 and average use 6.48  hours , no naps    Patient is single and lives alone.   Patient does not have any children.   Patient is retired.   Patient has a Master's degree.   Patient is left-handed.   Patient drinks tea occasionally.               Family History:  During the visit, a 4-generation pedigree was obtained. Family tree will be scanned in the Media tab in Epic  Significant diagnoses include the following:  Family History  Problem Relation Age of Onset  . Alzheimer's disease Mother   . Hypertension Mother   . Alzheimer's disease Father   . Hypertension Father   . Colon cancer Father 52       deceased 31  . Hypertension Sister   . Breast cancer Sister 78  . Hypertension Brother   . Prostate cancer Brother 47  . Prostate cancer Other        father's maternal half-brother  . Breast cancer Other 85       daughter of brother; currently 69    Additionally, Ms. Hepner has no children. She has another sister (age 80). Her mother died at 24; cancer-free. She had 3 sisters and 3 brothers. Her father died at 56 of colon cancer; He had no full siblings.  Ms. Peaden ancestry is [name] -  NOS. There is no known Jewish ancestry and no consanguinity.  Discussion: We reviewed the characteristics, features and inheritance patterns of hereditary cancer syndromes. We discussed her risk of harboring a mutation in the context of her personal and family history. We discussed that her small paternal family and paucity of women makes risk assessment challenging. We discussed the process of genetic testing, insurance coverage and implications of results: positive, negative and variant of unknown significance (VUS).    Ms. Gaul questions were answered to her satisfaction today and she is welcome to  call with any additional questions or concerns. Thank you for the referral and allowing [place] to share in the care of your patient.    Steele Berg, MS, [place] Certified Genetic Counselor phone: [phone] [email or web address]   ______________________________________________________________________ For Office Staff:  Number of people involved in session: 1 Was an Intern/ student involved with case: no

## 2016-11-05 DIAGNOSIS — Z8481 Family history of carrier of genetic disease: Secondary | ICD-10-CM

## 2016-11-05 HISTORY — DX: Family history of carrier of genetic disease: Z84.81

## 2016-11-08 NOTE — Progress Notes (Signed)
Patient ID: [name], female   DOB: [birth year], 66 y.o.   MRN: [id number] [name] is seen today for F/U of post-phlebitic syndrome, anticoagulation  and abnormal ECG. She is an ovarian cancer survivor out 6 years now. Her initial extensive DVT was in the setting of ovarian CA. She has had multiple F/U duplex showing persistant clot and recanalizatoin. With her weight we have elected to keep her on anticoagulation  With her meds and compressoin hose her edema is stable. She has had an abnromal ECG in the past with poor R wave progression but normal EF and I suspect it is from lead placement and body habitus   Long discussion about bariatric surgery. She has had and open hysterectomy and mesh repair of hernia but would benefit from such surgery. She has now been Dx with servere sleep apnea and on CPAP Not interested in surgery Had injection of right knee with [name] and improved but will eventually need bilateral knee replacements   Changed to xarelto 2016   Needs Hb/Hct PLT and BMET checked twice/ year by [name]  Trying to do some water aerobics    ROS: Denies fever, malais, weight loss, blurry vision, decreased visual acuity, cough, sputum, SOB, hemoptysis, pleuritic pain, palpitaitons, heartburn, abdominal pain, melena, lower extremity edema, claudication, or rash.  All other systems reviewed and negative  General: BP (!) 146/92   [name] 76   Ht 5\' 7"  (1.702 m)   Wt (!) 310 lb 12.8 oz (141 kg)   LMP 01/12/2003   SpO2 97%   BMI 48.68 kg/m  Affect appropriate Obese female  HEENT: normal Neck supple with no adenopathy JVP normal no bruits no thyromegaly Lungs clear with no wheezing and good diaphragmatic motion Heart:  S1/S2 no murmur, no rub, gallop or click PMI normal Abdomen: benighn, BS positve, no tenderness, no AAA no bruit.  No HSM or HJR Distal pulses intact with no bruits Plus 2  bilateral  edema Neuro non-focal Skin warm and dry No muscular weakness    Current  Outpatient Prescriptions  Medication Sig Dispense Refill  . ACETAMINOPHEN PO Take 650 mg by mouth 4 (four) times daily.    Marland Kitchen atorvastatin (LIPITOR) 40 MG tablet Take 1 tablet (40 mg total) by mouth daily. 90 tablet 3  . cetirizine (ZYRTEC) 5 MG tablet Take 5 mg by mouth daily.    . fish oil-omega-3 fatty acids 1000 MG capsule Take 1-2 g by mouth 2 (two) times daily.     Marland Kitchen lisinopril-hydrochlorothiazide (PRINZIDE,ZESTORETIC) 20-25 MG tablet Take 1 tablet by mouth daily. 90 tablet 3  . Multiple Vitamin (MULTIVITAMIN) tablet Take 1 tablet by mouth every morning.     . niacin 500 MG tablet Take 500 mg by mouth every evening.     [name] 20 MG TABS tablet TAKE 1 TABLET (20 MG TOTAL) BY MOUTH DAILY. 90 tablet 3   No current facility-administered medications for this visit.     Allergies  Valtrex [valacyclovir hcl]; Benoxinate base; Sulfonamide derivatives; Fluorescein-benoxinate; and Ivp dye [iodinated diagnostic agents]  Electrocardiogram:  SR rate 82  Nonspecific ST changes   10/08/14  SR rate 85  Low voltage  10/13/15  SR rate 91 low voltage nonspecific ST changes 11/09/16 SR rate 78 low voltage no acute changes    Assessment and Plan  DVT/Postphebitic:  Continue xarelto  Can do better with edema without combination pill Stop prinzide and increase lisinopril 40 mg change diuretic to lasix 20 mg f/u labs  with [name] In 2 weeks  Chol;  On statin   HTN:  Well controlled.  Continue current medications and low sodium Dash type diet.    Abnormal ECG nonspecific ST/T wave changes f/u ECG in a year  OSA:  Compliant with CPAP consider echo in a year to assess RV function and PA pressure  F/U with me in a year   [employer]

## 2016-11-09 ENCOUNTER — Encounter: Payer: Medicare Other | Admitting: Family Medicine

## 2016-11-09 ENCOUNTER — Encounter: Payer: Self-pay | Admitting: Cardiovascular Disease

## 2016-11-09 ENCOUNTER — Ambulatory Visit (INDEPENDENT_AMBULATORY_CARE_PROVIDER_SITE_OTHER): Payer: Medicare Other | Admitting: Cardiovascular Disease

## 2016-11-09 VITALS — BP 146/92 | HR 78 | Ht 67.0 in | Wt 310.8 lb

## 2016-11-09 DIAGNOSIS — I10 Essential (primary) hypertension: Secondary | ICD-10-CM | POA: Diagnosis not present

## 2016-11-09 MED ORDER — LISINOPRIL 40 MG PO TABS: 40.0000 mg | ORAL_TABLET | Freq: Every day | ORAL | 3 refills | Status: DC

## 2016-11-09 MED ORDER — FUROSEMIDE 20 MG PO TABS: 20.0000 mg | ORAL_TABLET | Freq: Every day | ORAL | 3 refills | Status: DC

## 2016-11-09 NOTE — Patient Instructions (Addendum)
Medication Instructions:  Your physician has recommended you make the following change in your medication:  1-START Lisinopril 40 mg by mouth daily 2-START Lasix 20 mg by mouth daily 3-STOP Lisinopril/ hydrochlorothiazide   Labwork: NONE  Testing/Procedures: NONE  Follow-Up: Your physician wants you to follow-up in: 12 months with Dr. Johnsie Cancel. You will receive a reminder letter in the mail two months in advance. If you don't receive a letter, please call our office to schedule the follow-up appointment.   If you need a refill on your cardiac medications before your next appointment, please call your pharmacy.

## 2016-11-12 ENCOUNTER — Other Ambulatory Visit: Payer: Self-pay | Admitting: Cardiovascular Disease

## 2016-11-15 ENCOUNTER — Encounter: Payer: Self-pay | Admitting: Genetic Counselor

## 2016-11-15 ENCOUNTER — Ambulatory Visit: Payer: Self-pay | Admitting: Genetic Counselor

## 2016-11-15 DIAGNOSIS — Z1379 Encounter for other screening for genetic and chromosomal anomalies: Secondary | ICD-10-CM

## 2016-11-15 NOTE — Progress Notes (Signed)
[facility]          Result Disclosure   Patient Name: [place] Patient DOB: [birth year] Patient Age: 66 y.o. Encounter Date: 11/15/2016  Referring Provider: Lurline Del, MD  Reason for Call: Discuss results of genetic testing   Ms. Gaber was seen in the [place] [facility] on 11/04/2016 due to a personal and family history of cancer and concern regarding a hereditary predisposition to cancer in the family. Please refer to the prior Genetics clinic note for more information regarding Ms. Schaffer medical and family histories and our assessment at the time.   Genetic Testing: At the time of Ms. Clift visit, she chose to pursue genetic testing of multiple genes associated with hereditary susceptibility to cancer. Testing included sequencing and deletion/duplication analysis. 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 See below for additional interpretation.  A copy of the genetic test report will be scanned into Epic under the media tab.  The genes on the panel were 83 genes on [facility]'s Multi-Cancer panel (ALK, APC, ATM, AXIN2, BAP1, BARD1, BLM, BMPR1A, BRCA1, BRCA2, BRIP1, CASR, CDC73, CDH1, CDK4, CDKN1B, CDKN1C, CDKN2A, CEBPA, CHEK2, CTNNA1, DICER1, DIS3L2, EGFR, EPCAM, FH, FLCN, GATA2, GPC3, GREM1, HOXB13, HRAS, KIT, MAX, MEN1, MET, MITF, MLH1, MSH2, MSH3, MSH6, MUTYH, NBN, NF1, NF2, NTHL1, PALB2, PDGFRA, PHOX2B, PMS2, POLD1, POLE, POT1, PRKAR1A, PTCH1, PTEN, RAD50, RAD51C, RAD51D, RB1, RECQL4, RET, RUNX1, SDHA, SDHAF2, SDHB, SDHC, SDHD, SMAD4, SMARCA4, SMARCB1, SMARCE1, STK11, SUFU, TERC, TERT, TMEM127, TP53, TSC1, TSC2, VHL, WRN, WT1).  Since the current test is not perfect, it is possible that there may be a gene mutation that current testing cannot detect, but that chance is small. It is possible that a different genetic factor, which has not yet  been discovered or is not on this panel, is responsible for the cancer diagnoses in the family. Again, the likelihood of this is low. No additional testing is recommended at this time for Ms. Denes.  Background and Interpretation: When a person has a single FH gene mutation, it typically causes a rare hereditary cancersyndrome calledHereditary Leiomyomatosis and Renal Cell Cancer (HLRCC). HLRCC manifests with non-cancerous skin tumors (cutaneous leiomyomata) in over 75% of individuals. These are usually on the trunk, arms and legs. Almost all women with HLRCC tend to have multiple, large uterine fibroids. There is an increased risk of kidney cancer (~15%) in those with HLRCC.  When a child inherits two FH gene mutations (one inherited from each parent), they have a serious genetic condition called Fumarase Deficiency which leads to neurologic impairment including seizures and cerebral atrophy. The genetic change found in Ms. Colasanti has been seen in children with Fumarase Deficiency.   However,theFH alteration in Ms. Millis has not been well documented in individuals with HLRCC Freddi Starr et al.Eur J Hum Genet.2006) and occurs frequently in the general population. When considering Ms. Campoy personal and family histories, she does not meet clinical criteria for HLRCC. This likely means that this has no impact on her own health.  Cancer Screening: The above alteration does not explain Ms. Newhard personal history of ovarian cancer and family history of cancer and does not alter the recommend cancer screening guidelines provided by her physicians.   Family Members: We discussed that Ms. Puzio relatives do not need to get tested for this alteration unless they are planning on having children. At that time, they are strongly encouraged to first meet with a genetic  counselor to understand whether testing would even be beneficial. The concern is that this information will be misinterpreted  by a non-genetics provider.  Any relative who had cancer at a young age or had a particularly rare cancer may also wish to pursue genetic testing. Genetic counselors can be located in other cities, by visiting the website of the [employer] (ArtistMovie.se) and Field seismologist for a Dietitian by zip code.  We encouraged Ms. Agostini to remain in contact with [place] on an annual basis so we can update her personal and family histories, and let her know of advances in cancer genetics that may benefit the family. Our contact number was provided. Ms. Greening questions were answered to her satisfaction today, and she knows she is welcome to call anytime with additional questions.    Steele Berg, MS, [place] [employer] [place] Certified Genetic Counselor phone: [phone]

## 2016-11-18 ENCOUNTER — Ambulatory Visit (INDEPENDENT_AMBULATORY_CARE_PROVIDER_SITE_OTHER): Payer: Medicare Other

## 2016-11-18 ENCOUNTER — Encounter: Payer: Self-pay | Admitting: Family Medicine

## 2016-11-18 ENCOUNTER — Other Ambulatory Visit: Payer: Self-pay

## 2016-11-18 ENCOUNTER — Ambulatory Visit (INDEPENDENT_AMBULATORY_CARE_PROVIDER_SITE_OTHER): Payer: Medicare Other | Admitting: Family Medicine

## 2016-11-18 VITALS — BP 122/82 | HR 93 | Temp 98.2°F | Ht 67.0 in | Wt 308.2 lb

## 2016-11-18 VITALS — HR 93 | Temp 98.2°F | Resp 16 | Ht 67.0 in | Wt 308.4 lb

## 2016-11-18 DIAGNOSIS — E782 Mixed hyperlipidemia: Secondary | ICD-10-CM | POA: Diagnosis not present

## 2016-11-18 DIAGNOSIS — Z1389 Encounter for screening for other disorder: Secondary | ICD-10-CM

## 2016-11-18 DIAGNOSIS — R29898 Other symptoms and signs involving the musculoskeletal system: Secondary | ICD-10-CM | POA: Diagnosis not present

## 2016-11-18 DIAGNOSIS — R5381 Other malaise: Secondary | ICD-10-CM | POA: Diagnosis not present

## 2016-11-18 DIAGNOSIS — Z862 Personal history of diseases of the blood and blood-forming organs and certain disorders involving the immune mechanism: Secondary | ICD-10-CM | POA: Diagnosis not present

## 2016-11-18 DIAGNOSIS — Z1211 Encounter for screening for malignant neoplasm of colon: Secondary | ICD-10-CM

## 2016-11-18 DIAGNOSIS — Z0000 Encounter for general adult medical examination without abnormal findings: Secondary | ICD-10-CM

## 2016-11-18 DIAGNOSIS — Z780 Asymptomatic menopausal state: Secondary | ICD-10-CM | POA: Diagnosis not present

## 2016-11-18 DIAGNOSIS — M6281 Muscle weakness (generalized): Secondary | ICD-10-CM | POA: Diagnosis not present

## 2016-11-18 DIAGNOSIS — Z9989 Dependence on other enabling machines and devices: Secondary | ICD-10-CM | POA: Diagnosis not present

## 2016-11-18 DIAGNOSIS — Z6841 Body Mass Index (BMI) 40.0 and over, adult: Secondary | ICD-10-CM | POA: Diagnosis not present

## 2016-11-18 DIAGNOSIS — Z1212 Encounter for screening for malignant neoplasm of rectum: Secondary | ICD-10-CM

## 2016-11-18 DIAGNOSIS — G4733 Obstructive sleep apnea (adult) (pediatric): Secondary | ICD-10-CM | POA: Diagnosis not present

## 2016-11-18 DIAGNOSIS — I10 Essential (primary) hypertension: Secondary | ICD-10-CM

## 2016-11-18 DIAGNOSIS — Z86718 Personal history of other venous thrombosis and embolism: Secondary | ICD-10-CM

## 2016-11-18 DIAGNOSIS — Z136 Encounter for screening for cardiovascular disorders: Secondary | ICD-10-CM | POA: Diagnosis not present

## 2016-11-18 DIAGNOSIS — Z1383 Encounter for screening for respiratory disorder NEC: Secondary | ICD-10-CM

## 2016-11-18 DIAGNOSIS — Z1231 Encounter for screening mammogram for malignant neoplasm of breast: Secondary | ICD-10-CM | POA: Diagnosis not present

## 2016-11-18 DIAGNOSIS — Z23 Encounter for immunization: Secondary | ICD-10-CM | POA: Diagnosis not present

## 2016-11-18 DIAGNOSIS — E785 Hyperlipidemia, unspecified: Secondary | ICD-10-CM

## 2016-11-18 LAB — POCT URINALYSIS DIP (MANUAL ENTRY)
Bilirubin, UA: NEGATIVE
Blood, UA: NEGATIVE
GLUCOSE UA: NEGATIVE mg/dL
Ketones, POC UA: NEGATIVE mg/dL
Nitrite, UA: NEGATIVE
Protein Ur, POC: NEGATIVE mg/dL
SPEC GRAV UA: 1.025 (ref 1.010–1.025)
UROBILINOGEN UA: 0.2 U/dL
pH, UA: 6 (ref 5.0–8.0)

## 2016-11-18 MED ORDER — FLUTICASONE PROPIONATE 50 MCG/ACT NA SUSP: 2.0000 | Freq: Every day | NASAL | 5 refills | Status: DC

## 2016-11-18 MED ORDER — ZOSTER VAC RECOMB ADJUVANTED 50 MCG/0.5ML IM SUSR: 0.5000 mL | Freq: Once | INTRAMUSCULAR | 1 refills | Status: AC

## 2016-11-18 NOTE — Patient Instructions (Addendum)
[name] , Thank you for taking time to come for your Medicare Wellness Visit. I appreciate your ongoing commitment to your health goals. Please review the following plan we discussed and let me know if I can assist you in the future.   Screening recommendations/referrals: Colonoscopy: up to date, next due 05/20/2024 Mammogram: up to date, next due 08/27/2018 Bone Density: up to date, next due 07/26/2017 Recommended yearly ophthalmology/optometry visit for glaucoma screening and checkup Recommended yearly dental visit for hygiene and checkup  Vaccinations: Influenza vaccine: administered today  Pneumococcal vaccine: up to date Tdap vaccine: up to date, next due 07/12/2024 Shingles vaccine:  Check with your pharmacy about receiving this vaccine.    Advanced directives: copy in chart  Conditions/risks identified: Increase your mobility in the near future.   Next appointment: 11/18/16 @ 10:40 am with Dr. Brigitte Pulse    Preventive Care 65 Years and Older, Female Preventive care refers to lifestyle choices and visits with your health care provider that can promote health and wellness. What does preventive care include?  A yearly physical exam. This is also called an annual well check.  Dental exams once or twice a year.  Routine eye exams. Ask your health care provider how often you should have your eyes checked.  Personal lifestyle choices, including:  Daily care of your teeth and gums.  Regular physical activity.  Eating a healthy diet.  Avoiding tobacco and drug use.  Limiting alcohol use.  Practicing safe sex.  Taking low-dose aspirin every day.  Taking vitamin and mineral supplements as recommended by your health care provider. What happens during an annual well check? The services and screenings done by your health care provider during your annual well check will depend on your age, overall health, lifestyle risk factors, and family history of disease. Counseling  Your  health care provider may ask you questions about your:  Alcohol use.  Tobacco use.  Drug use.  Emotional well-being.  Home and relationship well-being.  Sexual activity.  Eating habits.  History of falls.  Memory and ability to understand (cognition).  Work and work Statistician.  Reproductive health. Screening  You may have the following tests or measurements:  Height, weight, and BMI.  Blood pressure.  Lipid and cholesterol levels. These may be checked every 5 years, or more frequently if you are over 50 years old.  Skin check.  Lung cancer screening. You may have this screening every year starting at age 32 if you have a 30-pack-year history of smoking and currently smoke or have quit within the past 15 years.  Fecal occult blood test (FOBT) of the stool. You may have this test every year starting at age 18.  Flexible sigmoidoscopy or colonoscopy. You may have a sigmoidoscopy every 5 years or a colonoscopy every 10 years starting at age 15.  Hepatitis C blood test.  Hepatitis B blood test.  Sexually transmitted disease (STD) testing.  Diabetes screening. This is done by checking your blood sugar (glucose) after you have not eaten for a while (fasting). You may have this done every 1-3 years.  Bone density scan. This is done to screen for osteoporosis. You may have this done starting at age 16.  Mammogram. This may be done every 1-2 years. Talk to your health care provider about how often you should have regular mammograms. Talk with your health care provider about your test results, treatment options, and if necessary, the need for more tests. Vaccines  Your health care provider may  recommend certain vaccines, such as:  Influenza vaccine. This is recommended every year.  Tetanus, diphtheria, and acellular pertussis (Tdap, Td) vaccine. You may need a Td booster every 10 years.  Zoster vaccine. You may need this after age 13.  Pneumococcal 13-valent  conjugate (PCV13) vaccine. One dose is recommended after age 47.  Pneumococcal polysaccharide (PPSV23) vaccine. One dose is recommended after age 22. Talk to your health care provider about which screenings and vaccines you need and how often you need them. This information is not intended to replace advice given to you by your health care provider. Make sure you discuss any questions you have with your health care provider. Document Released: 01/24/2015 Document Revised: 09/17/2015 Document Reviewed: 10/29/2014 Elsevier Interactive Patient Education  2017 [place] Prevention in the Home Falls can cause injuries. They can happen to people of all ages. There are many things you can do to make your home safe and to help prevent falls. What can I do on the outside of my home?  Regularly fix the edges of walkways and driveways and fix any cracks.  Remove anything that might make you trip as you walk through a door, such as a raised step or threshold.  Trim any bushes or trees on the path to your home.  Use bright outdoor lighting.  Clear any walking paths of anything that might make someone trip, such as rocks or tools.  Regularly check to see if handrails are loose or broken. Make sure that both sides of any steps have handrails.  Any raised decks and porches should have guardrails on the edges.  Have any leaves, snow, or ice cleared regularly.  Use sand or salt on walking paths during winter.  Clean up any spills in your garage right away. This includes oil or grease spills. What can I do in the bathroom?  Use night lights.  Install grab bars by the toilet and in the tub and shower. Do not use towel bars as grab bars.  Use non-skid mats or decals in the tub or shower.  If you need to sit down in the shower, use a plastic, non-slip stool.  Keep the floor dry. Clean up any water that spills on the floor as soon as it happens.  Remove soap buildup in the tub or  shower regularly.  Attach bath mats securely with double-sided non-slip rug tape.  Do not have throw rugs and other things on the floor that can make you trip. What can I do in the bedroom?  Use night lights.  Make sure that you have a light by your bed that is easy to reach.  Do not use any sheets or blankets that are too big for your bed. They should not hang down onto the floor.  Have a firm chair that has side arms. You can use this for support while you get dressed.  Do not have throw rugs and other things on the floor that can make you trip. What can I do in the kitchen?  Clean up any spills right away.  Avoid walking on wet floors.  Keep items that you use a lot in easy-to-reach places.  If you need to reach something above you, use a strong step stool that has a grab bar.  Keep electrical cords out of the way.  Do not use floor polish or wax that makes floors slippery. If you must use wax, use non-skid floor wax.  Do not have throw rugs and  other things on the floor that can make you trip. What can I do with my stairs?  Do not leave any items on the stairs.  Make sure that there are handrails on both sides of the stairs and use them. Fix handrails that are broken or loose. Make sure that handrails are as long as the stairways.  Check any carpeting to make sure that it is firmly attached to the stairs. Fix any carpet that is loose or worn.  Avoid having throw rugs at the top or bottom of the stairs. If you do have throw rugs, attach them to the floor with carpet tape.  Make sure that you have a light switch at the top of the stairs and the bottom of the stairs. If you do not have them, ask someone to add them for you. What else can I do to help prevent falls?  Wear shoes that:  Do not have high heels.  Have rubber bottoms.  Are comfortable and fit you well.  Are closed at the toe. Do not wear sandals.  If you use a stepladder:  Make sure that it is fully  opened. Do not climb a closed stepladder.  Make sure that both sides of the stepladder are locked into place.  Ask someone to hold it for you, if possible.  Clearly mark and make sure that you can see:  Any grab bars or handrails.  First and last steps.  Where the edge of each step is.  Use tools that help you move around (mobility aids) if they are needed. These include:  Canes.  Walkers.  Scooters.  Crutches.  Turn on the lights when you go into a dark area. Replace any light bulbs as soon as they burn out.  Set up your furniture so you have a clear path. Avoid moving your furniture around.  If any of your floors are uneven, fix them.  If there are any pets around you, be aware of where they are.  Review your medicines with your doctor. Some medicines can make you feel dizzy. This can increase your chance of falling. Ask your doctor what other things that you can do to help prevent falls. This information is not intended to replace advice given to you by your health care provider. Make sure you discuss any questions you have with your health care provider. Document Released: 10/24/2008 Document Revised: 06/05/2015 Document Reviewed: 02/01/2014 Elsevier Interactive Patient Education  2017 [employer].

## 2016-11-18 NOTE — Progress Notes (Signed)
Subjective:    Patient ID: [name], female    DOB: 05/23/1950, 66 y.o.   MRN: [id number] Chief Complaint  Patient presents with  . Annual Exam    HPI Primary Preventative Screenings: Cervical Cancer: Dr. Edwinna Areola STI screening: Neg Hep C 2016 Breast Cancer: done 08/2016 Colorectal Cancer: 2016 w/ Dr. Collene Mares - 3 small polyps so repeat in 5 yrs Tobacco use/EtOH/substances: Bone Density: 07/2012 nml FRAX  T -0.7 any major 12.2% and hip 0.3% Cardiac: cardiologist Dr. Jenkins Rouge - sees annually Weight/Blood sugar/Diet/Exercise: BMI Readings from Last 3 Encounters:  11/18/16 48.28 kg/m  11/09/16 48.68 kg/m  10/26/16 49.55 kg/m   Lab Results  Component Value Date   HGBA1C 5.2 10/16/2015   OTC/Vit/Supp/Herbal:  Mvi, Calcium carb 600 + D once a day though dietary calcium has decreased since last year. Stopped iron supp on my rec. Had stopped vit D supp on my rec but level dropped so Dr. Estanislado Pandy restarted her on high dose once wkly x 3 months then last recent level low nml at 32 so instructed to take 2000u/d otc D. Dentist/Optho: Dr. Gwenevere Ghazi opto Immunizations: Had zostavax vaccine 6 months prior to shingles which she got 09/2011, then valtrex allergy Immunization History  Administered Date(s) Administered  . Influenza,inj,Quad PF,6+ Mos 09/18/2013, 10/10/2014, 10/16/2015  . Pneumococcal Conjugate-13 09/18/2013  . Pneumococcal Polysaccharide-23 10/10/2014  . Td 07/13/2014     Chronic Medical Conditions: 1. Obesity 2. HTN:  Dr. Johnsie Cancel recently changed her from lisinopril-hctz 20-25 to lisinopril 40 and lasix 20 due to occ lower ext edema. BP has been good and edema sig improved 3.  HLD: Elev trig at 269 last yr, LDL 90, non-hdl 144. On lipitor 40, niacin 543m qam, and fish oil 4.  OSA: On cpap and tolerating well.  Is followed by Dr. DRoxy Horsemanannually.  5.  h/o DVT in 2008 - on Xarelto followed by Dr. NJohnsie Cancelannually 6.   H/o IDA - had her stop iron supp  last yr - were borderline low at last check.   H/o vit D def - nml vit D last mo but at low end after 3 mos of once weekly pt so will restart otc 2000/d. Does get some calcium in her diet and then once a day calcium supp 600u w/ vitamin D.  Has had a mobility issues with many evaluations and treatment - is wearing orthotic shoes and shots in her knees - don't know what to do next - maybe a chiropractor - LBP and can't stand for a long time and myalgias. Would like to go to PT for swimming and land, her prior PT is NLexine Batonand does not have pool exercises. and has been referred to Dr. DEstanislado Pandy Going to [employer] market.  Everything hurts.  She gets euflexxa into both her knees every 6 months for several years.  Seeing her for 4 yrs for  Keeps a high wbc back to 2005 when she had cancer. Had short term chemo - they do CA=-125 annually but has not had [place] Had ovarian cancer in 1 ovary but had both out. Bursitis/arthritis:   Hips, knees, feet, low back. But upper body is good.  Seeing Dr. DEstanislado Pandywho referred her back to PT recently though it hasn't helped more prior- the woman who was in [facility]  States she can't walk and has had numerous referrals and noone can figure out why she can't walk. She has thought that perhaps going back to  PT might help with the muscle weakness and hip bursitis.  She connects this back to muscle loss when she was bedridden after the DVT.  She had several visits in [facility] PT and goes 3d/wk to aquatic center. Then she want to another one that just made her go to through her home exercises everytime, had trouble getting in and out of the pool, and bruised her.  As doesn't know whether the orthopedic shoes are helping. She thinks her back muscles are weak from the lower extremity weakness.    Post-nasal drip: takes cetirizine daily but still having a lot of sxs.  No nasal sprays. Keeps zyrtec on all the time. Having year round PND at certain times of the year it does  get worse with allergies/hay fever symptoms.    No GERD  Stomach pain: wonders if she had sensitive areas in her bowels that are intermittent and wants to cont watchful waiting.   Cough from lisinopril intermittently. Has tried other BP meds and was unable to tolerate them. Does not want to change her med.  Leg swelling around her ankles - Had a h/o DVT in 2008 in left leg. Typical lipedema pattern with feet sparing. Thinks that poss muscle atropathy might be exacerbating.   Past Medical History:  Diagnosis Date  . Anemia    oral iron occ.  . Arthritis   . Basal cell carcinoma of nose   . Bronchitis   . 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 Diverticulitis    past history  . DVT (deep vein thrombosis) in pregnancy ([place])    LEFT LEG  . Family history of breast cancer   . 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 FH: genetic disease carrier 11/05/2016  . Genetic testing 11/04/2016   Multi-Cancer panel (83 genes) @ [facility] - see report; special interpretation  . History of ovarian cancer   . HTN (hypertension)   . Hyperlipidemia   . OSA (obstructive sleep apnea)    hypoventilation, on CPAP  . Shingles    Past Surgical History:  Procedure Laterality Date  . BASAL CELL CARCINOMA EXCISION     resection with plastic surgery reconstrustion  . bilateral breast mass  1970   benigned, left breast  . cataract surgery Bilateral 1610,9604, 2005   with bilateral lens replacements  . HERNIA REPAIR  5409   umbilical repair with mesh   . TOTAL ABDOMINAL HYSTERECTOMY W/ BILATERAL SALPINGOOPHORECTOMY  2005   Stage IA ovarian cancer   Current Outpatient Medications on File Prior to Visit  Medication Sig Dispense Refill  . ACETAMINOPHEN PO Take 650 mg by mouth 4 (four) times daily.    Marland Kitchen atorvastatin (LIPITOR) 40 MG tablet Take 1 tablet (40 mg total) by mouth daily. 90 tablet 3  . cetirizine (ZYRTEC) 5 MG  tablet Take 5 mg by mouth daily.    . fish oil-omega-3 fatty acids 1000 MG capsule Take 1-2 g by mouth 2 (two) times daily.     . furosemide (LASIX) 20 MG tablet Take 1 tablet (20 mg total) by mouth daily. 90 tablet 3  . lisinopril (PRINIVIL,ZESTRIL) 40 MG tablet Take 1 tablet (40 mg total) by mouth daily. 90 tablet 3  . Multiple Vitamin (MULTIVITAMIN) tablet Take 1 tablet by mouth every morning.     . niacin 500 MG tablet Take 500 mg by mouth every evening.     Marland Kitchen  XARELTO 20 MG TABS tablet TAKE 1 TABLET (20 MG TOTAL) BY MOUTH DAILY. 90 tablet 1   No current facility-administered medications on file prior to visit.    Allergies  Allergen Reactions  . Valtrex [Valacyclovir Hcl] Swelling and Rash    Swelling of face and arm, rash  . Benoxinate Base     Turns eyes blood red/burning. --in eyedrops--  . Sulfonamide Derivatives   . Ivp Dye [Iodinated Diagnostic Agents] Rash    Rash on back   Family History  Problem Relation Age of Onset  . Alzheimer's disease Mother   . Hypertension Mother   . Alzheimer's disease Father   . Hypertension Father   . Colon cancer Father 56       deceased 49  . Hypertension Sister   . Breast cancer Sister 75  . Hypertension Brother   . Prostate cancer Brother 46  . Prostate cancer Other        father's maternal half-brother  . Breast cancer Other 25       daughter of brother; currently 25   Social History   Socioeconomic History  . Marital status: Single    Spouse name: None  . Number of children: 0  . Years of education: masters  . Highest education level: Master's degree (e.g., MA, MS, MEng, MEd, MSW, MBA)  Social Needs  . Financial resource strain: Not hard at all  . Food insecurity - worry: Never true  . Food insecurity - inability: Never true  . Transportation needs - medical: No  . Transportation needs - non-medical: No  Occupational History  . Occupation: Clinical research associate: [employer]    Comment: fifth grade  (retired)  Tobacco Use  . Smoking status: Never Smoker  . Smokeless tobacco: Never Used  . Tobacco comment: only for 6 months in the 70s  Substance and Sexual Activity  . Alcohol use: Yes    Alcohol/week: 0.0 oz    Comment: rare, 2 yearly  . Drug use: No  . Sexual activity: No    Partners: Female    Birth control/protection: Surgical  Other Topics Concern  . 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 PSH showed an AHI of 120!!!! titrated to 8 cm watr cPAP , residual AHI of 1 and downlaod was done  01-23-11 .  excellent compliance - 7 hours and low leak,  nasal pillow mask.    BMI is considered morbidly obese.  discussed low carb  diet - weight watchers and restricted exercise. , aquatic .    Patient cannot exercise due to soreness in proximal muscles.and  would like to add coenzyme q 10 and carnitine . She needs a medical weight loss regimen - bariatric surgery?       FSS 48, Ep[place] 4 again ,  CMS compliant  residual AHI 1.1 and average use 6.48  hours , no naps    Patient is single and lives alone.   Patient does not have any children.   Patient is retired.   Patient has a Master's degree.   Patient is left-handed.   Patient drinks tea occasionally.             Depression screen [facility] 2/9 11/18/2016 01/15/2016 12/29/2015 10/16/2015 10/10/2014  Decreased Interest 0 0 0 0 0  Down, Depressed, Hopeless 0 0 0 0 0  PHQ - 2 Score 0 0 0 0 0  Review of Systems See hpi    Objective:   Physical Exam  Constitutional: She is oriented to person, place, and time. She appears well-developed and well-nourished. No distress.  HENT:  Head: Normocephalic and atraumatic.  Right Ear: Tympanic membrane, external ear and ear canal normal.  Left Ear: Tympanic membrane, external ear and ear canal normal.  Nose: Nose normal. No mucosal edema or rhinorrhea.  Mouth/Throat: Uvula is midline, oropharynx is clear and moist and mucous membranes are  normal. No oropharyngeal exudate.  Eyes: Conjunctivae are normal. Right eye exhibits no discharge. Left eye exhibits no discharge. No scleral icterus.  Neck: Normal range of motion. Neck supple. No thyromegaly present.  Cardiovascular: Normal rate, regular rhythm, normal heart sounds and intact distal pulses.  Pulmonary/Chest: Effort normal and breath sounds normal. No respiratory distress.  Abdominal: Soft. Bowel sounds are normal. She exhibits no distension and no mass. There is no tenderness. There is no rebound and no guarding.  Musculoskeletal: She exhibits no edema.  Lymphadenopathy:    She has no cervical adenopathy.  Neurological: She is alert and oriented to person, place, and time.  Skin: Skin is warm and dry. She is not diaphoretic. No erythema.  Psychiatric: She has a normal mood and affect. Her behavior is normal.       Pulse 93   Temp 98.2 F (36.8 C)   Resp 16   Ht '5\' 7"'  (1.702 m)   Wt (!) 308 lb 6.4 oz (139.9 kg)   LMP 01/12/2003   SpO2 98%   BMI 48.30 kg/m   nml iron 3 mos prior nml CA-125    Assessment & Plan:  Obesity BMI 5` Cbc, ua, cmp, lipid Tsh, a1c  1. Annual physical exam - will get dexa with next mammogram at breast center, order entered.  2. Screening for cardiovascular, respiratory, and genitourinary diseases   3. Encounter for screening mammogram for breast cancer   4. Screening for colorectal cancer   5. Class 3 severe obesity due to excess calories without serious comorbidity with body mass index (BMI) of 50.0 to 59.9 in adult ([place])   6. Essential hypertension, benign   7. Mixed hyperlipidemia   8. OSA on CPAP   9. History of anemia   10. History of DVT (deep vein thrombosis)   11. Debility - dependent upon cane. Attributes to the muscle wasting she had when bedridden after DVT in 2008 (10 yrs ago).  12. Muscle weakness of lower extremity - already done pool PT twice and continues to do exercises in pool 3x/wk w/ minimal improvement. Had  sig improvement when she had another issue working with PT Lexine Baton at [facility] (on [address]) so will refer back to work with Lexine Baton again.  Pt considering DO or chiropractor but encouraged her to try PT first then re-eval.  13. Weakness of low back due to inactivity     Orders Placed This Encounter  Procedures  . CBC with Differential/Platelet  . Comprehensive metabolic panel    Order Specific Question:   Has the patient fasted?    Answer:   Yes  . Lipid panel    Order Specific Question:   Has the patient fasted?    Answer:   Yes  . TSH  . Ambulatory referral to Physical Therapy    Referral Priority:   Routine    Referral Type:   Physical Medicine    Referral Reason:   Specialty Services Required    Requested Specialty:  Physical Therapy    Number of Visits Requested:   1  . POCT urinalysis dipstick    Meds ordered this encounter  Medications  . Zoster Vaccine Adjuvanted [facility]) injection    Sig: Inject 0.5 mLs once for 1 dose into the muscle. Repeat once in 2 to 6 months    Dispense:  0.5 mL    Refill:  1  . fluticasone (FLONASE) 50 MCG/ACT nasal spray    Sig: Place 2 sprays at bedtime into both nostrils.    Dispense:  16 g    Refill:  5    Delman Cheadle, M.D.  Primary Care at [facility] [address] [place], [place] [address] [phone] phone [phone] fax  11/20/16 9:25 PM

## 2016-11-18 NOTE — Patient Instructions (Addendum)
Start Vitamin D 2000u day.  If you are doing less calcium in your diet, you may need to double up on your calcium supplement.  Try a nasal steroid like flonase, rhinocort, nasacort, or nasonex instead of the daily antihistamine due to risk of dementia with chronic long-term use.     IF you received an x-ray today, you will receive an invoice from [facility]. Please contact [facility] at [phone] with questions or concerns regarding your invoice.   IF you received labwork today, you will receive an invoice from [facility]. Please contact [facility] at [phone] with questions or concerns regarding your invoice.   Our billing staff will not be able to assist you with questions regarding bills from these companies.  You will be contacted with the lab results as soon as they are available. The fastest way to get your results is to activate your My Chart account. Instructions are located on the last page of this paperwork. If you have not heard from [place] regarding the results in 2 weeks, please contact this office.      Calcium Intake Recommendations Calcium is a mineral that affects many functions in the body, including:  Blood clotting.  Blood vessel function.  Nerve impulse conduction.  Hormone secretion.  Muscle contraction.  Bone and teeth functions.  Most of your body's calcium supply is stored in your bones and teeth. When your calcium stores are low, you may be at risk for low bone mass, bone loss, and bone fractures. Consuming enough calcium helps to grow healthy bones and teeth and to prevent breakdown over time. It is very important that you get enough calcium if you are:  A child undergoing rapid growth.  An adolescent girl.  A pre- or post-menopausal woman.  A woman whose menstrual cycle has stopped due to anorexia nervosa or regular intense exercise.  An individual with lactose intolerance or a milk allergy.  A vegetarian.  What is my  plan? Try to consume the recommended amount of calcium daily based on your age. Depending on your overall health, your health care provider may recommend increased calcium intake.General daily calcium intake recommendations by age are:  Birth to 6 months: 200 mg.  Infants 7 to 12 months: 260 mg.  Children 1 to 3 years: 700 mg.  Children 4 to 8 years: 1,000 mg.  Children 9 to 13 years: 1,300 mg.  Teens 14 to 18 years: 1,300 mg.  Adults 19 to 50 years: 1,000 mg.  Adult women 51 to 70 years: 1,200 mg.  Adult men 51 to 70 years: 1,000 mg.  Adults 71 years and older: 1,200 mg.  Pregnant and breastfeeding teens: 1,300 mg.  Pregnant and breastfeeding adults: 1,000 mg.  What do I need to know about calcium intake?  In order for the body to absorb calcium, it needs vitamin D. You can get vitamin D through: ? Direct exposure of the skin to sunlight. ? Foods, such as egg yolks, liver, saltwater fish, and fortified milk. ? Supplements.  Consuming too much calcium may cause: ? Constipation. ? Decreased absorption of iron and zinc. ? Kidney stones.  Calcium supplements may interact with certain medicines. Check with your health care provider before starting any calcium supplements.  Try to get most of your calcium from food. What foods can I eat? Grains  Fortified oatmeal. Fortified ready-to-eat cereals. Fortified frozen waffles. Vegetables Turnip greens. Broccoli. Fruits Fortified orange juice. Meats and Other Protein Sources Canned sardines with bones. Canned salmon with  bones. Soy beans. Tofu. Baked beans. Almonds. [place] nuts. Sunflower seeds. Dairy Milk. Yogurt. Cheese. Cottage cheese. Beverages Fortified soy milk. Fortified rice milk. Sweets/Desserts Pudding. Ice Cream. Milkshakes. Blackstrap molasses. The items listed above may not be a complete list of recommended foods or beverages. Contact your dietitian for more options. What foods can affect my calcium  intake? It may be more difficult for your body to use calcium or calcium may leave your body more quickly if you consume large amounts of:  Sodium.  Protein.  Caffeine.  Alcohol.  This information is not intended to replace advice given to you by your health care provider. Make sure you discuss any questions you have with your health care provider. Document Released: 08/12/2003 Document Revised: 07/18/2015 Document Reviewed: 06/05/2013 Elsevier Interactive Patient Education  2018 [employer].   Vitamin D Deficiency Vitamin D deficiency is when your body does not have enough vitamin D. Vitamin D is important to your body for many reasons:  It helps the body to absorb two important minerals, called calcium and phosphorus.  It plays a role in bone health.  It may help to prevent some diseases, such as diabetes and multiple sclerosis.  It plays a role in muscle function, including heart function.  You can get vitamin D by:  Eating foods that naturally contain vitamin D.  Eating or drinking milk or other dairy products that have vitamin D added to them.  Taking a vitamin D supplement or a multivitamin supplement that contains vitamin D.  Being in the sun. Your body naturally makes vitamin D when your skin is exposed to sunlight. Your body changes the sunlight into a form of the vitamin that the body can use.  If vitamin D deficiency is severe, it can cause a condition in which your bones become soft. In adults, this condition is called osteomalacia. In children, this condition is called rickets. What are the causes? Vitamin D deficiency may be caused by:  Not eating enough foods that contain vitamin D.  Not getting enough sun exposure.  Having certain digestive system diseases that make it difficult for your body to absorb vitamin D. These diseases include Crohn disease, chronic pancreatitis, and cystic fibrosis.  Having a surgery in which a part of the stomach or a part  of the small intestine is removed.  Being obese.  Having chronic kidney disease or liver disease.  What increases the risk? This condition is more likely to develop in:  Older people.  People who do not spend much time outdoors.  People who live in a long-term care facility.  People who have had broken bones.  People with weak or thin bones (osteoporosis).  People who have a disease or condition that changes how the body absorbs vitamin D.  People who have dark skin.  People who take certain medicines, such as steroid medicines or certain seizure medicines.  People who are overweight or obese.  What are the signs or symptoms? In mild cases of vitamin D deficiency, there may not be any symptoms. If the condition is severe, symptoms may include:  Bone pain.  Muscle pain.  Falling often.  Broken bones caused by a minor injury.  How is this diagnosed? This condition is usually diagnosed with a blood test. How is this treated? Treatment for this condition may depend on what caused the condition. Treatment options include:  Taking vitamin D supplements.  Taking a calcium supplement. Your health care provider will suggest what dose is best  for you.  Follow these instructions at home:  Take medicines and supplements only as told by your health care provider.  Eat foods that contain vitamin D. Choices include: ? Fortified dairy products, cereals, or juices. Fortified means that vitamin D has been added to the food. Check the label on the package to be sure. ? Fatty fish, such as salmon or trout. ? Eggs. ? Oysters.  Do not use a tanning bed.  Maintain a healthy weight. Lose weight, if needed.  Keep all follow-up visits as told by your health care provider. This is important. Contact a health care provider if:  Your symptoms do not go away.  You feel like throwing up (nausea) or you throw up (vomit).  You have fewer bowel movements than usual or it is  difficult for you to have a bowel movement (constipation). This information is not intended to replace advice given to you by your health care provider. Make sure you discuss any questions you have with your health care provider. Document Released: 03/22/2011 Document Revised: 06/11/2015 Document Reviewed: 05/15/2014 Elsevier Interactive Patient Education  2018 [employer].

## 2016-11-18 NOTE — Progress Notes (Signed)
Subjective:   [name] is a 66 y.o. female who presents for Medicare Annual (Subsequent) preventive examination.  Review of Systems:  N/A Cardiac Risk Factors include: advanced age (>64mn, >>58women);dyslipidemia;hypertension;obesity (BMI >30kg/m2)     Objective:     Vitals: BP 122/82   Pulse 93   Temp 98.2 F (36.8 C) (Oral)   Ht '5\' 7"'  (1.702 m)   Wt (!) 308 lb 4 oz (139.8 kg)   LMP 01/12/2003   SpO2 98%   BMI 48.28 kg/m   Body mass index is 48.28 kg/m.   Tobacco Social History   Tobacco Use  Smoking Status Never Smoker  Smokeless Tobacco Never Used  Tobacco Comment   only for 6 months in the 70s     Counseling given: Not Answered Comment: only for 6 months in the 70s   Past Medical History:  Diagnosis Date  . Anemia    oral iron occ.  . Arthritis   . Basal cell carcinoma of nose   . Bronchitis   . [place]   Stage 1A, BRCA 1/2 neg 6/14  . Diverticulitis    past history  . DVT (deep vein thrombosis) in pregnancy ([place]    LEFT LEG  . Family history of breast cancer   . [place]   -"wears compression hose all times" left leg  . FH: genetic disease carrier 11/05/2016  . Genetic testing 11/04/2016   Multi-Cancer panel (83 genes) @ [facility] - see report; special interpretation  . History of ovarian cancer   . HTN (hypertension)   . Hyperlipidemia   . OSA (obstructive sleep apnea)    hypoventilation, on CPAP  . Shingles    Past Surgical History:  Procedure Laterality Date  . BASAL CELL CARCINOMA EXCISION     resection with plastic surgery reconstrustion  . bilateral breast mass  1970   benigned, left breast  . cataract surgery Bilateral 18563,[id number] 2005   with bilateral lens replacements  . HERNIA REPAIR  20263  umbilical repair with mesh   . TOTAL ABDOMINAL HYSTERECTOMY W/ BILATERAL SALPINGOOPHORECTOMY  2005   Stage IA ovarian cancer   Family History  Problem Relation Age of  Onset  . Alzheimer's disease Mother   . Hypertension Mother   . Alzheimer's disease Father   . Hypertension Father   . Colon cancer Father 870      deceased 840 . Hypertension Sister   . Breast cancer Sister 722 . Hypertension Brother   . Prostate cancer Brother 711 . Prostate cancer Other        father's maternal half-brother  . Breast cancer Other 338      daughter of brother; currently 517  Social History   Substance and Sexual Activity  Sexual Activity No  . Partners: Female  . Birth control/protection: Surgical    Outpatient Encounter Medications as of 11/18/2016  Medication Sig  . ACETAMINOPHEN PO Take 650 mg by mouth 4 (four) times daily.  .Marland Kitchenatorvastatin (LIPITOR) 40 MG tablet Take 1 tablet (40 mg total) by mouth daily.  . Calcium Carb-Cholecalciferol (CALCIUM 600 + D PO) Take 1 tablet daily by mouth.  . cetirizine (ZYRTEC) 5 MG tablet Take 5 mg by mouth daily.  . fish oil-omega-3 fatty acids 1000 MG capsule Take 1-2 g by mouth 2 (two) times daily.   . furosemide (LASIX) 20 MG tablet Take 1 tablet (20  mg total) by mouth daily.  Marland Kitchen lisinopril (PRINIVIL,ZESTRIL) 40 MG tablet Take 1 tablet (40 mg total) by mouth daily.  . Multiple Vitamin (MULTIVITAMIN) tablet Take 1 tablet by mouth every morning.   . niacin 500 MG tablet Take 500 mg by mouth every evening.   [name] 20 MG TABS tablet TAKE 1 TABLET (20 MG TOTAL) BY MOUTH DAILY.   No facility-administered encounter medications on file as of 11/18/2016.     Activities of Daily Living In your present state of health, do you have any difficulty performing the following activities: 11/18/2016  Hearing? N  Vision? N  Difficulty concentrating or making decisions? N  Walking or climbing stairs? Y  Comment Patient has hip pain and has to use a cane at times.   Dressing or bathing? N  Doing errands, shopping? N  Preparing Food and eating ? N  Using the Toilet? N  In the past six months, have you accidently leaked urine? N    Do you have problems with loss of bowel control? N  Managing your Medications? N  Managing your Finances? N  Housekeeping or managing your Housekeeping? N  Some recent data might be hidden    Patient Care Team: Shawnee Knapp, MD as PCP - General (Family Medicine) Dohmeier, Asencion Partridge, MD as Consulting Physician (Neurology) Josue Hector, MD as Consulting Physician (Cardiology) Bo Merino, MD as Consulting Physician (Rheumatology) Bettina Gavia, [place] (Optometry) Megan Salon, MD as Consulting Physician (Gynecology)    Assessment:     Exercise Activities and Dietary recommendations Current Exercise Habits: Structured exercise class, Time (Minutes): 60, Frequency (Times/Week): 3, Weekly Exercise (Minutes/Week): 180, Intensity: Moderate, Exercise limited by: None identified  Goals    None     Fall Risk Fall Risk  11/18/2016 01/15/2016 12/29/2015 10/16/2015 10/10/2014  Falls in the past year? No No No No No   Depression Screen PHQ 2/9 Scores 11/18/2016 01/15/2016 12/29/2015 10/16/2015  PHQ - 2 Score 0 0 0 0     Cognitive Function     6CIT Screen 11/18/2016  What Year? 0 points  What month? 0 points  What time? 0 points  Count back from 20 0 points  Months in reverse 0 points  Repeat phrase 0 points  Total Score 0    Immunization History  Administered Date(s) Administered  . Influenza,inj,Quad PF,6+ Mos 09/18/2013, 10/10/2014, 10/16/2015, 11/18/2016  . Pneumococcal Conjugate-13 09/18/2013  . Pneumococcal Polysaccharide-23 10/10/2014  . Td 07/13/2014   Screening Tests Health Maintenance  Topic Date Due  . MAMMOGRAM  08/27/2018  . COLONOSCOPY  05/20/2024  . TETANUS/TDAP  07/12/2024  . INFLUENZA VACCINE  Completed  . DEXA SCAN  Completed  . Hepatitis C Screening  Completed  . PNA vac Low Risk Adult  Completed      Plan:   I have personally reviewed and noted the following in the patient's chart:   . Medical and social history . Use of alcohol, tobacco or  illicit drugs  . Current medications and supplements . Functional ability and status . Nutritional status . Physical activity . Advanced directives . List of other physicians . Hospitalizations, surgeries, and ER visits in previous 12 months . Vitals . Screenings to include cognitive, depression, and falls . Referrals and appointments  In addition, I have reviewed and discussed with patient certain preventive protocols, quality metrics, and best practice recommendations. A written personalized care plan for preventive services as well as general preventive health recommendations were provided to patient.  1. Hyperlipidemia, unspecified hyperlipidemia type - Lipid panel  2. Essential hypertension - Comprehensive metabolic panel - CBC with Differential/Platelet - Urinalysis, Complete  3. Encounter for Medicare annual wellness exam  4. Need for immunization against influenza - Flu Vaccine QUAD 6+ mos IM (Fluarix)  Andrez Grime, LPN  11/16/5206

## 2016-11-19 LAB — CBC WITH DIFFERENTIAL/PLATELET
BASOS ABS: 0 10*3/uL (ref 0.0–0.2)
Basos: 1 %
EOS (ABSOLUTE): 0 10*3/uL (ref 0.0–0.4)
Eos: 1 %
Hematocrit: 39.1 % (ref 34.0–46.6)
Hemoglobin: 13.3 g/dL (ref 11.1–15.9)
Immature Grans (Abs): 0 10*3/uL (ref 0.0–0.1)
Immature Granulocytes: 0 %
LYMPHS ABS: 1.4 10*3/uL (ref 0.7–3.1)
Lymphs: 36 %
MCH: 30 pg (ref 26.6–33.0)
MCHC: 34 g/dL (ref 31.5–35.7)
MCV: 88 fL (ref 79–97)
MONOCYTES: 9 %
MONOS ABS: 0.4 10*3/uL (ref 0.1–0.9)
NEUTROS PCT: 53 %
Neutrophils Absolute: 2.1 10*3/uL (ref 1.4–7.0)
PLATELETS: 196 10*3/uL (ref 150–379)
RBC: 4.44 x10E6/uL (ref 3.77–5.28)
RDW: 14.9 % (ref 12.3–15.4)
WBC: 3.9 10*3/uL (ref 3.4–10.8)

## 2016-11-19 LAB — COMPREHENSIVE METABOLIC PANEL
ALK PHOS: 91 IU/L (ref 39–117)
ALT: 23 IU/L (ref 0–32)
AST: 25 IU/L (ref 0–40)
Albumin/Globulin Ratio: 1.9 (ref 1.2–2.2)
Albumin: 4.6 g/dL (ref 3.6–4.8)
BUN/Creatinine Ratio: 25 (ref 12–28)
BUN: 20 mg/dL (ref 8–27)
Bilirubin Total: 0.7 mg/dL (ref 0.0–1.2)
CO2: 25 mmol/L (ref 20–29)
CREATININE: 0.81 mg/dL (ref 0.57–1.00)
Calcium: 10 mg/dL (ref 8.7–10.3)
Chloride: 103 mmol/L (ref 96–106)
GFR calc Af Amer: 86 mL/min/{1.73_m2} (ref >59)
GFR calc non Af Amer: 75 mL/min/{1.73_m2} (ref >59)
GLUCOSE: 93 mg/dL (ref 65–99)
Globulin, Total: 2.4 g/dL (ref 1.5–4.5)
Potassium: 4.1 mmol/L (ref 3.5–5.2)
SODIUM: 142 mmol/L (ref 134–144)
Total Protein: 7 g/dL (ref 6.0–8.5)

## 2016-11-19 LAB — LIPID PANEL
CHOLESTEROL TOTAL: 175 mg/dL (ref 100–199)
Chol/HDL Ratio: 3.4 ratio (ref 0.0–4.4)
HDL: 52 mg/dL (ref >39)
LDL CALC: 82 mg/dL (ref 0–99)
TRIGLYCERIDES: 204 mg/dL — AB (ref 0–149)
VLDL CHOLESTEROL CAL: 41 mg/dL — AB (ref 5–40)

## 2016-11-19 LAB — TSH: TSH: 2.04 u[IU]/mL (ref 0.450–4.500)

## 2016-11-20 MED ORDER — ATORVASTATIN CALCIUM 40 MG PO TABS: 40.0000 mg | ORAL_TABLET | Freq: Every day | ORAL | 3 refills | Status: DC

## 2016-11-30 ENCOUNTER — Telehealth: Payer: Self-pay | Admitting: Family Medicine

## 2016-11-30 NOTE — Telephone Encounter (Signed)
Please see note below. 

## 2016-11-30 NOTE — Telephone Encounter (Signed)
Copied from [place] [id number]. Topic: General - Other >> Nov 30, 2016  1:34 PM Oneta Rack wrote: Osvaldo Human name: Lavella Lemons  Relation to pt: Patient Coordination Gillian Scarce / Physical Therapy Call back number:  [phone] fax # [phone]   Reason for call:  Lavella Lemons states patient was informed referral was faxed but specialist never received, please fax to Physical Therapy department [phone], please advise

## 2016-11-30 NOTE — Telephone Encounter (Signed)
Re-faxing referral to fax provided 11/20

## 2016-12-10 ENCOUNTER — Telehealth: Payer: Self-pay

## 2016-12-10 NOTE — Telephone Encounter (Signed)
Physical therapist had called about referral for patient.

## 2016-12-18 ENCOUNTER — Other Ambulatory Visit: Payer: Self-pay | Admitting: Family Medicine

## 2016-12-20 NOTE — Progress Notes (Signed)
Office Visit Note  Patient: [name]             Date of Birth: 29-Jul-1950           MRN: [id number]             PCP: Shawnee Knapp, MD Referring: Shawnee Knapp, MD Visit Date: 12/30/2016 Occupation: '@GUAROCC' @    Subjective:  Knee pain.   History of Present Illness: [name] is a 66 y.o. female  with history of osteoarthritis and disc disease. She states she's been having a lot of discomfort in her bilateral knee joints especially her right knee joint. She feels it gives out on her at times. She's been also experiencing soreness in her lower extremity muscles. She continues to have discomfort in her multiple joints including her feet. She's been having nocturnal pain in her hips. Which she describes a were trochanteric bursa. She continues to have lower back discomfort. She has only off-and-on discomfort in her C-spine.  Activities of Daily Living:  Patient reports morning stiffness for 15 minutes.   Patient Denies nocturnal pain.  Difficulty dressing/grooming: Reports Difficulty climbing stairs: Reports Difficulty getting out of chair: Reports Difficulty using hands for taps, buttons, cutlery, and/or writing: Denies   Review of Systems  Constitutional: Negative.  Negative for fatigue, night sweats, weight gain, weight loss and weakness.  HENT: Negative for mouth sores, trouble swallowing, trouble swallowing, mouth dryness and nose dryness.   Eyes: Negative for pain, redness, visual disturbance and dryness.  Respiratory: Positive for shortness of breath. Negative for cough, apnea and difficulty breathing.   Cardiovascular: Negative.  Positive for hypertension. Negative for chest pain, palpitations, irregular heartbeat and swelling in legs/feet.  Gastrointestinal: Negative.  Negative for blood in stool, constipation and diarrhea.  Endocrine: Negative.  Negative for increased urination.  Genitourinary: Negative.  Negative for nocturia and vaginal dryness.    Musculoskeletal: Positive for arthralgias, joint pain, muscle weakness and morning stiffness. Negative for joint swelling, myalgias, muscle tenderness and myalgias.  Skin: Negative for color change, rash, hair loss, skin tightness, ulcers and sensitivity to sunlight.  Allergic/Immunologic: Negative for susceptible to infections.  Neurological: Positive for memory loss. Negative for dizziness, numbness, headaches and night sweats.  Hematological: Negative for bruising/bleeding tendency and swollen glands.  Psychiatric/Behavioral: Negative.  Negative for depressed mood and sleep disturbance. The patient is not nervous/anxious.     PMFS History:  Patient Active Problem List   Diagnosis Date Noted  . Genetic testing 11/04/2016  . Family history of breast cancer   . History of breast cancer   . DJD (degenerative joint disease), cervical 07/28/2016  . DDD (degenerative disc disease), lumbar 07/28/2016  . History of DVT (deep vein thrombosis) 07/28/2016  . History of ovarian cancer 07/28/2016  . Primary osteoarthritis of both knees 04/20/2016  . Primary osteoarthritis of both hands 04/20/2016  . Primary osteoarthritis of both feet 04/20/2016  . Primary osteoarthritis of both hips 04/20/2016  . Vitamin D deficiency 04/20/2016  . Primary open angle glaucoma of both eyes, moderate stage 12/30/2015  . Hyperlipidemia 10/15/2015  . Anemia 10/15/2015  . Sleep apnea with use of continuous positive airway pressure (CPAP) 07/22/2014  . OSA on CPAP 07/04/2013  . Obesity, morbid ([place]) 07/04/2013  . Morbid obesity ([place]) 11/24/2011  . Arthritis of knee 11/24/2011  . Class 3 severe obesity due to excess calories without serious comorbidity with body mass index (BMI) of 50.0 to 59.9 in adult ([place]) 12/04/2010  .  Sleep apnea 12/04/2010  . ELECTROCARDIOGRAM, ABNORMAL 06/02/2009  . Acute thromboembolism of deep veins of lower extremity ([place]) 03/26/2008  . EDEMA 03/26/2008  . ADENOCARCINOMA, OVARY  03/25/2008  . ESSENTIAL HYPERTENSION, BENIGN 03/25/2008    Past Medical History:  Diagnosis Date  . Anemia    oral iron occ.  . Arthritis   . Basal cell carcinoma of nose   . Bronchitis   . Cancer of ovary ([place]) 2005   Stage 1A, BRCA 1/2 neg 6/14  . Diverticulitis    past history  . DVT (deep vein thrombosis) in pregnancy ([place])    LEFT LEG  . Family history of breast cancer   . Femoral DVT (deep venous thrombosis) ([place]) 2008   -"wears compression hose all times" left leg  . FH: genetic disease carrier 11/05/2016  . Genetic testing 11/04/2016   Multi-Cancer panel (83 genes) @ [facility] - see report; special interpretation  . History of ovarian cancer   . HTN (hypertension)   . Hyperlipidemia   . OSA (obstructive sleep apnea)    hypoventilation, on CPAP  . Shingles     Family History  Problem Relation Age of Onset  . Alzheimer's disease Mother   . Hypertension Mother   . Alzheimer's disease Father   . Hypertension Father   . Colon cancer Father 38       deceased 53  . Hypertension Sister   . Breast cancer Sister 25  . Hypertension Brother   . Prostate cancer Brother 92  . Prostate cancer Other        father's maternal half-brother  . Breast cancer Other 55       daughter of brother; currently 41   Past Surgical History:  Procedure Laterality Date  . BASAL CELL CARCINOMA EXCISION     resection with plastic surgery reconstrustion  . bilateral breast mass  1970   benigned, left breast  . cataract surgery Bilateral [id number], 2005   with bilateral lens replacements  . COLONOSCOPY WITH PROPOFOL N/A 05/21/2014   Procedure: COLONOSCOPY WITH PROPOFOL;  Surgeon: Juanita Craver, MD;  Location: [facility];  Service: Endoscopy;  Laterality: N/A;  . HERNIA REPAIR  [id number]   umbilical repair with mesh   . 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 PSH showed an AHI of 120!!!! titrated to 8 cm watr cPAP , residual AHI of 1 and downlaod was done  01-23-11 .  excellent compliance - 7 hours and low leak,  nasal pillow mask.    BMI is considered morbidly obese.  discussed low carb  diet - weight watchers and restricted exercise. , aquatic .    Patient cannot exercise due to soreness in proximal muscles.and  would like to add coenzyme q 10 and carnitine . She needs a medical weight loss regimen - bariatric surgery?       FSS 48, Epworth 4 again ,  CMS compliant  residual AHI 1.1 and average use 6.48  hours , no naps    Patient is single and lives alone.   Patient does not have any children.   Patient is retired.   Patient has a Master's degree.   Patient is left-handed.   Patient drinks tea occasionally.               Objective: Vital Signs: BP Marland Kitchen)  145/77   Pulse 88   Resp 14   Ht '5\' 7"'  (1.702 m)   Wt (!) 312 lb (141.5 kg)   LMP 01/12/2003   BMI 48.87 kg/m    Physical Exam  Constitutional: She is oriented to person, place, and time. She appears well-developed and well-nourished.  HENT:  Head: Normocephalic and atraumatic.  Eyes: Conjunctivae and EOM are normal.  Neck: Normal range of motion.  Cardiovascular: Normal rate, regular rhythm, normal heart sounds and intact distal pulses.  Pulmonary/Chest: Effort normal and breath sounds normal.  Abdominal: Soft. Bowel sounds are normal.  Lymphadenopathy:    She has no cervical adenopathy.  Neurological: She is alert and oriented to person, place, and time.  Skin: Skin is warm and dry. Capillary refill takes less than 2 seconds.  Psychiatric: She has a normal mood and affect. Her behavior is normal.  Nursing note and vitals reviewed.    Musculoskeletal Exam: C-spine and thoracic lumbar spine limited range of motion with discomfort. Shoulder joints elbow joints wrist joints are good range of motion. She has DIP PIP thickening in her hands consistent  with osteoarthritis. She has limitation of range of motion of her hip joints due to underlying osteoarthritis. She has crepitus in her bilateral knee joints with discomfort on range of motion. No synovitis was noted. No lumps or swelling was noted. She is osteoarthritis changes in her feet.  CDAI Exam: No CDAI exam completed.    Investigation: No additional findings.   Imaging: No results found.  Speciality Comments: No specialty comments available.    Procedures:  No procedures performed Allergies: Valtrex [valacyclovir hcl]; Benoxinate base; Sulfonamide derivatives; and Ivp dye [iodinated diagnostic agents]   Assessment / Plan:     Visit Diagnoses: Primary osteoarthritis of both hands: She has DIP PIP thickening consistent with osteoarthritis. Joint protection and muscle strengthening discussed.  Primary osteoarthritis of both hips - bilateral moderate: Chronic pain  Primary osteoarthritis of both knees -  bilateral severe with severe bilateral chondromalacia patella. She had good response to Euflexxa injections in the past and requests repeat  Euflexxa injections. I will schedule Euflexxa in March. Weight loss diet and exercise discussed.  Primary osteoarthritis of both feet: She has been wearing proper fitting shoes which is been helpful.  DDD (degenerative disc disease), lumbar: Chronic pain she takes Tylenol for discomfort.  DDD (degenerative disc disease), cervical: She is alert range of motion discomfort.  History of vitamin D deficiency: She is on supplement.  History of hypertension: Her systolic pressure is mildly elevated.  Other medical problems are listed as follows:  History of hypercholesterolemia  History of DVT (deep vein thrombosis)  History of ovarian cancer  History of breast cancer  History of anemia  History of sleep apnea  History of hyperlipidemia    Orders: No orders of the defined types were placed in this encounter.  No orders of  the defined types were placed in this encounter.   Face-to-face time spent with patient was 30 minutes. Greater than 50% of time was spent in counseling and coordination of care.  Follow-Up Instructions: Return in about 6 months (around 06/30/2017) for Osteoarthritis, DDD.   Bo Merino, MD  Note - This record has been created using Editor, commissioning.  Chart creation errors have been sought, but may not always  have been located. Such creation errors do not reflect on  the standard of medical care.

## 2016-12-28 ENCOUNTER — Telehealth: Payer: Self-pay | Admitting: Family Medicine

## 2016-12-28 NOTE — Telephone Encounter (Signed)
Copied from [place]. Topic: General - Other >> Dec 28, 2016  4:20 PM Darl Householder, RMA wrote: Reason for CRM: Nikki from [facility] called to notify Dr. Brigitte Pulse that pt complains of Muscle weakness in legs but pt presents as if she has neurogenic claudication and long hx of back problems, they will try therapy and see how it goes , please contact [facility] at [phone]

## 2016-12-30 ENCOUNTER — Encounter: Payer: Self-pay | Admitting: Rheumatology

## 2016-12-30 ENCOUNTER — Ambulatory Visit: Payer: Medicare Other | Admitting: Rheumatology

## 2016-12-30 VITALS — BP 145/77 | HR 88 | Resp 14 | Ht 67.0 in | Wt 312.0 lb

## 2016-12-30 DIAGNOSIS — M19071 Primary osteoarthritis, right ankle and foot: Secondary | ICD-10-CM | POA: Diagnosis not present

## 2016-12-30 DIAGNOSIS — M5030 Other cervical disc degeneration, unspecified cervical region: Secondary | ICD-10-CM

## 2016-12-30 DIAGNOSIS — Z86718 Personal history of other venous thrombosis and embolism: Secondary | ICD-10-CM

## 2016-12-30 DIAGNOSIS — M160 Bilateral primary osteoarthritis of hip: Secondary | ICD-10-CM

## 2016-12-30 DIAGNOSIS — Z8543 Personal history of malignant neoplasm of ovary: Secondary | ICD-10-CM

## 2016-12-30 DIAGNOSIS — Z862 Personal history of diseases of the blood and blood-forming organs and certain disorders involving the immune mechanism: Secondary | ICD-10-CM

## 2016-12-30 DIAGNOSIS — M170 Bilateral primary osteoarthritis of knee: Secondary | ICD-10-CM | POA: Diagnosis not present

## 2016-12-30 DIAGNOSIS — Z8679 Personal history of other diseases of the circulatory system: Secondary | ICD-10-CM | POA: Diagnosis not present

## 2016-12-30 DIAGNOSIS — Z853 Personal history of malignant neoplasm of breast: Secondary | ICD-10-CM

## 2016-12-30 DIAGNOSIS — Z8639 Personal history of other endocrine, nutritional and metabolic disease: Secondary | ICD-10-CM

## 2016-12-30 DIAGNOSIS — M19041 Primary osteoarthritis, right hand: Secondary | ICD-10-CM

## 2016-12-30 DIAGNOSIS — M19072 Primary osteoarthritis, left ankle and foot: Secondary | ICD-10-CM

## 2016-12-30 DIAGNOSIS — M5136 Other intervertebral disc degeneration, lumbar region: Secondary | ICD-10-CM

## 2016-12-30 DIAGNOSIS — M19042 Primary osteoarthritis, left hand: Secondary | ICD-10-CM

## 2016-12-30 DIAGNOSIS — Z8669 Personal history of other diseases of the nervous system and sense organs: Secondary | ICD-10-CM

## 2016-12-30 NOTE — Telephone Encounter (Signed)
Thanks for the info! Very helpful.  Rec pt f/u in office to discuss repeat imaging of her back and discuss poss neurosurgery/orthospine/neurology eval.

## 2016-12-31 NOTE — Telephone Encounter (Signed)
Please call pt and schedule appt for Dr. Brigitte Pulse Per Dr. Brigitte Pulse from prior phone messages.

## 2017-01-11 DIAGNOSIS — M8580 Other specified disorders of bone density and structure, unspecified site: Secondary | ICD-10-CM

## 2017-01-11 DIAGNOSIS — M160 Bilateral primary osteoarthritis of hip: Secondary | ICD-10-CM

## 2017-01-11 HISTORY — DX: Other specified disorders of bone density and structure, unspecified site: M85.80

## 2017-01-11 HISTORY — DX: Bilateral primary osteoarthritis of hip: M16.0

## 2017-01-25 ENCOUNTER — Ambulatory Visit: Payer: Medicare Other | Admitting: Obstetrics & Gynecology

## 2017-02-10 ENCOUNTER — Telehealth: Payer: Self-pay | Admitting: *Deleted

## 2017-02-10 NOTE — Telephone Encounter (Addendum)
Last inj. 10/28/16 - next inj should be 04/28/17 or later. Please call patient to schedule appt, patient's Euflexxa delivered to office 02/09/17, patient purchased, bilateral, LMOM for patient to call to schedule appts x 3 w/[facility]. Thank you.

## 2017-02-17 ENCOUNTER — Encounter: Payer: Self-pay | Admitting: Family Medicine

## 2017-02-17 ENCOUNTER — Ambulatory Visit (INDEPENDENT_AMBULATORY_CARE_PROVIDER_SITE_OTHER): Payer: Medicare Other

## 2017-02-17 ENCOUNTER — Ambulatory Visit: Payer: Medicare Other | Admitting: Family Medicine

## 2017-02-17 VITALS — BP 118/72 | HR 101 | Temp 98.1°F | Resp 18 | Ht 67.0 in | Wt 304.2 lb

## 2017-02-17 DIAGNOSIS — M160 Bilateral primary osteoarthritis of hip: Secondary | ICD-10-CM | POA: Diagnosis not present

## 2017-02-17 DIAGNOSIS — R29898 Other symptoms and signs involving the musculoskeletal system: Secondary | ICD-10-CM

## 2017-02-17 DIAGNOSIS — E782 Mixed hyperlipidemia: Secondary | ICD-10-CM

## 2017-02-17 NOTE — Progress Notes (Signed)
Subjective:  By signing my name below, I, [name], attest that this documentation has been prepared under the direction and in the presence of Delman Cheadle, MD Electronically Signed: Ladene Artist, ED Scribe 02/17/2017 at 2:28 PM.   Patient ID: [name], female    DOB: [birth year], 67 y.o.   MRN: [id number]  Chief Complaint  Patient presents with  . Hip and Back Issues    Pt states hip and back are somewhat better. Pt states she tylenol for the pain but doesn't help as much as she would like.  . Follow-up   HPI [name] is a 66 y.o. female who presents to Primary Care at [facility] for f/u on hip and back pain. Long-standing mobility issues. Wears orthotic shoes. Has had knee injections. Aquatic and regular PT prior. Sees Dr. Estanislado Pandy. Bursitis and arthritis in hips, knees, feet and low back which causes problems walking. Diffuse LE muscle weakness and loss every since she was bedridden for DVT in  2008. Dependant upon a cane. Just finished another round of PT with [facility].  Pt states that pressure in her back and sharp sensation in her buttock have improved with stretches, however, she still has a lot of pain deep inside of her L hip and towards the front of her groin. Last had imagining in 2012. Pt states that she feels that her R leg has gotten stronger and she no longer has to tug on the walls when walking up 1-2 stairs. Pt has had 2 bursa injections which pt states were ineffective.Currently taking Tylenol for pain. Reports that she has gradually changed her diet and noticed some intentional weight loss with this.  Wt Readings from Last 3 Encounters:  02/17/17 (!) 304 lb 3.2 oz (138 kg)  12/30/16 (!) 312 lb (141.5 kg)  11/18/16 (!) 308 lb 6.4 oz (139.9 kg)   Immunization History  Administered Date(s) Administered  . Influenza,inj,Quad PF,6+ Mos 09/18/2013, 10/10/2014, 10/16/2015, 11/18/2016  . Pneumococcal Conjugate-13 09/18/2013  . Pneumococcal  Polysaccharide-23 10/10/2014  . Td 07/13/2014  Shingles: on waiting list at [facility]  Past Medical History:  Diagnosis Date  . Anemia    oral iron occ.  . Arthritis   . Basal cell carcinoma of nose   . Bronchitis   . Cancer of ovary ([place]) 2005   Stage 1A, BRCA 1/2 neg 6/14  . Diverticulitis    past history  . DVT (deep vein thrombosis) in pregnancy ([place])    LEFT LEG  . Family history of breast cancer   . Femoral DVT (deep venous thrombosis) ([place]) 2008   -"wears compression hose all times" left leg  . FH: genetic disease carrier 11/05/2016  . Genetic testing 11/04/2016   Multi-Cancer panel (83 genes) @ [facility] - see report; special interpretation  . History of ovarian cancer   . HTN (hypertension)   . Hyperlipidemia   . OSA (obstructive sleep apnea)    hypoventilation, on CPAP  . Shingles    Current Outpatient Medications on File Prior to Visit  Medication Sig Dispense Refill  . ACETAMINOPHEN PO Take 650 mg by mouth 4 (four) times daily.    Marland Kitchen atorvastatin (LIPITOR) 40 MG tablet Take 1 tablet (40 mg total) daily by mouth. 90 tablet 3  . Calcium Carb-Cholecalciferol (CALCIUM 600 + D PO) Take 1 tablet daily by mouth.    . cetirizine (ZYRTEC) 5 MG tablet Take 5 mg by mouth daily.    . fish oil-omega-3 fatty acids 1000  MG capsule Take 1-2 g by mouth 2 (two) times daily.     . fluticasone (FLONASE) 50 MCG/ACT nasal spray Place 2 sprays at bedtime into both nostrils. 16 g 5  . Multiple Vitamin (MULTIVITAMIN) tablet Take 1 tablet by mouth every morning.     . niacin 500 MG tablet Take 500 mg by mouth every evening.     [name] 20 MG TABS tablet TAKE 1 TABLET (20 MG TOTAL) BY MOUTH DAILY. 90 tablet 1  . EUFLEXXA 20 MG/2ML SOSY     . furosemide (LASIX) 20 MG tablet Take 1 tablet (20 mg total) by mouth daily. 90 tablet 3  . lisinopril (PRINIVIL,ZESTRIL) 40 MG tablet Take 1 tablet (40 mg total) by mouth daily. 90 tablet 3   No current facility-administered medications on file prior to  visit.    Allergies  Allergen Reactions  . Valtrex [Valacyclovir Hcl] Swelling and Rash    Swelling of face and arm, rash  . Benoxinate Base     Turns eyes blood red/burning. --in eyedrops--  . Sulfonamide Derivatives   . Ivp Dye [Iodinated Diagnostic Agents] Rash    Rash on back   Past Surgical History:  Procedure Laterality Date  . BASAL CELL CARCINOMA EXCISION     resection with plastic surgery reconstrustion  . bilateral breast mass  1970   benigned, left breast  . cataract surgery Bilateral [id number], 2005   with bilateral lens replacements  . COLONOSCOPY WITH PROPOFOL N/A 05/21/2014   Procedure: COLONOSCOPY WITH PROPOFOL;  Surgeon: Juanita Craver, MD;  Location: [facility];  Service: Endoscopy;  Laterality: N/A;  . HERNIA REPAIR  6789   umbilical repair with mesh   . TOTAL ABDOMINAL HYSTERECTOMY W/ BILATERAL SALPINGOOPHORECTOMY  2005   Stage IA ovarian cancer   Family History  Problem Relation Age of Onset  . Alzheimer's disease Mother   . Hypertension Mother   . Alzheimer's disease Father   . Hypertension Father   . Colon cancer Father 36       deceased 44  . Hypertension Sister   . Breast cancer Sister 54  . Hypertension Brother   . Prostate cancer Brother 21  . Prostate cancer Other        father's maternal half-brother  . Breast cancer Other 62       daughter of brother; currently 67   Social History   Socioeconomic History  . Marital status: Single    Spouse name: None  . Number of children: 0  . Years of education: masters  . Highest education level: Master's degree (e.g., MA, MS, MEng, MEd, MSW, MBA)  Social Needs  . Financial resource strain: Not hard at all  . Food insecurity - worry: Never true  . Food insecurity - inability: Never true  . Transportation needs - medical: No  . Transportation needs - non-medical: No  Occupational History  . Occupation: Clinical research associate: [employer]    Comment: fifth grade (retired)  Tobacco  Use  . Smoking status: Never Smoker  . Smokeless tobacco: Never Used  . Tobacco comment: only for 6 months in the 70s  Substance and Sexual Activity  . Alcohol use: Yes    Alcohol/week: 0.0 oz    Comment: rare, 2 yearly  . Drug use: No  . Sexual activity: No    Partners: Female    Birth control/protection: Surgical  Other Topics Concern  . 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 PSH showed an AHI of 120!!!! titrated to 8 cm watr cPAP , residual AHI of 1 and downlaod was done  01-23-11 .  excellent compliance - 7 hours and low leak,  nasal pillow mask.    BMI is considered morbidly obese.  discussed low carb  diet - weight watchers and restricted exercise. , aquatic .    Patient cannot exercise due to soreness in proximal muscles.and  would like to add coenzyme q 10 and carnitine . She needs a medical weight loss regimen - bariatric surgery?       FSS 48, Epworth 4 again ,  CMS compliant  residual AHI 1.1 and average use 6.48  hours , no naps    Patient is single and lives alone.   Patient does not have any children.   Patient is retired.   Patient has a Master's degree.   Patient is left-handed.   Patient drinks tea occasionally.             Depression screen [facility] 2/9 02/17/2017 11/18/2016 01/15/2016 12/29/2015 10/16/2015  Decreased Interest 0 0 0 0 0  Down, Depressed, Hopeless 0 0 0 0 0  PHQ - 2 Score 0 0 0 0 0    Review of Systems  Musculoskeletal: Positive for arthralgias and back pain.      Objective:   Physical Exam  Constitutional: She is oriented to person, place, and time. She appears well-developed and well-nourished. No distress.  HENT:  Head: Normocephalic and atraumatic.  Eyes: Conjunctivae and EOM are normal.  Neck: Neck supple. No tracheal deviation present.  Cardiovascular: Normal rate.  Pulmonary/Chest: Effort normal. No respiratory distress.  Musculoskeletal: Normal range of motion.  Tenderness over L  bursa. Severe reduction of internal/external rotation, moderate reduction of hip flexion to 90 degrees. Same bilaterally.  Neurological: She is alert and oriented to person, place, and time.  Skin: Skin is warm and dry.  Psychiatric: She has a normal mood and affect. Her behavior is normal.  Nursing note and vitals reviewed.  Vitals:   02/17/17 1404  BP: 118/72  Pulse: (!) 101  Resp: 18  Temp: 98.1 F (36.7 C)  TempSrc: Oral  SpO2: 95%  Weight: (!) 304 lb 3.2 oz (138 kg)  Height: '5\' 7"'  (1.702 m)   Dg Si Joints  Result Date: 02/17/2017 CLINICAL DATA:  Pain in LEFT hip, SI joint, and gluteal region EXAM: BILATERAL SACROILIAC JOINTS - 3+ VIEW COMPARISON:  CT pelvis 12/25/2011 FINDINGS: Diffuse osseous demineralization. SI joint spaces preserved. Advanced BILATERAL degenerative changes of the hip joints. Degenerative facet disease changes of the visualized lumbar spine. IMPRESSION: No definite SI joint abnormalities. Advanced degenerative changes of both hip joints. Electronically Signed   By: Lavonia Dana M.D.   On: 02/17/2017 15:14   Dg Hip Unilat W Or W/o Pelvis 2-3 Views Left  Result Date: 02/17/2017 CLINICAL DATA:  Pain in LEFT hip, SI joint, and gluteal region EXAM: DG HIP (WITH OR WITHOUT PELVIS) 2-3V LEFT COMPARISON:  None FINDINGS: Diffuse osseous demineralization. SI joints preserved. Advanced osteoarthritic changes of both hip joints with joint space narrowing and spur formation. No acute fracture, dislocation, or bone destruction. Facet degenerative changes at visualized lower lumbar spine. IMPRESSION: Advanced osteoarthritic changes of both hip joints. Electronically Signed   By: Lavonia Dana M.D.   On: 02/17/2017 15:15     Assessment & Plan:   1. Primary osteoarthritis of both hips -  refer to orthopedics Dr. Norm Salt to see if pt would be a candidate for intraarticular injections and discuss when/if she would be candidate for joint replacement.  2. Left leg weakness   3.       Mixed hyperlipidemia -  Has been on atorvastatin for many yrs - >5 yrs - unlikely to be contributing to leg weakness since sxs are asymmetrical but will ensure it is not by doing 1 mo trial off and then restart.  Orders Placed This Encounter  Procedures  . DG Si Joints    Standing Status:   Future    Number of Occurrences:   1    Standing Expiration Date:   02/17/2018    Order Specific Question:   Reason for Exam (SYMPTOM  OR DIAGNOSIS REQUIRED)    Answer:   left hip, SI, and glut pain    Order Specific Question:   Preferred imaging location?    Answer:   External  . DG HIP UNILAT W OR W/O PELVIS 2-3 VIEWS LEFT    Standing Status:   Future    Number of Occurrences:   1    Standing Expiration Date:   02/17/2018    Order Specific Question:   Reason for Exam (SYMPTOM  OR DIAGNOSIS REQUIRED)    Answer:   left hip, SI, and glut pain    Order Specific Question:   Preferred imaging location?    Answer:   External  . CK  . Ambulatory referral to Orthopedic Surgery    Referral Priority:   Routine    Referral Type:   Surgical    Referral Reason:   Specialty Services Required    Requested Specialty:   Orthopedic Surgery    Number of Visits Requested:   1    I personally performed the services described in this documentation, which was scribed in my presence. The recorded information has been reviewed and considered, and addended by me as needed.   Delman Cheadle, M.D.  Primary Care at [facility] [address] [place], [place] [address] [phone] phone [phone] fax  02/20/17 11:13 AM

## 2017-02-17 NOTE — Patient Instructions (Addendum)
Lets do a 1 month trial off of the lipitor/atorvastatin.      IF you received an x-ray today, you will receive an invoice from [facility]. Please contact [facility] at [phone] with questions or concerns regarding your invoice.   IF you received labwork today, you will receive an invoice from [place]. Please contact [facility] at [phone] with questions or concerns regarding your invoice.   Our billing staff will not be able to assist you with questions regarding bills from these companies.  You will be contacted with the lab results as soon as they are available. The fastest way to get your results is to activate your My Chart account. Instructions are located on the last page of this paperwork. If you have not heard from [place] regarding the results in 2 weeks, please contact this office.     Hip Pain The hip is the joint between the upper legs and the lower pelvis. The bones, cartilage, tendons, and muscles of your hip joint support your body and allow you to move around. Hip pain can range from a minor ache to severe pain in one or both of your hips. The pain may be felt on the inside of the hip joint near the groin, or the outside near the buttocks and upper thigh. You may also have swelling or stiffness. Follow these instructions at home: Managing pain, stiffness, and swelling  If directed, apply ice to the injured area. ? Put ice in a plastic bag. ? Place a towel between your skin and the bag. ? Leave the ice on for 20 minutes, 2-3 times a day  Sleep with a pillow between your legs on your most comfortable side.  Avoid any activities that cause pain. General instructions  Take over-the-counter and prescription medicines only as told by your health care provider.  Do any exercises as told by your health care provider.  Record the following: ? How often you have hip pain. ? The location of your pain. ? What the pain feels like. ? What makes the pain  worse.  Keep all follow-up visits as told by your health care provider. This is important. Contact a health care provider if:  You cannot put weight on your leg.  Your pain or swelling continues or gets worse after one week.  It gets harder to walk.  You have a fever. Get help right away if:  You fall.  You have a sudden increase in pain and swelling in your hip.  Your hip is red or swollen or very tender to touch. Summary  Hip pain can range from a minor ache to severe pain in one or both of your hips.  The pain may be felt on the inside of the hip joint near the groin, or the outside near the buttocks and upper thigh.  Avoid any activities that cause pain.  Record how often you have hip pain, the location of the pain, what makes it worse and what it feels like. This information is not intended to replace advice given to you by your health care provider. Make sure you discuss any questions you have with your health care provider. Document Released: 06/17/2009 Document Revised: 12/01/2015 Document Reviewed: 12/01/2015 Elsevier Interactive Patient Education  2018 [employer].  Preparing for Hip Replacement Recovery from hip replacement surgery can be made easier and more comfortable by being prepared before surgery. This includes:  Arranging for others to help you.  Preparing your home.  Preparing your body by  having a preoperative exam and being as healthy as you can.  Doing exercises before your surgery as directed by your health care provider.  You can ease any concerns about your financial responsibilities by calling your insurance company after you decide to have surgery. In addition to asking about your surgery and hospital stay, you will want to ask about coverage for medical equipment, rehabilitation facilities, and home care. How should I arrange for help? You will be stronger and more mobile every day. However, in the first couple weeks after surgery, it is  unlikely you will be able to do all your daily activities as easily as before your surgery. You may tire easily and will still have limited movement in your leg. Follow these guidelines to best arrange for the help you may need after your surgery:  Plan to have someone take you home after the procedure. Your health care provider will be able to tell you how many days you can expect to be in the hospital.  Cancel all work, caregiving, and volunteer responsibilities for at least 4-6 weeks after surgery.  If you live alone, arrange for someone to care for your home and pets for the first 4-6 weeks after surgery.  Select someone with whom you feel comfortable to be with you day and night for the first week. This person will help you with your exercises and personal care, such as bathing and using the toilet.  Arrange for drivers to bring you to and from your follow-up appointments, the grocery store, and other places you may need to go for at least 4-6 weeks.  How should I prepare my home?  Pick a recovery spot, but do not plan on recovering in bed. Sitting in a more upright position is better for your health. You may want to use a recliner with a small table nearby. Choose a chair with a firm seat that will not allow you to sink down into it. Chairs and sofas that are too soft can allow your hip to bend at an angle greater than 90 degrees. This could put you at risk for dislocating your new hip joint. Place the items you use most frequently on the small table. These may include the TV remote, a cordless phone, a book or laptop computer, a water glass, and any other items of your choice.  Remove all clutter from your floors. Also remove any throw rugs.  To see if you will be able to move in your home with a wheeled walker, hold your hands out about 6 inches (15 cm) from your sides. Walk from your recovery spot to your kitchen and bathroom. Then walk from your bed to the bathroom. If you do not hit  anything with your hands, you have enough room.  Move the items you use most often in your kitchen, bathroom, and bedroom to shelves and drawers that are at countertop height.  Prepare a few meals to freeze and reheat later.  Consider adding grab bars in the shower and near the toilet.  While you are in the hospital, you will learn about equipment that can be helpful for your recovery. Some of the equipment includes raised toilet seats, tub benches, and shower benches. How should I prepare my body?  Have a preoperative exam. This will ensure that your body is healthy enough to safely have this surgery. Bring a complete list of all your medicines and supplements, including herbs and vitamins. You may need to have additional tests to  ensure your safety.  Have elective dental care and routine cleanings before your surgery. Germs from anywhere in your body, including your mouth, can travel to your new joint and infect it. It is important not to have any dental work performed for at least 3 months after your surgery. After surgery, be sure to tell your dentist about your joint replacement.  Maintain a healthy diet. Do not change your diet before surgery unless advised to do so by your health care provider.  Do not use any tobacco products, including cigarettes, chewing tobacco, or electronic cigarettes. If you need help quitting, ask your health care provider. Tobacco and nicotine products can delay healing after your surgery.  The day before your surgery, follow your health care provider's directions for showering, eating, drinking, and taking medicines. These directions are for your safety. What kinds of exercises should I do? Your health care provider may have you do the following exercises before your surgery. Be sure to follow the exercise program only as directed by your health care provider. While completing these exercises, remember to stretch for as long as you can, up to 30 seconds. You  should only feel a gentle lengthening or release in the stretched tissue. You should not feel pain. Ankle Pumps 1. While sitting on a firm surface with your legs straight out in front of you, move your feet at the ankle joints so that your toes are pulling back toward your chest. 2. Reverse the motion, pointing your toes away from you. 3. Repeat 10-20 times. Complete this exercise 1-2 times per day.  Heel Slides 1. Lie on your back with both knees straight. (If this causes back pain, bend one knee, placing your foot flat on the floor. Keep this leg in this position while doing heel slides with the opposite leg.) 2. Slowly slide one heel back toward your buttocks until you feel a gentle stretch in the front of your knee or thigh. 3. Slowly slide your heel back to the starting position. 4. Repeat 10-20 times, then switch heels and do it again. Complete this exercise 1-2 times per day.  Quadriceps Sets 1. Lie on your back with one leg extended and your opposite knee bent. 2. Gradually tighten the muscles in the front of the thigh of your extended leg. This motion will push the back of the knee down toward the floor. 3. Hold the muscle as tightly as you can without causing pain for 10 seconds. 4. Relax the muscles slowly and completely in between each repetition. 5. Repeat 10-20 times, then switch legs and do it again. Complete this exercise 1-2 times per day.  Short Arc Kicks 1. Lie on your back. Place a rolled towel (4-6 inches [10-15 cm] high) under one knee so that the knee slightly bends. 2. Raise only the lower leg of your slightly bent leg by tightening the muscles in the front of your thigh. Do not allow your thigh to rise. 3. Hold this position for 5 seconds. 4. Repeat 10-20 times, then switch legs and do it again. Complete this exercise 1-2 times per day.  Straight Leg Raises 1. Lie on your back with one leg extended and your opposite knee bent. 2. Tighten the muscles in the front of  the thigh of your extended leg. Your thigh may shake slightly. 3. Tighten these muscles even more and raise your leg 4-6 inches off the floor. Hold for 3-5 seconds. 4. Keeping these muscles tight, lower your leg. 5. Relax the  muscles slowly and completely in between each repetition. 6. Repeat 10-20 times, then switch legs and do it again. Complete this exercise 1-2 times per day.  Arm Chair Push-ups 1. Find a firm, non-wheeled chair with solid armrests. 2. Sitting in the chair, extend one leg straight out in front of you. 3. Lift up your body weight, using your arms and opposite leg. 4. Slowly lower your body weight. 5. Repeat 10-20 times, then switch legs and do it again. Complete this exercise 1-2 times per day.  This information is not intended to replace advice given to you by your health care provider. Make sure you discuss any questions you have with your health care provider. Document Released: 04/03/2010 Document Revised: 06/02/2015 Document Reviewed: 03/22/2013 Elsevier Interactive Patient Education  [employer].

## 2017-02-18 LAB — CK: Total CK: 54 U/L (ref 24–173)

## 2017-03-01 ENCOUNTER — Ambulatory Visit: Payer: Medicare Other | Admitting: Physician Assistant

## 2017-03-01 ENCOUNTER — Encounter: Payer: Self-pay | Admitting: Physician Assistant

## 2017-03-01 VITALS — BP 142/94 | HR 98 | Temp 98.7°F | Resp 17 | Ht 67.0 in | Wt 303.0 lb

## 2017-03-01 DIAGNOSIS — L723 Sebaceous cyst: Secondary | ICD-10-CM | POA: Diagnosis not present

## 2017-03-01 NOTE — Patient Instructions (Addendum)
WOUND CARE Please return in 10 days to have your stitches/staples removed or sooner if you have concerns. . Keep area clean and dry for 24 hours. Do not remove bandage, if applied. . After 24 hours, remove bandage and wash wound gently with mild soap and warm water. Reapply a new bandage after cleaning wound, if directed. . Continue daily cleansing with soap and water until stitches/staples are removed. . Do not apply any ointments or creams to the wound while stitches/staples are in place, as this may cause delayed healing. . Notify the office if you experience any of the following signs of infection: Swelling, redness, pus drainage, streaking, fever >101.0 F . Notify the office if you experience excessive bleeding that does not stop after 15-20 minutes of constant, firm pressure.      IF you received an x-ray today, you will receive an invoice from [facility]. Please contact [facility] at [phone] with questions or concerns regarding your invoice.   IF you received labwork today, you will receive an invoice from [facility]. Please contact [facility] at [phone] with questions or concerns regarding your invoice.   Our billing staff will not be able to assist you with questions regarding bills from these companies.  You will be contacted with the lab results as soon as they are available. The fastest way to get your results is to activate your My Chart account. Instructions are located on the last page of this paperwork. If you have not heard from us regarding the results in 2 weeks, please contact this office.      

## 2017-03-01 NOTE — Progress Notes (Signed)
    03/01/2017 3:03 PM   DOB: 1950/03/30 / MRN: [id number]  SUBJECTIVE:  [name] is a 67 y.o. female presenting for mass in the skin about the upper back.  She was advised by Dr. Brigitte Pulse to come in and have this removed at her convenience.  She denies any pain about the lesion.  She is allergic to valtrex [valacyclovir hcl]; benoxinate base; sulfonamide derivatives; and ivp dye [iodinated diagnostic agents].    Review of Systems  Constitutional: Negative for chills, diaphoresis and fever.  Gastrointestinal: Negative for nausea.  Skin: Negative for itching.  Neurological: Negative for dizziness.    The problem list and medications were reviewed and updated by myself where necessary and exist elsewhere in the encounter.   OBJECTIVE:  BP (!) 142/94   Pulse 98   Temp 98.7 F (37.1 C) (Oral)   Resp 17   Ht 5\' 7"  (1.702 m)   Wt (!) 303 lb (137.4 kg)   LMP 01/12/2003   SpO2 98%   BMI 47.46 kg/m   Physical Exam    Procedure: Benefits and risks discussed and verbal consent obtained.  Patient anesthetized with 2% lidocaine with epinephrine and Marcaine at a one-to-one mix with roughly 3 cc total.  The wound was sterilized with Betadine.  A sterile drape was placed.  Using a 15 blade a 1 cm incision was made through the punctum and sebaceous material and sac like structure were removed in entirety.  The wound was repaired with 1 horizontal mattress suture using 4-0 Ethilon suture material.  The patient tolerated the procedure without complaint.  An antibiotic dressing was applied.  No results found for this or any previous visit (from the past 72 hour(s)).  No results found.  ASSESSMENT AND PLAN:  [name] was seen today for cyst.  Diagnoses and all orders for this visit:  Sebaceous cyst: Removed.  Wound sutured per procedure note.  She will come back in 10 days for suture removal.  If she has any problems with the wound prior to this she will come back in  immediately.    The patient is advised to call or return to clinic if she does not see an improvement in symptoms, or to seek the care of the closest emergency department if she worsens with the above plan.   Philis Fendt, MHS, PA-C [facility] 03/01/2017 3:03 PM

## 2017-03-04 ENCOUNTER — Telehealth: Payer: Self-pay

## 2017-03-04 ENCOUNTER — Ambulatory Visit (INDEPENDENT_AMBULATORY_CARE_PROVIDER_SITE_OTHER): Payer: Medicare Other | Admitting: Physician Assistant

## 2017-03-04 ENCOUNTER — Encounter: Payer: Self-pay | Admitting: Physician Assistant

## 2017-03-04 VITALS — BP 130/80 | HR 89 | Temp 98.4°F | Resp 17 | Ht 67.0 in | Wt 302.0 lb

## 2017-03-04 DIAGNOSIS — L723 Sebaceous cyst: Secondary | ICD-10-CM

## 2017-03-04 DIAGNOSIS — Z4889 Encounter for other specified surgical aftercare: Secondary | ICD-10-CM

## 2017-03-04 NOTE — Telephone Encounter (Signed)
Phone call to patient to assess if she has changed bandage since 03/01/17 appointment, patient not available.   When patient calls back, if she has not changed bandage since 03/01/17 visit, please schedule patient for office visit today with Philis Fendt.   Copied from [place]. Topic: Appointment Scheduling - Scheduling Inquiry for Clinic >> Mar 04, 2017  9:25 AM Synthia Innocent wrote: Reason for CRM: Had cyst removed from back on 03/01/17, would she be able to come today and have bandage changed? Please advise

## 2017-03-04 NOTE — Progress Notes (Signed)
Here for bandage change status post sebaceous cystectomy.  She feels well denies complaint.  Bandage removed.  There is no erythema, tenderness, exudate, swelling.  One horizontal mattress suture remains in place.  The wound was cleaned with water and a 4 x 4 and no bandage was placed.  Advised that she come back at the 10-day mark for suture removal.  Philis Fendt PA-C.

## 2017-03-04 NOTE — Patient Instructions (Signed)
     IF you received an x-ray today, you will receive an invoice from [facility]. Please contact [facility] at [phone] with questions or concerns regarding your invoice.   IF you received labwork today, you will receive an invoice from [facility]. Please contact [facility] at [phone] with questions or concerns regarding your invoice.   Our billing staff will not be able to assist you with questions regarding bills from these companies.  You will be contacted with the lab results as soon as they are available. The fastest way to get your results is to activate your My Chart account. Instructions are located on the last page of this paperwork. If you have not heard from us regarding the results in 2 weeks, please contact this office.     

## 2017-03-11 ENCOUNTER — Other Ambulatory Visit: Payer: Self-pay

## 2017-03-11 ENCOUNTER — Ambulatory Visit: Payer: Medicare Other

## 2017-03-11 ENCOUNTER — Encounter: Payer: Self-pay | Admitting: Physician Assistant

## 2017-03-11 ENCOUNTER — Ambulatory Visit (INDEPENDENT_AMBULATORY_CARE_PROVIDER_SITE_OTHER): Payer: Medicare Other | Admitting: Physician Assistant

## 2017-03-11 DIAGNOSIS — Z4802 Encounter for removal of sutures: Secondary | ICD-10-CM

## 2017-03-11 NOTE — Progress Notes (Signed)
67 year old female status post sebaceous cystectomy roughly 10 days ago here for suture removal.  Wound is clean, dry, and well approximated.  1 horizontal mattress suture is in place and removed without difficulty.  Wound is nontender.  Advised that she come back as needed for this.  Philis Fendt PA-C.  March 11, 2017

## 2017-03-18 ENCOUNTER — Other Ambulatory Visit: Payer: Self-pay | Admitting: Family Medicine

## 2017-04-20 ENCOUNTER — Other Ambulatory Visit: Payer: Self-pay | Admitting: Family Medicine

## 2017-04-20 NOTE — Telephone Encounter (Signed)
fluticasone refill Last OV: 11/18/16 Last Refill:03/18/17 Pharmacy:[facility] [address] PCP: Dr Delman Cheadle  Pt requesting refills and was refilled in March.

## 2017-04-28 ENCOUNTER — Ambulatory Visit: Payer: Medicare Other | Admitting: Physician Assistant

## 2017-04-29 ENCOUNTER — Ambulatory Visit: Payer: Medicare Other | Admitting: Physician Assistant

## 2017-05-05 ENCOUNTER — Other Ambulatory Visit: Payer: Self-pay | Admitting: Cardiovascular Disease

## 2017-05-05 NOTE — Telephone Encounter (Signed)
AGE 67 YEARS Wt 135kg Saw Dr Johnsie Cancel on 11/09/2016 11/18/2016 SrCr 0.81 11/18/2016 Hgb 13.3 HCT 39.1 CrCl 143.76 Refill done for Xarelto 20 mg daily as requested

## 2017-05-06 ENCOUNTER — Ambulatory Visit: Payer: Medicare Other | Admitting: Physician Assistant

## 2017-05-06 DIAGNOSIS — M1712 Unilateral primary osteoarthritis, left knee: Secondary | ICD-10-CM | POA: Diagnosis not present

## 2017-05-06 DIAGNOSIS — M1711 Unilateral primary osteoarthritis, right knee: Secondary | ICD-10-CM

## 2017-05-06 DIAGNOSIS — M170 Bilateral primary osteoarthritis of knee: Secondary | ICD-10-CM

## 2017-05-06 MED ORDER — LIDOCAINE HCL 1 % IJ SOLN
1.5000 mL | INTRAMUSCULAR | Status: AC | PRN
  Administered 2017-05-06: 1.5 mL

## 2017-05-06 MED ORDER — SODIUM HYALURONATE (VISCOSUP) 20 MG/2ML IX SOSY
20.0000 mg | PREFILLED_SYRINGE | INTRA_ARTICULAR | Status: AC | PRN
  Administered 2017-05-06: 20 mg via INTRA_ARTICULAR

## 2017-05-06 NOTE — Progress Notes (Signed)
   Procedure Note  Patient: [name]             Date of Birth: [birth year]           MRN: [id number]             Visit Date: 05/06/2017  Procedures: Visit Diagnoses: Primary osteoarthritis of both knees Euflexxa #1 bilateral P/P Large Joint Inj: bilateral knee on 05/06/2017 10:56 AM Indications: pain Details: 27 G 1.5 in needle, medial approach  Arthrogram: No  Medications (Right): 20 mg Sodium Hyaluronate 20 MG/2ML; 1.5 mL lidocaine 1 % Aspirate (Right): 0 mL Medications (Left): 20 mg Sodium Hyaluronate 20 MG/2ML; 1.5 mL lidocaine 1 % Aspirate (Left): 0 mL Outcome: tolerated well, no immediate complications Procedure, treatment alternatives, risks and benefits explained, specific risks discussed. Consent was given by the patient. Immediately prior to procedure a time out was called to verify the correct patient, procedure, equipment, support staff and site/side marked as required. Patient was prepped and draped in the usual sterile fashion.     Patient tolerated the procedure well.    Hazel Sams, PA-C

## 2017-05-06 NOTE — Telephone Encounter (Signed)
Weight 137 kg 03/04/2017 CrCl 143.76

## 2017-05-13 ENCOUNTER — Ambulatory Visit: Payer: Medicare Other | Admitting: Physician Assistant

## 2017-05-13 DIAGNOSIS — M170 Bilateral primary osteoarthritis of knee: Secondary | ICD-10-CM

## 2017-05-13 DIAGNOSIS — M1712 Unilateral primary osteoarthritis, left knee: Secondary | ICD-10-CM

## 2017-05-13 DIAGNOSIS — M1711 Unilateral primary osteoarthritis, right knee: Secondary | ICD-10-CM

## 2017-05-13 MED ORDER — LIDOCAINE HCL 1 % IJ SOLN
1.5000 mL | INTRAMUSCULAR | Status: AC | PRN
  Administered 2017-05-13: 1.5 mL

## 2017-05-13 MED ORDER — SODIUM HYALURONATE (VISCOSUP) 20 MG/2ML IX SOSY
20.0000 mg | PREFILLED_SYRINGE | INTRA_ARTICULAR | Status: AC | PRN
  Administered 2017-05-13: 20 mg via INTRA_ARTICULAR

## 2017-05-13 NOTE — Progress Notes (Signed)
   Procedure Note  Patient: [name]             Date of Birth: [birth year]           MRN: [id number]             Visit Date: 05/13/2017  Procedures: Visit Diagnoses: Primary osteoarthritis of both knees - Plan: Large Joint Inj: bilateral knee Euflexxa injection #2 bilateral knee joints  Large Joint Inj: bilateral knee on 05/13/2017 1:03 PM Indications: pain Details: 27 G 1.5 in needle, medial approach  Arthrogram: No  Medications (Right): 20 mg Sodium Hyaluronate 20 MG/2ML; 1.5 mL lidocaine 1 % Aspirate (Right): 0 mL Medications (Left): 20 mg Sodium Hyaluronate 20 MG/2ML; 1.5 mL lidocaine 1 % Aspirate (Left): 0 mL Outcome: tolerated well, no immediate complications Procedure, treatment alternatives, risks and benefits explained, specific risks discussed. Consent was given by the patient. Immediately prior to procedure a time out was called to verify the correct patient, procedure, equipment, support staff and site/side marked as required. Patient was prepped and draped in the usual sterile fashion.     Patient tolerated the procedure well.    Hazel Sams, PA-C

## 2017-05-20 ENCOUNTER — Ambulatory Visit: Payer: Medicare Other | Admitting: Physician Assistant

## 2017-05-20 DIAGNOSIS — M1711 Unilateral primary osteoarthritis, right knee: Secondary | ICD-10-CM

## 2017-05-20 DIAGNOSIS — M1712 Unilateral primary osteoarthritis, left knee: Secondary | ICD-10-CM

## 2017-05-20 DIAGNOSIS — M170 Bilateral primary osteoarthritis of knee: Secondary | ICD-10-CM

## 2017-05-20 MED ORDER — LIDOCAINE HCL 1 % IJ SOLN
1.5000 mL | INTRAMUSCULAR | Status: AC | PRN
  Administered 2017-05-20: 1.5 mL

## 2017-05-20 MED ORDER — SODIUM HYALURONATE (VISCOSUP) 20 MG/2ML IX SOSY
20.0000 mg | PREFILLED_SYRINGE | INTRA_ARTICULAR | Status: AC | PRN
  Administered 2017-05-20: 20 mg via INTRA_ARTICULAR

## 2017-05-20 NOTE — Progress Notes (Signed)
   Procedure Note  Patient: [name]             Date of Birth: September 10, 1950           MRN: [id number]             Visit Date: 05/20/2017  Procedures: Visit Diagnoses: Primary osteoarthritis of both knees Euflexxa #3 Bilateral knee joints  Large Joint Inj: bilateral knee on 05/20/2017 1:21 PM Indications: pain Details: 27 G 1.5 in needle, medial approach  Arthrogram: No  Medications (Right): 1.5 mL lidocaine 1 %; 20 mg Sodium Hyaluronate 20 MG/2ML Aspirate (Right): 0 mL Medications (Left): 1.5 mL lidocaine 1 %; 20 mg Sodium Hyaluronate 20 MG/2ML Aspirate (Left): 0 mL Outcome: tolerated well, no immediate complications Procedure, treatment alternatives, risks and benefits explained, specific risks discussed. Consent was given by the patient. Immediately prior to procedure a time out was called to verify the correct patient, procedure, equipment, support staff and site/side marked as required. Patient was prepped and draped in the usual sterile fashion.     Patient tolerated the procedure well.   Hazel Sams, PA-C

## 2017-05-27 ENCOUNTER — Other Ambulatory Visit: Payer: Self-pay | Admitting: Orthopaedic Surgery

## 2017-05-27 DIAGNOSIS — M5441 Lumbago with sciatica, right side: Secondary | ICD-10-CM

## 2017-06-14 ENCOUNTER — Ambulatory Visit
Admission: RE | Admit: 2017-06-14 | Discharge: 2017-06-14 | Disposition: A | Discharge status: Home / Self Care | Payer: Medicare Other | Source: Ambulatory Visit | Attending: Orthopaedic Surgery | Admitting: Orthopaedic Surgery

## 2017-06-14 DIAGNOSIS — M5441 Lumbago with sciatica, right side: Secondary | ICD-10-CM

## 2017-06-16 ENCOUNTER — Other Ambulatory Visit: Payer: Self-pay | Admitting: Family Medicine

## 2017-06-20 NOTE — Progress Notes (Signed)
Office Visit Note  Patient: [name]             Date of Birth: Dec 11, 1950           MRN: [id number]             PCP: Shawnee Knapp, MD Referring: Shawnee Knapp, MD Visit Date: 06/30/2017 Occupation: '@GUAROCC'$ @    Subjective:  Right knee pain   History of Present Illness: [name] is a 67 y.o. female with history of osteoarthritis and DDD.  She has been having lower back pain, and continues to follow up with Dr. Rhona Raider and Dr. Benson Norway.  She had a lumbar MRI on 06/14/17 that revealed stenosis.  She reports that she is having discomfort in the left knee joint. She had Euflexxa injections on 4/26, 5/3 and 05/20/2017 of bilateral knee joints. She states she has been walking with a cane. She states she has been having hand pain, swelling, and joint stiffness in the morning.     Activities of Daily Living:  Patient reports morning stiffness for 2 hours.   Patient Reports nocturnal pain.  Difficulty dressing/grooming: Denies Difficulty climbing stairs: Reports Difficulty getting out of chair: Reports Difficulty using hands for taps, buttons, cutlery, and/or writing: Denies   Review of Systems  Constitutional: Negative for fatigue.  HENT: Negative for mouth sores, mouth dryness and nose dryness.   Eyes: Negative for pain, visual disturbance and dryness.  Respiratory: Negative for cough, hemoptysis, shortness of breath and difficulty breathing.   Cardiovascular: Negative for chest pain, palpitations, hypertension and swelling in legs/feet.  Gastrointestinal: Negative for blood in stool, constipation and diarrhea.  Endocrine: Negative for increased urination.  Genitourinary: Negative for painful urination.  Musculoskeletal: Positive for arthralgias, joint pain, joint swelling and morning stiffness. Negative for myalgias, muscle weakness, muscle tenderness and myalgias.  Skin: Negative for color change, pallor, rash, hair loss, nodules/bumps, skin tightness, ulcers and sensitivity  to sunlight.  Allergic/Immunologic: Negative for susceptible to infections.  Neurological: Negative for dizziness, numbness, headaches and weakness.  Hematological: Negative for swollen glands.  Psychiatric/Behavioral: Negative for depressed mood and sleep disturbance. The patient is not nervous/anxious.     PMFS History:  Patient Active Problem List   Diagnosis Date Noted  . Genetic testing 11/04/2016  . Family history of breast cancer   . History of breast cancer   . DJD (degenerative joint disease), cervical 07/28/2016  . DDD (degenerative disc disease), lumbar 07/28/2016  . History of DVT (deep vein thrombosis) 07/28/2016  . History of ovarian cancer 07/28/2016  . Primary osteoarthritis of both knees 04/20/2016  . Primary osteoarthritis of both hands 04/20/2016  . Primary osteoarthritis of both feet 04/20/2016  . Primary osteoarthritis of both hips 04/20/2016  . Vitamin D deficiency 04/20/2016  . Primary open angle glaucoma of both eyes, moderate stage 12/30/2015  . Hyperlipidemia 10/15/2015  . Anemia 10/15/2015  . Sleep apnea with use of continuous positive airway pressure (CPAP) 07/22/2014  . OSA on CPAP 07/04/2013  . Obesity, morbid ([place]) 07/04/2013  . Morbid obesity ([place]) 11/24/2011  . Arthritis of knee 11/24/2011  . Class 3 severe obesity due to excess calories without serious comorbidity with body mass index (BMI) of 50.0 to 59.9 in adult ([place]) 12/04/2010  . Sleep apnea 12/04/2010  . ELECTROCARDIOGRAM, ABNORMAL 06/02/2009  . Acute thromboembolism of deep veins of lower extremity ([place]) 03/26/2008  . EDEMA 03/26/2008  . ADENOCARCINOMA, OVARY 03/25/2008  . ESSENTIAL HYPERTENSION, BENIGN 03/25/2008  Past Medical History:  Diagnosis Date  . Anemia    oral iron occ.  . Arthritis   . Basal cell carcinoma of nose   . Bronchitis   . Cancer of ovary ([place]) 2005   Stage 1A, BRCA 1/2 neg 6/14  . Diverticulitis    past history  . DVT (deep vein thrombosis) in pregnancy  ([place])    LEFT LEG  . Family history of breast cancer   . Femoral DVT (deep venous thrombosis) ([place]) 2008   -"wears compression hose all times" left leg  . FH: genetic disease carrier 11/05/2016  . Genetic testing 11/04/2016   Multi-Cancer panel (83 genes) @ [facility] - see report; special interpretation  . History of ovarian cancer   . HTN (hypertension)   . Hyperlipidemia   . OSA (obstructive sleep apnea)    hypoventilation, on CPAP  . Shingles     Family History  Problem Relation Age of Onset  . Alzheimer's disease Mother   . Hypertension Mother   . Alzheimer's disease Father   . Hypertension Father   . Colon cancer Father 59       deceased 41  . Hypertension Sister   . Breast cancer Sister 37  . Hypertension Brother   . Prostate cancer Brother 71  . Prostate cancer Other        father's maternal half-brother  . Breast cancer Other 54       daughter of brother; currently 47   Past Surgical History:  Procedure Laterality Date  . BASAL CELL CARCINOMA EXCISION     resection with plastic surgery reconstrustion  . bilateral breast mass  1970   benigned, left breast  . cataract surgery Bilateral [id number], 2005   with bilateral lens replacements  . COLONOSCOPY WITH PROPOFOL N/A 05/21/2014   Procedure: COLONOSCOPY WITH PROPOFOL;  Surgeon: Juanita Craver, MD;  Location: [facility];  Service: Endoscopy;  Laterality: N/A;  . HERNIA REPAIR  3710   umbilical repair with mesh   . 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 PSH showed an AHI of 120!!!! titrated to 8 cm watr cPAP , residual AHI of 1 and downlaod was done  01-23-11 .  excellent compliance - 7 hours and low leak,  nasal pillow mask.    BMI is considered morbidly obese.  discussed low carb  diet - weight watchers and restricted exercise. , aquatic .    Patient cannot  exercise due to soreness in proximal muscles.and  would like to add coenzyme q 10 and carnitine . She needs a medical weight loss regimen - bariatric surgery?       FSS 48, Ep[place] 4 again ,  CMS compliant  residual AHI 1.1 and average use 6.48  hours , no naps    Patient is single and lives alone.   Patient does not have any children.   Patient is retired.   Patient has a Master's degree.   Patient is left-handed.   Patient drinks tea occasionally.               Objective: Vital Signs: BP 133/75 (BP Location: Left Arm, Patient Position: Sitting, Cuff Size: Large)   Pulse 76   Resp 14   Ht '5\' 7"'$  (1.702 m)   Wt (!) 302 lb (137 kg)   LMP 01/12/2003  BMI 47.30 kg/m    Physical Exam  Constitutional: She is oriented to person, place, and time. She appears well-developed and well-nourished.  HENT:  Head: Normocephalic and atraumatic.  Eyes: Conjunctivae and EOM are normal.  Neck: Normal range of motion.  Cardiovascular: Normal rate, regular rhythm, normal heart sounds and intact distal pulses.  Pulmonary/Chest: Effort normal and breath sounds normal.  Abdominal: Soft. Bowel sounds are normal.  Lymphadenopathy:    She has no cervical adenopathy.  Neurological: She is alert and oriented to person, place, and time.  Skin: Skin is warm and dry. Capillary refill takes less than 2 seconds.  Psychiatric: She has a normal mood and affect. Her behavior is normal.  Nursing note and vitals reviewed.    Musculoskeletal Exam: C-spine, thoracic , and lumbar spine good ROM.  No midline spinal tenderness.  Shoulder joints, elbow joints, wrist joints, MCPs, PIPs, and DIPs good ROM with no synovitis. PIP and DIP synovial thickening consistent with osteoarthritis.  Bilateral knee crepitus.  Left knee warmth.   Hip joints, knee joints, ankle joints, MTPs, PIPs, and DIPs good ROM with no synovitis.    CDAI Exam: No CDAI exam completed.    Investigation: No additional  findings.   Imaging: Mr Lumbar Spine Wo Contrast  Result Date: 06/15/2017 CLINICAL DATA:  67 year old female with lumbar back pain radiating to the left hip and leg. Difficulty walking. Bilateral leg weakness. Remote ovarian cancer, 2005. EXAM: MRI LUMBAR SPINE WITHOUT CONTRAST TECHNIQUE: Multiplanar, multisequence MR imaging of the lumbar spine was performed. No intravenous contrast was administered. COMPARISON:  CT Abdomen and Pelvis 12/25/2011. Lumbar MRI 04/23/2011. FINDINGS: Segmentation: Normal on the comparison CT, which was the same numbering system used on the 2013 MRI. Alignment: Stable height and alignment since 2013 with relatively preserved lumbar lordosis. Vertebrae: No marrow edema or evidence of acute osseous abnormality. Visualized bone marrow signal is within normal limits; benign L5 vertebral body hemangioma incidentally noted. Intact visible sacrum and SI joints. Conus medullaris and cauda equina: Conus extends to the L1-L2 level. No lower spinal cord or conus signal abnormality. Paraspinal and other soft tissues: Stable visible abdominal viscera. Negative visualized posterior paraspinal soft tissues. Disc levels: T11-T12: Chronic circumferential disc bulge with endplate spurring and moderate facet hypertrophy. Spinal stenosis (series 5, image 3) with no definite spinal cord mass effect. T12-L1: Mild circumferential disc bulge with mild broad-based posterior component. No stenosis. L1-L2: Chronic circumferential disc bulge with increased broad-based posterior component since 2013. Mild facet and ligament flavum hypertrophy. Borderline to mild spinal stenosis is new. No foraminal stenosis. L2-L3: Mild disc bulge. Mild facet and ligament flavum hypertrophy. Mild endplate spurring. No spinal stenosis. Mild left greater than right L2 neural foraminal stenosis is new since 2013. L3-L4: Subtle anterolisthesis. Increased circumferential but mostly far lateral disc bulging. Moderate facet and mild  to moderate ligament flavum hypertrophy. Mild spinal stenosis and bilateral foraminal stenosis is new since 2013. L4-L5: Subtle anterolisthesis. Minimal disc bulge is unchanged. Moderate facet hypertrophy is greater on the right and increased since 2013. Mild epidural lipomatosis is new. No spinal or lateral recess stenosis. New mild right L4 foraminal stenosis. L5-S1: Negative disc. Mild facet and endplate spurring has increased since 2013, but borderline to mild bilateral L5 foraminal stenosis is stable. IMPRESSION: 1. Generalized mild progression of lumbar spine degeneration since the 2013 MRI. No acute osseous abnormality. 2. Up to mild multifactorial spinal stenosis is new at L1-L2 and L3-L4, and intermittent mild lumbar neural foraminal stenosis is increased.  3. Degenerative lower thoracic spinal stenosis noted at T11-T12 and may have progressed since 2013. No associated spinal cord mass effect. Electronically Signed   By: Genevie Ann M.D.   On: 06/15/2017 08:27    Speciality Comments: No specialty comments available.    Procedures:  Large Joint Inj: L knee on 06/30/2017 2:10 PM Indications: pain Details: 27 G 1.5 in needle, medial approach  Arthrogram: No  Medications: 1.5 mL lidocaine 1 %; 40 mg triamcinolone acetonide 40 MG/ML Aspirate: 0 mL Outcome: tolerated well, no immediate complications Procedure, treatment alternatives, risks and benefits explained, specific risks discussed. Consent was given by the patient. Immediately prior to procedure a time out was called to verify the correct patient, procedure, equipment, support staff and site/side marked as required. Patient was prepped and draped in the usual sterile fashion.     Allergies: Valtrex [valacyclovir hcl]; Benoxinate base; Shingrix [zoster vac recomb adjuvanted]; Sulfonamide derivatives; and Ivp dye [iodinated diagnostic agents]   Assessment / Plan:     Visit Diagnoses: Primary osteoarthritis of both hands: She has PIP and DIP  synovial thickening consistent with osteoarthritis of bilateral hands.  She has joint stiffness first thing in the morning.  Joint protection and muscle strengthening were discussed.  Primary osteoarthritis of both hips: She is good range of motion on exam with no discomfort.  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  She has warmth but no effusion of her right knee on exam.  She has bilateral knee crepitus.  She had Euflexxa injections on 4/26, 5/3 and 05/20/2017 of bilateral knee joints.  She requested a cortisone injection of the left knee joint today.  She tolerated the procedure well.  Primary osteoarthritis of both feet: She has PIP and DIP synovial thickening consistent with osteoarthritis of bilateral feet.  She is no discomfort in her feet at this time.  She was proper fitting shoes.  DDD (degenerative disc disease), cervical: She has good range of motion on exam with no discomfort.  DDD (degenerative disc disease), lumbar: She has been having increased pain in her lower back.  She has been seeing Dr. Benson Norway.  She had a Lumbar MRI on 06/14/08 that revealed Mild multifactorial spinal stenosis L1-L2, L3-L4.  She is going to continue following up with Dr. Rhona Raider and Dr. Benson Norway.   History of vitamin D deficiency: She takes vitamin D 2000 units once daily.  Other medical conditions are listed as follows:  History of hyperlipidemia  History of hypertension  History of sleep apnea  History of anemia  History of hypercholesterolemia  History of ovarian cancer  History of breast cancer  History of DVT (deep vein thrombosis)    Orders: Orders Placed This Encounter  Procedures  . Large Joint Inj   No orders of the defined types were placed in this encounter.   Face-to-face time spent with patient was 30 minutes. >50% of time was spent in  counseling and coordination of care.  Follow-Up Instructions: Return in about 6 months (around 12/30/2017) for Osteoarthritis, DDD.   Ofilia Neas, PA-C   I examined and evaluated the patient with Hazel Sams PA.  She had warmth and swelling in her left knee joint on my examination.  I injected her left knee joint with cortisone as described above.  The plan of care was discussed as noted above.  Bo Merino, MD  Note -  This record has been created using [employer].  Chart creation errors have been sought, but may not always  have been located. Such creation errors do not reflect on  the standard of medical care.

## 2017-06-21 ENCOUNTER — Other Ambulatory Visit: Payer: Self-pay

## 2017-06-21 ENCOUNTER — Telehealth: Payer: Self-pay

## 2017-06-21 MED ORDER — FLUTICASONE PROPIONATE 50 MCG/ACT NA SUSP: NASAL | 3 refills | Status: DC

## 2017-06-21 NOTE — Telephone Encounter (Signed)
Refilled

## 2017-06-21 NOTE — Telephone Encounter (Signed)
Copied from [place] [id number]. Topic: General - Other >> Jun 17, 2017  2:16 PM Yvette Rack wrote: Reason for CRM: pt calling to see why the fluticasone (FLONASE) 50 MCG/ACT nasal spray was denied please call pt

## 2017-06-30 ENCOUNTER — Ambulatory Visit: Payer: Medicare Other | Admitting: Rheumatology

## 2017-06-30 ENCOUNTER — Encounter: Payer: Self-pay | Admitting: Rheumatology

## 2017-06-30 VITALS — BP 133/75 | HR 76 | Resp 14 | Ht 67.0 in | Wt 302.0 lb

## 2017-06-30 DIAGNOSIS — M19042 Primary osteoarthritis, left hand: Secondary | ICD-10-CM

## 2017-06-30 DIAGNOSIS — M160 Bilateral primary osteoarthritis of hip: Secondary | ICD-10-CM | POA: Diagnosis not present

## 2017-06-30 DIAGNOSIS — Z8639 Personal history of other endocrine, nutritional and metabolic disease: Secondary | ICD-10-CM

## 2017-06-30 DIAGNOSIS — M19071 Primary osteoarthritis, right ankle and foot: Secondary | ICD-10-CM

## 2017-06-30 DIAGNOSIS — Z86718 Personal history of other venous thrombosis and embolism: Secondary | ICD-10-CM

## 2017-06-30 DIAGNOSIS — Z862 Personal history of diseases of the blood and blood-forming organs and certain disorders involving the immune mechanism: Secondary | ICD-10-CM

## 2017-06-30 DIAGNOSIS — Z8679 Personal history of other diseases of the circulatory system: Secondary | ICD-10-CM

## 2017-06-30 DIAGNOSIS — Z8543 Personal history of malignant neoplasm of ovary: Secondary | ICD-10-CM

## 2017-06-30 DIAGNOSIS — G8929 Other chronic pain: Secondary | ICD-10-CM | POA: Diagnosis not present

## 2017-06-30 DIAGNOSIS — Z8669 Personal history of other diseases of the nervous system and sense organs: Secondary | ICD-10-CM

## 2017-06-30 DIAGNOSIS — M25562 Pain in left knee: Secondary | ICD-10-CM

## 2017-06-30 DIAGNOSIS — M170 Bilateral primary osteoarthritis of knee: Secondary | ICD-10-CM

## 2017-06-30 DIAGNOSIS — M5030 Other cervical disc degeneration, unspecified cervical region: Secondary | ICD-10-CM | POA: Diagnosis not present

## 2017-06-30 DIAGNOSIS — M5136 Other intervertebral disc degeneration, lumbar region: Secondary | ICD-10-CM

## 2017-06-30 DIAGNOSIS — Z853 Personal history of malignant neoplasm of breast: Secondary | ICD-10-CM | POA: Diagnosis not present

## 2017-06-30 DIAGNOSIS — M19041 Primary osteoarthritis, right hand: Secondary | ICD-10-CM

## 2017-06-30 DIAGNOSIS — M19072 Primary osteoarthritis, left ankle and foot: Secondary | ICD-10-CM

## 2017-06-30 MED ORDER — LIDOCAINE HCL 1 % IJ SOLN
1.5000 mL | INTRAMUSCULAR | Status: AC | PRN
  Administered 2017-06-30: 1.5 mL

## 2017-06-30 MED ORDER — TRIAMCINOLONE ACETONIDE 40 MG/ML IJ SUSP
40.0000 mg | INTRAMUSCULAR | Status: AC | PRN
  Administered 2017-06-30: 40 mg via INTRA_ARTICULAR

## 2017-07-27 ENCOUNTER — Encounter: Payer: Self-pay | Admitting: Neurology

## 2017-07-28 ENCOUNTER — Encounter: Payer: Self-pay | Admitting: Neurology

## 2017-07-28 ENCOUNTER — Ambulatory Visit: Payer: Medicare Other | Admitting: Neurology

## 2017-07-28 VITALS — BP 133/72 | HR 73 | Ht 67.0 in | Wt 297.0 lb

## 2017-07-28 DIAGNOSIS — Z9989 Dependence on other enabling machines and devices: Secondary | ICD-10-CM | POA: Diagnosis not present

## 2017-07-28 DIAGNOSIS — G4733 Obstructive sleep apnea (adult) (pediatric): Secondary | ICD-10-CM | POA: Insufficient documentation

## 2017-07-28 DIAGNOSIS — E662 Morbid (severe) obesity with alveolar hypoventilation: Secondary | ICD-10-CM | POA: Insufficient documentation

## 2017-07-28 NOTE — Progress Notes (Signed)
[facility]  Provider:  Dr Rayanna Matusik Referring Provider: Shawnee Knapp, MD Primary Care Physician:  Shawnee Knapp, MD    HPI:  [name] is a 67 y.o. female  here for CPAP compliance,   07/28/17 , I have the pleasure of meeting today with [name]  a 67 year old [name] right-handed female who has been followed for severe sleep apnea.  Over the last years the patient has always been 100% compliance as she is today.  She has used the machine every day on average 6 hours 58 minutes, the CPAP machine is set at 8 cmH2O with a full-time expiratory pressure relief of 2 cmH2O the residual AHI is 0.5 apneas per hour.  This is an excellent resolution, she does have mild air leakage, she does not have central apneas emerging under treatment. FSS was 15 points, Epworth  3 points GDS 0/15 points. The patient likes her CPAP and feels more energized since using it, she has ben able to lose weight - portion control and aqua gym exercises. She had PT directed exercises.     Routine yearly revisit and this [place] presents again as 100% compliant CPAP user with a rate of 7 hours and 21 minutes. The use of CPAP pressure of 8 cm water is 2 cm in ER, and the total AHI of 0.8. No changes have to be made to have significant air leaks and a very happy with her usage. She endorsed the Epworth sleepiness score at 4 points and the fatigue severity score at 10 points all indicated that CPAP therapy is effective. [name] also wanted to inquire about her travel friendly her CPAP. Her machine was prescribed at the year 2013 nor 2014, and at this time is still working. She is interested in a trouble friendly a light weight machine. This should be her secondary machine however she couldn't keep her current one at home and use a trouble machine for all other purposes. I did show her the model by [employer] and there is a very nice model by [employer]	[place] as well.   Review of Systems:Out of a  complete 14 system review, the patient complains of only the following symptoms, and all other reviewed systems are negative.   2014 . [name] ,a 67 year old, [name], right-handed female patient of Dr.Daub's is followed here for a sleep apnea.  The patient underwent a split study on 9-21 2012, after complaining of excessive daytime fatigue, sleepiness, witnessed apneas and fragmented sleep at night she also was easily short of breath. The patient at the past medical history of morbid obesity, hypertension and hyperlipidemia. At the time of her sleep study her BMI was 46.7 and neck circumference 17 inches her blood pressure was also slightly elevated 141/81 mm Hg. The patient had been on Coumadin for DVT at the time. She tested positive for an AHI of 120 and had to be immediately titrated to a pressure of 9 cm water was reduced for AHI to 0.0 he also was able to enter REM sleep. There was borderline hypoxia noted with hypoventilation and periodic limb movements were also very frequent at night. In February by 2013 she followed up  90 days download- and was using CPAP nightly , had not gained weight and a download revealed a residual AHI on CPAP of 8 cm water of 1.6 . She has meanwhile tried to further was weight especially since he also has had pain and pain due to arthritis. A download from 4-17 -  2013 showed an AHI of 1.1, average daily usage of 6 hours 56 minutes set pressure of 8 cm water with a 2 cm EPR. Today her Epworth sleepiness score is 4 points her fatigue score 25 and the patient has continued to use compliantly her CPAP. We were unable to obtain a download in the office today. The patient reports her normal sleep time is from 12:30 midnight to 7:30 AM, she normally falls asleep within 5 minutes. She has no longer nocturia but had nocturia before starting CPAP. She averages about 7 hours of sleep at night. When she wakes up she feels restored, she still takes one nap daily but after lunch about  the duration of 30-45 minutes. She reports laughingly that her whole family naps !The nap is perceived as refreshing. Does not recall any dreams or nightmares or any dreams activity sleepwalking or sleep talking. She does not snore through the machine. She retired since last visit and is free to nap now. She is losing weight, about 6 pounds a month.    Interval history from 07/23/2015. [name] is here today for routine follow-up she reports that over the last 1 or 2 months she has been dreaming more some of his dreams of visit and have a more threatening content but they're not strictly nightmares. Usually she is running from something, fleeing or defending herself. She has no indication that she acted out on dreams. She had originally undergone a sleep study on September 2012 and was placed on CPAP 9 cm water. She had an AHI at that time of 120. All last summer she has lost weight when she entered the aquatic center. She continues to do this 3 times a week. I am waiting for an in office download.  Patient 100% compliance for days of use and for 4 hours or more of nightly use, with an average of 7 hours and 50 minutes. CPAP is set at 8 cm water with 27 m EPR residual AHI is 0.7. This is an excellent result she also doesn't have major air leaks. No adjustments to CPAP necessary- I will see her in 32 month     Social History   Socioeconomic History  . Marital status: Single    Spouse name: Not on file  . Number of children: 0  . Years of education: masters  . Highest education level: Master's degree (e.g., MA, MS, MEng, MEd, MSW, MBA)  Occupational History  . 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 Financial resource strain: Not hard at all  . Food insecurity:    Worry: Never true    Inability: Never true  . Transportation needs:    Medical: No    Non-medical: No  Tobacco Use  . Smoking status: Never Smoker  .  Smokeless tobacco: Never Used  . Tobacco comment: only for 6 months in the 70s  Substance and Sexual Activity  . Alcohol use: Yes    Alcohol/week: 0.0 oz    Comment: rare, 2 yearly  . Drug use: Never  . Sexual activity: Never    Partners: Female    Birth control/protection: Surgical  Lifestyle  . Physical activity:    Days per week: 3 days    Minutes per session: 60 min  . Stress: Not at all  Relationships  . Social connections:    Talks on phone: More than three times a week  Gets together: More than three times a week    Attends religious service: Never    Active member of club or organization: Yes    Attends meetings of clubs or organizations: 1 to 4 times per year    Relationship status: Never married  . Intimate partner violence:    Fear of current or ex partner: No    Emotionally abused: No    Physically abused: No    Forced sexual activity: No  Other Topics Concern  . 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 PSH showed an AHI of 120!!!! titrated to 8 cm watr cPAP , residual AHI of 1 and downlaod was done  01-23-11 .  excellent compliance - 7 hours and low leak,  nasal pillow mask.    BMI is considered morbidly obese.  discussed low carb  diet - weight watchers and restricted exercise. , aquatic .    Patient cannot exercise due to soreness in proximal muscles.and  would like to add coenzyme q 10 and carnitine . She needs a medical weight loss regimen - bariatric surgery?       FSS 48, Epworth 4 again ,  CMS compliant  residual AHI 1.1 and average use 6.48  hours , no naps    Patient is single and lives alone.   Patient does not have any children.   Patient is retired.   Patient has a Master's degree.   Patient is left-handed.   Patient drinks tea occasionally.              Family History  Problem Relation Age of Onset  . Alzheimer's disease Mother   . Hypertension Mother   . Alzheimer's disease Father    . Hypertension Father   . Colon cancer Father 31       deceased 33  . Hypertension Sister   . Breast cancer Sister 58  . Hypertension Brother   . Prostate cancer Brother 11  . Prostate cancer Other        father's maternal half-brother  . Breast cancer Other 96       daughter of brother; currently 16    Past Medical History:  Diagnosis Date  . Anemia    oral iron occ.  . Arthritis   . Basal cell carcinoma of nose   . Bronchitis   . Cancer of ovary ([place]) 2005   Stage 1A, BRCA 1/2 neg 6/14  . Diverticulitis    past history  . DVT (deep vein thrombosis) in pregnancy ([place])    LEFT LEG  . Family history of breast cancer   . 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 FH: genetic disease carrier 11/05/2016  . Genetic testing 11/04/2016   Multi-Cancer panel (83 genes) @ [facility] - see report; special interpretation  . History of ovarian cancer   . HTN (hypertension)   . Hyperlipidemia   . OSA (obstructive sleep apnea)    hypoventilation, on CPAP  . Shingles     Past Surgical History:  Procedure Laterality Date  . BASAL CELL CARCINOMA EXCISION     resection with plastic surgery reconstrustion  . bilateral breast mass  1970   benigned, left breast  . cataract surgery Bilateral 2703,5009, 2005   with bilateral lens replacements  . COLONOSCOPY WITH PROPOFOL N/A 05/21/2014   Procedure: COLONOSCOPY WITH PROPOFOL;  Surgeon: Mar Daring  Collene Mares, MD;  Location: [facility];  Service: Endoscopy;  Laterality: N/A;  . HERNIA REPAIR  3009   umbilical repair with mesh   . TOTAL ABDOMINAL HYSTERECTOMY W/ BILATERAL SALPINGOOPHORECTOMY  2005   Stage IA ovarian cancer    Current Outpatient Medications  Medication Sig Dispense Refill  . ACETAMINOPHEN PO Take 650 mg by mouth as needed.     Marland Kitchen atorvastatin (LIPITOR) 40 MG tablet Take 1 tablet (40 mg total) daily by mouth. 90 tablet 3  . Calcium Carb-Cholecalciferol (CALCIUM 600 + D PO)  Take 1 tablet daily by mouth.    . Cholecalciferol (VITAMIN D) 2000 units CAPS Take by mouth daily.    . EUFLEXXA 20 MG/2ML SOSY     . fish oil-omega-3 fatty acids 1000 MG capsule Take 1-2 g by mouth 2 (two) times daily.     . fluticasone (FLONASE) 50 MCG/ACT nasal spray USE 2 SPRAYS IN EACH NOSTRIL AT BEDTIME 16 g 3  . Multiple Vitamin (MULTIVITAMIN) tablet Take 1 tablet by mouth every morning.     . niacin 500 MG tablet Take 500 mg by mouth every evening.     [name] 20 MG TABS tablet TAKE 1 TABLET (20 MG TOTAL) BY MOUTH DAILY. 90 tablet 1  . furosemide (LASIX) 20 MG tablet Take 1 tablet (20 mg total) by mouth daily. 90 tablet 3  . lisinopril (PRINIVIL,ZESTRIL) 40 MG tablet Take 1 tablet (40 mg total) by mouth daily. 90 tablet 3   No current facility-administered medications for this visit.     Allergies as of 07/28/2017 - Review Complete 07/28/2017  Allergen Reaction Noted  . Valtrex [valacyclovir hcl] Swelling and Rash 10/20/2011  . Benoxinate base  12/04/2010  . Shingrix [zoster vac recomb adjuvanted]  06/30/2017  . Sulfonamide derivatives  07/18/2008  . Ivp dye [iodinated diagnostic agents] Rash 12/04/2010    Vitals: BP 133/72   Pulse 73   Ht '5\' 7"'  (1.702 m)   Wt 297 lb (134.7 kg)   LMP 01/12/2003   BMI 46.52 kg/m  Last Weight:  Wt Readings from Last 1 Encounters:  07/28/17 297 lb (134.7 kg)   Last Height:   Ht Readings from Last 1 Encounters:  07/28/17 '5\' 7"'  (1.702 m)     Physical exam:  General: The patient is awake, alert and appears not in acute distress. The patient is well groomed. Head: Normocephalic, atraumatic. Neck is supple. Mallampati 4 ,  neck circumference: 18 inches  - no retrognathia.  No TMJ .Short, thick neck  Cardiovascular:  Regular rate and rhythm, without  murmurs or carotid bruit, and without distended neck veins.  Lungs are clear to auscultation. Patient has restricted deep breathing.  Skin: ankle  Edema, this patient has unusual finger  nail deformities- spooned , dimpled.  Trunk: BMI is morbidly obese, but she has a normal posture.  Neurologic exam : The patient is awake and alert, oriented to place and time.  Memory subjective  described as intact. There is a normal attention span & concentration ability. Speech is fluent without dysarthria, dysphonia or aphasia. Mood and affect are appropriate.  Cranial nerves: Pupils are equal and briskly reactive to light. Hearing to finger rub intact. Facial sensation intact to fine touch. Facial motor strength is symmetric and tongue and uvula move midline. Gait and station: Patient walks with a cane as assistive device. Gait is wide based.  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 Severe AHI of 120 in the patient baseline study completely corrected of only 8 cm of water pressure and no additional oxygen was prescribed the residual AHI was 0.5  Travel CPAP was not covered by insurance- she is interested in an adapter to a car battery for times of power loss.  2 )Very high body mass index. Is encouraged to hydrate well, and to keep her vitamin K intake in mind, as she continues to be on warfarin. She is now swimming more regularly lost weight by portion control.     Larey Seat, MD   Cc; Dr. Delman Cheadle

## 2017-08-11 ENCOUNTER — Telehealth: Payer: Self-pay | Admitting: Family Medicine

## 2017-08-11 NOTE — Telephone Encounter (Signed)
Copied from [place] [id number]. Topic: General - Other >> Aug 11, 2017  2:03 PM Keene Breath wrote: Reason for CRM: Patient called to verify if she should get the 2nd shingles shot since she had a reaction to the 1st shot in the series.  She had developed a pimple on her face that eventually went away after about 5 weeks.  Patient is unsure if she should have the second shot which is due this month.  Please advise.  CB#[phone].

## 2017-08-11 NOTE — Telephone Encounter (Signed)
pls advise

## 2017-08-27 NOTE — Telephone Encounter (Signed)
Pt called again to check on the status of her message.  Since it has been a while since the message was put in, I asked [name] if she would be okay to have her second shingles shot.  She states that it should be fine to get the next shot.  If she develops hives or SOB, then she must be seen for that reaction.

## 2017-09-01 LAB — HM MAMMOGRAPHY

## 2017-09-01 LAB — HM DEXA SCAN

## 2017-09-09 ENCOUNTER — Telehealth: Payer: Self-pay | Admitting: *Deleted

## 2017-09-09 NOTE — Telephone Encounter (Signed)
Patient notified of BMD results patient asks if degenerative changes in hip are the reason for mobility issues? Patient is having a lot of pain in hip and left leg and thinks may be due to arthritis.  Please advise.

## 2017-09-09 NOTE — Telephone Encounter (Signed)
Yes, degenerative changes absolutely can cause pain and mobility issues.  Maybe should consider seeing ortho.  Does she want me to refer her?

## 2017-09-09 NOTE — Telephone Encounter (Signed)
Report scanned 

## 2017-09-13 NOTE — Telephone Encounter (Signed)
Called patient. States she is seeing an ortho doctor already. Requested copy of Bone density sent to him. Dr. Melrose Nakayama. Report routed today.   Dr. Lestine Box  Encounter closed.

## 2017-09-14 ENCOUNTER — Encounter: Payer: Self-pay | Admitting: Obstetrics & Gynecology

## 2017-09-17 ENCOUNTER — Encounter: Payer: Self-pay | Admitting: *Deleted

## 2017-09-17 ENCOUNTER — Other Ambulatory Visit: Payer: Self-pay | Admitting: Family Medicine

## 2017-09-19 NOTE — Telephone Encounter (Signed)
Spoke with [facility] [phone] is requesting a 90 day supply of the fluticasone 50 MCG/ACT nasal spray. She only has a partial of a refill at the pharmacy, so pharmacy is requesting a new prescription.   LOV  03/04/17 LR  06/21/17 with 3 refills PCP  E. Brigitte Pulse [facility]  11/21/17

## 2017-09-21 NOTE — Telephone Encounter (Signed)
Returned pt's phone call. She has enough Flonase until October. Will check with the pharmacy at the next time for refill.

## 2017-09-27 ENCOUNTER — Telehealth: Payer: Self-pay | Admitting: Rheumatology

## 2017-09-27 ENCOUNTER — Encounter: Payer: Self-pay | Admitting: Physician Assistant

## 2017-09-27 ENCOUNTER — Ambulatory Visit: Payer: Medicare Other | Admitting: Physician Assistant

## 2017-09-27 VITALS — BP 146/88 | HR 88 | Ht 67.0 in | Wt 288.0 lb

## 2017-09-27 DIAGNOSIS — M19041 Primary osteoarthritis, right hand: Secondary | ICD-10-CM | POA: Diagnosis not present

## 2017-09-27 DIAGNOSIS — M25561 Pain in right knee: Secondary | ICD-10-CM

## 2017-09-27 DIAGNOSIS — M5136 Other intervertebral disc degeneration, lumbar region: Secondary | ICD-10-CM

## 2017-09-27 DIAGNOSIS — M8589 Other specified disorders of bone density and structure, multiple sites: Secondary | ICD-10-CM

## 2017-09-27 DIAGNOSIS — M19072 Primary osteoarthritis, left ankle and foot: Secondary | ICD-10-CM

## 2017-09-27 DIAGNOSIS — Z862 Personal history of diseases of the blood and blood-forming organs and certain disorders involving the immune mechanism: Secondary | ICD-10-CM

## 2017-09-27 DIAGNOSIS — Z8669 Personal history of other diseases of the nervous system and sense organs: Secondary | ICD-10-CM

## 2017-09-27 DIAGNOSIS — G8929 Other chronic pain: Secondary | ICD-10-CM | POA: Diagnosis not present

## 2017-09-27 DIAGNOSIS — Z8679 Personal history of other diseases of the circulatory system: Secondary | ICD-10-CM

## 2017-09-27 DIAGNOSIS — Z8543 Personal history of malignant neoplasm of ovary: Secondary | ICD-10-CM

## 2017-09-27 DIAGNOSIS — Z8639 Personal history of other endocrine, nutritional and metabolic disease: Secondary | ICD-10-CM

## 2017-09-27 DIAGNOSIS — M160 Bilateral primary osteoarthritis of hip: Secondary | ICD-10-CM | POA: Diagnosis not present

## 2017-09-27 DIAGNOSIS — Z86718 Personal history of other venous thrombosis and embolism: Secondary | ICD-10-CM

## 2017-09-27 DIAGNOSIS — M170 Bilateral primary osteoarthritis of knee: Secondary | ICD-10-CM

## 2017-09-27 DIAGNOSIS — Z853 Personal history of malignant neoplasm of breast: Secondary | ICD-10-CM

## 2017-09-27 DIAGNOSIS — M19042 Primary osteoarthritis, left hand: Secondary | ICD-10-CM

## 2017-09-27 DIAGNOSIS — M19071 Primary osteoarthritis, right ankle and foot: Secondary | ICD-10-CM

## 2017-09-27 DIAGNOSIS — M5030 Other cervical disc degeneration, unspecified cervical region: Secondary | ICD-10-CM

## 2017-09-27 MED ORDER — LIDOCAINE HCL 1 % IJ SOLN
1.5000 mL | INTRAMUSCULAR | Status: AC | PRN
  Administered 2017-09-27: 1.5 mL

## 2017-09-27 MED ORDER — TRIAMCINOLONE ACETONIDE 40 MG/ML IJ SUSP
40.0000 mg | INTRAMUSCULAR | Status: AC | PRN
  Administered 2017-09-27: 40 mg via INTRA_ARTICULAR

## 2017-09-27 NOTE — Telephone Encounter (Signed)
Patient called stating she had her last Euflexxa injection on 05/20/17 and would like to order more.  Patient states she is not sure if she can wait and is requesting a return call to see if she can get a cortisone injection in her right knee while she waits for her insurance company to approve the gel injections.

## 2017-09-27 NOTE — Telephone Encounter (Signed)
Patient scheduled an appointment for 09/27/17 at 2 pm for right knee pain.

## 2017-09-27 NOTE — Progress Notes (Signed)
Office Visit Note  Patient: [name]             Date of Birth: April 01, 1950           MRN: [id number]             PCP: Shawnee Knapp, MD Referring: Shawnee Knapp, MD Visit Date: 09/27/2017 Occupation: _0 @  Subjective:  Right knee pain   History of Present Illness: [name] is a 66 y.o. female with history of osteoarthritis and DDD.  Patient presents today with right knee pain.  She states the pain is been very severe at night.  She states that she has been having to walk with a cane due to the discomfort.  She denies any joint pain or joint swelling.  She states she continues to have some warmth of her left knee but no discomfort at this time.  She states that the cortisone injection at her last visit helped considerably.  She states that she has been having increased left hip pain.  She states that her daughter performed a trochanter bursitis injection which did not provide any pain relief.  She states that she followed up with Dr. Mina Marble who performed an intra-articular left hip injection which has provided some relief.  She continues to have groin pain bilaterally.  She states that she has been performing stretching exercises in the pool which have been helping.  She continues to have chronic lower back pain.  She states that she notices some swelling in her hands but denies any discomfort at this time.  She states that she had a DEXA scan performed on 09/01/2017 which revealed osteopenia.  She has been taking calcium and vitamin D.    Activities of Daily Living:  Patient reports morning stiffness for 30-60 minutes.   Patient Reports nocturnal pain.  Difficulty dressing/grooming: Denies Difficulty climbing stairs: Reports Difficulty getting out of chair: Denies Difficulty using hands for taps, buttons, cutlery, and/or writing: Denies  Review of Systems  Constitutional: Negative for fatigue.  HENT: Negative for mouth sores, mouth dryness and nose dryness.   Eyes:  Negative for pain, visual disturbance and dryness.  Respiratory: Negative for cough, hemoptysis, shortness of breath and difficulty breathing.   Cardiovascular: Negative for chest pain, palpitations, hypertension and swelling in legs/feet.  Gastrointestinal: Negative for blood in stool, constipation and diarrhea.  Endocrine: Negative for increased urination.  Genitourinary: Negative for difficulty urinating and painful urination.  Musculoskeletal: Positive for arthralgias, joint pain, myalgias, muscle weakness, morning stiffness and myalgias. Negative for joint swelling and muscle tenderness.  Skin: Negative for color change, pallor, rash, hair loss, nodules/bumps, skin tightness, ulcers and sensitivity to sunlight.  Allergic/Immunologic: Negative for susceptible to infections.  Neurological: Negative for dizziness, numbness and headaches.  Hematological: Negative for swollen glands.  Psychiatric/Behavioral: Positive for sleep disturbance. Negative for depressed mood. The patient is not nervous/anxious.     PMFS History:  Patient Active Problem List   Diagnosis Date Noted  . Morbid obesity with alveolar hypoventilation ([place]) 07/28/2017  . Severe obstructive sleep apnea 07/28/2017  . Genetic testing 11/04/2016  . Family history of breast cancer   . History of breast cancer   . DJD (degenerative joint disease), cervical 07/28/2016  . DDD (degenerative disc disease), lumbar 07/28/2016  . History of DVT (deep vein thrombosis) 07/28/2016  . History of ovarian cancer 07/28/2016  . Primary osteoarthritis of both knees 04/20/2016  . Primary osteoarthritis of both hands 04/20/2016  . Primary osteoarthritis  of both feet 04/20/2016  . Primary osteoarthritis of both hips 04/20/2016  . Vitamin D deficiency 04/20/2016  . Primary open angle glaucoma of both eyes, moderate stage 12/30/2015  . Hyperlipidemia 10/15/2015  . Anemia 10/15/2015  . Sleep apnea with use of continuous positive airway  pressure (CPAP) 07/22/2014  . OSA on CPAP 07/04/2013  . Obesity, morbid ([place]) 07/04/2013  . Morbid obesity ([place]) 11/24/2011  . Arthritis of knee 11/24/2011  . Class 3 severe obesity due to excess calories without serious comorbidity with body mass index (BMI) of 50.0 to 59.9 in adult ([place]) 12/04/2010  . Sleep apnea 12/04/2010  . ELECTROCARDIOGRAM, ABNORMAL 06/02/2009  . Acute thromboembolism of deep veins of lower extremity ([place]) 03/26/2008  . EDEMA 03/26/2008  . ADENOCARCINOMA, OVARY 03/25/2008  . ESSENTIAL HYPERTENSION, BENIGN 03/25/2008    Past Medical History:  Diagnosis Date  . Anemia    oral iron occ.  . Arthritis   . Basal cell carcinoma of nose   . Bronchitis   . Cancer of ovary ([place]) 2005   Stage 1A, BRCA 1/2 neg 6/14  . Degenerative joint disease of both hips 2019  . Diverticulitis    past history  . DVT (deep vein thrombosis) in pregnancy ([place])    LEFT LEG  . Family history of breast cancer   . Femoral DVT (deep venous thrombosis) ([place]) 2008   -"wears compression hose all times" left leg  . FH: genetic disease carrier 11/05/2016  . Genetic testing 11/04/2016   Multi-Cancer panel (83 genes) @ [facility] - see report; special interpretation  . History of ovarian cancer   . HTN (hypertension)   . Hyperlipidemia   . OSA (obstructive sleep apnea)    hypoventilation, on CPAP  . Osteopenia 2019  . Shingles     Family History  Problem Relation Age of Onset  . Alzheimer's disease Mother   . Hypertension Mother   . Alzheimer's disease Father   . Hypertension Father   . Colon cancer Father 68       deceased 73  . Hypertension Sister   . Breast cancer Sister 21  . Hypertension Brother   . Prostate cancer Brother 65  . Prostate cancer Other        father's maternal half-brother  . Breast cancer Other 58       daughter of brother; currently 29   Past Surgical History:  Procedure Laterality Date  . BASAL CELL CARCINOMA EXCISION     resection with plastic surgery  reconstrustion  . bilateral breast mass  1970   benigned, left breast  . cataract surgery Bilateral [id number], 2005   with bilateral lens replacements  . COLONOSCOPY WITH PROPOFOL N/A 05/21/2014   Procedure: COLONOSCOPY WITH PROPOFOL;  Surgeon: Juanita Craver, MD;  Location: [facility];  Service: Endoscopy;  Laterality: N/A;  . HERNIA REPAIR  6283   umbilical repair with mesh   . 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 PSH showed an AHI of 120!!!! titrated to 8 cm watr cPAP , residual AHI of 1 and downlaod was done  01-23-11 .  excellent compliance - 7 hours and low leak,  nasal pillow mask.    BMI is considered morbidly obese.  discussed low carb  diet - weight watchers and restricted exercise. , aquatic .    Patient  cannot exercise due to soreness in proximal muscles.and  would like to add coenzyme q 10 and carnitine . She needs a medical weight loss regimen - bariatric surgery?       FSS 48, Epworth 4 again ,  CMS compliant  residual AHI 1.1 and average use 6.48  hours , no naps    Patient is single and lives alone.   Patient does not have any children.   Patient is retired.   Patient has a Master's degree.   Patient is left-handed.   Patient drinks tea occasionally.              Objective: Vital Signs: BP (!) 146/88 (BP Location: Left Arm, Patient Position: Sitting, Cuff Size: Normal)   Pulse 88   Ht 5' 7" (1.702 m)   Wt 288 lb (130.6 kg)   LMP 01/12/2003   BMI 45.11 kg/m    Physical Exam  Constitutional: She is oriented to person, place, and time. She appears well-developed and well-nourished.  HENT:  Head: Normocephalic and atraumatic.  Eyes: Conjunctivae and EOM are normal.  Neck: Normal range of motion.  Cardiovascular: Normal rate, regular rhythm, normal heart sounds and intact distal pulses.    Pulmonary/Chest: Effort normal and breath sounds normal.  Abdominal: Soft. Bowel sounds are normal.  Lymphadenopathy:    She has no cervical adenopathy.  Neurological: She is alert and oriented to person, place, and time.  Skin: Skin is warm and dry. Capillary refill takes less than 2 seconds.  Psychiatric: She has a normal mood and affect. Her behavior is normal.  Nursing note and vitals reviewed.    Musculoskeletal Exam: C-spine good range of motion.  Thoracic and lumbar spine limited range of motion.  She has no midline spinal tenderness.  No SI joint tenderness.  Shoulder joints, elbow joints, wrist joints, MCPs, PIPs, DIPs good range of motion no synovitis.  She is complete fist formation bilaterally.  She has limited range of motion bilateral hip joints.  She has good range of motion in both knee joints with discomfort.  Left knee joint warmth was noted.  No warmth or effusion of right knee joint.  She is wearing compression stockings and has pedal edema bilaterally.  No tenderness of ankle joints bilaterally.  CDAI Exam: CDAI Score: Not documented Patient Global Assessment: Not documented; Provider Global Assessment: Not documented Swollen: Not documented; Tender: Not documented Joint Exam   Not documented   There is currently no information documented on the homunculus. Go to the Rheumatology activity and complete the homunculus joint exam.  Investigation: No additional findings.  Imaging: No results found.  Recent Labs: Lab Results  Component Value Date   WBC 3.9 11/18/2016   HGB 13.3 11/18/2016   PLT 196 11/18/2016   NA 142 11/18/2016   K 4.1 11/18/2016   CL 103 11/18/2016   CO2 25 11/18/2016   GLUCOSE 93 11/18/2016   BUN 20 11/18/2016   CREATININE 0.81 11/18/2016   BILITOT 0.7 11/18/2016   ALKPHOS 91 11/18/2016   AST 25 11/18/2016   ALT 23 11/18/2016   PROT 7.0 11/18/2016   ALBUMIN 4.6 11/18/2016   CALCIUM 10.0 11/18/2016   GFRAA 86 11/18/2016     Speciality Comments: No specialty comments available.  Procedures:  Large Joint Inj: R knee on 09/27/2017 2:58 PM Indications: pain Details: 27 G 1.5 in needle, medial approach  Arthrogram: No  Medications: 1.5 mL lidocaine 1 %; 40 mg triamcinolone acetonide 40 MG/ML Aspirate: 0 mL  Outcome: tolerated well, no immediate complications Procedure, treatment alternatives, risks and benefits explained, specific risks discussed. Consent was given by the patient. Immediately prior to procedure a time out was called to verify the correct patient, procedure, equipment, support staff and site/side marked as required. Patient was prepped and draped in the usual sterile fashion.     Allergies: Valtrex [valacyclovir hcl]; Benoxinate base; Shingrix [zoster vac recomb adjuvanted]; Sulfonamide derivatives; and Ivp dye [iodinated diagnostic agents]   Assessment / Plan:     Visit Diagnoses: Primary osteoarthritis of both hands: She has PIP and DIP synovial thickening consistent with osteoarthritis of bilateral hands.  She has bilateral CMC joint synovial thickening.  Joint protection and muscle strengthening were discussed.  Primary osteoarthritis of both hips: She has limited range of motion with discomfort in both hip joints.  She is had a intra-articular left hip injection performed by Dr. Mina Marble several weeks ago.  Primary osteoarthritis of both knees - Euflexxa injections on 4/26, 5/3 and 05/20/2017 of bilateral knee joints. She has a left knee cortisone injection on 06/30/17.  She has left knee warmth on exam.  She has no discomfort in her left knee at this time.  She is having increased discomfort in her right knee.  She requested a cortisone injection today in the office.  She tolerated procedure well.  She was encouraged to continue to exercise in the pool on a regular basis.  She was advised to not go to the pool tomorrow though since she had a cortisone injection today.  Chronic pain of right knee:  No warmth or effusion noted.  She has been having increased discomfort in her right knee joint recently.  She has been having to walk with a cane due to the discomfort.  The pain is also been waking her up at night.  She requested a cortisone injection today in the office.  She tolerated procedure well.  Potential side effects were discussed.  She was advised to monitor blood pressure closely upon the cortisone injection today.  Primary osteoarthritis of both feet: She has osteoarthritic changes in bilateral feet.  She has no discomfort in her feet at this time.  DDD (degenerative disc disease), cervical: She has good range of motion with no discomfort at this time.  She has no symptoms of radiculopathy.  DDD (degenerative disc disease), lumbar - Dr. Benson Norway.  Lumbar MRI on 06/14/08 revealed Mild multifactorial spinal stenosis L1-L2, L3-L4.  She is going to continue following up with Dr. Rhona Raider and Mina Marble.  Osteopenia of multiple sites: Patient had a DEXA scan on 09/01/2017 that revealed a T score of -1.9.  She has started taking vitamin D on a daily basis as well as calcium.  We discussed the importance of resistive exercises.  She works out in the pool 3 times a week.  We discussed using weights in the pool as well.  History of vitamin D deficiency: She takes a calcium and vitamin D supplement on a daily basis.  Other medical conditions are listed as follows:  History of hyperlipidemia  History of hypertension  History of sleep apnea  History of anemia  History of ovarian cancer  History of breast cancer  History of DVT (deep vein thrombosis)   Orders: Orders Placed This Encounter  Procedures  . Large Joint Inj   No orders of the defined types were placed in this encounter.   Face-to-face time spent with patient was 30 minutes. Greater than 50% of time was spent in counseling  and coordination of care.  Follow-Up Instructions: Return in about 6 months (around 03/28/2018) for  Osteoarthritis, DDD.   Ofilia Neas, PA-C  Note - This record has been created using Dragon software.  Chart creation errors have been sought, but may not always  have been located. Such creation errors do not reflect on  the standard of medical care.

## 2017-09-29 ENCOUNTER — Telehealth: Payer: Self-pay | Admitting: *Deleted

## 2017-09-29 NOTE — Telephone Encounter (Signed)
Pt [employer] form on [employer],

## 2017-09-29 NOTE — Telephone Encounter (Signed)
I faxed pt [employer] form to [phone]

## 2017-09-29 NOTE — Telephone Encounter (Signed)
Form completed and handed back to Hilda Blades in medical records.

## 2017-09-30 ENCOUNTER — Telehealth: Payer: Self-pay | Admitting: Rheumatology

## 2017-09-30 NOTE — Telephone Encounter (Signed)
Patient left a voicemail checking the status of her Euflexxa injections.    

## 2017-09-30 NOTE — Telephone Encounter (Signed)
Patient would like to have Euflexxa injections. Okay to apply for Eufleexa bilateral knees in November?

## 2017-09-30 NOTE — Telephone Encounter (Signed)
Ok to apply for Euflexxa for bilateral knee joints.

## 2017-09-30 NOTE — Telephone Encounter (Signed)
Please apply for Euflexxa bilateral knees. Thanks!

## 2017-09-30 NOTE — Telephone Encounter (Signed)
Patient advised the information has been sent to April to apply. Patient can have her injections in November 2019.

## 2017-10-03 ENCOUNTER — Telehealth (INDEPENDENT_AMBULATORY_CARE_PROVIDER_SITE_OTHER): Payer: Self-pay

## 2017-10-03 NOTE — Telephone Encounter (Signed)
Noted  

## 2017-10-03 NOTE — Telephone Encounter (Signed)
Submitted VOB for Eufleexa series, bilateral knee.

## 2017-10-04 ENCOUNTER — Encounter: Payer: Self-pay | Admitting: Family Medicine

## 2017-10-10 ENCOUNTER — Other Ambulatory Visit: Payer: Self-pay | Admitting: Family Medicine

## 2017-10-10 ENCOUNTER — Other Ambulatory Visit: Payer: Self-pay | Admitting: Cardiovascular Disease

## 2017-10-11 ENCOUNTER — Telehealth (INDEPENDENT_AMBULATORY_CARE_PROVIDER_SITE_OTHER): Payer: Self-pay

## 2017-10-11 NOTE — Telephone Encounter (Signed)
Error

## 2017-10-11 NOTE — Telephone Encounter (Addendum)
Patient is approved for Euflexxa series, bilateral knee. Buy & Bill Covered at 100% of allowable amount after co-pay. $90.00 CO-pay each visit No PA required  Can you please schedule an appointment with Lovena Le or Dr. Estanislado Pandy?  Appointment to be scheduled in November, 2019.  Thank You

## 2017-10-11 NOTE — Telephone Encounter (Signed)
LMOM for patient to call and schedule Euflexxa injections in November.

## 2017-10-14 ENCOUNTER — Telehealth (INDEPENDENT_AMBULATORY_CARE_PROVIDER_SITE_OTHER): Payer: Self-pay

## 2017-10-14 NOTE — Telephone Encounter (Signed)
I called patient to schedule her Euflexxa injections.  Patient states she has questions about the approval for the Euflexxa and requested a return call before scheduling an appointment.

## 2017-10-14 NOTE — Telephone Encounter (Signed)
Called and left a VM advising patient to return my call concerning approval for Euflexxa injection.

## 2017-10-14 NOTE — Telephone Encounter (Signed)
Talked with patient concerning Euflexxa series, bilateral knee.  Patient prefers to have medication received through [facility].    Submitted VOB for Euflexxa series, bilateral knee for speciality pharmacy.

## 2017-10-18 ENCOUNTER — Telehealth: Payer: Self-pay | Admitting: Rheumatology

## 2017-10-18 NOTE — Telephone Encounter (Signed)
Patient called stating she ordered her Euflexxa medication and we should be receiving a call regarding delivery.  Patient states she was "told it is okay"  to schedule the injections in November with Lovena Le on Fridays at 1:00 pm.  Please advise

## 2017-10-19 NOTE — Telephone Encounter (Signed)
Awaiting delivery of medication.

## 2017-10-19 NOTE — Telephone Encounter (Signed)
Ok to schedule on Fridays at 1:00 pm.  I will be out of the office on Nov 1 though.

## 2017-10-20 ENCOUNTER — Telehealth: Payer: Self-pay | Admitting: *Deleted

## 2017-10-20 NOTE — Telephone Encounter (Signed)
Euflexxa to be delivered to office 10/21/17 BIL.

## 2017-10-21 NOTE — Telephone Encounter (Signed)
LMOM for patient to call and schedule Euflexxa injections.

## 2017-10-21 NOTE — Telephone Encounter (Signed)
Per Lovena Le : Ok to schedule on Fridays at 1:00 pm.  I will be out of the office on Nov 1 though. Please schedule Euflexxa bilateral knee patient purchased.

## 2017-10-27 ENCOUNTER — Other Ambulatory Visit: Payer: Self-pay | Admitting: Cardiovascular Disease

## 2017-10-27 NOTE — Telephone Encounter (Signed)
Xarelto 20mg  refill request received; pt is 67 yrs old, wt-130.6kg, Crea-0.81 on 11/18/16, last seen by Dr. Johnsie Cancel on 11/09/16, CrCl-135.42ml/min; will send in refill to requested pharmacy.

## 2017-11-10 ENCOUNTER — Other Ambulatory Visit: Payer: Self-pay | Admitting: Cardiovascular Disease

## 2017-11-14 ENCOUNTER — Ambulatory Visit: Payer: Medicare Other | Admitting: Obstetrics & Gynecology

## 2017-11-15 ENCOUNTER — Encounter: Payer: Self-pay | Admitting: Physician Assistant

## 2017-11-15 ENCOUNTER — Ambulatory Visit: Payer: Medicare Other | Admitting: Physician Assistant

## 2017-11-15 VITALS — BP 140/80 | HR 72 | Ht 67.0 in | Wt 299.1 lb

## 2017-11-15 DIAGNOSIS — R609 Edema, unspecified: Secondary | ICD-10-CM

## 2017-11-15 DIAGNOSIS — E782 Mixed hyperlipidemia: Secondary | ICD-10-CM

## 2017-11-15 DIAGNOSIS — R9431 Abnormal electrocardiogram [ECG] [EKG]: Secondary | ICD-10-CM | POA: Diagnosis not present

## 2017-11-15 DIAGNOSIS — Z6841 Body Mass Index (BMI) 40.0 and over, adult: Secondary | ICD-10-CM

## 2017-11-15 DIAGNOSIS — I10 Essential (primary) hypertension: Secondary | ICD-10-CM

## 2017-11-15 DIAGNOSIS — G4733 Obstructive sleep apnea (adult) (pediatric): Secondary | ICD-10-CM | POA: Diagnosis not present

## 2017-11-15 DIAGNOSIS — Z86718 Personal history of other venous thrombosis and embolism: Secondary | ICD-10-CM

## 2017-11-15 DIAGNOSIS — Z9989 Dependence on other enabling machines and devices: Secondary | ICD-10-CM

## 2017-11-15 MED ORDER — ATORVASTATIN CALCIUM 40 MG PO TABS: 40.0000 mg | ORAL_TABLET | Freq: Every day | ORAL | 3 refills | Status: DC

## 2017-11-15 MED ORDER — FUROSEMIDE 20 MG PO TABS: 20.0000 mg | ORAL_TABLET | Freq: Every day | ORAL | 3 refills | Status: DC

## 2017-11-15 MED ORDER — RIVAROXABAN 20 MG PO TABS: 20.0000 mg | ORAL_TABLET | Freq: Every day | ORAL | 0 refills | Status: DC

## 2017-11-15 MED ORDER — LISINOPRIL 40 MG PO TABS: 40.0000 mg | ORAL_TABLET | Freq: Every day | ORAL | 0 refills | Status: DC

## 2017-11-15 NOTE — Patient Instructions (Signed)
Medication Instructions:  Your physician recommends that you continue on your current medications as directed. Please refer to the Current Medication list given to you today.  If you need a refill on your cardiac medications before your next appointment, please call your pharmacy.   Lab work: Your physician recommends that you have a basic metabolic panel and a complete blood count drawn when you see your Primary Care Doctor next week  If you have labs (blood work) drawn today and your tests are completely normal, you will receive your results only by: Marland Kitchen MyChart Message (if you have MyChart) OR . A paper copy in the mail If you have any lab test that is abnormal or we need to change your treatment, we will call you to review the results.  Testing/Procedures: None ordered  Follow-Up: At [facility], you and your health needs are our priority.  As part of our continuing mission to provide you with exceptional heart care, we have created designated Provider Care Teams.  These Care Teams include your primary Cardiologist (physician) and Advanced Practice Providers (APPs -  Physician Assistants and Nurse Practitioners) who all work together to provide you with the care you need, when you need it. You will need a follow up appointment in 12 months.  Please call our office 2 months in advance to schedule this appointment.  You may see Jenkins Rouge, MD or one of the following Advanced Practice Providers on your designated Care Team:   Truitt Merle, NP Cecilie Kicks, NP . Kathyrn Drown, NP  Any Other Special Instructions Will Be Listed Below (If Applicable). Two Gram Sodium Diet 2000 mg  What is Sodium? Sodium is a mineral found naturally in many foods. The most significant source of sodium in the diet is table salt, which is about 40% sodium.  Processed, convenience, and preserved foods also contain a large amount of sodium.  The body needs only 500 mg of sodium daily to function,  A normal diet  provides more than enough sodium even if you do not use salt.  Why Limit Sodium? A build up of sodium in the body can cause thirst, increased blood pressure, shortness of breath, and water retention.  Decreasing sodium in the diet can reduce edema and risk of heart attack or stroke associated with high blood pressure.  Keep in mind that there are many other factors involved in these health problems.  Heredity, obesity, lack of exercise, cigarette smoking, stress and what you eat all play a role.  General Guidelines:  Do not add salt at the table or in cooking.  One teaspoon of salt contains over 2 grams of sodium.  Read food labels  Avoid processed and convenience foods  Ask your dietitian before eating any foods not dicussed in the menu planning guidelines  Consult your physician if you wish to use a salt substitute or a sodium containing medication such as antacids.  Limit milk and milk products to 16 oz (2 cups) per day.  Shopping Hints:  READ LABELS!! "Dietetic" does not necessarily mean low sodium.  Salt and other sodium ingredients are often added to foods during processing.   Menu Planning Guidelines Food Group Choose More Often Avoid  Beverages (see also the milk group All fruit juices, low-sodium, salt-free vegetables juices, low-sodium carbonated beverages Regular vegetable or tomato juices, commercially softened water used for drinking or cooking  Breads and Cereals Enriched white, wheat, rye and pumpernickel bread, hard rolls and dinner rolls; muffins, cornbread and waffles;  most dry cereals, cooked cereal without added salt; unsalted crackers and breadsticks; low sodium or homemade bread crumbs Bread, rolls and crackers with salted tops; quick breads; instant hot cereals; pancakes; commercial bread stuffing; self-rising flower and biscuit mixes; regular bread crumbs or cracker crumbs  Desserts and Sweets Desserts and sweets mad with mild should be within allowance Instant  pudding mixes and cake mixes  Fats Butter or margarine; vegetable oils; unsalted salad dressings, regular salad dressings limited to 1 Tbs; light, sour and heavy cream Regular salad dressings containing bacon fat, bacon bits, and salt pork; snack dips made with instant soup mixes or processed cheese; salted nuts  Fruits Most fresh, frozen and canned fruits Fruits processed with salt or sodium-containing ingredient (some dried fruits are processed with sodium sulfites        Vegetables Fresh, frozen vegetables and low- sodium canned vegetables Regular canned vegetables, sauerkraut, pickled vegetables, and others prepared in brine; frozen vegetables in sauces; vegetables seasoned with ham, bacon or salt pork  Condiments, Sauces, Miscellaneous  Salt substitute with physician's approval; pepper, herbs, spices; vinegar, lemon or lime juice; hot pepper sauce; garlic powder, onion powder, low sodium soy sauce (1 Tbs.); low sodium condiments (ketchup, chili sauce, mustard) in limited amounts (1 tsp.) fresh ground horseradish; unsalted tortilla chips, pretzels, potato chips, popcorn, salsa (1/4 cup) Any seasoning made with salt including garlic salt, celery salt, onion salt, and seasoned salt; sea salt, rock salt, kosher salt; meat tenderizers; monosodium glutamate; mustard, regular soy sauce, barbecue, sauce, chili sauce, teriyaki sauce, steak sauce, Worcestershire sauce, and most flavored vinegars; canned gravy and mixes; regular condiments; salted snack foods, olives, picles, relish, horseradish sauce, catsup   Food preparation: Try these seasonings Meats:    Pork Sage, onion Serve with applesauce  Chicken Poultry seasoning, thyme, parsley Serve with cranberry sauce  Lamb Curry powder, rosemary, garlic, thyme Serve with mint sauce or jelly  Veal Marjoram, basil Serve with current jelly, cranberry sauce  Beef Pepper, bay leaf Serve with dry mustard, unsalted chive butter  Fish [place] leaf, dill Serve with  unsalted lemon butter, unsalted parsley butter  Vegetables:    Asparagus Lemon juice   Broccoli Lemon juice   Carrots Mustard dressing parsley, mint, nutmeg, glazed with unsalted butter and sugar   Green beans Marjoram, lemon juice, nutmeg,dill seed   Tomatoes Basil, marjoram, onion   Spice /blend for [employer]" 4 tsp ground thyme 1 tsp ground sage 3 tsp ground rosemary 4 tsp ground marjoram   Test your knowledge 1. A product that says "Salt Free" may still contain sodium. True or False 2. Garlic Powder and Hot Pepper Sauce an be used as alternative seasonings.True or False 3. Processed foods have more sodium than fresh foods.  True or False 4. Canned Vegetables have less sodium than froze True or False  WAYS TO DECREASE YOUR SODIUM INTAKE 1. Avoid the use of added salt in cooking and at the table.  Table salt (and other prepared seasonings which contain salt) is probably one of the greatest sources of sodium in the diet.  Unsalted foods can gain flavor from the sweet, sour, and butter taste sensations of herbs and spices.  Instead of using salt for seasoning, try the following seasonings with the foods listed.  Remember: how you use them to enhance natural food flavors is limited only by your creativity... Allspice-Meat, fish, eggs, fruit, peas, red and yellow vegetables Almond Extract-Fruit baked goods Anise Seed-Sweet breads, fruit, carrots, beets, cottage cheese, cookies (  tastes like licorice) Basil-Meat, fish, eggs, vegetables, rice, vegetables salads, soups, sauces Bay Leaf-Meat, fish, stews, poultry Burnet-Salad, vegetables (cucumber-like flavor) Caraway Seed-Bread, cookies, cottage cheese, meat, vegetables, cheese, rice Cardamon-Baked goods, fruit, soups Celery Powder or seed-Salads, salad dressings, sauces, meatloaf, soup, bread.Do not use  celery salt Chervil-Meats, salads, fish, eggs, vegetables, cottage cheese (parsley-like flavor) Chili Power-Meatloaf, chicken cheese,  corn, eggplant, egg dishes Chives-Salads cottage cheese, egg dishes, soups, vegetables, sauces Cilantro-Salsa, casseroles Cinnamon-Baked goods, fruit, pork, lamb, chicken, carrots Cloves-Fruit, baked goods, fish, pot roast, green beans, beets, carrots Coriander-Pastry, cookies, meat, salads, cheese (lemon-orange flavor) Cumin-Meatloaf, fish,cheese, eggs, cabbage,fruit pie (caraway flavor) [employer], fruit, eggs, fish, poultry, cottage cheese, vegetables Dill Seed-Meat, cottage cheese, poultry, vegetables, fish, salads, bread Fennel Seed-Bread, cookies, apples, pork, eggs, fish, beets, cabbage, cheese, Licorice-like flavor Garlic-(buds or powder) Salads, meat, poultry, fish, bread, butter, vegetables, potatoes.Do not  use garlic salt Ginger-Fruit, vegetables, baked goods, meat, fish, poultry Horseradish Root-Meet, vegetables, butter Lemon Juice or Extract-Vegetables, fruit, tea, baked goods, fish salads Mace-Baked goods fruit, vegetables, fish, poultry (taste like nutmeg) Maple Extract-Syrups Marjoram-Meat, chicken, fish, vegetables, breads, green salads (taste like Sage) Mint-Tea, lamb, sherbet, vegetables, desserts, carrots, cabbage Mustard, Dry or Seed-Cheese, eggs, meats, vegetables, poultry Nutmeg-Baked goods, fruit, chicken, eggs, vegetables, desserts Onion Powder-Meat, fish, poultry, vegetables, cheese, eggs, bread, rice salads (Do not use   Onion salt) Orange Extract-Desserts, baked goods Oregano-Pasta, eggs, cheese, onions, pork, lamb, fish, chicken, vegetables, green salads Paprika-Meat, fish, poultry, eggs, cheese, vegetables Parsley Flakes-Butter, vegetables, meat fish, poultry, eggs, bread, salads (certain forms may   Contain sodium Pepper-Meat fish, poultry, vegetables, eggs Peppermint Extract-Desserts, baked goods Poppy Seed-Eggs, bread, cheese, fruit dressings, baked goods, noodles, vegetables, cottage  [employer],  poultry, meat, fish, cauliflower, turnips,eggs bread Saffron-Rice, bread, veal, chicken, fish, eggs Sage-Meat, fish, poultry, onions, eggplant, tomateos, pork, stews Savory-Eggs, salads, poultry, meat, rice, vegetables, soups, pork Tarragon-Meat, poultry, fish, eggs, butter, vegetables (licorice-like flavor)  Thyme-Meat, poultry, fish, eggs, vegetables, (clover-like flavor), sauces, soups Tumeric-Salads, butter, eggs, fish, rice, vegetables (saffron-like flavor) Vanilla Extract-Baked goods, candy Vinegar-Salads, vegetables, meat marinades Walnut Extract-baked goods, candy  2. Choose your Foods Wisely   The following is a list of foods to avoid which are high in sodium:  Meats-Avoid all smoked, canned, salt cured, dried and kosher meat and fish as well as Anchovies   [employer] meats:Bologna, Liverwurst, Pastrami Canned meat or fish  Marinated herring Caviar    Pepperoni Corned Beef   Pizza Dried chipped beef  Salami Frozen breaded fish or meat Salt pork Frankfurters or hot dogs  Sardines Gefilte fish   Sausage Ham (boiled ham, Proscuitto Smoked butt    spiced ham)   Spam      TV Dinners Vegetables Canned vegetables (Regular) Relish Canned mushrooms  Sauerkraut Olives    Tomato juice Pickles  Bakery and Dessert Products Canned puddings  Cream pies Cheesecake   Decorated cakes Cookies  Beverages/Juices Tomato juice, regular  Gatorade   V-8 vegetable juice, regular  Breads and Cereals Biscuit mixes   Salted potato chips, corn chips, pretzels Bread stuffing mixes  Salted crackers and rolls Pancake and waffle mixes Self-rising flour  Seasonings Accent    Meat sauces Barbecue sauce  Meat tenderizer Catsup    Monosodium glutamate (MSG) Celery salt   Onion salt Chili sauce   Prepared mustard Garlic salt   Salt, seasoned salt, sea salt Gravy mixes   Soy sauce Horseradish   Steak sauce  Ketchup   Tartar sauce Lite salt    Teriyaki sauce Marinade  mixes   Worcestershire sauce  Others Baking powder   Cocoa and cocoa mixes Baking soda   Commercial casserole mixes Candy-caramels, chocolate  Dehydrated soups    Bars, fudge,nougats  Instant rice and pasta mixes Canned broth or soup  Maraschino cherries Cheese, aged and processed cheese and cheese spreads  Learning Assessment Quiz  Indicated T (for True) or F (for False) for each of the following statements:  1. _____ Fresh fruits and vegetables and unprocessed grains are generally low in sodium 2. _____ Water may contain a considerable amount of sodium, depending on the source 3. _____ You can always tell if a food is high in sodium by tasting it 4. _____ Certain laxatives my be high in sodium and should be avoided unless prescribed   by a physician or pharmacist 5. _____ Salt substitutes may be used freely by anyone on a sodium restricted diet 6. _____ Sodium is present in table salt, food additives and as a natural component of   most foods 7. _____ Table salt is approximately 90% sodium 8. _____ Limiting sodium intake may help prevent excess fluid accumulation in the body 9. _____ On a sodium-restricted diet, seasonings such as bouillon soy sauce, and    cooking wine should be used in place of table salt 10. _____ On an ingredient list, a product which lists monosodium glutamate as the first   ingredient is an appropriate food to include on a low sodium diet  Circle the best answer(s) to the following statements (Hint: there may be more than one correct answer)  11. On a low-sodium diet, some acceptable snack items are:    A. Olives  F. Bean dip   K. Grapefruit juice    B. Salted Pretzels G. Commercial Popcorn   L. Canned peaches    C. Carrot Sticks  H. Bouillon   M. Unsalted nuts   D. [place] fries  I. Peanut butter crackers N. Salami   E. Sweet pickles J. Tomato Juice   O. Pizza  12.  Seasonings that may be used freely on a reduced - sodium diet include   A. Lemon  wedges F.Monosodium glutamate K. Celery seed    B.Soysauce   G. Pepper   L. Mustard powder   C. Sea salt  H. Cooking wine  M. Onion flakes   D. Vinegar  E. Prepared horseradish N. Salsa   E. Sage   J. Worcestershire sauce  O. Chutney

## 2017-11-15 NOTE — Progress Notes (Signed)
Cardiology Office Note    Date:  11/15/2017   ID:  [name], [name] 13-Jun-1950, MRN [id number]  PCP:  Shawnee Knapp, MD  Cardiologist: Jenkins Rouge, MD EPS: None  Chief Complaint  Patient presents with  . Follow-up    History of Present Illness:  [name] is a 67 y.o. 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  She has been kept on anticoagulation and compression hose.  She also has an abnormal EKG in the past with poor R wave progression suspected from lead placement and body habitus.  She has normal LV function.  Has severe sleep apnea on CPAP.  Also has hypertension, hyperlipidemia.  Last saw Dr. Johnsie Cancel 10/2016 and he stopped Prinzide and increase lisinopril to 40 mg and change diuretic to Lasix 20 mg daily.  Patient here for yearly f/u. Still having problems with her left leg. Working with physical therapy and doing pool exercise. She is wondering if something is wrong inside her leg that's causing her weakness although PT says the strength is there. Has lost 25 lbs. BP up today.  Has been eating more fast food recently.  Denies chest pain, shortness of breath, dyspnea on exertion, dizziness or presyncope.   Past Medical History:  Diagnosis Date  . Anemia    oral iron occ.  . Arthritis   . Basal cell carcinoma of nose   . Bronchitis   . Cancer of ovary ([place]) 2005   Stage 1A, BRCA 1/2 neg 6/14  . Degenerative joint disease of both hips 2019  . Diverticulitis    past history  . DVT (deep vein thrombosis) in pregnancy    LEFT LEG  . Family history of breast cancer   . 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 FH: genetic disease carrier 11/05/2016  . Genetic testing 11/04/2016   Multi-Cancer panel (83 genes) @ [facility] - see report; special interpretation  . History of ovarian  cancer   . HTN (hypertension)   . Hyperlipidemia   . OSA (obstructive sleep apnea)    hypoventilation, on CPAP  . Osteopenia 2019  . Shingles     Past Surgical History:  Procedure Laterality Date  . BASAL CELL CARCINOMA EXCISION     resection with plastic surgery reconstrustion  . bilateral breast mass  1970   benigned, left breast  . cataract surgery Bilateral [id number], 2005   with bilateral lens replacements  . COLONOSCOPY WITH PROPOFOL N/A 05/21/2014   Procedure: COLONOSCOPY WITH PROPOFOL;  Surgeon: Juanita Craver, MD;  Location: [facility];  Service: Endoscopy;  Laterality: N/A;  . HERNIA REPAIR  [id number]   umbilical repair with mesh   . TOTAL ABDOMINAL HYSTERECTOMY W/ BILATERAL SALPINGOOPHORECTOMY  2005   Stage IA ovarian cancer    Current Medications: Current Meds  Medication Sig  . ACETAMINOPHEN PO Take 1,300 mg by mouth 3 (three) times daily.   Marland Kitchen atorvastatin (LIPITOR) 40 MG tablet Take 1 tablet (40 mg total) by mouth daily.  . Calcium Carb-Cholecalciferol (CALCIUM 600 + D PO) Take 1 tablet daily by mouth.  . cyclobenzaprine (FLEXERIL) 5 MG tablet TAKE 1 TO 2 TABLETS BY MOUTH DAILY AT BEDTIME AS NEEDED FOR SPASMS  . EUFLEXXA 20 MG/2ML SOSY   . fish oil-omega-3 fatty acids 1000 MG capsule Take 1-2 g by mouth 2 (two) times  daily.   . fluticasone (FLONASE) 50 MCG/ACT nasal spray USE 2 SPRAYS IN EACH NOSTRIL AT BEDTIME  . furosemide (LASIX) 20 MG tablet Take 1 tablet (20 mg total) by mouth daily.  Marland Kitchen lisinopril (PRINIVIL,ZESTRIL) 40 MG tablet Take 1 tablet (40 mg total) by mouth daily.  . Multiple Vitamin (MULTIVITAMIN) tablet Take 1 tablet by mouth every morning.   . niacin 500 MG tablet Take 500 mg by mouth every evening.   . [DISCONTINUED] atorvastatin (LIPITOR) 40 MG tablet Take 1 tablet (40 mg total) daily by mouth.  . [DISCONTINUED] furosemide (LASIX) 20 MG tablet Take 1 tablet (20 mg total) by mouth daily. Please keep upcoming appt in November for future refills. Thank  you (Patient taking differently: Take 20 mg by mouth daily. )  . [DISCONTINUED] lisinopril (PRINIVIL,ZESTRIL) 40 MG tablet Take 1 tablet (40 mg total) by mouth daily. Please keep upcoming appt in November for future refills. Thank you (Patient taking differently: Take 40 mg by mouth daily. )  . [DISCONTINUED] XARELTO 20 MG TABS tablet TAKE 1 TABLET BY MOUTH EVERY DAY     Allergies:   Valtrex [valacyclovir hcl]; Benoxinate base; Shingrix [zoster vac recomb adjuvanted]; Sulfonamide derivatives; and Ivp dye [iodinated diagnostic agents]   Social History   Socioeconomic History  . Marital status: Single    Spouse name: Not on file  . Number of children: 0  . Years of education: masters  . Highest education level: Master's degree (e.g., MA, MS, MEng, MEd, MSW, MBA)  Occupational History  . 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 Financial resource strain: Not hard at all  . Food insecurity:    Worry: Never true    Inability: Never true  . Transportation needs:    Medical: No    Non-medical: No  Tobacco Use  . Smoking status: Never Smoker  . Smokeless tobacco: Never Used  . Tobacco comment: only for 6 months in the 70s  Substance and Sexual Activity  . Alcohol use: Yes    Alcohol/week: 0.0 standard drinks    Comment: rare, 2 yearly  . Drug use: Never  . Sexual activity: Never    Partners: Female    Birth control/protection: Surgical  Lifestyle  . Physical activity:    Days per week: 3 days    Minutes per session: 60 min  . Stress: Not at all  Relationships  . Social connections:    Talks on phone: More than three times a week    Gets together: More than three times a week    Attends religious service: Never    Active member of club or organization: Yes    Attends meetings of clubs or organizations: 1 to 4 times per year    Relationship status: Never married  Other Topics Concern  . 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 PSH showed an AHI of 120!!!! titrated to 8 cm watr cPAP , residual AHI of 1 and downlaod was done  01-23-11 .  excellent compliance - 7 hours and low leak,  nasal pillow mask.    BMI is considered morbidly obese.  discussed low carb  diet - weight watchers and restricted exercise. , aquatic .    Patient cannot exercise due to soreness in proximal muscles.and  would like to add coenzyme q 10 and carnitine .  She needs a medical weight loss regimen - bariatric surgery?       FSS 48, Epworth 4 again ,  CMS compliant  residual AHI 1.1 and average use 6.48  hours , no naps    Patient is single and lives alone.   Patient does not have any children.   Patient is retired.   Patient has a Master's degree.   Patient is left-handed.   Patient drinks tea occasionally.               Family History:  The patient'sfamily history includes Alzheimer's disease in her father and mother; Breast cancer (age of onset: 72) in her other; Breast cancer (age of onset: 50) in her sister; Colon cancer (age of onset: 52) in her father; Hypertension in her brother, father, mother, and sister; Prostate cancer in her other; Prostate cancer (age of onset: 72) in her brother.   ROS:   Please see the history of present illness.    Review of Systems  Constitution: Positive for weight loss.  HENT: Negative.   Eyes: Negative.   Cardiovascular: Negative.   Respiratory: Negative.   Hematologic/Lymphatic: Bruises/bleeds easily.  Musculoskeletal: Positive for back pain and myalgias. Negative for joint pain.  Gastrointestinal: Negative.   Genitourinary: Negative.   Neurological: Negative.    All other systems reviewed and are negative.   PHYSICAL EXAM:   VS:  BP 140/80   Pulse 72   Ht _0  (1.702 m)   Wt 299 lb 1.9 oz (135.7 kg)   LMP 01/12/2003   SpO2 98%   BMI 46.85 kg/m   Physical Exam  GEN: Obese, in no acute distress   Neck: no JVD, carotid bruits, or masses Cardiac:RRR; no murmurs, rubs, or gallops  Respiratory:  clear to auscultation bilaterally, normal work of breathing GI: soft, nontender, nondistended, + BS Ext: without cyanosis, clubbing, or edema, Good distal pulses bilaterally Neuro:  Alert and Oriented x 3 Psych: euthymic mood, full affect  Wt Readings from Last 3 Encounters:  11/15/17 299 lb 1.9 oz (135.7 kg)  09/27/17 288 lb (130.6 kg)  07/28/17 297 lb (134.7 kg)      Studies/Labs Reviewed:   EKG:  EKG is  ordered today.  The ekg ordered today demonstrates normal sinus rhythm nonspecific ST-T wave changes, no acute change  Recent Labs: 11/18/2016: ALT 23; BUN 20; Creatinine, Ser 0.81; Hemoglobin 13.3; Platelets 196; Potassium 4.1; Sodium 142; TSH 2.040   Lipid Panel    Component Value Date/Time   CHOL 175 11/18/2016 1221   TRIG 204 (H) 11/18/2016 1221   HDL 52 11/18/2016 1221   CHOLHDL 3.4 11/18/2016 1221   CHOLHDL 3.8 10/16/2015 0948   VLDL 51 (H) 10/16/2015 0948   LDLCALC 82 11/18/2016 1221    Additional studies/ records that were reviewed today include:      ASSESSMENT:    1. Essential hypertension, benign   2. OSA on CPAP   3. Edema, unspecified type   4. ELECTROCARDIOGRAM, ABNORMAL   5. Class 3 severe obesity due to excess calories without serious comorbidity with body mass index (BMI) of 50.0 to 59.9 in adult ([place])   6. History of DVT (deep vein thrombosis)   7. Mixed hyperlipidemia      PLAN:  In order of problems listed above:   Essential hypertension blood pressure up today but on recheck it was 140/80.  Usually is stable at home.  She has been eating more fast food recently.  Recommend 2  g sodium diet.  Patient will keep track of her blood pressure at home and call us if it remains over 135/85.  Follow up with Dr. Johnsie Cancel in 1 year.  OSA on CPAP  Chronic lower extremity edema managed with diuretics and compression stockings-also doing physical therapy.   Still complains of left leg weakness but do not think it is vascular at all.  Excellent distal pulses no erythema or significant edema.  Also has hip problems.  Follow-up with PT and orthopedics  Abnormal EKG unchanged from prior tracings no symptoms  Class III severe obesity exercise and weight loss recommended for overall health  History of DVT postphlebitic syndrome is maintained on Xarelto surveillance labs to be done by Dr. Brigitte Pulse 11/21/2017  Hyperlipidemia on Crestor will have fasting lipid panels done in 2 weeks.   Medication Adjustments/Labs and Tests Ordered: Current medicines are reviewed at length with the patient today.  Concerns regarding medicines are outlined above.  Medication changes, Labs and Tests ordered today are listed in the Patient Instructions below. Patient Instructions   Medication Instructions:  Your physician recommends that you continue on your current medications as directed. Please refer to the Current Medication list given to you today.  If you need a refill on your cardiac medications before your next appointment, please call your pharmacy.   Lab work: Your physician recommends that you have a basic metabolic panel and a complete blood count drawn when you see your Primary Care Doctor next week  If you have labs (blood work) drawn today and your tests are completely normal, you will receive your results only by: Marland Kitchen MyChart Message (if you have MyChart) OR . A paper copy in the mail If you have any lab test that is abnormal or we need to change your treatment, we will call you to review the results.  Testing/Procedures: None ordered  Follow-Up: At [facility] you and your health needs are our priority.  As part of our continuing mission to provide you with exceptional heart care, we have created designated Provider Care Teams.  These Care Teams include your primary Cardiologist (physician) and Advanced Practice Providers (APPs -  Physician Assistants  and Nurse Practitioners) who all work together to provide you with the care you need, when you need it. You will need a follow up appointment in 12 months.  Please call our office 2 months in advance to schedule this appointment.  You may see Jenkins Rouge, MD or one of the following Advanced Practice Providers on your designated Care Team:   Truitt Merle, NP Cecilie Kicks, NP . Kathyrn Drown, NP  Any Other Special Instructions Will Be Listed Below (If Applicable). Two Gram Sodium Diet 2000 mg  What is Sodium? Sodium is a mineral found naturally in many foods. The most significant source of sodium in the diet is table salt, which is about 40% sodium.  Processed, convenience, and preserved foods also contain a large amount of sodium.  The body needs only 500 mg of sodium daily to function,  A normal diet provides more than enough sodium even if you do not use salt.  Why Limit Sodium? A build up of sodium in the body can cause thirst, increased blood pressure, shortness of breath, and water retention.  Decreasing sodium in the diet can reduce edema and risk of heart attack or stroke associated with high blood pressure.  Keep in mind that there are many other factors involved in these health problems.  Heredity, obesity, lack  of exercise, cigarette smoking, stress and what you eat all play a role.  General Guidelines:  Do not add salt at the table or in cooking.  One teaspoon of salt contains over 2 grams of sodium.  Read food labels  Avoid processed and convenience foods  Ask your dietitian before eating any foods not dicussed in the menu planning guidelines  Consult your physician if you wish to use a salt substitute or a sodium containing medication such as antacids.  Limit milk and milk products to 16 oz (2 cups) per day.  Shopping Hints:  READ LABELS!! "Dietetic" does not necessarily mean low sodium.  Salt and other sodium ingredients are often added to foods during  processing.   Menu Planning Guidelines Food Group Choose More Often Avoid  Beverages (see also the milk group All fruit juices, low-sodium, salt-free vegetables juices, low-sodium carbonated beverages Regular vegetable or tomato juices, commercially softened water used for drinking or cooking  Breads and Cereals Enriched white, wheat, rye and pumpernickel bread, hard rolls and dinner rolls; muffins, cornbread and waffles; most dry cereals, cooked cereal without added salt; unsalted crackers and breadsticks; low sodium or homemade bread crumbs Bread, rolls and crackers with salted tops; quick breads; instant hot cereals; pancakes; commercial bread stuffing; self-rising flower and biscuit mixes; regular bread crumbs or cracker crumbs  Desserts and Sweets Desserts and sweets mad with mild should be within allowance Instant pudding mixes and cake mixes  Fats Butter or margarine; vegetable oils; unsalted salad dressings, regular salad dressings limited to 1 Tbs; light, sour and heavy cream Regular salad dressings containing bacon fat, bacon bits, and salt pork; snack dips made with instant soup mixes or processed cheese; salted nuts  Fruits Most fresh, frozen and canned fruits Fruits processed with salt or sodium-containing ingredient (some dried fruits are processed with sodium sulfites        Vegetables Fresh, frozen vegetables and low- sodium canned vegetables Regular canned vegetables, sauerkraut, pickled vegetables, and others prepared in brine; frozen vegetables in sauces; vegetables seasoned with ham, bacon or salt pork  Condiments, Sauces, Miscellaneous  Salt substitute with physician's approval; pepper, herbs, spices; vinegar, lemon or lime juice; hot pepper sauce; garlic powder, onion powder, low sodium soy sauce (1 Tbs.); low sodium condiments (ketchup, chili sauce, mustard) in limited amounts (1 tsp.) fresh ground horseradish; unsalted tortilla chips, pretzels, potato chips, popcorn, salsa  (1/4 cup) Any seasoning made with salt including garlic salt, celery salt, onion salt, and seasoned salt; sea salt, rock salt, kosher salt; meat tenderizers; monosodium glutamate; mustard, regular soy sauce, barbecue, sauce, chili sauce, teriyaki sauce, steak sauce, Worcestershire sauce, and most flavored vinegars; canned gravy and mixes; regular condiments; salted snack foods, olives, picles, relish, horseradish sauce, catsup   Food preparation: Try these seasonings Meats:    Pork Sage, onion Serve with applesauce  Chicken Poultry seasoning, thyme, parsley Serve with cranberry sauce  Lamb Curry powder, rosemary, garlic, thyme Serve with mint sauce or jelly  Veal Marjoram, basil Serve with current jelly, cranberry sauce  Beef Pepper, bay leaf Serve with dry mustard, unsalted chive butter  Fish [place] leaf, dill Serve with unsalted lemon butter, unsalted parsley butter  Vegetables:    Asparagus Lemon juice   Broccoli Lemon juice   Carrots Mustard dressing parsley, mint, nutmeg, glazed with unsalted butter and sugar   Green beans Marjoram, lemon juice, nutmeg,dill seed   Tomatoes Basil, marjoram, onion   Spice /blend for [employer]" 4 tsp ground thyme 1  tsp ground sage 3 tsp ground rosemary 4 tsp ground marjoram   Test your knowledge 1. A product that says "Salt Free" may still contain sodium. True or False 2. Garlic Powder and Hot Pepper Sauce an be used as alternative seasonings.True or False 3. Processed foods have more sodium than fresh foods.  True or False 4. Canned Vegetables have less sodium than froze True or False  WAYS TO DECREASE YOUR SODIUM INTAKE 1. Avoid the use of added salt in cooking and at the table.  Table salt (and other prepared seasonings which contain salt) is probably one of the greatest sources of sodium in the diet.  Unsalted foods can gain flavor from the sweet, sour, and butter taste sensations of herbs and spices.  Instead of using salt for seasoning, try the  following seasonings with the foods listed.  Remember: how you use them to enhance natural food flavors is limited only by your creativity... Allspice-Meat, fish, eggs, fruit, peas, red and yellow vegetables Almond Extract-Fruit baked goods Anise Seed-Sweet breads, fruit, carrots, beets, cottage cheese, cookies (tastes like licorice) Basil-Meat, fish, eggs, vegetables, rice, vegetables salads, soups, sauces Bay Leaf-Meat, fish, stews, poultry Burnet-Salad, vegetables (cucumber-like flavor) Caraway Seed-Bread, cookies, cottage cheese, meat, vegetables, cheese, rice Cardamon-Baked goods, fruit, soups Celery Powder or seed-Salads, salad dressings, sauces, meatloaf, soup, bread.Do not use  celery salt Chervil-Meats, salads, fish, eggs, vegetables, cottage cheese (parsley-like flavor) Chili Power-Meatloaf, chicken cheese, corn, eggplant, egg dishes Chives-Salads cottage cheese, egg dishes, soups, vegetables, sauces Cilantro-Salsa, casseroles Cinnamon-Baked goods, fruit, pork, lamb, chicken, carrots Cloves-Fruit, baked goods, fish, pot roast, green beans, beets, carrots Coriander-Pastry, cookies, meat, salads, cheese (lemon-orange flavor) Cumin-Meatloaf, fish,cheese, eggs, cabbage,fruit pie (caraway flavor) [employer], fruit, eggs, fish, poultry, cottage cheese, vegetables Dill Seed-Meat, cottage cheese, poultry, vegetables, fish, salads, bread Fennel Seed-Bread, cookies, apples, pork, eggs, fish, beets, cabbage, cheese, Licorice-like flavor Garlic-(buds or powder) Salads, meat, poultry, fish, bread, butter, vegetables, potatoes.Do not  use garlic salt Ginger-Fruit, vegetables, baked goods, meat, fish, poultry Horseradish Root-Meet, vegetables, butter Lemon Juice or Extract-Vegetables, fruit, tea, baked goods, fish salads Mace-Baked goods fruit, vegetables, fish, poultry (taste like nutmeg) Maple Extract-Syrups Marjoram-Meat, chicken, fish, vegetables, breads, green salads (taste like  Sage) Mint-Tea, lamb, sherbet, vegetables, desserts, carrots, cabbage Mustard, Dry or Seed-Cheese, eggs, meats, vegetables, poultry Nutmeg-Baked goods, fruit, chicken, eggs, vegetables, desserts Onion Powder-Meat, fish, poultry, vegetables, cheese, eggs, bread, rice salads (Do not use   Onion salt) Orange Extract-Desserts, baked goods Oregano-Pasta, eggs, cheese, onions, pork, lamb, fish, chicken, vegetables, green salads Paprika-Meat, fish, poultry, eggs, cheese, vegetables Parsley Flakes-Butter, vegetables, meat fish, poultry, eggs, bread, salads (certain forms may   Contain sodium Pepper-Meat fish, poultry, vegetables, eggs Peppermint Extract-Desserts, baked goods Poppy Seed-Eggs, bread, cheese, fruit dressings, baked goods, noodles, vegetables, cottage  [employer], poultry, meat, fish, cauliflower, turnips,eggs bread Saffron-Rice, bread, veal, chicken, fish, eggs Sage-Meat, fish, poultry, onions, eggplant, tomateos, pork, stews Savory-Eggs, salads, poultry, meat, rice, vegetables, soups, pork Tarragon-Meat, poultry, fish, eggs, butter, vegetables (licorice-like flavor)  Thyme-Meat, poultry, fish, eggs, vegetables, (clover-like flavor), sauces, soups Tumeric-Salads, butter, eggs, fish, rice, vegetables (saffron-like flavor) Vanilla Extract-Baked goods, candy Vinegar-Salads, vegetables, meat marinades Walnut Extract-baked goods, candy  2. Choose your Foods Wisely   The following is a list of foods to avoid which are high in sodium:  Meats-Avoid all smoked, canned, salt cured, dried and kosher meat and fish as well as Anchovies   [employer] meats:Bologna, Liverwurst, Pastrami Canned meat or  fish  Marinated herring Caviar    Pepperoni Corned Beef   Pizza Dried chipped beef  Salami Frozen breaded fish or meat Salt pork Frankfurters or hot dogs  Sardines Gefilte fish   Sausage Ham (boiled ham, Proscuitto Smoked butt    spiced  ham)   Spam      TV Dinners Vegetables Canned vegetables (Regular) Relish Canned mushrooms  Sauerkraut Olives    Tomato juice Pickles  Bakery and Dessert Products Canned puddings  Cream pies Cheesecake   Decorated cakes Cookies  Beverages/Juices Tomato juice, regular  Gatorade   V-8 vegetable juice, regular  Breads and Cereals Biscuit mixes   Salted potato chips, corn chips, pretzels Bread stuffing mixes  Salted crackers and rolls Pancake and waffle mixes Self-rising flour  Seasonings Accent    Meat sauces Barbecue sauce  Meat tenderizer Catsup    Monosodium glutamate (MSG) Celery salt   Onion salt Chili sauce   Prepared mustard Garlic salt   Salt, seasoned salt, sea salt Gravy mixes   Soy sauce Horseradish   Steak sauce Ketchup   Tartar sauce Lite salt    Teriyaki sauce Marinade mixes   Worcestershire sauce  Others Baking powder   Cocoa and cocoa mixes Baking soda   Commercial casserole mixes Candy-caramels, chocolate  Dehydrated soups    Bars, fudge,nougats  Instant rice and pasta mixes Canned broth or soup  Maraschino cherries Cheese, aged and processed cheese and cheese spreads  Learning Assessment Quiz  Indicated T (for True) or F (for False) for each of the following statements:  1. _____ Fresh fruits and vegetables and unprocessed grains are generally low in sodium 2. _____ Water may contain a considerable amount of sodium, depending on the source 3. _____ You can always tell if a food is high in sodium by tasting it 4. _____ Certain laxatives my be high in sodium and should be avoided unless prescribed   by a physician or pharmacist 5. _____ Salt substitutes may be used freely by anyone on a sodium restricted diet 6. _____ Sodium is present in table salt, food additives and as a natural component of   most foods 7. _____ Table salt is approximately 90% sodium 8. _____ Limiting sodium intake may help prevent excess fluid accumulation in the  body 9. _____ On a sodium-restricted diet, seasonings such as bouillon soy sauce, and    cooking wine should be used in place of table salt 10. _____ On an ingredient list, a product which lists monosodium glutamate as the first   ingredient is an appropriate food to include on a low sodium diet  Circle the best answer(s) to the following statements (Hint: there may be more than one correct answer)  11. On a low-sodium diet, some acceptable snack items are:    A. Olives  F. Bean dip   K. Grapefruit juice    B. Salted Pretzels G. Commercial Popcorn   L. Canned peaches    C. Carrot Sticks  H. Bouillon   M. Unsalted nuts   D. [place] fries  I. Peanut butter crackers N. Salami   E. Sweet pickles J. Tomato Juice   O. Pizza  12.  Seasonings that may be used freely on a reduced - sodium diet include   A. Lemon wedges F.Monosodium glutamate K. Celery seed    B.Soysauce   G. Pepper   L. Mustard powder   C. Sea salt  H. Cooking wine  M. Onion flakes   D. Vinegar  E. Prepared horseradish N. Salsa   E. Sage   J. Worcestershire sauce  O. Chutney       Signed, Ermalinda Barrios, PA-C  11/15/2017 1:01 PM    [facility] [place], [place], [place]  [address] Phone: [phone]; Fax: [phone]

## 2017-11-21 ENCOUNTER — Other Ambulatory Visit: Payer: Self-pay

## 2017-11-21 ENCOUNTER — Encounter: Payer: Self-pay | Admitting: Family Medicine

## 2017-11-21 ENCOUNTER — Ambulatory Visit: Payer: Medicare Other | Admitting: Family Medicine

## 2017-11-21 VITALS — BP 128/78 | HR 84 | Temp 98.0°F | Resp 16 | Wt 298.0 lb

## 2017-11-21 DIAGNOSIS — G4733 Obstructive sleep apnea (adult) (pediatric): Secondary | ICD-10-CM

## 2017-11-21 DIAGNOSIS — E782 Mixed hyperlipidemia: Secondary | ICD-10-CM

## 2017-11-21 DIAGNOSIS — E559 Vitamin D deficiency, unspecified: Secondary | ICD-10-CM

## 2017-11-21 DIAGNOSIS — Z0001 Encounter for general adult medical examination with abnormal findings: Secondary | ICD-10-CM | POA: Diagnosis not present

## 2017-11-21 DIAGNOSIS — Z23 Encounter for immunization: Secondary | ICD-10-CM | POA: Diagnosis not present

## 2017-11-21 DIAGNOSIS — I10 Essential (primary) hypertension: Secondary | ICD-10-CM

## 2017-11-21 DIAGNOSIS — Z9989 Dependence on other enabling machines and devices: Secondary | ICD-10-CM

## 2017-11-21 DIAGNOSIS — Z0000 Encounter for general adult medical examination without abnormal findings: Secondary | ICD-10-CM

## 2017-11-21 DIAGNOSIS — D509 Iron deficiency anemia, unspecified: Secondary | ICD-10-CM

## 2017-11-21 LAB — POCT URINALYSIS DIP (MANUAL ENTRY)
BILIRUBIN UA: NEGATIVE mg/dL
Bilirubin, UA: NEGATIVE
Blood, UA: NEGATIVE
Glucose, UA: NEGATIVE mg/dL
Leukocytes, UA: NEGATIVE
Nitrite, UA: NEGATIVE
PH UA: 6 (ref 5.0–8.0)
Protein Ur, POC: NEGATIVE mg/dL
SPEC GRAV UA: 1.02 (ref 1.010–1.025)
Urobilinogen, UA: 0.2 E.U./dL

## 2017-11-21 MED ORDER — FLUTICASONE PROPIONATE 50 MCG/ACT NA SUSP: 2.0000 | Freq: Every day | NASAL | 3 refills | Status: DC

## 2017-11-21 NOTE — Patient Instructions (Addendum)
If you have lab work done today you will be contacted with your lab results within the next 2 weeks.  If you have not heard from [place] then please contact us. The fastest way to get your results is to register for My Chart.   IF you received an x-ray today, you will receive an invoice from [facility]. Please contact [facility] at [phone] with questions or concerns regarding your invoice.   IF you received labwork today, you will receive an invoice from [facility]. Please contact [facility] at [phone] with questions or concerns regarding your invoice.   Our billing staff will not be able to assist you with questions regarding bills from these companies.  You will be contacted with the lab results as soon as they are available. The fastest way to get your results is to activate your My Chart account. Instructions are located on the last page of this paperwork. If you have not heard from [place] regarding the results in 2 weeks, please contact this office.      Calcium Intake Recommendations Calcium is a mineral that affects many functions in the body, including:  Blood clotting.  Blood vessel function.  Nerve impulse conduction.  Hormone secretion.  Muscle contraction.  Bone and teeth functions.  Most of your body's calcium supply is stored in your bones and teeth. When your calcium stores are low, you may be at risk for low bone mass, bone loss, and bone fractures. Consuming enough calcium helps to grow healthy bones and teeth and to prevent breakdown over time. It is very important that you get enough calcium if you are:  A child undergoing rapid growth.  An adolescent girl.  A pre- or post-menopausal woman.  A woman whose menstrual cycle has stopped due to anorexia nervosa or regular intense exercise.  An individual with lactose intolerance or a milk allergy.  A vegetarian.  What is my plan? Try to consume the recommended amount of calcium daily  based on your age. Depending on your overall health, your health care provider may recommend increased calcium intake.General daily calcium intake recommendations by age are:  Birth to 6 months: 200 mg.  Infants 7 to 12 months: 260 mg.  Children 1 to 3 years: 700 mg.  Children 4 to 8 years: 1,000 mg.  Children 9 to 13 years: 1,300 mg.  Teens 14 to 18 years: 1,300 mg.  Adults 19 to 50 years: 1,000 mg.  Adult women 51 to 70 years: 1,200 mg.  Adult men 51 to 70 years: 1,000 mg.  Adults 71 years and older: 1,200 mg.  Pregnant and breastfeeding teens: 1,300 mg.  Pregnant and breastfeeding adults: 1,000 mg.  What do I need to know about calcium intake?  In order for the body to absorb calcium, it needs vitamin D. You can get vitamin D through: ? Direct exposure of the skin to sunlight. ? Foods, such as egg yolks, liver, saltwater fish, and fortified milk. ? Supplements.  Consuming too much calcium may cause: ? Constipation. ? Decreased absorption of iron and zinc. ? Kidney stones.  Calcium supplements may interact with certain medicines. Check with your health care provider before starting any calcium supplements.  Try to get most of your calcium from food. What foods can I eat? Grains  Fortified oatmeal. Fortified ready-to-eat cereals. Fortified frozen waffles. Vegetables Turnip greens. Broccoli. Fruits Fortified orange juice. Meats and Other Protein Sources Canned sardines with bones. Canned salmon with bones. Soy beans. Tofu.  Baked beans. Almonds. [place] nuts. Sunflower seeds. Dairy Milk. Yogurt. Cheese. Cottage cheese. Beverages Fortified soy milk. Fortified rice milk. Sweets/Desserts Pudding. Ice Cream. Milkshakes. Blackstrap molasses. The items listed above may not be a complete list of recommended foods or beverages. Contact your dietitian for more options. What foods can affect my calcium intake? It may be more difficult for your body to use calcium or  calcium may leave your body more quickly if you consume large amounts of:  Sodium.  Protein.  Caffeine.  Alcohol.  This information is not intended to replace advice given to you by your health care provider. Make sure you discuss any questions you have with your health care provider. Document Released: 08/12/2003 Document Revised: 07/18/2015 Document Reviewed: 06/05/2013 Elsevier Interactive Patient Education  2018 [place] protect organs, store calcium, and anchor muscles. Good health habits, such as eating nutritious foods and exercising regularly, are important for maintaining healthy bones. They can also help to prevent a condition that causes bones to lose density and become weak and brittle (osteoporosis). Why is bone mass important? Bone mass refers to the amount of bone tissue that you have. The higher your bone mass, the stronger your bones. An important step toward having healthy bones throughout life is to have strong and dense bones during childhood. A young adult who has a high bone mass is more likely to have a high bone mass later in life. Bone mass at its greatest it is called peak bone mass. A large decline in bone mass occurs in older adults. In women, it occurs about the time of menopause. During this time, it is important to practice good health habits, because if more bone is lost than what is replaced, the bones will become less healthy and more likely to break (fracture). If you find that you have a low bone mass, you may be able to prevent osteoporosis or further bone loss by changing your diet and lifestyle. How can I find out if my bone mass is low? Bone mass can be measured with an X-ray test that is called a bone mineral density (BMD) test. This test is recommended for all women who are age 20 or older. It may also be recommended for men who are age 22 or older, or for people who are more likely to develop osteoporosis due to:  Having bones  that break easily.  Having a long-term disease that weakens bones, such as kidney disease or rheumatoid arthritis.  Having menopause earlier than normal.  Taking medicine that weakens bones, such as steroids, thyroid hormones, or hormone treatment for breast cancer or prostate cancer.  Smoking.  Drinking three or more alcoholic drinks each day.  What are the nutritional recommendations for healthy bones? To have healthy bones, you need to get enough of the right minerals and vitamins. Most nutrition experts recommend getting these nutrients from the foods that you eat. Nutritional recommendations vary from person to person. Ask your health care provider what is healthy for you. Here are some general guidelines. Calcium Recommendations Calcium is the most important (essential) mineral for bone health. Most people can get enough calcium from their diet, but supplements may be recommended for people who are at risk for osteoporosis. Good sources of calcium include:  Dairy products, such as low-fat or nonfat milk, cheese, and yogurt.  Dark green leafy vegetables, such as bok choy and broccoli.  Calcium-fortified foods, such as orange juice, cereal, bread, soy beverages, and tofu products.  Nuts, such as almonds.  Follow these recommended amounts for daily calcium intake:  Children, age 15?3: 700 mg.  Children, age 16?8: 1,000 mg.  Children, age 7?13: 1,300 mg.  Teens, age 64?18: 1,300 mg.  Adults, age 26?50: 1,000 mg.  Adults, age 59?70: ? Men: 1,000 mg. ? Women: 1,200 mg.  Adults, age 57 or older: 1,200 mg.  Pregnant and breastfeeding females: ? Teens: 1,300 mg. ? Adults: 1,000 mg.  Vitamin D Recommendations Vitamin D is the most essential vitamin for bone health. It helps the body to absorb calcium. Sunlight stimulates the skin to make vitamin D, so be sure to get enough sunlight. If you live in a cold climate or you do not get outside often, your health care provider may  recommend that you take vitamin D supplements. Good sources of vitamin D in your diet include:  Egg yolks.  Saltwater fish.  Milk and cereal fortified with vitamin D.  Follow these recommended amounts for daily vitamin D intake:  Children and teens, age 157?18: 73 international units.  Adults, age 166 or younger: 400-800 international units.  Adults, age 24 or older: 800-1,000 international units.  Other Nutrients Other nutrients for bone health include:  Phosphorus. This mineral is found in meat, poultry, dairy foods, nuts, and legumes. The recommended daily intake for adult men and adult women is 700 mg.  Magnesium. This mineral is found in seeds, nuts, dark green vegetables, and legumes. The recommended daily intake for adult men is 400?420 mg. For adult women, it is 310?320 mg.  Vitamin K. This vitamin is found in green leafy vegetables. The recommended daily intake is 120 mg for adult men and 90 mg for adult women.  What type of physical activity is best for building and maintaining healthy bones? Weight-bearing and strength-building activities are important for building and maintaining peak bone mass. Weight-bearing activities cause muscles and bones to work against gravity. Strength-building activities increases muscle strength that supports bones. Weight-bearing and muscle-building activities include:  Walking and hiking.  Jogging and running.  Dancing.  Gym exercises.  Lifting weights.  Tennis and racquetball.  Climbing stairs.  Aerobics.  Adults should get at least 30 minutes of moderate physical activity on most days. Children should get at least 60 minutes of moderate physical activity on most days. Ask your health care provide what type of exercise is best for you. Where can I find more information? For more information, check out the following websites:  [place]: YardHomes.se  [employer] of Health:  http://www.niams.AnonymousEar.fr.asp  This information is not intended to replace advice given to you by your health care provider. Make sure you discuss any questions you have with your health care provider. Document Released: 03/20/2003 Document Revised: 07/18/2015 Document Reviewed: 01/02/2014 Elsevier Interactive Patient Education  2018 [employer].  Preventing Osteoporosis, Adult Osteoporosis is a condition that causes the bones to get weaker. With osteoporosis, the bones become thinner, and the normal spaces in bone tissue become larger. This can make the bones weak and cause them to break more easily. People who have osteoporosis are more likely to break their wrist, spine, or hip. Even a minor accident or injury can be enough to break weak bones. Osteoporosis can occur with aging. Your body constantly replaces old bone tissue with new tissue. As you get older, you may lose bone tissue more quickly, or it may be replaced more slowly. Osteoporosis is more likely to develop if you have poor nutrition or do not  get enough calcium or vitamin D. Other lifestyle factors can also play a role. By making some diet and lifestyle changes, you can help to keep your bones healthy and help to prevent osteoporosis. What nutrition changes can be made? Nutrition plays an important role in maintaining healthy, strong bones.  Make sure you get enough calcium every day from food or from calcium supplements. ? If you are age 110 or younger, aim to get 1,000 mg of calcium every day. ? If you are older than age 10, aim to get 1,200 mg of calcium every day.  Try to get enough vitamin D every day. ? If you are age 85 or younger, aim to get 600 international units (IU) every day. ? If you are older than age 33, aim to get 800 international units every day.  Follow a healthy diet. Eat plenty of foods that contain calcium and vitamin D. ? Calcium is in milk, cheese, yogurt,  and other dairy products. Some fish and vegetables are also good sources of calcium. Many foods such as cereals and breads have had calcium added to them (are fortified). Check nutrition labels to see how much calcium is in a food or drink. ? Foods that contain vitamin D include milk, cereals, salmon, and tuna. Your body also makes vitamin D when you are out in the sun. Bare skin exposure to the sun on your face, arms, legs, or back for no more than 30 minutes a day, 2 times per week is more than enough. Beyond that, it is important to use sunblock to protect your skin from sunburn, which increases your risk for skin cancer.  What lifestyle changes can be made? Making changes in your everyday life can also play an important role in preventing osteoporosis.  Stay active and get exercise every day. Ask your health care provider what types of exercise are best for you.  Do not use any products that contain nicotine or tobacco, such as cigarettes and e-cigarettes. If you need help quitting, ask your health care provider.  Limit alcohol intake to no more than 1 drink a day for nonpregnant women and 2 drinks a day for men. One drink equals 12 oz of beer, 5 oz of wine, or 1 oz of hard liquor.  Why are these changes important? Making these nutrition and lifestyle changes can:  Help you develop and maintain healthy, strong bones.  Prevent loss of bone mass and the problems that are caused by that loss, such as broken bones and delayed healing.  Make you feel better mentally and physically.  What can happen if changes are not made? Problems that can result from osteoporosis can be very serious. These may include:  A higher risk of broken bones that are painful and do not heal well.  Physical malformations, such as a collapsed spine or a hunched back.  Problems with movement.  Where to find support: If you need help making changes to prevent osteoporosis, talk with your health care provider. You  can ask for a referral to a diet and nutrition specialist (dietitian) and a physical therapist. Where to find more information: Learn more about osteoporosis from:  NIH Osteoporosis and Related [place]: www.niams.GolfingGoddess.com.br  U.S. Office on Women's Health: SouvenirBaseball.es.html  National Osteoporosis Foundation: ProfilePeek.ch  Summary  Osteoporosis is a condition that causes weak bones that are more likely to break.  Eating a healthy diet and making sure you get enough calcium and vitamin D can help  prevent osteoporosis.  Other ways to reduce your risk of osteoporosis include getting regular exercise and avoiding alcohol and products that contain nicotine or tobacco. This information is not intended to replace advice given to you by your health care provider. Make sure you discuss any questions you have with your health care provider. Document Released: 01/12/2015 Document Revised: 09/08/2015 Document Reviewed: 09/08/2015 Elsevier Interactive Patient Education  [employer].

## 2017-11-21 NOTE — Progress Notes (Signed)
Subjective:   [name] is a 67 y.o. female who presents for Medicare Annual (Subsequent) preventive examination.  Review of Systems:  1.Obesity 2. HTN: Dr. Johnsie Cancel recently changed her from lisinopril-hctz 20-25 to lisinopril 40 and lasix 20 due to occ lower ext edema. BP has been good and edema sig improved.  When she checks her BP at home it was 125 last wk. She will start checking her BP outside of the office weekly. Edema is now resolved with current fluid pill regimen 3. HLD: Elev trig at 269 last yr, LDL 90, non-hdl 144. On lipitor 40, niacin 560m qam, and fish oil 4. OSA: On cpap and tolerating well.  Using religiously Is followed by Dr. DRoxy Horsemanannually.  5. h/o DVT in 2008 - on Xarelto followed by Dr. NJohnsie Cancelannually 6. H/o IDA - had her stop iron supp last yr and still off - were borderline low at last check.   H/o vit D def - Has been taking vit D otc 2000/d for the past yr as her level drop when she went off of it. Does get some calcium in her diet and then once a day calcium supp 600u w/ vitamin D.  Has had a mobility issues with many evaluations and treatment - is wearing orthotic shoes and shots in her knees - don't know what to do next - maybe a chiropractor - LBP and can't stand for a long time and myalgias. Would like to go to PT for swimming and land, her prior PT is NLexine Batonand does not have pool exercises. and has been referred to Dr. DEstanislado Pandy Going [place] [employer]. Everything hurts. She gets euflexxa into both her knees every 6 months for several years. Seeing her for 4 yrs for Sees Dr. DNicolette Bangagain Friday.  Keeps a high wbc back to 2005 when she had cancer. Had short term chemo - they do CA=-125 annually but has not had [place] Had ovarian cancer in 1 ovary but had both out. Bursitis/arthritis: Hips, knees, feet, low back. But upper body is good.  Seeing Dr. DEstanislado Pandywho referred her back to PT recently though it hasn't helped more prior- the woman  who was in charge at [employer]  States she can't walk and has had numerous referrals and noone can figure out why she can't walk. She has thought that perhaps going back to PT might help with the muscle weakness and hip bursitis.  She connects this back to muscle loss when she was bedridden after the DVT.  She had several visits in [facility] PT and goes 3d/wk to aquatic center. Then she want to another one that just made her go to through her home exercises everytime, had trouble getting in and out of the pool, and bruised her.  As doesn't know whether the orthopedic shoes are helping. She thinks her back muscles are weak from the lower extremity weakness.  Muscle weakness of lower extremity - already done pool PT twice and continues to do exercises in pool 3x/wk w/ minimal improvement. Had sig improvement when she had another issue working with PT [facility]ine [facility]aton[facility] [employer] [employer] so will refer back to work with NLexine Batonagain.  Pt considering DO or chiropractor but encouraged her to try PT first then re-eval.   13. Weakness of low back due to inactivity   dependent upon cane. Attributes to the muscle wasting she had when bedridden after DVT in 2008 (10 yrs ago).  Did she repat dexa  at [facility] since last yr which had worsened but still on osteopenia scale Dr. Jacelyn Grip for cortisone in hip and then get Dr. Estanislado Pandy and then get back to [facility] to restart PT. Still doing water exercises 3x/wk.   Post-nasal drip: takes cetirizine daily but still having a lot of sxs. No nasal sprays. Keeps zyrtec on all the time. Having year round PND at certain times of the year it does get worse with allergies/hay fever symptoms.   No GERD  Stomach pain resolved. Good x 3 wks but prior did have little episodes of diarrhea without explanation.   Cough from lisinopril improved. Has tried other BP meds and was unable to tolerate them. Does not want to change her med.  Leg swelling around her ankles - Had a  h/o DVT in 2008 in left leg. Typical lipedema pattern with feet sparing. Thinks that poss muscle atropathy might be exacerbating. Overall the swelling is doing better.         Objective:     Vitals: BP (!) 144/76   Pulse 84   Temp 98 F (36.7 C) (Oral)   Resp 16   Wt 298 lb (135.2 kg)   LMP 01/12/2003   SpO2 94%   BMI 46.67 kg/m   Body mass index is 46.67 kg/m.  Advanced Directives 11/21/2017 11/18/2016 10/16/2015 05/21/2014  Does Patient Have a Medical Advance Directive? Yes Yes Yes Yes  Type of Paramedic of [place];Living will [place];Living will Living will;Healthcare Power of [place];Living will  Copy of [place] in Chart? Yes - validated most recent copy scanned in chart (See row information) Yes No - copy requested Yes  HCPOA is either [place] or [place] - brother or sister in low   Tobacco Social History   Tobacco Use  Smoking Status Never Smoker  Smokeless Tobacco Never Used  Tobacco Comment   only for 6 months in the 70s     Counseling given: Not Answered Comment: only for 6 months in the 70s   Clinical Intake:  Past Medical History:  Diagnosis Date  . Anemia    oral iron occ.  . Arthritis   . Basal cell carcinoma of nose   . Bronchitis   . 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 Degenerative joint disease of both hips 2019  . Diverticulitis    past history  . DVT (deep vein thrombosis) in pregnancy    LEFT LEG  . Family history of breast cancer   . Femoral DVT (deep venous thrombosis) ([place]) 2008   -"wears compression hose all times" left leg  . FH: genetic disease carrier 11/05/2016  . Genetic testing 11/04/2016   Multi-Cancer panel (83 genes) @ [facility] - see report; special interpretation  . History of ovarian cancer   . HTN (hypertension)   . Hyperlipidemia   . OSA (obstructive sleep apnea)    hypoventilation, on CPAP  .  Osteopenia 2019  . Shingles    Past Surgical History:  Procedure Laterality Date  . BASAL CELL CARCINOMA EXCISION     resection with plastic surgery reconstrustion  . bilateral breast mass  1970   benigned, left breast  . cataract surgery Bilateral [id number], 2005   with bilateral lens replacements  . COLONOSCOPY WITH PROPOFOL N/A 05/21/2014   Procedure: COLONOSCOPY WITH PROPOFOL;  Surgeon: Juanita Craver, MD;  Location: [facility];  Service: Endoscopy;  Laterality: N/A;  . HERNIA REPAIR  7169   umbilical repair with mesh   . TOTAL ABDOMINAL HYSTERECTOMY W/ BILATERAL SALPINGOOPHORECTOMY  2005   Stage IA ovarian cancer   Family History  Problem Relation Age of Onset  . Alzheimer's disease Mother   . Hypertension Mother   . Alzheimer's disease Father   . Hypertension Father   . Colon cancer Father 34       deceased 70  . Hypertension Sister   . Breast cancer Sister 75  . Hypertension Brother   . Prostate cancer Brother 68  . Prostate cancer Other        father's maternal half-brother  . Breast cancer Other 67       daughter of brother; currently 62   Social History   Socioeconomic History  . Marital status: Single    Spouse name: Not on file  . Number of children: 0  . Years of education: masters  . Highest education level: Master's degree (e.g., MA, MS, MEng, MEd, MSW, MBA)  Occupational History  . 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 Financial resource strain: Not hard at all  . Food insecurity:    Worry: Never true    Inability: Never true  . Transportation needs:    Medical: No    Non-medical: No  Tobacco Use  . Smoking status: Never Smoker  . Smokeless tobacco: Never Used  . Tobacco comment: only for 6 months in the 70s  Substance and Sexual Activity  . Alcohol use: Yes    Alcohol/week: 0.0 standard drinks    Comment: rare, 2 yearly  . Drug use: Never  . Sexual activity: Never      Partners: Female    Birth control/protection: Surgical  Lifestyle  . Physical activity:    Days per week: 3 days    Minutes per session: 60 min  . Stress: Not at all  Relationships  . Social connections:    Talks on phone: More than three times a week    Gets together: More than three times a week    Attends religious service: Never    Active member of club or organization: Yes    Attends meetings of clubs or organizations: 1 to 4 times per year    Relationship status: Never married  Other Topics Concern  . 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 PSH showed an AHI of 120!!!! titrated to 8 cm watr cPAP , residual AHI of 1 and downlaod was done  01-23-11 .  excellent compliance - 7 hours and low leak,  nasal pillow mask.    BMI is considered morbidly obese.  discussed low carb  diet - weight watchers and restricted exercise. , aquatic .    Patient cannot exercise due to soreness in proximal muscles.and  would like to add coenzyme q 10 and carnitine . She needs a medical weight loss regimen - bariatric surgery?       FSS 48, Epworth 4 again ,  CMS compliant  residual AHI 1.1 and average use 6.48  hours , no naps    Patient is single and lives alone.   Patient does not have any children.   Patient is retired.   Patient has a Master's degree.   Patient is left-handed.   Patient drinks tea occasionally.  Outpatient Encounter Medications as of 11/21/2017  Medication Sig  . ACETAMINOPHEN PO Take 1,300 mg by mouth 3 (three) times daily.   Marland Kitchen atorvastatin (LIPITOR) 40 MG tablet Take 1 tablet (40 mg total) by mouth daily.  . Calcium Carb-Cholecalciferol (CALCIUM 600 + D PO) Take 1 tablet daily by mouth.  . EUFLEXXA 20 MG/2ML SOSY   . fish oil-omega-3 fatty acids 1000 MG capsule Take 1-2 g by mouth 2 (two) times daily.   . fluticasone (FLONASE) 50 MCG/ACT nasal spray USE 2 SPRAYS IN EACH NOSTRIL AT BEDTIME  .  furosemide (LASIX) 20 MG tablet Take 1 tablet (20 mg total) by mouth daily.  Marland Kitchen lisinopril (PRINIVIL,ZESTRIL) 40 MG tablet Take 1 tablet (40 mg total) by mouth daily.  . Multiple Vitamin (MULTIVITAMIN) tablet Take 1 tablet by mouth every morning.   . niacin 500 MG tablet Take 500 mg by mouth every evening.   . rivaroxaban (XARELTO) 20 MG TABS tablet Take 1 tablet (20 mg total) by mouth daily.  . [DISCONTINUED] cyclobenzaprine (FLEXERIL) 5 MG tablet TAKE 1 TO 2 TABLETS BY MOUTH DAILY AT BEDTIME AS NEEDED FOR SPASMS   No facility-administered encounter medications on file as of 11/21/2017.     Activities of Daily Living In your present state of health, do you have any difficulty performing the following activities: 11/21/2017  Hearing? N  Vision? N  Difficulty concentrating or making decisions? N  Walking or climbing stairs? Y  Dressing or bathing? N  Doing errands, shopping? N  Some recent data might be hidden    Patient Care Team: Shawnee Knapp, MD as PCP - General (Family Medicine) Josue Hector, MD as PCP - Cardiology (Cardiology) Dohmeier, Asencion Partridge, MD as Consulting Physician (Neurology) Josue Hector, MD as Consulting Physician (Cardiology) Bo Merino, MD as Consulting Physician (Rheumatology) Bettina Gavia, [place] (Optometry) Megan Salon, MD as Consulting Physician (Gynecology) Juanita Craver, MD as Consulting Physician (Gastroenterology)    Assessment:   This is a routine wellness examination for [name].  Exercise Activities and Dietary recommendations    Goals    . Increase mobility     Patient states that she would like to increase her mobility in the near future.        Fall Risk Fall Risk  11/21/2017 03/11/2017 03/04/2017 03/01/2017 02/17/2017  Falls in the past year? 0 No No No No  Number falls in past yr: 0 - - - -  Injury with Fall? 0 - - - -   Is the patient's home free of loose throw rugs in walkways, pet beds, electrical cords, etc?   yes       Grab bars in the bathroom? no      Handrails on the stairs?   no      Adequate lighting?   yes  Timed Get Up and Go performed: normal  Depression Screen PHQ 2/9 Scores 11/21/2017 03/11/2017 03/04/2017 03/01/2017  PHQ - 2 Score 0 0 0 0     Cognitive Function     6CIT Screen 11/21/2017 11/18/2016  What Year? 0 points 0 points  What month? 0 points 0 points  What time? 0 points 0 points  Count back from 20 0 points 0 points  Months in reverse 0 points 0 points  Repeat phrase 0 points 0 points  Total Score 0 0    Immunization History  Administered Date(s) Administered  . Influenza,inj,Quad PF,6+ Mos 09/18/2013, 10/10/2014, 10/16/2015, 11/18/2016  . Pneumococcal Conjugate-13 09/18/2013  .  Pneumococcal Polysaccharide-23 10/10/2014  . Td 07/13/2014  . Zoster Recombinat (Shingrix) 05/26/2017, 08/27/2017    Qualifies for Shingles Vaccine?DONE  Screening Tests Health Maintenance  Topic Date Due  . COLONOSCOPY  05/21/2019  . MAMMOGRAM  09/02/2019  . TETANUS/TDAP  07/12/2024  . INFLUENZA VACCINE  Completed  . DEXA SCAN  Completed  . Hepatitis C Screening  Completed  . PNA vac Low Risk Adult  Completed    Cancer Screenings: Lung: Low Dose CT Chest recommended if Age 22-80 years, 30 pack-year currently smoking OR have quit w/in 15years. Patient does not qualify. Breast:  Up to date on Mammogram? Yes  09/01/17 Up to date of Bone Density/Dexa? Yes 09/09/17 osteopenia radius -1.9 - prior DEXA 2014 was normal with hips -0.7 but can't use hips any longer due to arthritis. L spine with + T scores likely also due to arthritis.  Colorectal: Dr. Collene Mares 05/21/14 q5 yrs so repeat in 2021  Additional Screenings:: Hepatitis C Screening: done    EKG last done 11/15/17 - sees cardiology annually Plan:   Forward labs to Amie Portland with Johnsie Cancel at cardiology as Juluis Rainier  I have personally reviewed and noted the following in the patient's chart:   . Medical and social history . Use of alcohol,  tobacco or illicit drugs  . Current medications and supplements . Functional ability and status . Nutritional status . Physical activity . Advanced directives . List of other physicians . Hospitalizations, surgeries, and ER visits in previous 12 months . Vitals . Screenings to include cognitive, depression, and falls . Referrals and appointments  In addition, I have reviewed and discussed with patient certain preventive protocols, quality metrics, and best practice recommendations. A written personalized care plan for preventive services as well as general preventive health recommendations were provided to patient.     Shawnee Knapp, MD  11/21/2017

## 2017-11-22 LAB — CBC WITH DIFFERENTIAL/PLATELET
BASOS: 0 %
Basophils Absolute: 0 10*3/uL (ref 0.0–0.2)
EOS (ABSOLUTE): 0 10*3/uL (ref 0.0–0.4)
Eos: 1 %
Hematocrit: 42.2 % (ref 34.0–46.6)
Hemoglobin: 13.9 g/dL (ref 11.1–15.9)
IMMATURE GRANS (ABS): 0 10*3/uL (ref 0.0–0.1)
IMMATURE GRANULOCYTES: 0 %
LYMPHS: 29 %
Lymphocytes Absolute: 1.3 10*3/uL (ref 0.7–3.1)
MCH: 29.7 pg (ref 26.6–33.0)
MCHC: 32.9 g/dL (ref 31.5–35.7)
MCV: 90 fL (ref 79–97)
MONOCYTES: 12 %
Monocytes Absolute: 0.5 10*3/uL (ref 0.1–0.9)
NEUTROS PCT: 58 %
Neutrophils Absolute: 2.6 10*3/uL (ref 1.4–7.0)
PLATELETS: 176 10*3/uL (ref 150–450)
RBC: 4.68 x10E6/uL (ref 3.77–5.28)
RDW: 13.1 % (ref 12.3–15.4)
WBC: 4.5 10*3/uL (ref 3.4–10.8)

## 2017-11-22 LAB — COMPREHENSIVE METABOLIC PANEL
ALT: 22 IU/L (ref 0–32)
AST: 26 IU/L (ref 0–40)
Albumin/Globulin Ratio: 2.3 — ABNORMAL HIGH (ref 1.2–2.2)
Albumin: 5.1 g/dL — ABNORMAL HIGH (ref 3.6–4.8)
Alkaline Phosphatase: 101 IU/L (ref 39–117)
BUN/Creatinine Ratio: 17 (ref 12–28)
BUN: 14 mg/dL (ref 8–27)
Bilirubin Total: 0.7 mg/dL (ref 0.0–1.2)
CALCIUM: 10.7 mg/dL — AB (ref 8.7–10.3)
CO2: 22 mmol/L (ref 20–29)
CREATININE: 0.81 mg/dL (ref 0.57–1.00)
Chloride: 102 mmol/L (ref 96–106)
GFR calc Af Amer: 86 mL/min/{1.73_m2} (ref >59)
GFR, EST NON AFRICAN AMERICAN: 74 mL/min/{1.73_m2} (ref >59)
Globulin, Total: 2.2 g/dL (ref 1.5–4.5)
Glucose: 94 mg/dL (ref 65–99)
Potassium: 4.1 mmol/L (ref 3.5–5.2)
Sodium: 144 mmol/L (ref 134–144)
Total Protein: 7.3 g/dL (ref 6.0–8.5)

## 2017-11-22 LAB — LIPID PANEL
Chol/HDL Ratio: 4.2 ratio (ref 0.0–4.4)
Cholesterol, Total: 204 mg/dL — ABNORMAL HIGH (ref 100–199)
HDL: 49 mg/dL (ref >39)
LDL CALC: 99 mg/dL (ref 0–99)
Triglycerides: 280 mg/dL — ABNORMAL HIGH (ref 0–149)
VLDL Cholesterol Cal: 56 mg/dL — ABNORMAL HIGH (ref 5–40)

## 2017-11-22 LAB — IRON,TIBC AND FERRITIN PANEL
Ferritin: 1003 ng/mL — ABNORMAL HIGH (ref 15–150)
IRON: 56 ug/dL (ref 27–139)
Iron Saturation: 17 % (ref 15–55)
Total Iron Binding Capacity: 334 ug/dL (ref 250–450)
UIBC: 278 ug/dL (ref 118–369)

## 2017-11-22 LAB — VITAMIN D 25 HYDROXY (VIT D DEFICIENCY, FRACTURES): Vit D, 25-Hydroxy: 31.8 ng/mL (ref 30.0–100.0)

## 2017-11-25 ENCOUNTER — Ambulatory Visit (INDEPENDENT_AMBULATORY_CARE_PROVIDER_SITE_OTHER): Payer: Medicare Other | Admitting: Physician Assistant

## 2017-11-25 DIAGNOSIS — M170 Bilateral primary osteoarthritis of knee: Secondary | ICD-10-CM | POA: Diagnosis not present

## 2017-11-25 MED ORDER — SODIUM HYALURONATE (VISCOSUP) 20 MG/2ML IX SOSY
20.0000 mg | PREFILLED_SYRINGE | INTRA_ARTICULAR | Status: AC | PRN
  Administered 2017-11-25: 20 mg via INTRA_ARTICULAR

## 2017-11-25 MED ORDER — LIDOCAINE HCL 1 % IJ SOLN
1.5000 mL | INTRAMUSCULAR | Status: AC | PRN
  Administered 2017-11-25: 1.5 mL

## 2017-11-25 NOTE — Progress Notes (Signed)
   Procedure Note  Patient: [name]             Date of Birth: 09-05-1950           MRN: [id number]             Visit Date: 11/25/2017  Procedures: Visit Diagnoses: Primary osteoarthritis of both knees - Plan: Large Joint Inj: bilateral knee Euflexxa #1 bilateral P/P Large Joint Inj: bilateral knee on 11/25/2017 11:15 AM Indications: pain Details: 27 G 1.5 in needle, medial approach  Arthrogram: No  Medications (Right): 20 mg Sodium Hyaluronate 20 MG/2ML; 1.5 mL lidocaine 1 % Aspirate (Right): 0 mL Medications (Left): 20 mg Sodium Hyaluronate 20 MG/2ML; 1.5 mL lidocaine 1 % Aspirate (Left): 0 mL Outcome: tolerated well, no immediate complications Procedure, treatment alternatives, risks and benefits explained, specific risks discussed. Consent was given by the patient. Immediately prior to procedure a time out was called to verify the correct patient, procedure, equipment, support staff and site/side marked as required. Patient was prepped and draped in the usual sterile fashion.    Patient tolerated the procedure well.  Hazel Sams, PA-C

## 2017-12-02 ENCOUNTER — Ambulatory Visit (INDEPENDENT_AMBULATORY_CARE_PROVIDER_SITE_OTHER): Payer: Medicare Other | Admitting: Physician Assistant

## 2017-12-02 DIAGNOSIS — M170 Bilateral primary osteoarthritis of knee: Secondary | ICD-10-CM | POA: Diagnosis not present

## 2017-12-02 MED ORDER — SODIUM HYALURONATE (VISCOSUP) 20 MG/2ML IX SOSY
20.0000 mg | PREFILLED_SYRINGE | INTRA_ARTICULAR | Status: AC | PRN
  Administered 2017-12-02: 20 mg via INTRA_ARTICULAR

## 2017-12-02 MED ORDER — LIDOCAINE HCL 1 % IJ SOLN
1.5000 mL | INTRAMUSCULAR | Status: AC | PRN
  Administered 2017-12-02: 1.5 mL

## 2017-12-02 NOTE — Progress Notes (Signed)
   Procedure Note  Patient: [name]             Date of Birth: [birth year]           MRN: [id number]             Visit Date: 12/02/2017  Procedures: Visit Diagnoses: Primary osteoarthritis of both knees - Plan: Large Joint Inj: bilateral knee euflexxa #2 bilateral P/P Large Joint Inj: bilateral knee on 12/02/2017 10:57 AM Indications: pain Details: 27 G 1.5 in needle, medial approach  Arthrogram: No  Medications (Right): 20 mg Sodium Hyaluronate 20 MG/2ML; 1.5 mL lidocaine 1 % Aspirate (Right): 0 mL Medications (Left): 20 mg Sodium Hyaluronate 20 MG/2ML; 1.5 mL lidocaine 1 % Aspirate (Left): 0 mL Outcome: tolerated well, no immediate complications Procedure, treatment alternatives, risks and benefits explained, specific risks discussed. Consent was given by the patient. Immediately prior to procedure a time out was called to verify the correct patient, procedure, equipment, support staff and site/side marked as required. Patient was prepped and draped in the usual sterile fashion.      Patient tolerated the procedure well.  Hazel Sams, PA-C

## 2017-12-05 ENCOUNTER — Other Ambulatory Visit: Payer: Self-pay

## 2017-12-05 ENCOUNTER — Encounter: Payer: Self-pay | Admitting: Obstetrics & Gynecology

## 2017-12-05 ENCOUNTER — Ambulatory Visit: Payer: Medicare Other | Admitting: Obstetrics & Gynecology

## 2017-12-05 VITALS — BP 142/82 | HR 96 | Resp 18 | Ht 65.5 in | Wt 299.2 lb

## 2017-12-05 DIAGNOSIS — Z01419 Encounter for gynecological examination (general) (routine) without abnormal findings: Secondary | ICD-10-CM

## 2017-12-05 DIAGNOSIS — C569 Malignant neoplasm of unspecified ovary: Secondary | ICD-10-CM

## 2017-12-05 NOTE — Progress Notes (Addendum)
67 y.o. G0P0 Single White or [name] female here for annual exam.  Doing well.  Exercising regularly three times weekly.    Denies vaginal bleeding.    We discussed last last having yearly MRI, alternating with her MMG.  This was not initiated as she proceeded with genetic testing first.    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   However her particular variant is not commonly seen with the hereditary syndrome.  No particular recommendations were made except for family members considering pregnancy.  Patient's last menstrual period was 01/12/2003.          Sexually active: No.  The current method of family planning is status post hysterectomy.    Exercising: Yes.    water aerobics 3 x weekly  Smoker:  no  Health Maintenance: Pap:  2005 normal  History of abnormal Pap:  yes MMG:  09/01/17 BIRADS2:Benign  Colonoscopy:  05/21/14 polyps. F/u 5 years  BMD:   09/01/17 Osteopenia  TDaP:  2016 Pneumonia vaccine(s):  2016 Shingrix: completed  Hep C testing: 10/10/14 Neg  Screening Labs: CA 125 today    reports that she has never smoked. She has never used smokeless tobacco. She reports that she drinks alcohol. She reports that she does not use drugs.  Past Medical History:  Diagnosis Date  . Anemia    oral iron occ.  . Arthritis   . Basal cell carcinoma of nose   . Bronchitis   . Cancer of ovary ([place]) 2005   Stage 1A, BRCA 1/2 neg 6/14  . Degenerative joint disease of both hips 2019  . Diverticulitis    past history  . DVT (deep vein thrombosis) in pregnancy    LEFT LEG  . Family history of breast cancer   . Femoral DVT (deep venous thrombosis) ([place]) 2008   -"wears compression hose all times" left leg  . FH: genetic disease carrier 11/05/2016  . Genetic testing 11/04/2016   Multi-Cancer panel (83 genes) @ [facility] - see report; special interpretation  . History of ovarian cancer   . HTN (hypertension)   . Hyperlipidemia   . OSA (obstructive sleep apnea)    hypoventilation, on CPAP  . Osteopenia 2019  . Shingles     Past Surgical History:  Procedure Laterality Date  . BASAL CELL CARCINOMA EXCISION     resection with plastic surgery reconstrustion  . bilateral breast mass  1970   benigned, left breast  . cataract surgery Bilateral [id number], 2005   with bilateral lens replacements  . COLONOSCOPY WITH PROPOFOL N/A 05/21/2014   Procedure: COLONOSCOPY WITH PROPOFOL;  Surgeon: Juanita Craver, MD;  [facility]: [facility];  Service: Endoscopy;  Laterality: N/A;  . HERNIA REPAIR  8502   umbilical repair with mesh   . TOTAL ABDOMINAL HYSTERECTOMY W/ BILATERAL SALPINGOOPHORECTOMY  2005   Stage IA ovarian cancer    Current Outpatient Medications  Medication Sig Dispense Refill  . ACETAMINOPHEN PO Take 1,300 mg by mouth 3 (three) times daily.     Marland Kitchen atorvastatin (LIPITOR) 40 MG tablet Take 1 tablet (40 mg total) by mouth daily. 90 tablet 3  . Calcium Carb-Cholecalciferol (CALCIUM 600 + D PO) Take 1 tablet daily by mouth.    . Cholecalciferol (VITAMIN D3) 50 MCG (2000 UT) capsule daily.    . EUFLEXXA 20 MG/2ML SOSY     .  fish oil-omega-3 fatty acids 1000 MG capsule Take 1-2 g by mouth 2 (two) times daily.     . fluticasone (FLONASE) 50 MCG/ACT nasal spray Place 2 sprays into both nostrils daily. 48 g 3  . furosemide (LASIX) 20 MG tablet Take 1 tablet (20 mg total) by mouth daily. 90 tablet 3  . lisinopril (PRINIVIL,ZESTRIL) 40 MG tablet Take 1 tablet (40 mg total) by mouth daily. 30 tablet 0  . Multiple Vitamin (MULTIVITAMIN) tablet Take 1 tablet by mouth every morning.     . niacin 500 MG tablet Take 500 mg by mouth every evening.     . rivaroxaban (XARELTO) 20 MG TABS tablet Take 1 tablet (20 mg total) by mouth daily. 90 tablet 0   No current  facility-administered medications for this visit.     Family History  Problem Relation Age of Onset  . Alzheimer's disease Mother   . Hypertension Mother   . Alzheimer's disease Father   . Hypertension Father   . Colon cancer Father 61       deceased 27  . Hypertension Sister   . Breast cancer Sister 81  . Hypertension Brother   . Prostate cancer Brother 21  . Prostate cancer Other        father's maternal half-brother  . Breast cancer Other 63       daughter of brother; currently 19    Review of Systems  Musculoskeletal: Positive for myalgias.  Skin: Positive for rash.  All other systems reviewed and are negative.   Exam:   BP (!) 142/82 (BP Location: Left Arm, Patient Position: Sitting, Cuff Size: Large)   Pulse 96   Resp 18   Ht 5' 5.5" (1.664 m)   Wt 299 lb 3.2 oz (135.7 kg)   LMP 01/12/2003   BMI 49.03 kg/m   Height: 5' 5.5" (166.4 cm)  Ht Readings from Last 3 Encounters:  12/05/17 5' 5.5" (1.664 m)  11/15/17 '5\' 7"'  (1.702 m)  09/27/17 '5\' 7"'  (1.702 m)    General appearance: alert, cooperative and appears stated age Head: Normocephalic, without obvious abnormality, atraumatic Neck: no adenopathy, supple, symmetrical, trachea midline and thyroid normal to inspection and palpation Lungs: clear to auscultation bilaterally Breasts: normal appearance, no masses or tenderness Heart: regular rate and rhythm Abdomen: soft, non-tender; bowel sounds normal; no masses,  no organomegaly Extremities: extremities normal, atraumatic, no cyanosis or edema Skin: Skin color, texture, turgor normal. No rashes or lesions Lymph nodes: Cervical, supraclavicular, and axillary nodes normal. No abnormal inguinal nodes palpated Neurologic: Grossly normal   Pelvic: External genitalia:  no lesions              Urethra:  normal appearing urethra with no masses, tenderness or lesions              Bartholins and Skenes: normal                 Vagina: normal appearing vagina with normal  color and discharge, no lesions              Cervix: absent              Pap taken: No. Bimanual Exam:  Uterus:  uterus absent              Adnexa: no mass, fullness, tenderness               Rectovaginal: Confirms  Anus:  normal sphincter tone, no lesions  Chaperone was present for exam.  A:  Well Woman with normal exam H/O ovarian cancer, Stage 1A.  S/p TAH/BSO 5/05 H/o extensive left LE DVT, on Xarelto Obesity but working on weight loss Family hx of breast cancer in niece and sister.  TCR model with 24.7% lifetime risk of breast cancer OSA, on CPAP Skin yeast Elevated ferritin  P:   Mammogram guidelines reviewed.  Yearly breast MRI recommended last year but not done pap smear not indicated Ca-1265 obtained Consider breast MRI 02/2018 Will ask Dr. Marin Olp about whether consult is needed.  (addendum:  He has reviewed her chart and did not recommend consultation at this time.  Pt has been notified of this.) return annually or prn

## 2017-12-05 NOTE — Patient Instructions (Signed)
Lamisil or Lotrimin OTC cream

## 2017-12-06 LAB — CA 125: Cancer Antigen (CA) 125: 6.8 U/mL (ref 0.0–38.1)

## 2017-12-07 ENCOUNTER — Encounter: Payer: Self-pay | Admitting: Obstetrics & Gynecology

## 2017-12-07 ENCOUNTER — Telehealth: Payer: Self-pay | Admitting: Obstetrics & Gynecology

## 2017-12-07 NOTE — Telephone Encounter (Signed)
Message   Dr. Sabra Heck,    Thank you so much for your thorough follow-up, with the hematologist, regarding my Ferritin level. I feel much better knowing that the elevation, is related to my Arthritis.    I hope that you and your family, have a wonderful Thanksgiving!    [name]

## 2017-12-12 ENCOUNTER — Telehealth (INDEPENDENT_AMBULATORY_CARE_PROVIDER_SITE_OTHER): Payer: Self-pay

## 2017-12-12 ENCOUNTER — Ambulatory Visit (INDEPENDENT_AMBULATORY_CARE_PROVIDER_SITE_OTHER): Payer: Medicare Other | Admitting: Physician Assistant

## 2017-12-12 DIAGNOSIS — M170 Bilateral primary osteoarthritis of knee: Secondary | ICD-10-CM | POA: Diagnosis not present

## 2017-12-12 MED ORDER — LIDOCAINE HCL 1 % IJ SOLN
1.5000 mL | INTRAMUSCULAR | Status: AC | PRN
  Administered 2017-12-12: 1.5 mL

## 2017-12-12 MED ORDER — SODIUM HYALURONATE (VISCOSUP) 20 MG/2ML IX SOSY
20.0000 mg | PREFILLED_SYRINGE | INTRA_ARTICULAR | Status: AC | PRN
  Administered 2017-12-12: 20 mg via INTRA_ARTICULAR

## 2017-12-12 NOTE — Telephone Encounter (Signed)
Received a call from [facility] with [facility] and was advised that Euflexxa was delivered on 10/21/2017 to [address].  Patient has received injections.

## 2017-12-12 NOTE — Telephone Encounter (Signed)
Talked with Bri at [facility] to check status of Euflexxa, bilateral knee.  Per Bri, she will call [facility] to check status of Euflexxa injection.

## 2017-12-12 NOTE — Progress Notes (Signed)
   Procedure Note  Patient: [name]             Date of Birth: August 08, 1950           MRN: [id number]             Visit Date: 12/12/2017  Procedures: Visit Diagnoses: Primary osteoarthritis of both knees - Plan: Large Joint Inj: bilateral knee Euflexxa #3 bilateral P/P Large Joint Inj: bilateral knee on 12/12/2017 11:40 AM Indications: pain Details: 27 G 1.5 in needle, medial approach  Arthrogram: No  Medications (Right): 20 mg Sodium Hyaluronate 20 MG/2ML; 1.5 mL lidocaine 1 % Aspirate (Right): 0 mL Medications (Left): 20 mg Sodium Hyaluronate 20 MG/2ML; 1.5 mL lidocaine 1 % Aspirate (Left): 0 mL Outcome: tolerated well, no immediate complications Procedure, treatment alternatives, risks and benefits explained, specific risks discussed. Consent was given by the patient. Immediately prior to procedure a time out was called to verify the correct patient, procedure, equipment, support staff and site/side marked as required. Patient was prepped and draped in the usual sterile fashion.     Patient tolerated the procedure well.  Hazel Sams, PA-C

## 2017-12-13 ENCOUNTER — Ambulatory Visit: Payer: Medicare Other | Admitting: Physician Assistant

## 2018-01-06 ENCOUNTER — Other Ambulatory Visit: Payer: Self-pay | Admitting: Physician Assistant

## 2018-01-17 ENCOUNTER — Ambulatory Visit: Payer: Medicare Other | Admitting: Rheumatology

## 2018-02-28 ENCOUNTER — Ambulatory Visit: Payer: Medicare Other | Admitting: Obstetrics & Gynecology

## 2018-03-10 ENCOUNTER — Telehealth: Payer: Self-pay | Admitting: Family Medicine

## 2018-03-10 NOTE — Telephone Encounter (Signed)
Patient needs to schedule an AWV. It was scheduled with Dr. Brigitte Pulse on 11/27/2018. I scheduled the patient a CPE with Dr. Pamella Pert on 11/23/2018, pt would like to try and have her AWV on the same day if possible.

## 2018-03-14 NOTE — Progress Notes (Signed)
Office Visit Note  Patient: [name]             Date of Birth: [birth year]           MRN: [id number]             PCP: Rutherford Guys, MD Referring: Shawnee Knapp, MD Visit Date: 03/28/2018 Occupation: _0 @  Subjective:  Bilateral knee joint pain   History of Present Illness: [name] is a 68 y.o. female with history of osteoarthritis and DDD.  She reports she has bilateral knee joint pain, worse in the right knee joint.  She states she recently visited [place] and reports she "overdid" it.  She states both knee joints have been more swollen.  She would like cortisone injections bilaterally.  She reports she was having right shoulder pain and was evaluated by Dr. Rip Harbour.  She was also diagnosed with carpal tunnel of the right wrist joint.    She reports she recently had a left trochanteric bursa cortisone injection that helped relieved her discomfort.  She is currently going to physical therapy for muscle strengthening.    Activities of Daily Living:  Patient reports morning stiffness for 30-60  minutes.   Patient Reports nocturnal pain.  Difficulty dressing/grooming: Denies Difficulty climbing stairs: Reports Difficulty getting out of chair: Reports Difficulty using hands for taps, buttons, cutlery, and/or writing: Denies  Review of Systems  Constitutional: Negative for fatigue.  HENT: Negative for mouth sores, mouth dryness and nose dryness.   Eyes: Negative for pain, visual disturbance and dryness.  Respiratory: Negative for cough, hemoptysis, shortness of breath and difficulty breathing.   Cardiovascular: Negative for chest pain, palpitations, hypertension and swelling in legs/feet.  Gastrointestinal: Negative for blood in stool, constipation and diarrhea.  Endocrine: Negative for increased urination.  Genitourinary: Negative for painful urination.  Musculoskeletal: Positive for arthralgias, joint pain and morning stiffness. Negative for joint  swelling, myalgias, muscle weakness, muscle tenderness and myalgias.  Skin: Negative for color change, pallor, rash, hair loss, nodules/bumps, skin tightness, ulcers and sensitivity to sunlight.  Neurological: Negative for dizziness, numbness, headaches and weakness.  Hematological: Negative for swollen glands.  Psychiatric/Behavioral: Negative for depressed mood and sleep disturbance. The patient is not nervous/anxious.     PMFS History:  Patient Active Problem List   Diagnosis Date Noted  . Morbid obesity with alveolar hypoventilation ([place]) 07/28/2017  . Severe obstructive sleep apnea 07/28/2017  . Genetic testing 11/04/2016  . Family history of breast cancer   . DJD (degenerative joint disease), cervical 07/28/2016  . DDD (degenerative disc disease), lumbar 07/28/2016  . History of DVT (deep vein thrombosis) 07/28/2016  . History of ovarian cancer 07/28/2016  . Primary osteoarthritis of both knees 04/20/2016  . Primary osteoarthritis of both hands 04/20/2016  . Primary osteoarthritis of both feet 04/20/2016  . Primary osteoarthritis of both hips 04/20/2016  . Vitamin D deficiency 04/20/2016  . Primary open angle glaucoma of both eyes, moderate stage 12/30/2015  . Hyperlipidemia 10/15/2015  . Anemia 10/15/2015  . Sleep apnea with use of continuous positive airway pressure (CPAP) 07/22/2014  . OSA on CPAP 07/04/2013  . Obesity, morbid ([place]) 07/04/2013  . Morbid obesity ([place]) 11/24/2011  . Arthritis of knee 11/24/2011  . Class 3 severe obesity due to excess calories without serious comorbidity with body mass index (BMI) of 50.0 to 59.9 in adult ([place]) 12/04/2010  . Sleep apnea 12/04/2010  . ELECTROCARDIOGRAM, ABNORMAL 06/02/2009  . Acute thromboembolism of deep veins  of lower extremity ([place]) 03/26/2008  . EDEMA 03/26/2008  . ADENOCARCINOMA, OVARY 03/25/2008  . ESSENTIAL HYPERTENSION, BENIGN 03/25/2008    Past Medical History:  Diagnosis Date  . Anemia    oral iron occ.  .  Arthritis   . Basal cell carcinoma of nose   . Bronchitis   . Bursitis of left hip   . Cancer of ovary ([place]) 2005   Stage 1A, BRCA 1/2 neg 6/14  . Carpal tunnel syndrome    Right hand  . Degenerative joint disease of both hips 2019  . Diverticulitis    past history  . DVT (deep vein thrombosis) in pregnancy    LEFT LEG  . Family history of breast cancer   . Femoral DVT (deep venous thrombosis) ([place]) 2008   -"wears compression hose all times" left leg  . FH: genetic disease carrier 11/05/2016  . Genetic testing 11/04/2016   Multi-Cancer panel (83 genes) @ [facility] - see report; special interpretation  . History of ovarian cancer   . HTN (hypertension)   . Hyperlipidemia   . OSA (obstructive sleep apnea)    hypoventilation, on CPAP  . Osteopenia 2019  . Shingles     Family History  Problem Relation Age of Onset  . Alzheimer's disease Mother   . Hypertension Mother   . Alzheimer's disease Father   . Hypertension Father   . Colon cancer Father 27       deceased 32  . Hypertension Sister   . Breast cancer Sister 45  . Hypertension Brother   . Prostate cancer Brother 10  . Prostate cancer Other        father's maternal half-brother  . Breast cancer Other 11       daughter of brother; currently 29   Past Surgical History:  Procedure Laterality Date  . BASAL CELL CARCINOMA EXCISION     resection with plastic surgery reconstrustion  . bilateral breast mass  1970   benigned, left breast  . cataract surgery Bilateral 2395,3202, 2005   with bilateral lens replacements  . COLONOSCOPY WITH PROPOFOL N/A 05/21/2014   Procedure: COLONOSCOPY WITH PROPOFOL;  Surgeon: Juanita Craver, MD;  Location: [facility];  Service: Endoscopy;  Laterality: N/A;  . HERNIA REPAIR  3343   umbilical repair with mesh   . 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 PSH showed an AHI of 120!!!! titrated to 8 cm watr cPAP , residual AHI of 1 and downlaod was done  01-23-11 .  excellent compliance - 7 hours and low leak,  nasal pillow mask.    BMI is considered morbidly obese.  discussed low carb  diet - weight watchers and restricted exercise. , aquatic .    Patient cannot exercise due to soreness in proximal muscles.and  would like to add coenzyme q 10 and carnitine . She needs a medical weight loss regimen - bariatric surgery?       FSS 48, Ep[place] 4 again ,  CMS compliant  residual AHI 1.1 and average use 6.48  hours , no naps    Patient is single and lives alone.   Patient does not have any children.   Patient is retired.   Patient has a Master's degree.   Patient is left-handed.   Patient drinks tea occasionally.  Immunization History  Administered Date(s) Administered  . Influenza, High Dose Seasonal PF 11/21/2017  . Influenza,inj,Quad PF,6+ Mos 09/18/2013, 10/10/2014, 10/16/2015, 11/18/2016  . Pneumococcal Conjugate-13 09/18/2013  . Pneumococcal Polysaccharide-23 10/10/2014  . Td 07/13/2014  . Zoster Recombinat (Shingrix) 05/26/2017, 08/27/2017     Objective: Vital Signs: BP (!) 141/86   Pulse 78   Resp 13   Ht _0  (1.702 m)   Wt (!) 300 lb 9.6 oz (136.4 kg)   LMP 01/12/2003   BMI 47.08 kg/m    Physical Exam Vitals signs and nursing note reviewed.  Constitutional:      Appearance: She is well-developed.  HENT:     Head: Normocephalic and atraumatic.  Eyes:     Conjunctiva/sclera: Conjunctivae normal.  Neck:     Musculoskeletal: Normal range of motion.  Cardiovascular:     Rate and Rhythm: Normal rate and regular rhythm.     Heart sounds: Normal heart sounds.  Pulmonary:     Effort: Pulmonary effort is normal.     Breath sounds: Normal breath sounds.  Abdominal:     General: Bowel sounds are normal.     Palpations: Abdomen is soft.  Lymphadenopathy:     Cervical: No cervical adenopathy.   Skin:    General: Skin is warm and dry.     Capillary Refill: Capillary refill takes less than 2 seconds.  Neurological:     Mental Status: She is alert and oriented to person, place, and time.  Psychiatric:        Behavior: Behavior normal.      Musculoskeletal Exam: C-spine, thoracic spine, and lumbar spine good ROM.  No midline spinal tenderness.  No SI joint tenderness.  Shoulder joints, elbow joints, wrist joints, MCPs, PIPs, and DIPs good ROM with no synovitis.  PIP and DIP synovial thickening consistent with osteoarthritis. Hip joints good ROM with no discomfort. Mild tenderness of left trochanteric bursa. Bilateral knee warmth but no effusion.  Pedal edema bilaterally.   CDAI Exam: CDAI Score: Not documented Patient Global Assessment: Not documented; Provider Global Assessment: Not documented Swollen: Not documented; Tender: Not documented Joint Exam   Not documented   There is currently no information documented on the homunculus. Go to the Rheumatology activity and complete the homunculus joint exam.  Investigation: No additional findings.  Imaging: No results found.  Recent Labs: Lab Results  Component Value Date   WBC 4.5 11/21/2017   HGB 13.9 11/21/2017   PLT 176 11/21/2017   NA 144 11/21/2017   K 4.1 11/21/2017   CL 102 11/21/2017   CO2 22 11/21/2017   GLUCOSE 94 11/21/2017   BUN 14 11/21/2017   CREATININE 0.81 11/21/2017   BILITOT 0.7 11/21/2017   ALKPHOS 101 11/21/2017   AST 26 11/21/2017   ALT 22 11/21/2017   PROT 7.3 11/21/2017   ALBUMIN 5.1 (H) 11/21/2017   CALCIUM 10.7 (H) 11/21/2017   GFRAA 86 11/21/2017    Speciality Comments: No specialty comments available.  Procedures:  Large Joint Inj: R knee on 03/28/2018 1:41 PM Indications: pain Details: 27 G 1.5 in needle, medial approach  Arthrogram: No  Medications: 1.5 mL lidocaine 1 %; 40 mg triamcinolone acetonide 40 MG/ML Aspirate: 0 mL Outcome: tolerated well, no immediate  complications Procedure, treatment alternatives, risks and benefits explained, specific risks discussed. Consent was given by the patient. Immediately prior to procedure a time out was called to verify the correct patient, procedure, equipment, support staff and site/side marked as required. Patient  was prepped and draped in the usual sterile fashion.     Allergies: Valtrex [valacyclovir hcl]; Benoxinate base; Sulfonamide derivatives; and Ivp dye [iodinated diagnostic agents]   Assessment / Plan:     Visit Diagnoses: Primary osteoarthritis of both hands: She has PIP and DIP synovial thickening consistent with osteoarthritis of both hands. No synovitis noted.  Complete fist formation bilaterally.  Joint protection and muscle strengthening were discussed.   Primary osteoarthritis of both knees: She has mild warmth of both knee joints but no joint effusion noted.  She presents today with increased pain in both knee joints.  She recently was in [place] and was walking more than normal which has caused increased pain in both knees.  She would like bilateral knee joint cortisone injections.  Her blood pressure was elevated today, so she was advised to monitor her BP closely.   We decided to only perform 1 cortisone injection due to elevated BP.  She can return to have her left knee injected once her BP is better controlled. She continues to walk with a cane.  Primary osteoarthritis of both hips: She has good ROM with no discomfort at this time.   Primary osteoarthritis of both feet: She has no pain in her feet at this time.  She wears proper fitting shoes.   DDD (degenerative disc disease), cervical: She has good ROM with no discomfort.  She has no symptoms of radiculopathy.   DDD (degenerative disc disease), lumbar - Dr. Mina Marble.  Lumbar MRI on 06/14/08 revealed Mild multifactorial spinal stenosis L1-L2, L3-L4. She has chronic lower back pain.  She is currently going to physical therapy for lower extremity  muscle strengthening.   Carpal tunnel syndrome, right upper limb - She is currently wearing a splint, which has been improving her symptoms.   Trochanteric bursitis, left hip - She is followed by Dr. Royce Macadamia who has performed cortisone injections in the past.  Other medical conditions are listed as follows:   History of vitamin D deficiency  History of hyperlipidemia  History of hypertension  History of sleep apnea  History of anemia  History of ovarian cancer  History of breast cancer  History of DVT (deep vein thrombosis)  History of hypercholesterolemia   Orders: Orders Placed This Encounter  Procedures  . Large Joint Inj   No orders of the defined types were placed in this encounter.     Follow-Up Instructions: Return in about 6 months (around 09/28/2018) for Osteoarthritis, DDD.   Ofilia Neas, PA-C  Note - This record has been created using Dragon software.  Chart creation errors have been sought, but may not always  have been located. Such creation errors do not reflect on  the standard of medical care.

## 2018-03-15 ENCOUNTER — Telehealth: Payer: Self-pay | Admitting: *Deleted

## 2018-03-15 DIAGNOSIS — Z1231 Encounter for screening mammogram for malignant neoplasm of breast: Secondary | ICD-10-CM

## 2018-03-15 DIAGNOSIS — Z8543 Personal history of malignant neoplasm of ovary: Secondary | ICD-10-CM

## 2018-03-15 DIAGNOSIS — Z803 Family history of malignant neoplasm of breast: Secondary | ICD-10-CM

## 2018-03-15 DIAGNOSIS — Z9189 Other specified personal risk factors, not elsewhere classified: Secondary | ICD-10-CM

## 2018-03-15 NOTE — Telephone Encounter (Signed)
Spoke with patient, advised per Dr. Sabra Heck. Patient request to proceed with bilateral breast MRI with and without contrast at [facility]. Advised I will place the order, our office will precert and [facility] will contact you directly to schedule. Patient request name of imaging and TCR percentage be sent in a MyChart message, she is driving. Information sent. Questions answered. Patient verbalizes understanding and is agreeable.   Routing to provider for final review. Patient is agreeable to disposition. Will close encounter.  Cc: Lerry Liner

## 2018-03-15 NOTE — Telephone Encounter (Signed)
-----   Message from Megan Salon, MD sent at 03/13/2018  9:30 PM EST ----- Regarding: breast MRI Please contact patient and see if she would like to proceed with breast MRI.  Lifetime risk of breast cancer is >24%.  We discussed this at her last AEX in 11/19.  Thanks.  MSM

## 2018-03-28 ENCOUNTER — Encounter: Payer: Self-pay | Admitting: Physician Assistant

## 2018-03-28 ENCOUNTER — Other Ambulatory Visit: Payer: Self-pay

## 2018-03-28 ENCOUNTER — Ambulatory Visit: Payer: Medicare Other | Admitting: Physician Assistant

## 2018-03-28 VITALS — BP 141/86 | HR 78 | Resp 13 | Ht 67.0 in | Wt 300.6 lb

## 2018-03-28 DIAGNOSIS — M25561 Pain in right knee: Secondary | ICD-10-CM

## 2018-03-28 DIAGNOSIS — G5601 Carpal tunnel syndrome, right upper limb: Secondary | ICD-10-CM

## 2018-03-28 DIAGNOSIS — Z8543 Personal history of malignant neoplasm of ovary: Secondary | ICD-10-CM

## 2018-03-28 DIAGNOSIS — M5136 Other intervertebral disc degeneration, lumbar region: Secondary | ICD-10-CM

## 2018-03-28 DIAGNOSIS — Z86718 Personal history of other venous thrombosis and embolism: Secondary | ICD-10-CM

## 2018-03-28 DIAGNOSIS — M19071 Primary osteoarthritis, right ankle and foot: Secondary | ICD-10-CM

## 2018-03-28 DIAGNOSIS — M160 Bilateral primary osteoarthritis of hip: Secondary | ICD-10-CM

## 2018-03-28 DIAGNOSIS — M19042 Primary osteoarthritis, left hand: Secondary | ICD-10-CM

## 2018-03-28 DIAGNOSIS — Z8669 Personal history of other diseases of the nervous system and sense organs: Secondary | ICD-10-CM

## 2018-03-28 DIAGNOSIS — Z8639 Personal history of other endocrine, nutritional and metabolic disease: Secondary | ICD-10-CM

## 2018-03-28 DIAGNOSIS — M19072 Primary osteoarthritis, left ankle and foot: Secondary | ICD-10-CM

## 2018-03-28 DIAGNOSIS — Z8679 Personal history of other diseases of the circulatory system: Secondary | ICD-10-CM

## 2018-03-28 DIAGNOSIS — Z862 Personal history of diseases of the blood and blood-forming organs and certain disorders involving the immune mechanism: Secondary | ICD-10-CM

## 2018-03-28 DIAGNOSIS — M19041 Primary osteoarthritis, right hand: Secondary | ICD-10-CM | POA: Diagnosis not present

## 2018-03-28 DIAGNOSIS — M7062 Trochanteric bursitis, left hip: Secondary | ICD-10-CM

## 2018-03-28 DIAGNOSIS — M5030 Other cervical disc degeneration, unspecified cervical region: Secondary | ICD-10-CM

## 2018-03-28 DIAGNOSIS — M170 Bilateral primary osteoarthritis of knee: Secondary | ICD-10-CM | POA: Diagnosis not present

## 2018-03-28 DIAGNOSIS — Z853 Personal history of malignant neoplasm of breast: Secondary | ICD-10-CM

## 2018-03-28 MED ORDER — LIDOCAINE HCL 1 % IJ SOLN
1.5000 mL | INTRAMUSCULAR | Status: AC | PRN
  Administered 2018-03-28: 1.5 mL

## 2018-03-28 MED ORDER — TRIAMCINOLONE ACETONIDE 40 MG/ML IJ SUSP
40.0000 mg | INTRAMUSCULAR | Status: AC | PRN
  Administered 2018-03-28: 40 mg via INTRA_ARTICULAR

## 2018-04-20 ENCOUNTER — Other Ambulatory Visit: Payer: Self-pay | Admitting: Physician Assistant

## 2018-04-20 NOTE — Telephone Encounter (Signed)
67yo, 300lbs, Scr 0.81 on 11/21/17, Crcl 168ml/min Last OV 11/15/17 Indication: VTE - lifelong VTE therapy indicated

## 2018-07-20 ENCOUNTER — Other Ambulatory Visit: Payer: Self-pay | Admitting: Cardiovascular Disease

## 2018-07-20 NOTE — Telephone Encounter (Signed)
Xarelot 20mg  refill request received; pt is 68 yrs old, wt-136.4kg, Crea-0.81 on 11/21/2017, last seen by Estella Husk on 11/15/2017, CrCl-139.33ml/min; will send in refill to requested pharmacy.

## 2018-08-03 ENCOUNTER — Ambulatory Visit: Payer: Medicare Other | Admitting: Neurology

## 2018-08-21 ENCOUNTER — Telehealth: Payer: Self-pay | Admitting: Neurology

## 2018-08-21 NOTE — Telephone Encounter (Signed)
Called the patient back. Advised that we can certainly get her set up with an apt. Patient didn't want to come inside and I offered her a video visit for the apt tomorrow with Ward Givens, NP 1:30 pm. Advised the process for mychart. Informed the patient if she has capability of bringing her machine or card to get a download that would be helpful. Patient will come around 11 am tomorrow.

## 2018-08-21 NOTE — Telephone Encounter (Signed)
Pt received messaged on her CPAP stating Motor life exceeded, pt is asking for a call to discuss a prescription for a new one

## 2018-08-22 ENCOUNTER — Telehealth (INDEPENDENT_AMBULATORY_CARE_PROVIDER_SITE_OTHER): Payer: Medicare Other | Admitting: Adult Health

## 2018-08-22 DIAGNOSIS — Z9989 Dependence on other enabling machines and devices: Secondary | ICD-10-CM

## 2018-08-22 DIAGNOSIS — G4733 Obstructive sleep apnea (adult) (pediatric): Secondary | ICD-10-CM | POA: Diagnosis not present

## 2018-08-22 NOTE — Progress Notes (Signed)
PATIENT: [place] DOB: 1950-06-06  REASON FOR VISIT: follow up HISTORY FROM: patient  Virtual Visit via Video Note  I connected with Kermit on 08/22/18 at  1:30 PM EDT by a video enabled telemedicine application located remotely at [facility] and verified that I am speaking with the correct person using two identifiers who was located at their own home.   I discussed the limitations of evaluation and management by telemedicine and the availability of in person appointments. The patient expressed understanding and agreed to proceed.   PATIENT: [place] DOB: 08/15/1950  REASON FOR VISIT: follow up HISTORY FROM: patient  HISTORY OF PRESENT ILLNESS:  Today 08/22/18:  Ms. Hackert is a 68 year old female with a history of obstructive sleep apnea on CPAP.  Her CPAP download indicates that she use her machine nightly for compliance of 100%.  She use her machine greater than 4 hours each night.  On average she uses her machine 7 hours and 13 minutes.  Her residual AHI is 1 on 8 cm of water with EPR 2.  Her leak in the 95th percentile is 14.5 L/min.  She states that she has been sleeping on her stomach but states that it is hard to keep the mask from sliding off her face.  She states that she prefers to sleep on her back if possible.  She states that she has had this machine since she was diagnosed in 2008.  She reports that she has got a notification on machine that it is at the "end of his life."  She would like to get a new machine if possible.  She returns today for an evaluation.  HISTORY 07/28/17 (Copied from Dr.Dohmeier's note)  I have the pleasure of meeting today with [name]  a 68 year old [name] right-handed female who has been followed for severe sleep apnea.  Over the last years the patient has always been 100% compliance as she is today.  She has used the machine every day on average 6 hours 58 minutes, the CPAP machine  is set at 8 cmH2O with a full-time expiratory pressure relief of 2 cmH2O the residual AHI is 0.5 apneas per hour.  This is an excellent resolution, she does have mild air leakage, she does not have central apneas emerging under treatment. FSS was 15 points, Epworth  3 points GDS 0/15 points. The patient likes her CPAP and feels more energized since using it, she has ben able to lose weight - portion control and aqua gym exercises. She had PT directed exercises.   REVIEW OF SYSTEMS: Out of a complete 14 system review of symptoms, the patient complains only of the following symptoms, and all other reviewed systems are negative.  See HPI ALLERGIES: Allergies  Allergen Reactions   Valtrex [Valacyclovir Hcl] Swelling and Rash    Swelling of face and arm, rash   Benoxinate Base     Turns eyes blood red/burning. --in eyedrops--   Sulfonamide Derivatives    Ivp Dye [Iodinated Diagnostic Agents] Rash    Rash on back    HOME MEDICATIONS: Outpatient Medications Prior to Visit  Medication Sig Dispense Refill   ACETAMINOPHEN PO Take 1,300 mg by mouth 3 (three) times daily.      atorvastatin (LIPITOR) 40 MG tablet Take 1 tablet (40 mg total) by mouth daily. 90 tablet 3   Calcium Carb-Cholecalciferol (CALCIUM 600 + D PO) Take 1 tablet daily by mouth.  Cholecalciferol (VITAMIN D3) 50 MCG (2000 UT) capsule daily.     EUFLEXXA 20 MG/2ML SOSY      fish oil-omega-3 fatty acids 1000 MG capsule Take 1-2 g by mouth 2 (two) times daily.      fluticasone (FLONASE) 50 MCG/ACT nasal spray Place 2 sprays into both nostrils daily. 48 g 3   furosemide (LASIX) 20 MG tablet Take 1 tablet (20 mg total) by mouth daily. 90 tablet 3   lisinopril (PRINIVIL,ZESTRIL) 40 MG tablet TAKE 1 TABLET BY MOUTH EVERY DAY 90 tablet 3   Multiple Vitamin (MULTIVITAMIN) tablet Take 1 tablet by mouth every morning.      niacin 500 MG tablet Take 500 mg by mouth every evening.      XARELTO 20 MG TABS tablet TAKE 1  TABLET BY MOUTH EVERY DAY 90 tablet 1   No facility-administered medications prior to visit.     PAST MEDICAL HISTORY: Past Medical History:  Diagnosis Date   Anemia    oral iron occ.   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   -"wears compression hose all times" left leg   FH: genetic disease carrier 11/05/2016   Genetic testing 11/04/2016   Multi-Cancer panel (83 genes) @ [facility] - see report; special interpretation   History of ovarian cancer    HTN (hypertension)    Hyperlipidemia    OSA (obstructive sleep apnea)    hypoventilation, on CPAP   Osteopenia 2019   Shingles     PAST SURGICAL HISTORY: Past Surgical History:  Procedure Laterality Date   BASAL CELL CARCINOMA EXCISION     resection with plastic surgery reconstrustion   bilateral breast mass  1970   benigned, left breast   cataract surgery Bilateral [id number], 2005   with bilateral lens replacements   COLONOSCOPY WITH PROPOFOL N/A 05/21/2014   Procedure: COLONOSCOPY WITH PROPOFOL;  Surgeon: Juanita Craver, MD;  Location: [facility];  Service: Endoscopy;  Laterality: N/A;   HERNIA REPAIR  5638   umbilical repair with mesh    TOTAL ABDOMINAL HYSTERECTOMY W/ BILATERAL SALPINGOOPHORECTOMY  2005   Stage IA ovarian cancer    FAMILY HISTORY: Family History  Problem Relation Age of Onset   Alzheimer's disease Mother    Hypertension Mother    Alzheimer's disease Father    Hypertension Father    Colon cancer Father 13       deceased 9   Hypertension Sister    Breast cancer Sister 32   Hypertension Brother     Prostate cancer Brother 65   Prostate cancer Other        father's maternal half-brother   Breast cancer Other 64       daughter of brother; currently 63    SOCIAL HISTORY: Social History   Socioeconomic History   Marital status: Single    Spouse name: Not on file   Number of children: 0   Years of education: masters   Highest education level: Master's degree (e.g., MA, MS,  MEng, MEd, MSW, MBA)  Occupational History   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, product/process development strain: Not hard at [employer]    Worry: Never true    Inability: Never true   Transportation needs    Medical: No    Non-medical: No  Tobacco Use   Smoking status: Never Smoker   Smokeless tobacco: Never Used   Tobacco comment: only for 6 months in the 70s  Substance and Sexual Activity   Alcohol use: Yes    Alcohol/week: 0.0 standard drinks    Comment: rare, 2 yearly   Drug use: Never   Sexual activity: Not Currently    Partners: Female    Birth control/protection: Surgical  Lifestyle   Physical activity    Days per week: 3 days    Minutes per session: 60 min   Stress: Not at all  Relationships   Social connections    Talks on phone: More than three times a week    Gets together: More than three times a week    Attends religious service: Never    Active member of club or organization: Yes    Attends meetings of clubs or organizations: 1 to 4 times per year    Relationship status: Never married   Intimate partner violence    Fear of current or ex partner: No    Emotionally abused: No    Physically abused: No    Forced sexual activity: No  Other Topics Concern   Not on file  Social History Narrative   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 PSH showed an AHI of 120!!!! titrated to 8 cm watr cPAP , residual AHI of 1 and downlaod was done  01-23-11 .  excellent  compliance - 7 hours and low leak,  nasal pillow mask.    BMI is considered morbidly obese.  discussed low carb  diet - weight watchers and restricted exercise. , aquatic .    Patient cannot exercise due to soreness in proximal muscles.and  would like to add coenzyme q 10 and carnitine . She needs a medical weight loss regimen - bariatric surgery?       FSS 48, Epworth 4 again ,  CMS compliant  residual AHI 1.1 and average use 6.48  hours , no naps    Patient is single and lives alone.   Patient does not have any children.   Patient is retired.   Patient has a Master's degree.   Patient is left-handed.   Patient drinks tea occasionally.                PHYSICAL EXAM Generalized: Well developed, in no acute distress   Neurological examination  Mentation: Alert oriented to time, place, history taking. language fluent Cranial nerve II-XII:Extraocular movements were full. Facial symmetry noted.  Head turning and shoulder shrug  were normal and symmetric. Sensory: Sensory testing is intact to soft touch on all 4 extremities subjectively per patient Reflexes: UTA  DIAGNOSTIC DATA (LABS, IMAGING, TESTING) - I reviewed patient records, labs, notes, testing and imaging myself where available.  Lab Results  Component Value Date   WBC 4.5 11/21/2017   HGB 13.9 11/21/2017   HCT 42.2 11/21/2017   MCV 90 11/21/2017   PLT 176 11/21/2017      Component Value Date/Time   NA 144 11/21/2017 1146  K 4.1 11/21/2017 1146   CL 102 11/21/2017 1146   CO2 22 11/21/2017 1146   GLUCOSE 94 11/21/2017 1146   GLUCOSE 100 (H) 10/16/2015 0948   BUN 14 11/21/2017 1146   CREATININE 0.81 11/21/2017 1146   CREATININE 0.89 10/16/2015 0948   CALCIUM 10.7 (H) 11/21/2017 1146   PROT 7.3 11/21/2017 1146   ALBUMIN 5.1 (H) 11/21/2017 1146   AST 26 11/21/2017 1146   ALT 22 11/21/2017 1146   ALKPHOS 101 11/21/2017 1146   BILITOT 0.7 11/21/2017 1146   GFRNONAA 74 11/21/2017 1146   GFRNONAA 71 10/10/2014  1017   GFRAA 86 11/21/2017 1146   GFRAA 81 10/10/2014 1017   Lab Results  Component Value Date   CHOL 204 (H) 11/21/2017   HDL 49 11/21/2017   LDLCALC 99 11/21/2017   TRIG 280 (H) 11/21/2017   CHOLHDL 4.2 11/21/2017   Lab Results  Component Value Date   HGBA1C 5.2 10/16/2015   Lab Results  Component Value Date   MAUQJFHL45 625 12/21/2006   Lab Results  Component Value Date   TSH 2.040 11/18/2016      ASSESSMENT AND PLAN 68 y.o. year old female  has a past medical history of Anemia, Arthritis, Basal cell carcinoma of nose, Bronchitis, Bursitis of left hip, Cancer of ovary ([place]) (2005), Carpal tunnel syndrome, Degenerative joint disease of both hips (2019), Diverticulitis, DVT (deep vein thrombosis) in pregnancy, Family history of breast cancer, Femoral DVT (deep venous thrombosis) ([place]) (2008), FH: genetic disease carrier (11/05/2016), Genetic testing (11/04/2016), History of ovarian cancer, HTN (hypertension), Hyperlipidemia, OSA (obstructive sleep apnea), Osteopenia (2019), and Shingles. here with;  1. OSA on CPAp  Patient CPAP download shows excellent compliance and good treatment of her apnea.  She is encouraged to continue using CPAP nightly and greater than 4 hours each night.  I have placed an order for a new machine.  She is advised that if her symptoms worsen or she develops new symptoms she should let us know.  She will follow-up after getting a new machine per insurance requirements.   I spent 15 minutes with the patient. 50% of this time was spent reviewing CPAP download   Ward Givens, MSN, NP-C 08/22/2018, 2:02 PM [facility] [address] [place], [place] [address] [phone]

## 2018-08-23 NOTE — Progress Notes (Signed)
[facility] received cpap orders. 08-23-18 sy

## 2018-08-30 ENCOUNTER — Telehealth: Payer: Self-pay | Admitting: Obstetrics & Gynecology

## 2018-08-30 NOTE — Telephone Encounter (Signed)
Order placed for recommended breast MRI. Per call to [facility], with [facility], multiples calls placed to patient to schedule requested MRI. Patient has not returned calls to schedule. Referral message forwarded to Dr Sabra Heck, ok to close order.   Forwarding to Dr Sabra Heck for final review. Will close referral

## 2018-09-27 ENCOUNTER — Other Ambulatory Visit: Payer: Self-pay | Admitting: Physician Assistant

## 2018-11-10 ENCOUNTER — Other Ambulatory Visit: Payer: Self-pay | Admitting: Physician Assistant

## 2018-11-18 LAB — HM MAMMOGRAPHY

## 2018-11-21 NOTE — Progress Notes (Signed)
Cardiology Office Note    Date:  11/27/2018   ID:  Kathleen, [name] May 04, 1950, MRN [id number]  PCP:  Kathleen Guys, MD  Cardiologist: Kathleen Rouge, MD EPS: None  No chief complaint on file.   History of Present Illness:  [name] is a 68 y.o. 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  She has been kept on anticoagulation and compression hose.  She also has an abnormal EKG in the past with poor R wave progression suspected from lead placement and body habitus.  She has normal LV function.  Has severe sleep apnea on CPAP.  Also has hypertension, hyperlipidemia.   Sees [facility] for arthritis in knees/hips  and right shoulder with carpal tunnel in wrist  She has received injections in past  She asked about acupuncture and I would not think that xarelto would need to be held for this    Past Medical History:  Diagnosis Date  . Anemia    oral iron occ.  . Arthritis   . Basal cell carcinoma of nose   . Bronchitis   . Bursitis of left hip   . Cancer of ovary ([place]) 2005   Stage 1A, BRCA 1/2 neg 6/14  . Carpal tunnel syndrome    Right hand  . Degenerative joint disease of both hips 2019  . Diverticulitis    past history  . DVT (deep vein thrombosis) in pregnancy    LEFT LEG  . Family history of breast cancer   . 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 FH: genetic disease carrier 11/05/2016  . Genetic testing 11/04/2016   Multi-Cancer panel (83 genes) @ [facility] - see report; special interpretation  . History of ovarian cancer   . HTN (hypertension)   . Hyperlipidemia   . OSA (obstructive sleep apnea)    hypoventilation, on CPAP  . Osteopenia 2019  . Shingles     Past Surgical History:  Procedure Laterality Date  . BASAL CELL CARCINOMA EXCISION     resection with plastic surgery reconstrustion  . bilateral breast mass  1970   benigned, left breast  . cataract surgery Bilateral [id number], 2005   with bilateral lens replacements  . COLONOSCOPY WITH PROPOFOL N/A 05/21/2014   Procedure: COLONOSCOPY WITH PROPOFOL;  Surgeon: Juanita Craver, MD;  Location: [facility];  Service: Endoscopy;  Laterality: N/A;  . HERNIA REPAIR  8588   umbilical repair with mesh   . TOTAL ABDOMINAL HYSTERECTOMY W/ BILATERAL SALPINGOOPHORECTOMY  2005   Stage IA ovarian cancer    Current Medications: Current Meds  Medication Sig  . ACETAMINOPHEN PO Take 1,300 mg by mouth 3 (three) times daily.   Marland Kitchen atorvastatin (LIPITOR) 40 MG tablet Take 1 tablet (40 mg total) by mouth daily. Please schedule appt for future refills. 1st attempt.  . Calcium Carb-Cholecalciferol (CALCIUM 600 + D PO) Take 1 tablet daily by mouth.  . Cholecalciferol (VITAMIN D3) 50 MCG (2000 UT) capsule daily.  . EUFLEXXA 20 MG/2ML SOSY   . fish oil-omega-3 fatty acids 1000 MG capsule Take 1-2 g by mouth 2 (two) times daily.   . fluticasone (FLONASE) 50 MCG/ACT nasal spray Place 1 spray into both nostrils 2 (two) times daily.  . furosemide (LASIX) 20 MG tablet Take 1 tablet (20 mg total) by mouth daily. Please schedule appt  for future refills. 1st attempt  . lisinopril (ZESTRIL) 40 MG tablet TAKE 1 TABLET BY MOUTH EVERY DAY  . Multiple Vitamin (MULTIVITAMIN) tablet Take 1 tablet by mouth every morning.   . niacin 500 MG tablet Take 500 mg by mouth every evening.   . nystatin cream (MYCOSTATIN) Apply 1 application topically 2 (two) times daily.  [name] 20 MG TABS tablet TAKE 1 TABLET BY MOUTH EVERY DAY     Allergies:   Valtrex [valacyclovir hcl], Benoxinate base, Sulfonamide derivatives, and Ivp dye [iodinated diagnostic agents]   Social History   Socioeconomic History  . Marital status: Single    Spouse name: Not on file  . Number of children: 0  . Years of education: masters  . Highest  education level: Master's degree (e.g., MA, MS, MEng, MEd, MSW, MBA)  Occupational History  . 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 Financial resource strain: Not hard at all  . Food insecurity    Worry: Never true    Inability: Never true  . Transportation needs    Medical: No    Non-medical: No  Tobacco Use  . Smoking status: Never Smoker  . Smokeless tobacco: Never Used  . Tobacco comment: only for 6 months in the 70s  Substance and Sexual Activity  . Alcohol use: Yes    Alcohol/week: 0.0 standard drinks    Comment: rare, 2 yearly  . Drug use: Never  . Sexual activity: Not Currently    Partners: Female    Birth control/protection: Surgical  Lifestyle  . Physical activity    Days per week: 3 days    Minutes per session: 60 min  . Stress: Not at all  Relationships  . Social connections    Talks on phone: More than three times a week    Gets together: More than three times a week    Attends religious service: Never    Active member of club or organization: Yes    Attends meetings of clubs or organizations: 1 to 4 times per year    Relationship status: Never married  Other Topics Concern  . 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 PSH showed an AHI of 120!!!! titrated to 8 cm watr cPAP , residual AHI of 1 and downlaod was done  01-23-11 .  excellent compliance - 7 hours and low leak,  nasal pillow mask.    BMI is considered morbidly obese.  discussed low carb  diet - weight watchers and restricted exercise. , aquatic .    Patient cannot exercise due to soreness in proximal muscles.and  would like to add coenzyme q 10 and carnitine . She needs a medical weight loss regimen - bariatric surgery?       FSS 48, Epworth 4 again ,  CMS compliant  residual AHI 1.1 and average use 6.48  hours , no naps    Patient is single and lives alone.   Patient  does not have any children.   Patient is retired.   Patient has a Master's degree.   Patient is left-handed.   Patient drinks tea occasionally.               Family History:  The patient'sfamily history includes Alzheimer's disease in her father and mother; Breast cancer (age of onset: 1) in an other family  member; Breast cancer (age of onset: 74) in her sister; Colon cancer (age of onset: 67) in her father; Hypertension in her brother, father, mother, and sister; Prostate cancer in an other family member; Prostate cancer (age of onset: 44) in her brother.   ROS:   Please see the history of present illness.    Review of Systems  Constitution: Positive for weight loss.  HENT: Negative.   Eyes: Negative.   Cardiovascular: Negative.   Respiratory: Negative.   Hematologic/Lymphatic: Bruises/bleeds easily.  Musculoskeletal: Positive for back pain and myalgias. Negative for joint pain.  Gastrointestinal: Negative.   Genitourinary: Negative.   Neurological: Negative.    All other systems reviewed and are negative.   PHYSICAL EXAM:   VS:  BP 128/70   Pulse 83   Ht _0  (1.702 m)   Wt (!) 306 lb (138.8 kg)   LMP 01/12/2003   SpO2 97%   BMI 47.93 kg/m    Affect appropriate Obese female  HEENT: normal Neck supple with no adenopathy JVP normal no bruits no thyromegaly Lungs clear with no wheezing and good diaphragmatic motion Heart:  S1/S2 no murmur, no rub, gallop or click PMI normal Abdomen: benighn, BS positve, no tenderness, no AAA no bruit.  No HSM or HJR Distal pulses intact with no bruits Plus 2 LE edema with chronic stasis changes  Neuro non-focal Skin warm and dry Weakness LLE with bilateral knee arthritis    Wt Readings from Last 3 Encounters:  11/27/18 (!) 306 lb (138.8 kg)  11/23/18 (!) 306 lb 9.6 oz (139.1 kg)  03/28/18 (!) 300 lb 9.6 oz (136.4 kg)      Studies/Labs Reviewed:   EKG:  11/27/18 SR nonspecific ST changes  Recent Labs: 11/23/2018: ALT  22; BUN 17; Creatinine, Ser 0.69; Hemoglobin 13.9; Platelets 200; Potassium 3.9; Sodium 139   Lipid Panel    Component Value Date/Time   CHOL 181 11/23/2018 1157   TRIG 287 (H) 11/23/2018 1157   HDL 43 11/23/2018 1157   CHOLHDL 4.2 11/23/2018 1157   CHOLHDL 3.8 10/16/2015 0948   VLDL 51 (H) 10/16/2015 0948   LDLCALC 90 11/23/2018 1157    Additional studies/ records that were reviewed today include:      ASSESSMENT:    HTN  PLAN:  In order of problems listed above:  1. HTN:  Well controlled.  Continue current medications and low sodium Dash type diet.   2. OSA:  Discussed need for weight loss Continue CPAP followed by [facility]  3. 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 HLD:  Continue statin LDL 99   She will f/u with Dr Pamella Pert and only PRN with [place] as she really has no active cardiac issues    Medication Adjustments/Labs and Tests Ordered: Current medicines are reviewed at length with the patient today.  Concerns regarding medicines are outlined above.  Medication changes, Labs and Tests ordered today are listed in the Patient Instructions below. Patient Instructions  Medication Instructions:   *If you need a refill on your cardiac medications before your next appointment, please call your pharmacy*  Lab Work:  If you have labs (blood work) drawn today and your tests are completely normal, you will receive your results only by: Marland Kitchen MyChart Message (if you have MyChart) OR . A paper copy in the mail If you have any lab test that is abnormal or  we need to change your treatment, we will call you to review the results.  Testing/Procedures: None ordered today.  Follow-Up: At [facility], you and your health needs are our priority.  As part of our continuing mission to provide you with exceptional heart care, we have created designated Provider  Care Teams.  These Care Teams include your primary Cardiologist (physician) and Advanced Practice Providers (APPs -  Physician Assistants and Nurse Practitioners) who all work together to provide you with the care you need, when you need it.  Your next appointment:   as needed  The format for your next appointment:   In Person  Provider:   You may see Kathleen Rouge, MD or one of the following Advanced Practice Providers on your designated Care Team:    Truitt Merle, NP  Cecilie Kicks, NP  Kathyrn Drown, NP     Signed, Kathleen Rouge, MD  11/27/2018 4:03 PM    [facility] [place], [place], [place]  [address] Phone: [phone]; Fax: [phone]

## 2018-11-23 ENCOUNTER — Other Ambulatory Visit: Payer: Self-pay | Admitting: Cardiovascular Disease

## 2018-11-23 ENCOUNTER — Encounter: Payer: Self-pay | Admitting: Family Medicine

## 2018-11-23 ENCOUNTER — Ambulatory Visit (INDEPENDENT_AMBULATORY_CARE_PROVIDER_SITE_OTHER): Payer: Medicare Other | Admitting: Family Medicine

## 2018-11-23 ENCOUNTER — Other Ambulatory Visit: Payer: Self-pay

## 2018-11-23 VITALS — BP 136/88 | HR 80 | Temp 98.3°F | Ht 67.0 in | Wt 306.6 lb

## 2018-11-23 DIAGNOSIS — E782 Mixed hyperlipidemia: Secondary | ICD-10-CM

## 2018-11-23 DIAGNOSIS — Z23 Encounter for immunization: Secondary | ICD-10-CM

## 2018-11-23 DIAGNOSIS — R7989 Other specified abnormal findings of blood chemistry: Secondary | ICD-10-CM

## 2018-11-23 DIAGNOSIS — B372 Candidiasis of skin and nail: Secondary | ICD-10-CM | POA: Diagnosis not present

## 2018-11-23 DIAGNOSIS — Z0001 Encounter for general adult medical examination with abnormal findings: Secondary | ICD-10-CM | POA: Diagnosis not present

## 2018-11-23 DIAGNOSIS — Z0000 Encounter for general adult medical examination without abnormal findings: Secondary | ICD-10-CM

## 2018-11-23 DIAGNOSIS — H6991 Unspecified Eustachian tube disorder, right ear: Secondary | ICD-10-CM

## 2018-11-23 DIAGNOSIS — Z7901 Long term (current) use of anticoagulants: Secondary | ICD-10-CM

## 2018-11-23 DIAGNOSIS — E559 Vitamin D deficiency, unspecified: Secondary | ICD-10-CM

## 2018-11-23 MED ORDER — NYSTATIN 100000 UNIT/GM EX CREA: 1.0000 "application " | TOPICAL_CREAM | Freq: Two times a day (BID) | CUTANEOUS | 4 refills | Status: DC

## 2018-11-23 MED ORDER — FLUTICASONE PROPIONATE 50 MCG/ACT NA SUSP: 1.0000 | Freq: Two times a day (BID) | NASAL | 3 refills | Status: DC

## 2018-11-23 NOTE — Progress Notes (Signed)
Presents today for [employer] Visit-Subsequent.   Date of last exam: 11/21/2017  Interpreter used for this visit? no  Patient Care Team: Rutherford Guys, MD as PCP - General (Family Medicine) Josue Hector, MD as PCP - Cardiology (Cardiology) Dohmeier, Asencion Partridge, MD as Consulting Physician (Neurology) Josue Hector, MD as Consulting Physician (Cardiology) Bo Merino, MD as Consulting Physician (Rheumatology) Bettina Gavia, [place] (Optometry) Megan Salon, MD as Consulting Physician (Gynecology) Juanita Craver, MD as Consulting Physician (Gastroenterology)   Other items to address today:  OSA on cpap - managed by sleep, last OV aug 2020 Open angle glaucoma OU - Dr Gershon [name], last OV Jan 2020 OA/DDD - managed by ortho, last OB March 2020 WWE/h/o ovarian cancer - managed by Mr Sabra Heck, last OV Jan 2019 HTN/h/o DVT - managed by Dr Johnsie Cancel, cards, on xeralto  Cancer Screening: Cervical: n/a Breast: yes, mammo a week ago, [facility], per patient normal, OB Colon: yes  Prostate: n/a   Other Screening: Last screening for diabetes: Nov 2019, glu 94, fasting, by cards Last lipid screening: Nov 2019, by cards Lab Results  Component Value Date   CHOL 204 (H) 11/21/2017   HDL 49 11/21/2017   [place] 99 11/21/2017   TRIG 280 (H) 11/21/2017   CHOLHDL 4.2 11/21/2017   Dexa: osteopenia, Aug 2019, [facility], [place]  Lab Results  Component Value Date   IRON 56 11/21/2017   TIBC 334 11/21/2017   FERRITIN 1,003 (H) 11/21/2017  patient discussed labs with Dr Migdalia Dk whom then discussed with hematology, per VM recording - thoughts were most likely reactive from inflammation from OA, no referral needed and no concerns for [facility] at this time, recommended surveillance  Lab Results  Component Value Date   CALCIUM 10.7 (H) 11/21/2017  was reduced to once a day  ADVANCE DIRECTIVES: Discussed: yes Patient desires CPR (unsure), mechanical ventilation (unsure), prolonged artificial  support (may include mechanical ventilation, tube/PEG feeding, etc) (No ). On File: yes Materials Provided: n/a  Immunization status:  Immunization History  Administered Date(s) Administered  . Influenza, High Dose Seasonal PF 11/21/2017  . Influenza,inj,Quad PF,6+ Mos 09/18/2013, 10/10/2014, 10/16/2015, 11/18/2016  . Pneumococcal Conjugate-13 09/18/2013  . Pneumococcal Polysaccharide-23 10/10/2014  . Td 07/13/2014  . Zoster Recombinat (Shingrix) 05/26/2017, 08/27/2017     There are no preventive care reminders to display for this patient.   Functional Status Survey: Is the patient deaf or have difficulty hearing?: No Does the patient have difficulty seeing, even when wearing glasses/contacts?: No Does the patient have difficulty concentrating, remembering, or making decisions?: No Does the patient have difficulty walking or climbing stairs?: Yes Does the patient have difficulty dressing or bathing?: No Does the patient have difficulty doing errands alone such as visiting a doctor's office or shopping?: No she does use a cane and right wrist brace Uses compression stockings  Home Environment: no fall risk, feels safe at home  Urinary Incontinence Screening: denies any concerns  Patient Active Problem List   Diagnosis Date Noted  . Morbid obesity with alveolar hypoventilation ([place]) 07/28/2017  . Severe obstructive sleep apnea 07/28/2017  . Genetic testing 11/04/2016  . Family history of breast cancer   . DJD (degenerative joint disease), cervical 07/28/2016  . DDD (degenerative disc disease), lumbar 07/28/2016  . History of DVT (deep vein thrombosis) 07/28/2016  . History of ovarian cancer 07/28/2016  . Primary osteoarthritis of both knees 04/20/2016  . Primary osteoarthritis of both hands 04/20/2016  . Primary osteoarthritis  of both feet 04/20/2016  . Primary osteoarthritis of both hips 04/20/2016  . Vitamin D deficiency 04/20/2016  . Primary open angle glaucoma of  both eyes, moderate stage 12/30/2015  . Hyperlipidemia 10/15/2015  . Anemia 10/15/2015  . Sleep apnea with use of continuous positive airway pressure (CPAP) 07/22/2014  . OSA on CPAP 07/04/2013  . Obesity, morbid ([place]) 07/04/2013  . Morbid obesity ([place]) 11/24/2011  . Arthritis of knee 11/24/2011  . Class 3 severe obesity due to excess calories without serious comorbidity with body mass index (BMI) of 50.0 to 59.9 in adult ([place]) 12/04/2010  . Sleep apnea 12/04/2010  . ELECTROCARDIOGRAM, ABNORMAL 06/02/2009  . Acute thromboembolism of deep veins of lower extremity ([place]) 03/26/2008  . EDEMA 03/26/2008  . ADENOCARCINOMA, OVARY 03/25/2008  . ESSENTIAL HYPERTENSION, BENIGN 03/25/2008     Past Medical History:  Diagnosis Date  . Anemia    oral iron occ.  . Arthritis   . Basal cell carcinoma of nose   . Bronchitis   . Bursitis of left hip   . Cancer of ovary ([place]) 2005   Stage 1A, BRCA 1/2 neg 6/14  . Carpal tunnel syndrome    Right hand  . Degenerative joint disease of both hips 2019  . Diverticulitis    past history  . DVT (deep vein thrombosis) in pregnancy    LEFT LEG  . Family history of breast cancer   . Femoral DVT (deep venous thrombosis) ([place]) 2008   -"wears compression hose all times" left leg  . FH: genetic disease carrier 11/05/2016  . Genetic testing 11/04/2016   Multi-Cancer panel (83 genes) @ [facility] - see report; special interpretation  . History of ovarian cancer   . HTN (hypertension)   . Hyperlipidemia   . OSA (obstructive sleep apnea)    hypoventilation, on CPAP  . Osteopenia 2019  . Shingles      Past Surgical History:  Procedure Laterality Date  . BASAL CELL CARCINOMA EXCISION     resection with plastic surgery reconstrustion  . bilateral breast mass  1970   benigned, left breast  . cataract surgery Bilateral [id number], 2005   with bilateral lens replacements  . COLONOSCOPY WITH PROPOFOL N/A 05/21/2014   Procedure: COLONOSCOPY WITH PROPOFOL;   Surgeon: Juanita Craver, MD;  Location: [facility];  Service: Endoscopy;  Laterality: N/A;  . HERNIA REPAIR  0240   umbilical repair with mesh   . TOTAL ABDOMINAL HYSTERECTOMY W/ BILATERAL SALPINGOOPHORECTOMY  2005   Stage IA ovarian cancer     Family History  Problem Relation Age of Onset  . Alzheimer's disease Mother   . Hypertension Mother   . Alzheimer's disease Father   . Hypertension Father   . Colon cancer Father 26       deceased 29  . Hypertension Sister   . Breast cancer Sister 79  . Hypertension Brother   . Prostate cancer Brother 46  . Prostate cancer Other        father's maternal half-brother  . Breast cancer Other 24       daughter of brother; currently 14     Social History   Socioeconomic History  . Marital status: Single    Spouse name: Not on file  . Number of children: 0  . Years of education: masters  . Highest education level: Master's degree (e.g., MA, MS, MEng, MEd, MSW, MBA)  Occupational History  . Occupation: Clinical research associate: [employer]  Comment: fifth grade (retired)  Social Needs  . Financial resource strain: Not hard at all  . Food insecurity    Worry: Never true    Inability: Never true  . Transportation needs    Medical: No    Non-medical: No  Tobacco Use  . Smoking status: Never Smoker  . Smokeless tobacco: Never Used  . Tobacco comment: only for 6 months in the 70s  Substance and Sexual Activity  . Alcohol use: Yes    Alcohol/week: 0.0 standard drinks    Comment: rare, 2 yearly  . Drug use: Never  . Sexual activity: Not Currently    Partners: Female    Birth control/protection: Surgical  Lifestyle  . Physical activity    Days per week: 3 days    Minutes per session: 60 min  . Stress: Not at all  Relationships  . Social connections    Talks on phone: More than three times a week    Gets together: More than three times a week    Attends religious service: Never    Active member of club or  organization: Yes    Attends meetings of clubs or organizations: 1 to 4 times per year    Relationship status: Never married  . Intimate partner violence    Fear of current or ex partner: No    Emotionally abused: No    Physically abused: No    Forced sexual activity: No  Other Topics Concern  . 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 PSH showed an AHI of 120!!!! titrated to 8 cm watr cPAP , residual AHI of 1 and downlaod was done  01-23-11 .  excellent compliance - 7 hours and low leak,  nasal pillow mask.    BMI is considered morbidly obese.  discussed low carb  diet - weight watchers and restricted exercise. , aquatic .    Patient cannot exercise due to soreness in proximal muscles.and  would like to add coenzyme q 10 and carnitine . She needs a medical weight loss regimen - bariatric surgery?       FSS 48, Epworth 4 again ,  CMS compliant  residual AHI 1.1 and average use 6.48  hours , no naps    Patient is single and lives alone.   Patient does not have any children.   Patient is retired.   Patient has a Master's degree.   Patient is left-handed.   Patient drinks tea occasionally.               Allergies  Allergen Reactions  . Valtrex [Valacyclovir Hcl] Swelling and Rash    Swelling of face and arm, rash  . Benoxinate Base     Turns eyes blood red/burning. --in eyedrops--  . Sulfonamide Derivatives   . Ivp Dye [Iodinated Diagnostic Agents] Rash    Rash on back     Prior to Admission medications   Medication Sig Start Date End Date Taking? Authorizing Provider  ACETAMINOPHEN PO Take 1,300 mg by mouth 3 (three) times daily.    Yes [provider]  atorvastatin (LIPITOR) 40 MG tablet Take 1 tablet (40 mg total) by mouth daily. Please schedule appt for future refills. 1st attempt. 09/27/18  Yes Josue Hector, MD  Calcium Carb-Cholecalciferol (CALCIUM 600 + D PO) Take 1 tablet daily by mouth.   Yes  [provider]  Cholecalciferol (VITAMIN D3) 50 MCG (2000  UT) capsule daily.   Yes [provider]  EUFLEXXA 20 MG/2ML SOSY  02/04/17  Yes [provider]  fish oil-omega-3 fatty acids 1000 MG capsule Take 1-2 g by mouth 2 (two) times daily.    Yes [provider]  fluticasone (FLONASE) 50 MCG/ACT nasal spray Place 2 sprays into both nostrils daily. 11/21/17  Yes Shawnee Knapp, MD  furosemide (LASIX) 20 MG tablet Take 1 tablet (20 mg total) by mouth daily. Please schedule appt for future refills. 1st attempt 09/27/18  Yes Josue Hector, MD  lisinopril (ZESTRIL) 40 MG tablet TAKE 1 TABLET BY MOUTH EVERY DAY 11/10/18  Yes Imogene Burn, PA-C  Multiple Vitamin (MULTIVITAMIN) tablet Take 1 tablet by mouth every morning.    Yes [provider]  niacin 500 MG tablet Take 500 mg by mouth every evening.    Yes [provider]  XARELTO 20 MG TABS tablet TAKE 1 TABLET BY MOUTH EVERY DAY 07/20/18  Yes Josue Hector, MD     Depression screen [facility] 2/9 11/23/2018 11/21/2017 03/11/2017 03/04/2017 03/01/2017  Decreased Interest 0 0 0 0 0  Down, Depressed, Hopeless 0 0 0 0 0  PHQ - 2 Score 0 0 0 0 0  Some recent data might be hidden     Fall Risk  11/23/2018 11/21/2017 03/11/2017 03/04/2017 03/01/2017  Falls in the past year? 0 0 No No No  Number falls in past yr: 0 0 - - -  Injury with Fall? 0 0 - - -  Risk for fall due to : Impaired mobility;Impaired balance/gait - - - -   Review of Systems  Constitutional: Negative for chills and fever.  HENT: Negative for ear discharge, ear pain, hearing loss and tinnitus.        Pressure/muffled right ear  Respiratory: Negative for cough and shortness of breath.   Cardiovascular: Negative for chest pain, palpitations and leg swelling.  Gastrointestinal: Negative for abdominal pain, nausea and vomiting.  Musculoskeletal: Positive for back pain and joint pain. Negative for falls.  Skin: Positive for rash (red itchy  rash under breast and pannus).  Neurological: Negative for dizziness, tingling and headaches.  Endo/Heme/Allergies: Does not bruise/bleed easily.  Psychiatric/Behavioral: Negative for depression. The patient is not nervous/anxious and does not have insomnia.   All other systems reviewed and are negative.    PHYSICAL EXAM: BP 136/88   Pulse 80   Temp 98.3 F (36.8 C)   Ht '5\' 7"'  (1.702 m)   Wt (!) 306 lb 9.6 oz (139.1 kg)   LMP 01/12/2003   SpO2 95%   BMI 48.02 kg/m    Wt Readings from Last 3 Encounters:  11/23/18 (!) 306 lb 9.6 oz (139.1 kg)  03/28/18 (!) 300 lb 9.6 oz (136.4 kg)  12/05/17 299 lb 3.2 oz (135.7 kg)      Hearing Screening   '125Hz'  '250Hz'  '500Hz'  '1000Hz'  '2000Hz'  '3000Hz'  '4000Hz'  '6000Hz'  '8000Hz'   Right ear:           Left ear:             Visual Acuity Screening   Right eye Left eye Both eyes  Without correction: '20/25 20/25 20/25 '  With correction:       Physical Exam  Constitutional: She is oriented to person, place, and time and well-developed, well-nourished, and in no distress.  HENT:  Head: Normocephalic and atraumatic.  Right Ear: Hearing, tympanic membrane, external ear and ear canal normal.  Left Ear: Hearing,  tympanic membrane, external ear and ear canal normal.  Mouth/Throat: Oropharynx is clear and moist.  Eyes: Pupils are equal, round, and reactive to light. EOM are normal.  Neck: Neck supple. No thyromegaly present.  Cardiovascular: Normal rate, regular rhythm, normal heart sounds and intact distal pulses. Exam reveals no gallop and no friction rub.  No murmur heard. Pulmonary/Chest: Effort normal and breath sounds normal. She has no wheezes. She has no rales.  Abdominal: Soft. Bowel sounds are normal. She exhibits no distension and no mass. There is no abdominal tenderness.  Musculoskeletal: Normal range of motion.        General: No edema.  Lymphadenopathy:    She has no cervical adenopathy.  Neurological: She is alert and oriented to person,  place, and time. She has normal reflexes. Gait abnormal.  Skin: Skin is warm and dry.  Psychiatric: Mood and affect normal.  Nursing note and vitals reviewed.   Education/Counseling provided regarding diet and exercise, prevention of chronic diseases, smoking/tobacco cessation, if applicable, and reviewed "Covered Medicare Preventive Services."   ASSESSMENT/PLAN:  1. Medicare annual wellness visit, subsequent Routine HCM labs ordered. HCM reviewed/discussed. Anticipatory guidance regarding healthy weight, lifestyle and choices given.   2. Candidal intertrigo Discussed supportive measures, nystatin cream rx  3. Disorder of right eustachian tube Discussed supportive measures. Add OTC oral decongestant  4. Need for prophylactic vaccination and inoculation against influenza - Flu Vaccine QUAD High Dose(Fluad)  5. Elevated ferritin - Iron, TIBC and Ferritin Panel  6. Hypercalcemia - PTH, Intact and Calcium  7. Mixed hyperlipidemia - Comprehensive metabolic panel - Lipid panel  8. Vitamin D deficiency - Vitamin D, 25-hydroxy  9. Current use of long term anticoagulation - CBC  Other orders - fluticasone (FLONASE) 50 MCG/ACT nasal spray; Place 1 spray into both nostrils 2 (two) times daily. - nystatin cream (MYCOSTATIN); Apply 1 application topically 2 (two) times daily.

## 2018-11-23 NOTE — Telephone Encounter (Signed)
Xarelto 20mg  refill request received. Pt is 68 years old, weight-139.1kg, Crea-0.81 on 11/21/2017 and had labs drawn at her PCP office today and results are pending, last seen by Estella Husk on 11/15/2017 and has an appt with Dr. Johnsie Cancel scheduled on 11/27/2018, Diagnosis-DVT, CrCl-141.59ml/min; Dose is appropriate based on dosing criteria. Will send in refill to requested pharmacy.

## 2018-11-23 NOTE — Patient Instructions (Addendum)
   If you have lab work done today you will be contacted with your lab results within the next 2 weeks.  If you have not heard from us then please contact us. The fastest way to get your results is to register for My Chart.   IF you received an x-ray today, you will receive an invoice from [facility]. Please contact [facility] at [phone] with questions or concerns regarding your invoice.   IF you received labwork today, you will receive an invoice from [facility]. Please contact [facility] at [phone] with questions or concerns regarding your invoice.   Our billing staff will not be able to assist you with questions regarding bills from these companies.  You will be contacted with the lab results as soon as they are available. The fastest way to get your results is to activate your My Chart account. Instructions are located on the last page of this paperwork. If you have not heard from us regarding the results in 2 weeks, please contact this office.     Preventive Care 65 Years and Older, Female Preventive care refers to lifestyle choices and visits with your health care provider that can promote health and wellness. This includes:  A yearly physical exam. This is also called an annual well check.  Regular dental and eye exams.  Immunizations.  Screening for certain conditions.  Healthy lifestyle choices, such as diet and exercise. What can I expect for my preventive care visit? Physical exam Your health care provider will check:  Height and weight. These may be used to calculate body mass index (BMI), which is a measurement that tells if you are at a healthy weight.  Heart rate and blood pressure.  Your skin for abnormal spots. Counseling Your health care provider may ask you questions about:  Alcohol, tobacco, and drug use.  Emotional well-being.  Home and relationship well-being.  Sexual activity.  Eating habits.  History of  falls.  Memory and ability to understand (cognition).  Work and work environment.  Pregnancy and menstrual history. What immunizations do I need?  Influenza (flu) vaccine  This is recommended every year. Tetanus, diphtheria, and pertussis (Tdap) vaccine  You may need a Td booster every 10 years. Varicella (chickenpox) vaccine  You may need this vaccine if you have not already been vaccinated. Zoster (shingles) vaccine  You may need this after age 60. Pneumococcal conjugate (PCV13) vaccine  One dose is recommended after age 65. Pneumococcal polysaccharide (PPSV23) vaccine  One dose is recommended after age 65. Measles, mumps, and rubella (MMR) vaccine  You may need at least one dose of MMR if you were born in 1957 or later. You may also need a second dose. Meningococcal conjugate (MenACWY) vaccine  You may need this if you have certain conditions. Hepatitis A vaccine  You may need this if you have certain conditions or if you travel or work in places where you may be exposed to hepatitis A. Hepatitis B vaccine  You may need this if you have certain conditions or if you travel or work in places where you may be exposed to hepatitis B. Haemophilus influenzae type b (Hib) vaccine  You may need this if you have certain conditions. You may receive vaccines as individual doses or as more than one vaccine together in one shot (combination vaccines). Talk with your health care provider about the risks and benefits of combination vaccines. What tests do I need? Blood tests  Lipid and cholesterol levels. These   may be checked every 5 years, or more frequently depending on your overall health.  Hepatitis C test.  Hepatitis B test. Screening  Lung cancer screening. You may have this screening every year starting at age 55 if you have a 30-pack-year history of smoking and currently smoke or have quit within the past 15 years.  Colorectal cancer screening. All adults should  have this screening starting at age 50 and continuing until age 75. Your health care provider may recommend screening at age 45 if you are at increased risk. You will have tests every 1-10 years, depending on your results and the type of screening test.  Diabetes screening. This is done by checking your blood sugar (glucose) after you have not eaten for a while (fasting). You may have this done every 1-3 years.  Mammogram. This may be done every 1-2 years. Talk with your health care provider about how often you should have regular mammograms.  BRCA-related cancer screening. This may be done if you have a family history of breast, ovarian, tubal, or peritoneal cancers. Other tests  Sexually transmitted disease (STD) testing.  Bone density scan. This is done to screen for osteoporosis. You may have this done starting at age 65. Follow these instructions at home: Eating and drinking  Eat a diet that includes fresh fruits and vegetables, whole grains, lean protein, and low-fat dairy products. Limit your intake of foods with high amounts of sugar, saturated fats, and salt.  Take vitamin and mineral supplements as recommended by your health care provider.  Do not drink alcohol if your health care provider tells you not to drink.  If you drink alcohol: ? Limit how much you have to 0-1 drink a day. ? Be aware of how much alcohol is in your drink. In the [place] one drink equals one 12 oz bottle of beer (355 mL), one 5 oz glass of wine (148 mL), or one 1 oz glass of hard liquor (44 mL). Lifestyle  Take daily care of your teeth and gums.  Stay active. Exercise for at least 30 minutes on 5 or more days each week.  Do not use any products that contain nicotine or tobacco, such as cigarettes, e-cigarettes, and chewing tobacco. If you need help quitting, ask your health care provider.  If you are sexually active, practice safe sex. Use a condom or other form of protection in order to prevent STIs  (sexually transmitted infections).  Talk with your health care provider about taking a low-dose aspirin or statin. What's next?  Go to your health care provider once a year for a well check visit.  Ask your health care provider how often you should have your eyes and teeth checked.  Stay up to date on all vaccines. This information is not intended to replace advice given to you by your health care provider. Make sure you discuss any questions you have with your health care provider. Document Released: 01/24/2015 Document Revised: 12/22/2017 Document Reviewed: 12/22/2017 Elsevier Patient Education  2020 Elsevier Inc.  

## 2018-11-24 LAB — CBC
Hematocrit: 41 % (ref 34.0–46.6)
Hemoglobin: 13.9 g/dL (ref 11.1–15.9)
MCH: 28.8 pg (ref 26.6–33.0)
MCHC: 33.9 g/dL (ref 31.5–35.7)
MCV: 85 fL (ref 79–97)
Platelets: 200 10*3/uL (ref 150–450)
RBC: 4.82 x10E6/uL (ref 3.77–5.28)
RDW: 13.7 % (ref 11.7–15.4)
WBC: 4.6 10*3/uL (ref 3.4–10.8)

## 2018-11-24 LAB — COMPREHENSIVE METABOLIC PANEL
ALT: 22 IU/L (ref 0–32)
AST: 21 IU/L (ref 0–40)
Albumin/Globulin Ratio: 1.8 (ref 1.2–2.2)
Albumin: 4.6 g/dL (ref 3.8–4.8)
Alkaline Phosphatase: 119 IU/L — ABNORMAL HIGH (ref 39–117)
BUN/Creatinine Ratio: 25 (ref 12–28)
BUN: 17 mg/dL (ref 8–27)
Bilirubin Total: 0.7 mg/dL (ref 0.0–1.2)
CO2: 22 mmol/L (ref 20–29)
Calcium: 10.4 mg/dL — ABNORMAL HIGH (ref 8.7–10.3)
Chloride: 103 mmol/L (ref 96–106)
Creatinine, Ser: 0.69 mg/dL (ref 0.57–1.00)
GFR calc Af Amer: 102 mL/min/{1.73_m2} (ref >59)
GFR calc non Af Amer: 88 mL/min/{1.73_m2} (ref >59)
Globulin, Total: 2.5 g/dL (ref 1.5–4.5)
Glucose: 104 mg/dL — ABNORMAL HIGH (ref 65–99)
Potassium: 3.9 mmol/L (ref 3.5–5.2)
Sodium: 139 mmol/L (ref 134–144)
Total Protein: 7.1 g/dL (ref 6.0–8.5)

## 2018-11-24 LAB — LIPID PANEL
Chol/HDL Ratio: 4.2 ratio (ref 0.0–4.4)
Cholesterol, Total: 181 mg/dL (ref 100–199)
HDL: 43 mg/dL (ref >39)
LDL Chol Calc (NIH): 90 mg/dL (ref 0–99)
Triglycerides: 287 mg/dL — ABNORMAL HIGH (ref 0–149)
VLDL Cholesterol Cal: 48 mg/dL — ABNORMAL HIGH (ref 5–40)

## 2018-11-24 LAB — PTH, INTACT AND CALCIUM: PTH: 22 pg/mL (ref 15–65)

## 2018-11-24 LAB — IRON,TIBC AND FERRITIN PANEL
Ferritin: 996 ng/mL — ABNORMAL HIGH (ref 15–150)
Iron Saturation: 16 % (ref 15–55)
Iron: 47 ug/dL (ref 27–139)
Total Iron Binding Capacity: 302 ug/dL (ref 250–450)
UIBC: 255 ug/dL (ref 118–369)

## 2018-11-24 LAB — VITAMIN D 25 HYDROXY (VIT D DEFICIENCY, FRACTURES): Vit D, 25-Hydroxy: 36.1 ng/mL (ref 30.0–100.0)

## 2018-11-24 NOTE — Addendum Note (Signed)
Addended by: Rutherford Guys on: 11/24/2018 12:35 PM   Modules accepted: Orders

## 2018-11-27 ENCOUNTER — Other Ambulatory Visit: Payer: Self-pay

## 2018-11-27 ENCOUNTER — Encounter: Payer: Self-pay | Admitting: *Deleted

## 2018-11-27 ENCOUNTER — Ambulatory Visit: Payer: Medicare Other | Admitting: Family Medicine

## 2018-11-27 ENCOUNTER — Ambulatory Visit: Payer: Medicare Other | Admitting: Cardiovascular Disease

## 2018-11-27 ENCOUNTER — Encounter: Payer: Self-pay | Admitting: Cardiovascular Disease

## 2018-11-27 VITALS — BP 128/70 | HR 83 | Ht 67.0 in | Wt 306.0 lb

## 2018-11-27 DIAGNOSIS — I10 Essential (primary) hypertension: Secondary | ICD-10-CM

## 2018-11-27 NOTE — Patient Instructions (Signed)
Medication Instructions:   *If you need a refill on your cardiac medications before your next appointment, please call your pharmacy*  Lab Work:  If you have labs (blood work) drawn today and your tests are completely normal, you will receive your results only by: Marland Kitchen MyChart Message (if you have MyChart) OR . A paper copy in the mail If you have any lab test that is abnormal or we need to change your treatment, we will call you to review the results.  Testing/Procedures: None ordered today.  Follow-Up: At [facility], you and your health needs are our priority.  As part of our continuing mission to provide you with exceptional heart care, we have created designated Provider Care Teams.  These Care Teams include your primary Cardiologist (physician) and Advanced Practice Providers (APPs -  Physician Assistants and Nurse Practitioners) who all work together to provide you with the care you need, when you need it.  Your next appointment:   as needed  The format for your next appointment:   In Person  Provider:   You may see Jenkins Rouge, MD or one of the following Advanced Practice Providers on your designated Care Team:    Truitt Merle, NP  Cecilie Kicks, NP  Kathyrn Drown, NP

## 2019-01-15 ENCOUNTER — Telehealth (INDEPENDENT_AMBULATORY_CARE_PROVIDER_SITE_OTHER): Payer: Medicare PPO | Admitting: Neurology

## 2019-01-15 ENCOUNTER — Encounter: Payer: Self-pay | Admitting: Neurology

## 2019-01-15 DIAGNOSIS — E662 Morbid (severe) obesity with alveolar hypoventilation: Secondary | ICD-10-CM

## 2019-01-15 DIAGNOSIS — Z86718 Personal history of other venous thrombosis and embolism: Secondary | ICD-10-CM

## 2019-01-15 DIAGNOSIS — Z8543 Personal history of malignant neoplasm of ovary: Secondary | ICD-10-CM

## 2019-01-15 DIAGNOSIS — G4730 Sleep apnea, unspecified: Secondary | ICD-10-CM

## 2019-01-15 DIAGNOSIS — Z6841 Body Mass Index (BMI) 40.0 and over, adult: Secondary | ICD-10-CM

## 2019-01-15 DIAGNOSIS — G4733 Obstructive sleep apnea (adult) (pediatric): Secondary | ICD-10-CM

## 2019-01-15 NOTE — Progress Notes (Signed)
[facility]  Provider:  Dr [name] Referring Provider: Shawnee Knapp, MD Primary Care Physician:  Kathleen Guys, MD    HPI:  [name] is a 69 y.o. female  here for CPAP compliance, seen in a virtual visit on         01/15/19 , [name] has rented a new CPAP machine and 2 days compliance visit is also needed to allow her to purchase her machine all the right.  She is now using an air sense auto sense with 8 cm water pressure to centimeter EPR and a residual AHI of only 1.3/h.  She has been 100% compliant with an average use at time of 7 hours 28 minutes.  Her risk factors for Covid are increased as she is 69 years old, her BMI is over 35, and she has obstructive sleep apnea with a significant amount of sleep hypoxemia.  We discussed today that she should go through the website of the [place] and register for vaccine, as she is likely in group 2 a or 2 be.   She informed me that she has a new health insurance and new [employer] plan and wants to make sure that our visits and her CPAP supplies are properly billed.    I have the pleasure of meeting today with [name]  a 69 year old [name] right-handed female who has been followed for severe sleep apnea.  Over the last years the patient has always been 100% compliance as she is today.  She has used the machine every day on average 6 hours 58 minutes, the CPAP machine is set at 8 cmH2O with a full-time expiratory pressure relief of 2 cmH2O the residual AHI is 0.5 apneas per hour.  This is an excellent resolution, she does have mild air leakage, she does not have central apneas emerging under treatment. FSS was 15 points, Epworth  3 points GDS 0/15 points. The patient likes her CPAP and feels more energized since using it, she has ben able to lose weight - portion control and aqua gym exercises. She had PT directed exercises.     Routine yearly revisit and this [name]  presents again as 100% compliant CPAP user with a rate of 7 hours and 21 minutes. The use of CPAP pressure of 8 cm water is 2 cm in ER, and the total AHI of 0.8. No changes have to be made to have significant air leaks and a very happy with her usage. She endorsed the Epworth sleepiness score at 4 points and the fatigue severity score at 10 points all indicated that CPAP therapy is effective. [name] also wanted to inquire about her travel friendly her CPAP. Her machine was prescribed at the year 2013 nor 2014, and at this time is still working. She is interested in a trouble friendly a light weight machine. This should be her secondary machine however she couldn't keep her current one at home and use a trouble machine for all other purposes. I did show her the model by [employer] and there is a very nice model by [employer]	[place] as well.   Review of Systems:Out of a complete 14 system review, the patient complains of only the following symptoms, and all other reviewed systems are negative.   2014 . [name] ,a 69 year old, [name], right-handed female patient of Dr.Daub's is followed here for a sleep apnea.  The patient underwent a split study on 9-21 2012, after complaining  of excessive daytime fatigue, sleepiness, witnessed apneas and fragmented sleep at night she also was easily short of breath. The patient at the past medical history of morbid obesity, hypertension and hyperlipidemia. At the time of her sleep study her BMI was 46.7 and neck circumference 17 inches her blood pressure was also slightly elevated 141/81 mm Hg. The patient had been on Coumadin for DVT at the time. She tested positive for an AHI of 120 and had to be immediately titrated to a pressure of 9 cm water was reduced for AHI to 0.0 he also was able to enter REM sleep. There was borderline hypoxia noted with hypoventilation and periodic limb movements were also very frequent at night. In February by 2013 she followed up  90  days download- and was using CPAP nightly , had not gained weight and a download revealed a residual AHI on CPAP of 8 cm water of 1.6 . She has meanwhile tried to further was weight especially since he also has had pain and pain due to arthritis. A download from 4-17 -2013 showed an AHI of 1.1, average daily usage of 6 hours 56 minutes set pressure of 8 cm water with a 2 cm EPR. Today her Epworth sleepiness score is 4 points her fatigue score 25 and the patient has continued to use compliantly her CPAP. We were unable to obtain a download in the office today. The patient reports her normal sleep time is from 12:30 midnight to 7:30 AM, she normally falls asleep within 5 minutes. She has no longer nocturia but had nocturia before starting CPAP. She averages about 7 hours of sleep at night. When she wakes up she feels restored, she still takes one nap daily but after lunch about the duration of 30-45 minutes. She reports laughingly that her whole family naps !The nap is perceived as refreshing. Does not recall any dreams or nightmares or any dreams activity sleepwalking or sleep talking. She does not snore through the machine. She retired since last visit and is free to nap now. She is losing weight, about 6 pounds a month.       Social History   Socioeconomic History  . Marital status: Single    Spouse name: Not on file  . Number of children: 0  . Years of education: masters  . Highest education level: Master's degree (e.g., MA, MS, MEng, MEd, MSW, MBA)  Occupational History  . Occupation: Clinical research associate: [employer]    Comment: fifth grade (retired)  Tobacco Use  . Smoking status: Never Smoker  . Smokeless tobacco: Never Used  . Tobacco comment: only for 6 months in the 70s  Substance and Sexual Activity  . Alcohol use: Yes    Alcohol/week: 0.0 standard drinks    Comment: rare, 2 yearly  . Drug use: Never  . Sexual activity: Not Currently    Partners: Female    Birth  control/protection: Surgical  Other Topics Concern  . 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 PSH showed an AHI of 120!!!! titrated to 8 cm watr cPAP , residual AHI of 1 and downlaod was done  01-23-11 .  excellent compliance - 7 hours and low leak,  nasal pillow mask.    BMI is considered morbidly obese.  discussed low carb  diet - weight watchers and restricted exercise. , aquatic .    Patient cannot exercise due to  soreness in proximal muscles.and  would like to add coenzyme q 10 and carnitine . She needs a medical weight loss regimen - bariatric surgery?       FSS 48, Epworth 4 again ,  CMS compliant  residual AHI 1.1 and average use 6.48  hours , no naps    Patient is single and lives alone.   Patient does not have any children.   Patient is retired.   Patient has a Master's degree.   Patient is left-handed.   Patient drinks tea occasionally.             Social Determinants of Health   Financial Resource Strain:   . Difficulty of Paying Living Expenses: Not on file  Food Insecurity:   . Worried About Charity fundraiser in the Last Year: Not on file  . Ran Out of Food in the Last Year: Not on file  Transportation Needs:   . Lack of Transportation (Medical): Not on file  . Lack of Transportation (Non-Medical): Not on file  Physical Activity:   . Days of Exercise per Week: Not on file  . Minutes of Exercise per Session: Not on file  Stress:   . Feeling of Stress : Not on file  Social Connections:   . Frequency of Communication with Friends and Family: Not on file  . Frequency of Social Gatherings with Friends and Family: Not on file  . Attends Religious Services: Not on file  . Active Member of Clubs or Organizations: Not on file  . Attends Archivist Meetings: Not on file  . Marital Status: Not on file  Intimate Partner Violence:   . Fear of Current or Ex-Partner: Not on file  . Emotionally Abused: Not  on file  . Physically Abused: Not on file  . Sexually Abused: Not on file    Family History  Problem Relation Age of Onset  . Alzheimer's disease Mother   . Hypertension Mother   . Alzheimer's disease Father   . Hypertension Father   . Colon cancer Father 76       deceased 67  . Hypertension Sister   . Breast cancer Sister 57  . Hypertension Brother   . Prostate cancer Brother 29  . Prostate cancer Other        father's maternal half-brother  . Breast cancer Other 73       daughter of brother; currently 56    Past Medical History:  Diagnosis Date  . Anemia    oral iron occ.  . Arthritis   . Basal cell carcinoma of nose   . Bronchitis   . Bursitis of left hip   . 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 Carpal tunnel syndrome    Right hand  . Degenerative joint disease of both hips 2019  . Diverticulitis    past history  . DVT (deep vein thrombosis) in pregnancy    LEFT LEG  . Family history of breast cancer   . 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 FH: genetic disease carrier 11/05/2016  . Genetic testing 11/04/2016   Multi-Cancer panel (83 genes) @ [facility] - see report; special interpretation  . History of ovarian cancer   . HTN (hypertension)   . Hyperlipidemia   . OSA (obstructive sleep apnea)    hypoventilation, on CPAP  . Osteopenia 2019  . Shingles  Past Surgical History:  Procedure Laterality Date  . BASAL CELL CARCINOMA EXCISION     resection with plastic surgery reconstrustion  . bilateral breast mass  1970   benigned, left breast  . cataract surgery Bilateral [id number], 2005   with bilateral lens replacements  . COLONOSCOPY WITH PROPOFOL N/A 05/21/2014   Procedure: COLONOSCOPY WITH PROPOFOL;  Surgeon: Juanita Craver, MD;  Location: [facility];  Service: Endoscopy;  Laterality: N/A;  . HERNIA REPAIR  4562   umbilical repair with mesh   . TOTAL ABDOMINAL  HYSTERECTOMY W/ BILATERAL SALPINGOOPHORECTOMY  2005   Stage IA ovarian cancer    Current Outpatient Medications  Medication Sig Dispense Refill  . ACETAMINOPHEN PO Take 1,300 mg by mouth 3 (three) times daily.     Marland Kitchen atorvastatin (LIPITOR) 40 MG tablet Take 1 tablet (40 mg total) by mouth daily. Please schedule appt for future refills. 1st attempt. 90 tablet 0  . Calcium Carb-Cholecalciferol (CALCIUM 600 + D PO) Take 1 tablet daily by mouth.    . Cholecalciferol (VITAMIN D3) 50 MCG (2000 UT) capsule daily.    . EUFLEXXA 20 MG/2ML SOSY     . fish oil-omega-3 fatty acids 1000 MG capsule Take 1-2 g by mouth 2 (two) times daily.     . fluticasone (FLONASE) 50 MCG/ACT nasal spray Place 1 spray into both nostrils 2 (two) times daily. 48 g 3  . furosemide (LASIX) 20 MG tablet Take 1 tablet (20 mg total) by mouth daily. Please schedule appt for future refills. 1st attempt 90 tablet 0  . lisinopril (ZESTRIL) 40 MG tablet TAKE 1 TABLET BY MOUTH EVERY DAY 90 tablet 0  . Multiple Vitamin (MULTIVITAMIN) tablet Take 1 tablet by mouth every morning.     . niacin 500 MG tablet Take 500 mg by mouth every evening.     . nystatin cream (MYCOSTATIN) Apply 1 application topically 2 (two) times daily. 30 g 4  . XARELTO 20 MG TABS tablet TAKE 1 TABLET BY MOUTH EVERY DAY 90 tablet 1   No current facility-administered medications for this visit.    Allergies as of 01/15/2019 - Review Complete 11/27/2018  Allergen Reaction Noted  . Valtrex [valacyclovir hcl] Swelling and Rash 10/20/2011  . Benoxinate base  12/04/2010  . Sulfonamide derivatives  07/18/2008  . Ivp dye [iodinated diagnostic agents] Rash 12/04/2010    Vitals: LMP 01/12/2003  Last Weight:  Wt Readings from Last 1 Encounters:  11/27/18 (!) 306 lb (138.8 kg)   Last Height:   Ht Readings from Last 1 Encounters:  11/27/18 '5\' 7"'  (1.702 m)   Observation. The patient is alert and oriented, fluent in speech, no hearing loss, normal facial  movements, eye movements and reports no loss of smell or taste.   [name] was able to hold her breath for 22 seconds after air intake by nasal passage.  There was no evidence of nasal congestion, and a 22-second breath-holding spell would be considered normal for age in respect to lung capacity.   Assessment:   1) After physical and neurologic examination, review of pre-existing records, assessment  :obstructive sleep apnea with obesity- hypoventilation overlap. COVID risk level 4 - Severe AHI of 120 in the patient baseline study completely corrected of only 8 cm of water pressure and 2 cm water EPR- no additional oxygen was prescribed the residual AHI was 1.3/h    Travel CPAP was not covered by insurance- she is interested in an adapter to a car battery for times  of power loss.  2 )Very high body mass index. Is encouraged to hydrate well, and to keep her vitamin K intake in mind, as she continues to be on warfarin. She is now swimming more regularly lost weight by portion control.     Larey Seat, MD   Cc; Dr. Delman Cheadle

## 2019-01-15 NOTE — Patient Instructions (Addendum)
[employer] insurance will be new provider-  Mrs. Lusky, just make sure to please remind our billing department of changing the membership/ coverage.   I will send an order to [facility] DME about your compliance with/ to CPAP therapy.    Happy, healthy new year 2021. !

## 2019-01-18 ENCOUNTER — Other Ambulatory Visit: Payer: Self-pay | Admitting: Cardiovascular Disease

## 2019-02-04 ENCOUNTER — Other Ambulatory Visit: Payer: Self-pay | Admitting: Physician Assistant

## 2019-02-19 ENCOUNTER — Encounter: Payer: Self-pay | Admitting: Emergency Medicine

## 2019-02-19 ENCOUNTER — Telehealth: Payer: Self-pay | Admitting: Emergency Medicine

## 2019-02-19 NOTE — Telephone Encounter (Signed)
Patient is requesting documentation from Dr. Matilde Haymaker patient to get vaccine due to her being on blood thinner. Patient would like a callback once letter is ready. Please advise

## 2019-02-19 NOTE — Telephone Encounter (Signed)
Left a msg on patients machine that letter is ready for pick up at front desk

## 2019-02-19 NOTE — Telephone Encounter (Signed)
Please write a letter stating ok to get covid vaccine while she is on xeralto. Per most recent CDC guidelines (Jan 16 2019)  "Do you have a bleeding disorder or are you taking a blood thinner? As with all vaccines, COVID-19 vaccine may be given to these patients, if a physician familiar with the patient's bleeding risk determines that the vaccine can be administered intramuscularly with reasonable safety. ACIP recommends the following technique for intramuscular vaccination in patients with bleeding disorders or taking blood thinners: A fine-gauge needle (23-gauge or smaller caliber) should be used for the vaccination, followed by firm pressure on the site, without rubbing, for at least 2 minutes." Thanks

## 2019-02-21 ENCOUNTER — Other Ambulatory Visit: Payer: Self-pay | Admitting: Family Medicine

## 2019-02-21 NOTE — Telephone Encounter (Signed)
Pt requesting Dr. Pamella Pert to start filling atorvastatin (LIPITOR) 40 MG tablet  Please advise.

## 2019-02-21 NOTE — Telephone Encounter (Signed)
Requested medication (s) are due for refill today yes  Requested medication (s) are on the active medication list yes  Future visit scheduled yes  Last refill: 09/27/18  Notes to clinic: Patient is requesting PCP prescribe Rx   Requested Prescriptions  Pending Prescriptions Disp Refills   atorvastatin (LIPITOR) 40 MG tablet 90 tablet 0    Sig: Take 1 tablet (40 mg total) by mouth daily. Please schedule appt for future refills. 1st attempt.      Cardiovascular:  Antilipid - Statins Failed - 02/21/2019  3:34 PM      Failed - LDL in normal range and within 360 days    LDL Chol Calc (NIH)  Date Value Ref Range Status  11/23/2018 90 0 - 99 mg/dL Final          Failed - Triglycerides in normal range and within 360 days    Triglycerides  Date Value Ref Range Status  11/23/2018 287 (H) 0 - 149 mg/dL Final          Passed - Total Cholesterol in normal range and within 360 days    Cholesterol, Total  Date Value Ref Range Status  11/23/2018 181 100 - 199 mg/dL Final          Passed - HDL in normal range and within 360 days    HDL  Date Value Ref Range Status  11/23/2018 43 >39 mg/dL Final          Passed - Patient is not pregnant      Passed - Valid encounter within last 12 months    Recent Outpatient Visits           3 months ago Medicare annual wellness visit, subsequent   [facility], Lilia Argue, MD   1 year ago Medicare annual wellness visit, subsequent   [facility], Laurey Arrow, MD   1 year ago Visit for suture removal   [facility] at [facility], Audrie Lia, PA-C   1 year ago Sebaceous cyst   [facility], Audrie Lia, PA-C   1 year ago Sebaceous cyst   [facility], Audrie Lia, PA-C       Future Appointments             In 9 months Rutherford Guys, MD Primary Care at [place], [facility]                Requested Prescriptions  Pending Prescriptions Disp Refills   atorvastatin (LIPITOR) 40 MG  tablet 90 tablet 0    Sig: Take 1 tablet (40 mg total) by mouth daily. Please schedule appt for future refills. 1st attempt.      Cardiovascular:  Antilipid - Statins Failed - 02/21/2019  3:34 PM      Failed - LDL in normal range and within 360 days    LDL Chol Calc (NIH)  Date Value Ref Range Status  11/23/2018 90 0 - 99 mg/dL Final          Failed - Triglycerides in normal range and within 360 days    Triglycerides  Date Value Ref Range Status  11/23/2018 287 (H) 0 - 149 mg/dL Final          Passed - Total Cholesterol in normal range and within 360 days    Cholesterol, Total  Date Value Ref Range Status  11/23/2018 181 100 - 199 mg/dL Final  Passed - HDL in normal range and within 360 days    HDL  Date Value Ref Range Status  11/23/2018 43 >39 mg/dL Final          Passed - Patient is not pregnant      Passed - Valid encounter within last 12 months    Recent Outpatient Visits           3 months ago Medicare annual wellness visit, subsequent   [facility], Lilia Argue, MD   1 year ago Medicare annual wellness visit, subsequent   [facility], Laurey Arrow, MD   1 year ago Visit for suture removal   Primary Care at [place], PA-C   1 year ago Sebaceous cyst   Primary Care at [place], PA-C   1 year ago Sebaceous cyst   Primary Care at [place], PA-C       Future Appointments             In 9 months Rutherford Guys, MD Primary Care at [place], [facility]

## 2019-02-21 NOTE — Telephone Encounter (Signed)
Please advise on medication refill.  

## 2019-02-21 NOTE — Addendum Note (Signed)
Addended by: Veneda Melter on: 02/21/2019 04:58 PM   Modules accepted: Orders

## 2019-02-22 MED ORDER — ATORVASTATIN CALCIUM 40 MG PO TABS: 40.0000 mg | ORAL_TABLET | Freq: Every day | ORAL | 3 refills | Status: DC

## 2019-03-30 ENCOUNTER — Encounter: Payer: Self-pay | Admitting: Neurology

## 2019-04-11 NOTE — Progress Notes (Addendum)
69 y.o. G0P0 Single White or [name] female here for annual exam.  Doing well.  Denies vaginal bleeding.  Having continued issues with walking.  Having a lot of pain.  Using tylenol arthritis daily.  This doesn't really help.  Patient's last menstrual period was 01/12/2003.          Sexually active: No.  The current method of family planning is status post hysterectomy.    Exercising: No.  exercise Smoker:  no  Health Maintenance: Pap:  2005 normal History of abnormal Pap:  yes MMG:  11-18-2018 category b density birads 2:neg Colonoscopy:  05-21-14 polyp f/u 63yr BMD:   2019 osteopenia.  She would like to repeat this with MMG in 2021 TDaP:  2016 Pneumonia vaccine(s):  2016 Shingrix: had done in 2019 Hep C testing: 2016 neg Screening Labs: 11/2018   reports that she has never smoked. She has never used smokeless tobacco. She reports current alcohol use. She reports that she does not use drugs.  Past Medical History:  Diagnosis Date  . Anemia    oral iron occ.  . Arthritis   . Basal cell carcinoma of nose   . Bronchitis   . Bursitis of left hip   . [place]   Stage 1A, BRCA 1/2 neg 6/14  . Carpal tunnel syndrome    Right hand  . Degenerative joint disease of both hips 2019  . Diverticulitis    past history  . DVT (deep vein thrombosis) in pregnancy    LEFT LEG  . Family history of breast cancer   . [place]oral D[place]   -"wears compression hose all times" left leg  . FH: genetic disease carrier 11/05/2016  . Genetic testing 11/04/2016   Multi-Cancer panel (83 genes) @ [facility] - see report; special interpretation  . History of ovarian cancer   . HTN (hypertension)   . Hyperlipidemia   . OSA (obstructive sleep apnea)    hypoventilation, on CPAP  . Osteopenia 2019  . Shingles     Past Surgical History:  Procedure Laterality Date  . BASAL CELL CARCINOMA EXCISION     resection with plastic surgery reconstrustion  .  bilateral breast mass  1970   benigned, left breast  . cataract surgery Bilateral [id number] 2005   with bilateral lens replacements  . COLONOSCOPY WITH PROPOFOL N/A 05/21/2014   Procedure: COLONOSCOPY WITH PROPOFOL;  Surgeon: JJuanita Craver MD;  Location: [facility];  Service: Endoscopy;  Laterality: N/A;  . HERNIA REPAIR  [id number]  umbilical repair with mesh   . TOTAL ABDOMINAL HYSTERECTOMY W/ BILATERAL SALPINGOOPHORECTOMY  2005   Stage IA ovarian cancer    Current Outpatient Medications  Medication Sig Dispense Refill  . ACETAMINOPHEN PO Take 1,300 mg by mouth 3 (three) times daily.     .Marland Kitchenatorvastatin (LIPITOR) 40 MG tablet Take 1 tablet (40 mg total) by mouth daily. Please schedule appt for future refills. 1st attempt. 90 tablet 3  . Calcium Carb-Cholecalciferol (CALCIUM 600 + D PO) Take 1 tablet daily by mouth.    . Cholecalciferol (VITAMIN D3) 50 MCG (2000 UT) capsule daily.    . EUFLEXXA 20 MG/2ML SOSY     . fish oil-omega-3 fatty acids 1000 MG capsule Take 1-2 g by mouth 2 (two) times daily.     . fluticasone (FLONASE) 50 MCG/ACT nasal spray Place 1 spray into both nostrils 2 (two) times daily. 48 g 3  . furosemide (LASIX) 20  MG tablet TAKE 1 TABLET BY MOUTH EVERY DAY 90 tablet 3  . lisinopril (ZESTRIL) 40 MG tablet TAKE 1 TABLET BY MOUTH EVERY DAY 90 tablet 3  . Multiple Vitamin (MULTIVITAMIN) tablet Take 1 tablet by mouth every morning.     . niacin 500 MG tablet Take 500 mg by mouth every evening.     . nystatin cream (MYCOSTATIN) Apply 1 application topically 2 (two) times daily. 30 g 4  . XARELTO 20 MG TABS tablet TAKE 1 TABLET BY MOUTH EVERY DAY 90 tablet 1   No current facility-administered medications for this visit.    Family History  Problem Relation Age of Onset  . Alzheimer's disease Mother   . Hypertension Mother   . Alzheimer's disease Father   . Hypertension Father   . Colon cancer Father 46       deceased 78  . Hypertension Sister   . Breast cancer Sister  55  . Hypertension Brother   . Prostate cancer Brother 42  . Prostate cancer Other        father's maternal half-brother  . Breast cancer Other 57       daughter of brother; currently 38    Review of Systems  Constitutional: Negative.   HENT: Negative.   Eyes: Negative.   Respiratory: Negative.   Cardiovascular: Negative.   Gastrointestinal: Negative.   Endocrine: Negative.   Genitourinary: Negative.   Musculoskeletal: Negative.   Skin:       Rash under breast & in folds around groin area  Allergic/Immunologic: Negative.   Neurological: Negative.   Psychiatric/Behavioral: Negative.     Exam:   LMP 01/12/2003   Height:      Ht Readings from Last 3 Encounters:  11/27/18 '5\' 7"'  (1.702 m)  11/23/18 '5\' 7"'  (1.702 m)  03/28/18 '5\' 7"'  (1.702 m)    General appearance: alert, cooperative and appears stated age Head: Normocephalic, without obvious abnormality, atraumatic Neck: no adenopathy, supple, symmetrical, trachea midline and thyroid normal to inspection and palpation Lungs: clear to auscultation bilaterally Breasts: normal appearance, no masses or tenderness Heart: regular rate and rhythm Abdomen: soft, non-tender; bowel sounds normal; no masses,  no organomegaly Extremities: extremities normal, atraumatic, no cyanosis or edema Skin: Skin color, texture, turgor normal. No rashes or lesions Lymph nodes: Cervical, supraclavicular, and axillary nodes normal. No abnormal inguinal nodes palpated Neurologic: Grossly normal   Pelvic: External genitalia:  no lesions              Urethra:  normal appearing urethra with no masses, tenderness or lesions              Bartholins and Skenes: normal                 Vagina: normal appearing vagina with normal color and discharge, no lesions              Cervix: absent              Pap taken: No. Bimanual Exam:  Uterus:  uterus absent              Adnexa: no mass, fullness, tenderness               Rectovaginal: Confirms                Anus:  normal sphincter tone, no lesions  Chaperone, Terence Lux, CMA, was present for exam.2 A:  Well Woman with normal exam H/o ovarian cancer, Stage IA.  S/p TAH/BSO 5/05. H/o extensive left LE CT, on Xarelto Obesity Family hx of breast cancer in niece and sister.  TCR done with pt previously 24.7% Elevated ferritin, has checked with Dr. Marin Olp who does not feel this is hemochromotosis  P:   Mammogram guidelines reviewed.  Yearly breast MRI and MMG has been recommended.  Pt does want to have breast MRI at some point but is currently waiting until Covid is improved pap smear not indicated Ca-125 obtained  Lab work done with Dr. Pamella Pert Return annually or prn

## 2019-04-16 ENCOUNTER — Other Ambulatory Visit: Payer: Self-pay

## 2019-04-17 ENCOUNTER — Other Ambulatory Visit: Payer: Self-pay

## 2019-04-17 ENCOUNTER — Encounter: Payer: Self-pay | Admitting: Obstetrics & Gynecology

## 2019-04-17 ENCOUNTER — Ambulatory Visit (INDEPENDENT_AMBULATORY_CARE_PROVIDER_SITE_OTHER): Payer: Medicare PPO | Admitting: Obstetrics & Gynecology

## 2019-04-17 VITALS — BP 126/82 | HR 70 | Temp 97.7°F | Resp 16 | Ht 66.5 in | Wt 309.0 lb

## 2019-04-17 DIAGNOSIS — Z8543 Personal history of malignant neoplasm of ovary: Secondary | ICD-10-CM

## 2019-04-17 DIAGNOSIS — Z01419 Encounter for gynecological examination (general) (routine) without abnormal findings: Secondary | ICD-10-CM | POA: Diagnosis not present

## 2019-04-18 LAB — CA 125: Cancer Antigen (CA) 125: 6.2 U/mL (ref 0.0–38.1)

## 2019-05-23 ENCOUNTER — Other Ambulatory Visit: Payer: Self-pay | Admitting: Family Medicine

## 2019-05-23 NOTE — Telephone Encounter (Signed)
Requested medication (s) are due for refill today: yes  Requested medication (s) are on the active medication list: yes  Last refill: 05/03/19  Future visit scheduled: yes  Notes to clinic:  script per Dr Jenkins Rouge    Requested Prescriptions  Pending Prescriptions Disp Refills   XARELTO 20 MG TABS tablet [Pharmacy Med Name: XARELTO 20 MG TABLET] 90 tablet 1    Sig: TAKE 1 TABLET BY MOUTH EVERY DAY      There is no refill protocol information for this order

## 2019-11-07 ENCOUNTER — Encounter: Payer: Self-pay | Admitting: Obstetrics & Gynecology

## 2019-11-08 ENCOUNTER — Other Ambulatory Visit: Payer: Self-pay

## 2019-11-08 ENCOUNTER — Telehealth: Payer: Self-pay

## 2019-11-08 ENCOUNTER — Encounter: Payer: Self-pay | Admitting: Family Medicine

## 2019-11-08 ENCOUNTER — Ambulatory Visit: Payer: Medicare PPO | Admitting: Family Medicine

## 2019-11-08 VITALS — BP 129/82 | HR 86 | Temp 97.9°F | Ht 66.5 in | Wt 289.2 lb

## 2019-11-08 DIAGNOSIS — Z1211 Encounter for screening for malignant neoplasm of colon: Secondary | ICD-10-CM | POA: Diagnosis not present

## 2019-11-08 DIAGNOSIS — E782 Mixed hyperlipidemia: Secondary | ICD-10-CM | POA: Diagnosis not present

## 2019-11-08 DIAGNOSIS — Z9989 Dependence on other enabling machines and devices: Secondary | ICD-10-CM

## 2019-11-08 DIAGNOSIS — L708 Other acne: Secondary | ICD-10-CM

## 2019-11-08 DIAGNOSIS — R269 Unspecified abnormalities of gait and mobility: Secondary | ICD-10-CM

## 2019-11-08 DIAGNOSIS — G4733 Obstructive sleep apnea (adult) (pediatric): Secondary | ICD-10-CM

## 2019-11-08 MED ORDER — METRONIDAZOLE 0.75 % EX CREA: TOPICAL_CREAM | Freq: Two times a day (BID) | CUTANEOUS | 0 refills | Status: DC

## 2019-11-08 NOTE — Progress Notes (Signed)
Patient ID: [name], female    DOB: 1950/05/14  Age: 68 y.o. MRN: [id number]  Chief Complaint  Patient presents with  . Referral    using canes and crutch for mobility, does not use walker. Referral request to undo damage that has been done. Wants to talk about the discontinuing of calcium and need of dexa scan.  Marland Kitchen Health Maintenance    will do colonoscopy when mobility issue is corrected. Nx mammo is schedule for 2022. Pt is due for mammo 11/19/2019. Will fix error    Subjective:   Patient is here regarding her mobility she cannot get around very well anymore at all.  She tries to use her cane but it does not work well for her.  She used to go to water aerobics, but has not done so since Covid.  She also used to get some physical therapy but has not done so since Covid.  She has succeeded in recently losing a few pounds.  She apparently is scheduled for a physical next month.  She wanted somebody to look at the whole picture and see what could be offered her.  Current allergies, medications, problem list, past/family and social histories reviewed.  Objective:  BP 129/82   Pulse 86   Temp 97.9 F (36.6 C)   Ht 5' 6.5" (1.689 m)   Wt 289 lb 3.2 oz (131.2 kg)   LMP 01/12/2003   SpO2 96%   BMI 45.98 kg/m   Reviewed the chart extensively but did not do a physical exam on her today due to the multiple long discussions regarding her overall health.  Assessment & Plan:   Assessment: 1. Gait disturbance   2. Special screening for malignant neoplasms, colon   3. Morbid obesity ([place])   4. Mixed hyperlipidemia   5. OSA on CPAP   6. Other acne       Plan: Many things enter into her immobility.  Her obesity is a biggest factor, and we discussed that the.  She wanted to talk about whether she should get a colonoscopy done, and I feel like at her age and having had 1 5 years ago which did have a few small polyps but nothing severe stage, she should just get screening with  Cologuard.  I told her that even after being on the statins for several years, they could contribute to body aches and I thought she should try off them for a month or so and see if that would help her aching and mobility.  She wanted some metronidazole for her face so that was prescribed.  I wonder if a physiatrist could help her, and therefore made a referral for her.  I advised her to get back to the physical activities despite the Covid.  Encourage Covid booster.  Orders Placed This Encounter  Procedures  . Cologuard  . Ambulatory referral to Physical Therapy    Referral Priority:   Routine    Referral Type:   Physical Medicine    Referral Reason:   Specialty Services Required    Requested Specialty:   Physical Therapy    Number of Visits Requested:   1    Meds ordered this encounter  Medications  . metroNIDAZOLE (METROCREAM) 0.75 % cream    Sig: Apply topically 2 (two) times daily.    Dispense:  45 g    Refill:  0         Patient Instructions    Referral is being made to a  physical medicine therapist.  I do recommend you go ahead and get a bone density test done.  Suggest you stop the statin medication for about a month and see if it makes any difference in your aching and mobility.  Continue to work hard on eating less.  I recommend you return to your water aerobics and doing any physical therapy exercises you can.  I do not have any other suggestions regarding issues.  I think you are at risk if you get a colonoscopy, and I would recommend you get the Cologuard test done.  You can send that report on over to Dr. Collene Mares.  Return for your regular physical   If you have lab work done today you will be contacted with your lab results within the next 2 weeks.  If you have not heard from [place] then please contact us. The fastest way to get your results is to register for My Chart.   IF you received an x-ray today, you will receive an invoice from [facility].  Please contact [facility] at [phone] with questions or concerns regarding your invoice.   IF you received labwork today, you will receive an invoice from [facility]. Please contact [facility] at [phone] with questions or concerns regarding your invoice.   Our billing staff will not be able to assist you with questions regarding bills from these companies.  You will be contacted with the lab results as soon as they are available. The fastest way to get your results is to activate your My Chart account. Instructions are located on the last page of this paperwork. If you have not heard from [place] regarding the results in 2 weeks, please contact this office.        Return if symptoms worsen or fail to improve.   Ruben Reason, MD 11/08/2019

## 2019-11-08 NOTE — Telephone Encounter (Addendum)
Pt asserts that the mentioning of sexuality preference was mentioned in encounter note with pcp. She is asking that it be removed. Dr.Hopper and I did search using the search function using keywords such as gay or lesbian.There is no mentioning of this in notes of pt sexual status or does it specify pt status in sexual preference portion of the chart. Pt says she shared this info only due to certain testing she may have and thought that it may be important to mention. I have sent an e-mail to admin for further review.

## 2019-11-08 NOTE — Patient Instructions (Addendum)
  Referral is being made to a physical medicine therapist.  I do recommend you go ahead and get a bone density test done.  Suggest you stop the statin medication for about a month and see if it makes any difference in your aching and mobility.  Continue to work hard on eating less.  I recommend you return to your water aerobics and doing any physical therapy exercises you can.  I do not have any other suggestions regarding issues.  I think you are at risk if you get a colonoscopy, and I would recommend you get the Cologuard test done.  You can send that report on over to Dr. Collene Mares.  Return for your regular physical   If you have lab work done today you will be contacted with your lab results within the next 2 weeks.  If you have not heard from [place] then please contact us. The fastest way to get your results is to register for My Chart.   IF you received an x-ray today, you will receive an invoice from [facility]. Please contact [facility] at [phone] with questions or concerns regarding your invoice.   IF you received labwork today, you will receive an invoice from [facility]. Please contact [facility] at [phone] with questions or concerns regarding your invoice.   Our billing staff will not be able to assist you with questions regarding bills from these companies.  You will be contacted with the lab results as soon as they are available. The fastest way to get your results is to activate your My Chart account. Instructions are located on the last page of this paperwork. If you have not heard from [place] regarding the results in 2 weeks, please contact this office.

## 2019-11-26 ENCOUNTER — Telehealth: Payer: Self-pay | Admitting: Family Medicine

## 2019-11-26 NOTE — Telephone Encounter (Signed)
Pt called and stated she would like for Anitra to give her a call she had a couple things she would like to talk to her about. Pt would not disclose about what.  [phone] Please advise.

## 2019-11-27 ENCOUNTER — Telehealth: Payer: Self-pay

## 2019-11-27 ENCOUNTER — Other Ambulatory Visit: Payer: Self-pay

## 2019-11-27 MED ORDER — FLUTICASONE PROPIONATE 50 MCG/ACT NA SUSP: 1.0000 | Freq: Two times a day (BID) | NASAL | 3 refills | Status: DC

## 2019-11-27 NOTE — Telephone Encounter (Signed)
Left message for pt to call bk 

## 2019-11-27 NOTE — Telephone Encounter (Signed)
Pt called following on the mentioning is her chart about sexual preference. IT has been contacted in order to have chart amended. Looks as though this was auto populating error. tE-mail has been sent to management as well.

## 2019-12-03 ENCOUNTER — Ambulatory Visit: Payer: Medicare PPO

## 2019-12-13 ENCOUNTER — Encounter: Payer: Medicare Other | Admitting: Family Medicine

## 2019-12-14 LAB — COLOGUARD
COLOGUARD: NEGATIVE
Cologuard: NEGATIVE

## 2019-12-20 ENCOUNTER — Ambulatory Visit: Payer: Medicare PPO | Admitting: Family Medicine

## 2019-12-20 ENCOUNTER — Other Ambulatory Visit: Payer: Self-pay

## 2019-12-20 ENCOUNTER — Encounter: Payer: Self-pay | Admitting: Family Medicine

## 2019-12-20 VITALS — BP 142/72 | HR 87 | Temp 97.8°F | Resp 17 | Ht 66.5 in | Wt 287.0 lb

## 2019-12-20 DIAGNOSIS — G5601 Carpal tunnel syndrome, right upper limb: Secondary | ICD-10-CM

## 2019-12-20 DIAGNOSIS — Z7901 Long term (current) use of anticoagulants: Secondary | ICD-10-CM

## 2019-12-20 DIAGNOSIS — E782 Mixed hyperlipidemia: Secondary | ICD-10-CM

## 2019-12-20 DIAGNOSIS — Z0000 Encounter for general adult medical examination without abnormal findings: Secondary | ICD-10-CM

## 2019-12-20 DIAGNOSIS — R269 Unspecified abnormalities of gait and mobility: Secondary | ICD-10-CM | POA: Diagnosis not present

## 2019-12-20 DIAGNOSIS — Z8543 Personal history of malignant neoplasm of ovary: Secondary | ICD-10-CM

## 2019-12-20 DIAGNOSIS — E559 Vitamin D deficiency, unspecified: Secondary | ICD-10-CM

## 2019-12-20 DIAGNOSIS — M4802 Spinal stenosis, cervical region: Secondary | ICD-10-CM | POA: Insufficient documentation

## 2019-12-20 DIAGNOSIS — Z0001 Encounter for general adult medical examination with abnormal findings: Secondary | ICD-10-CM

## 2019-12-20 NOTE — Progress Notes (Signed)
Patient ID: [name], female    DOB: 01-30-1950  Age: 69 y.o. MRN: [id number]  Chief Complaint  Patient presents with  . Annual Exam    Pt doing well has some questions but     Subjective:   Annual physical examination:  Patient is here for an annual physical examination.  I saw her once before regarding gait problems.  She feels like she has gotten much weaker this fall and gone down hill on her ability to walk.  She did not ever get into a physiatrist, someone made her the referral to a PT instead.  She has never seen a neurosurgeon.  She has had a couple of MRIs in the past that showed some cervical and lumbar spine spinal stenosis.  She does not have any other major new, complaints.  She takes her medications regularly.  Past medical history: Operations: Breast benign mass at [place], hysterectomy for ovarian cancer in 2005, hernia repair 2008, eye surgery 4 times including Lasix and cataract surgeries, Mohs chemosurgery on her nose. 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  Physicians include Dr. Juliane Poot for, Dr. Collene Mares for her colon, Dr. Brett Fairy for sleep apnea, no longer has a cardiologist who asked her to see her primary, Dr. Demetrius Revel and Dr. Rip Harbour and Dr. Jacelyn Grip. She has a Pharmacist, community and eye d octor  Family history: Father died with Alzheime's and colon cancer at 80 Mother died with Alzheimer's at age 71 1 brother had metastatric cancer.  2 sisters living well.  Another one did have a breast cancer.  One has Parkinson's.  Social history: She works for [employer] she is retired she has lots of trouble with her walking and that limits her activity as does the Covid times.    Current allergies, medications, problem list, past/family and  social histories reviewed.  Objective:  BP (!) 142/72   Pulse 87   Temp 97.8 F (36.6 C) (Temporal)   Resp 17   Ht 5' 6.5" (1.689 m)   Wt 287 lb (130.2 kg)   LMP 01/12/2003   SpO2 96%   BMI 45.63 kg/m   Obese pleasant lady in no acute distress.  Alert and oriented.  TMs normal.  Eyes PERRL.  Throat clear.  Has had dental work done extensively.  Neck supple without nodes or thyromegaly.  No carotid bruits.  Chest is clear to auscultation.  Heart rate without murmurs, gallops, arrhythmias.  No carotid bruits.  Her ovarian cancer doctor does her breast exams.  She is getting a mammogram sometime soon.  M soft that mass or tenderness.  Lower midline scarring from old old surgery.  Pelvic not done.  Extremities unremarkable.  Wears compression hose.  Mild dusky color to her toes but she has on compression hose and the room is cold.  Pedal pulse palpable.  It is up and moves with great difficulty using a cane.  Has a splint on right wrist for carpal tunnel syndrome. Assessment & Plan:   Assessment: 1. Annual physical exam   2. Obesity, morbid ([place])   3. Spinal stenosis in cervical region   4. Gait disturbance   5. Vitamin D deficiency   6. Current use of long term anticoagulation   7. Carpal tunnel syndrome on right  8. History of ovarian cancer   9. Mixed hyperlipidemia       Plan: See instructions  Orders Placed This Encounter  Procedures  . CBC  . Comprehensive metabolic panel  . Lipid panel  . Sedimentation rate  . TSH  . VITAMIN D 25 Hydroxy (Vit-D Deficiency, Fractures)    osteopenia  . Ambulatory referral to Neurosurgery    Referral Priority:   Routine    Referral Type:   Surgical    Referral Reason:   Specialty Services Required    Referred to Provider:   Newman Pies, MD    Requested Specialty:   Neurosurgery    Number of Visits Requested:   1    No orders of the defined types were placed in this encounter.        Patient Instructions   Your gait  disturbance is my biggest concern for you with your decline in ability to walk and your risk of falls.  Referral is being made to Dr. Newman Pies, neurosurgery, to evaluate your spine.  MRI in 2013 of your neck showed spinal stenosis developing in the neck and in 2019 MRI of the lumbar spine showed spinal stenosis developing there.  I do suggest trying to use a walker.  Continue your routine follow-ups for your history of ovarian cancer, for you CPAP, other orthopedic needs as needed.  Continue your current medications.  I do not have an easy answer for your chronic pain, and would prefer to wait until you see the neurosurgeon.  Continue taking the acetaminophen (Tylenol) but you might consider decreasing by a total of 1 pill a day, taking 2 in the morning, 1 in the afternoon, and 2 at night.  Although theoretically 4000 mg a day is safe, there are occasional reports of toxicity below that dose and I usually recommend trying to keep it 3000 mg a day or less.  Do continue to try and eat less and see if you can lose some weight which would help your mobility.  I know you cannot do a lot of exercise to reduce calories.  As I discussed, I would like to encourage people to be physically, emotionally, relationally, and spiritually healthy.  If you find you having new concerns get the necessary help.  Return annually or as needed  I will try to let you know the results of your laboratory testing when they are available.     If you have lab work done today you will be contacted with your lab results within the next 2 weeks.  If you have not heard from [place] then please contact us. The fastest way to get your results is to register for My Chart.   IF you received an x-ray today, you will receive an invoice from [facility]. Please contact [facility] at [phone] with questions or concerns regarding your invoice.   IF you received labwork today, you will receive an invoice from  [place]. Please contact [facility] at [phone] with questions or concerns regarding your invoice.   Our billing staff will not be able to assist you with questions regarding bills from these companies.  You will be contacted with the lab results as soon as they are available. The fastest way to get your results is to activate your My Chart account. Instructions are located on the last page of this paperwork. If you have not heard from [place] regarding the results in 2 weeks, please contact this office.  Return in about 1 year (around 12/19/2020).   Ruben Reason, MD 12/20/2019

## 2019-12-20 NOTE — Patient Instructions (Addendum)
Your gait disturbance is my biggest concern for you with your decline in ability to walk and your risk of falls.  Referral is being made to Dr. Newman Pies, neurosurgery, to evaluate your spine.  MRI in 2013 of your neck showed spinal stenosis developing in the neck and in 2019 MRI of the lumbar spine showed spinal stenosis developing there.  I do suggest trying to use a walker.  Continue your routine follow-ups for your history of ovarian cancer, for you CPAP, other orthopedic needs as needed.  Continue your current medications.  I do not have an easy answer for your chronic pain, and would prefer to wait until you see the neurosurgeon.  Continue taking the acetaminophen (Tylenol) but you might consider decreasing by a total of 1 pill a day, taking 2 in the morning, 1 in the afternoon, and 2 at night.  Although theoretically 4000 mg a day is safe, there are occasional reports of toxicity below that dose and I usually recommend trying to keep it 3000 mg a day or less.  Do continue to try and eat less and see if you can lose some weight which would help your mobility.  I know you cannot do a lot of exercise to reduce calories.  As I discussed, I would like to encourage people to be physically, emotionally, relationally, and spiritually healthy.  If you find you having new concerns get the necessary help.  Return annually or as needed  I will try to let you know the results of your laboratory testing when they are available.     If you have lab work done today you will be contacted with your lab results within the next 2 weeks.  If you have not heard from [place] then please contact us. The fastest way to get your results is to register for My Chart.   IF you received an x-ray today, you will receive an invoice from [facility]. Please contact [facility] at [phone] with questions or concerns regarding your invoice.   IF you received labwork today, you will receive an  invoice from [facility]. Please contact [facility] at [phone] with questions or concerns regarding your invoice.   Our billing staff will not be able to assist you with questions regarding bills from these companies.  You will be contacted with the lab results as soon as they are available. The fastest way to get your results is to activate your My Chart account. Instructions are located on the last page of this paperwork. If you have not heard from [place] regarding the results in 2 weeks, please contact this office.

## 2019-12-21 LAB — COMPREHENSIVE METABOLIC PANEL
ALT: 27 IU/L (ref 0–32)
AST: 22 IU/L (ref 0–40)
Albumin/Globulin Ratio: 2.1 (ref 1.2–2.2)
Albumin: 4.7 g/dL (ref 3.7–4.7)
Alkaline Phosphatase: 121 IU/L (ref 44–121)
BUN/Creatinine Ratio: 22 (ref 12–28)
BUN: 15 mg/dL (ref 8–27)
Bilirubin Total: 0.6 mg/dL (ref 0.0–1.2)
CO2: 25 mmol/L (ref 20–29)
Calcium: 10 mg/dL (ref 8.7–10.3)
Chloride: 106 mmol/L (ref 96–106)
Creatinine, Ser: 0.68 mg/dL (ref 0.57–1.00)
GFR calc Af Amer: 102 mL/min/{1.73_m2} (ref >59)
GFR calc non Af Amer: 88 mL/min/{1.73_m2} (ref >59)
Globulin, Total: 2.2 g/dL (ref 1.5–4.5)
Glucose: 104 mg/dL — ABNORMAL HIGH (ref 65–99)
Potassium: 4 mmol/L (ref 3.5–5.2)
Sodium: 145 mmol/L — ABNORMAL HIGH (ref 134–144)
Total Protein: 6.9 g/dL (ref 6.0–8.5)

## 2019-12-21 LAB — CBC
Hematocrit: 40.3 % (ref 34.0–46.6)
Hemoglobin: 13.5 g/dL (ref 11.1–15.9)
MCH: 29.5 pg (ref 26.6–33.0)
MCHC: 33.5 g/dL (ref 31.5–35.7)
MCV: 88 fL (ref 79–97)
Platelets: 181 10*3/uL (ref 150–450)
RBC: 4.58 x10E6/uL (ref 3.77–5.28)
RDW: 14 % (ref 11.7–15.4)
WBC: 3.6 10*3/uL (ref 3.4–10.8)

## 2019-12-21 LAB — LIPID PANEL
Chol/HDL Ratio: 4.6 ratio — ABNORMAL HIGH (ref 0.0–4.4)
Cholesterol, Total: 198 mg/dL (ref 100–199)
HDL: 43 mg/dL (ref >39)
LDL Chol Calc (NIH): 108 mg/dL — ABNORMAL HIGH (ref 0–99)
Triglycerides: 271 mg/dL — ABNORMAL HIGH (ref 0–149)
VLDL Cholesterol Cal: 47 mg/dL — ABNORMAL HIGH (ref 5–40)

## 2019-12-21 LAB — SEDIMENTATION RATE: Sed Rate: 7 mm/hr (ref 0–40)

## 2019-12-21 LAB — VITAMIN D 25 HYDROXY (VIT D DEFICIENCY, FRACTURES): Vit D, 25-Hydroxy: 37 ng/mL (ref 30.0–100.0)

## 2019-12-21 LAB — TSH: TSH: 1.81 u[IU]/mL (ref 0.450–4.500)

## 2019-12-28 ENCOUNTER — Other Ambulatory Visit: Payer: Self-pay | Admitting: Neurosurgery

## 2019-12-28 DIAGNOSIS — G8929 Other chronic pain: Secondary | ICD-10-CM

## 2019-12-31 ENCOUNTER — Ambulatory Visit
Admission: RE | Admit: 2019-12-31 | Discharge: 2019-12-31 | Disposition: A | Discharge status: Home / Self Care | Payer: Medicare PPO | Source: Ambulatory Visit | Attending: Neurosurgery | Admitting: Neurosurgery

## 2019-12-31 ENCOUNTER — Other Ambulatory Visit: Payer: Self-pay

## 2019-12-31 DIAGNOSIS — G8929 Other chronic pain: Secondary | ICD-10-CM

## 2020-01-20 ENCOUNTER — Other Ambulatory Visit: Payer: Medicare PPO

## 2020-01-25 ENCOUNTER — Telehealth: Payer: Self-pay

## 2020-01-25 NOTE — Telephone Encounter (Signed)
Patient requesting x-ray imaging for hip and spine completed several years back to be sent to [facility]. Only fax provided [phone]. Please assist

## 2020-01-30 ENCOUNTER — Other Ambulatory Visit: Payer: Self-pay | Admitting: Obstetrics & Gynecology

## 2020-01-30 DIAGNOSIS — Z9189 Other specified personal risk factors, not elsewhere classified: Secondary | ICD-10-CM

## 2020-01-31 ENCOUNTER — Encounter: Payer: Self-pay | Admitting: General Practice

## 2020-01-31 ENCOUNTER — Other Ambulatory Visit: Payer: Self-pay

## 2020-01-31 ENCOUNTER — Other Ambulatory Visit: Payer: Self-pay | Admitting: Cardiovascular Disease

## 2020-01-31 MED ORDER — RIVAROXABAN 20 MG PO TABS: 20.0000 mg | ORAL_TABLET | Freq: Every day | ORAL | 1 refills | Status: DC

## 2020-02-05 ENCOUNTER — Other Ambulatory Visit: Payer: Self-pay | Admitting: *Deleted

## 2020-02-05 NOTE — Telephone Encounter (Signed)
Prescription refill request received for [facility]arel[facility] from [facility].  Per Dr. Kyla Balzarine note 11/27/2018 pt is to follow cardiology prn. Per records it show pt's PCP sent in a refill for Xarelto on 01/31/2020. [facility] to [facility] who confirmed that they have the Xarelto refill prescription from 01/31/2020. Informed them that the PCP should continue to refill medication because she only sees cardiology as needed. Since pt already has refill from PCP for xarelto on file will not send in another refill.

## 2020-02-11 LAB — HM DEXA SCAN

## 2020-02-12 ENCOUNTER — Other Ambulatory Visit: Payer: Self-pay | Admitting: Cardiovascular Disease

## 2020-02-28 ENCOUNTER — Other Ambulatory Visit (HOSPITAL_BASED_OUTPATIENT_CLINIC_OR_DEPARTMENT_OTHER): Payer: Self-pay | Admitting: Obstetrics & Gynecology

## 2020-02-28 ENCOUNTER — Telehealth (HOSPITAL_BASED_OUTPATIENT_CLINIC_OR_DEPARTMENT_OTHER): Payer: Self-pay | Admitting: Obstetrics & Gynecology

## 2020-03-11 ENCOUNTER — Telehealth: Payer: Self-pay

## 2020-03-11 NOTE — Telephone Encounter (Signed)
No pre Auth needed see note.

## 2020-03-11 NOTE — Telephone Encounter (Signed)
Pt saw [name] 12/20/2019 for wellness, they addressed a mobility issue pt is now requesting we fill out the form and send it in for her so she can get assistance with her trash. Please review and sign if you are comfortable.

## 2020-03-17 ENCOUNTER — Other Ambulatory Visit (HOSPITAL_BASED_OUTPATIENT_CLINIC_OR_DEPARTMENT_OTHER): Payer: Self-pay | Admitting: Obstetrics & Gynecology

## 2020-03-17 DIAGNOSIS — Z9189 Other specified personal risk factors, not elsewhere classified: Secondary | ICD-10-CM

## 2020-03-24 ENCOUNTER — Encounter: Payer: Self-pay | Admitting: Registered Nurse

## 2020-03-24 ENCOUNTER — Other Ambulatory Visit: Payer: Self-pay | Admitting: Physician Assistant

## 2020-03-24 ENCOUNTER — Telehealth: Payer: Self-pay

## 2020-03-24 NOTE — Telephone Encounter (Signed)
Pt. Called in requesting letter be sent to Chauncey Mann at the [place], fax [phone]. For a letter certifying she has mobility issues and to have the trash men pick up the trash at her house instead of the curb.

## 2020-03-24 NOTE — Telephone Encounter (Signed)
Yes we can write it. I'll print it now.  Thanks,  Denice Paradise

## 2020-03-24 NOTE — Telephone Encounter (Signed)
Are you willing to write this letter for this patient or will I need to get her scheduled for a virtual visit since Dr Linna Darner is no longer here?

## 2020-03-25 ENCOUNTER — Other Ambulatory Visit: Payer: Self-pay | Admitting: Family Medicine

## 2020-03-25 NOTE — Telephone Encounter (Signed)
Requested medication (s) are due for refill today:   Requested medication (s) are on the active medication list: yes  Last refill:  12/17/19  Future visit scheduled: no  Notes to clinic:  origninal script per Ermalinda Barrios, cardiology; pt seen by Ruben Reason at [facility]    Requested Prescriptions  Pending Prescriptions Disp Refills   lisinopril (ZESTRIL) 40 MG tablet [Pharmacy Med Name: LISINOPRIL 40 MG TABLET] 90 tablet 3    Sig: TAKE 1 TABLET BY MOUTH EVERY DAY      There is no refill protocol information for this order

## 2020-03-25 NOTE — Telephone Encounter (Signed)
Patient has not been seen since 11/2018. Patient was to follow up as needed. Patient should have PCP start filling this medication.

## 2020-03-26 NOTE — Telephone Encounter (Signed)
Please schedule f/u appt for med refills. 30 Day supply has been sent.

## 2020-03-26 NOTE — Telephone Encounter (Signed)
Patient wants to know why she's no longer able to receive a year's supply of medication. Patient also wants update on paperwork submitted 03/11/20 for approval to no longer have to put trash cans on curb. Patient uses a walker and is unable to move trash cans out to curb.Please advise at [phone]

## 2020-03-26 NOTE — Telephone Encounter (Signed)
Pt will need an appt for more refills pt can also discuss paper work at said appt I do see a letter from Mr.Orland Mustard about the pt's trash cans.

## 2020-03-26 NOTE — Telephone Encounter (Signed)
No further refills without office visit 

## 2020-03-27 NOTE — Telephone Encounter (Signed)
Patient has been informed.

## 2020-03-28 NOTE — Telephone Encounter (Signed)
Patient chose to schedule follow up with FNP Just.

## 2020-03-28 NOTE — Telephone Encounter (Signed)
Spoke with patient and gave information for Dr. Vonna Kotyk new office. Patient still disputing reason for needing another appointment when she was seen recently. Patient wants to schedule follow up with another provider prior to our last day if possible.

## 2020-03-31 ENCOUNTER — Other Ambulatory Visit: Payer: Self-pay

## 2020-03-31 ENCOUNTER — Encounter: Payer: Self-pay | Admitting: Family Medicine

## 2020-03-31 ENCOUNTER — Ambulatory Visit: Payer: Medicare PPO | Admitting: Family Medicine

## 2020-03-31 VITALS — BP 146/76 | HR 83 | Temp 97.9°F | Resp 15 | Ht 66.5 in

## 2020-03-31 DIAGNOSIS — B379 Candidiasis, unspecified: Secondary | ICD-10-CM

## 2020-03-31 DIAGNOSIS — G4733 Obstructive sleep apnea (adult) (pediatric): Secondary | ICD-10-CM | POA: Diagnosis not present

## 2020-03-31 DIAGNOSIS — Z86718 Personal history of other venous thrombosis and embolism: Secondary | ICD-10-CM

## 2020-03-31 DIAGNOSIS — I10 Essential (primary) hypertension: Secondary | ICD-10-CM

## 2020-03-31 DIAGNOSIS — Z0289 Encounter for other administrative examinations: Secondary | ICD-10-CM

## 2020-03-31 DIAGNOSIS — E782 Mixed hyperlipidemia: Secondary | ICD-10-CM

## 2020-03-31 MED ORDER — LISINOPRIL 40 MG PO TABS: 40.0000 mg | ORAL_TABLET | Freq: Every day | ORAL | 3 refills | Status: AC

## 2020-03-31 MED ORDER — ATORVASTATIN CALCIUM 40 MG PO TABS
40.0000 mg | ORAL_TABLET | Freq: Every day | ORAL | 3 refills | Status: AC
Start: 2020-03-31 — End: ?

## 2020-03-31 MED ORDER — NIACIN 500 MG PO TABS: 500.0000 mg | ORAL_TABLET | Freq: Every evening | ORAL | 3 refills | Status: AC

## 2020-03-31 MED ORDER — FUROSEMIDE 20 MG PO TABS: 20.0000 mg | ORAL_TABLET | Freq: Every day | ORAL | 3 refills | Status: AC

## 2020-03-31 MED ORDER — NYSTATIN 100000 UNIT/GM EX CREA: 1.0000 "application " | TOPICAL_CREAM | Freq: Two times a day (BID) | CUTANEOUS | 4 refills | Status: DC

## 2020-03-31 MED ORDER — RIVAROXABAN 20 MG PO TABS: 20.0000 mg | ORAL_TABLET | Freq: Every day | ORAL | 1 refills | Status: AC

## 2020-03-31 MED ORDER — FLUTICASONE PROPIONATE 50 MCG/ACT NA SUSP: 1.0000 | Freq: Two times a day (BID) | NASAL | 3 refills | Status: AC

## 2020-03-31 NOTE — Progress Notes (Signed)
3/21/20223:49 PM  [name] 07-19-1950, 70 y.o., female [id number]  Chief Complaint  Patient presents with  . Medication Refill    Pt seeing new PCP 06/11/2020 and is requesting refill maintensnce meds until that time reports they are doing well no side effects. 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 female with past medical history significant for HTN, OSA, OA who presents today for medication refills and form completion.  Appointment with new PCP is 06/11/20 Overall is doing well Has bilateral hip replacement surgery scheduled Will plan to do one hip at a time Needs forms completed for garbage pickup Denies any issues with current regimens Labs done in December with Dr. Linna Darner  BP Readings from Last 3 Encounters:  03/31/20 (!) 146/76  12/20/19 (!) 142/72  11/08/19 129/82   Lab Results  Component Value Date   CHOL 198 12/20/2019   HDL 43 12/20/2019   LDLCALC 108 (H) 12/20/2019   TRIG 271 (H) 12/20/2019   CHOLHDL 4.6 (H) 12/20/2019     Depression screen [facility] 2/9 12/20/2019 11/23/2018 11/21/2017  Decreased Interest 0 0 0  Down, Depressed, Hopeless 0 0 0  PHQ - 2 Score 0 0 0  Some recent data might be hidden    Fall Risk  12/20/2019 11/23/2018 11/21/2017 03/11/2017 03/04/2017  Falls in the past year? 0 0 0 No No  Number falls in past yr: 0 0 0 - -  Injury with Fall? 0 0 0 - -  Risk for fall due to : No Fall Risks Impaired mobility;Impaired balance/gait - - -  Follow up Falls evaluation completed - - - -     Allergies  Allergen Reactions  . Valtrex [Valacyclovir Hcl] Swelling and Rash    Swelling of face and arm, rash  . Benoxinate Base     Turns eyes blood red/burning. --in eyedrops--  . Other Other (See Comments)  . Sulfa Antibiotics Nausea Only  . Sulfonamide Derivatives   . Iodinated Diagnostic Agents Rash    Rash on back Other reaction(s): Unknown  .  Valtrex [Valacyclovir] Rash    Prior to Admission medications   Medication Sig Start Date End Date Taking? Authorizing Provider  ACETAMINOPHEN PO Take 650 mg by mouth. Takes 2 664m 3 times daily   Yes [provider]  atorvastatin (LIPITOR) 40 MG tablet Take 1 tablet (40 mg total) by mouth daily. Please schedule appt for future refills. 1st attempt. 02/22/19  Yes SJacelyn Pi ILilia Argue MD  Cholecalciferol (VITAMIN D) 50 MCG (2000 UT) tablet Take 2,000 Units by mouth daily. TAKES EVERY EVE   Yes [provider]  fish oil-omega-3 fatty acids 1000 MG capsule Take 1-2 g by mouth 2 (two) times daily.    Yes [provider]  fluticasone (FLONASE) 50 MCG/ACT nasal spray Place 1 spray into both nostrils 2 (two) times daily. 11/27/19  Yes HPosey Boyer MD  furosemide (LASIX) 20 MG tablet Take 1 tablet (20 mg total) by mouth daily. Please make overdue appt with Dr. NJohnsie Cancelor refill with PCP before anymore refills. Thank you 2nd attempt 01/31/20  Yes NJosue Hector MD  lisinopril (ZESTRIL) 40 MG tablet TAKE 1 TABLET BY MOUTH EVERY DAY 03/26/20  Yes MMaximiano Coss NP  metroNIDAZOLE (METROCREAM) 0.75 % cream Apply topically 2 (two) times daily. 11/08/19  Yes HPosey Boyer MD  Multiple Vitamin (MULTIVITAMIN) tablet Take 1 tablet by mouth every morning.   Yes [provider]  niacin 500 MG tablet Take 500 mg by mouth every evening.   Yes [provider]  nystatin cream (MYCOSTATIN) Apply 1 application topically 2 (two) times daily. 11/23/18  Yes Jacelyn Pi, Lilia Argue, MD  rivaroxaban (XARELTO) 20 MG TABS tablet Take 1 tablet (20 mg total) by mouth daily. 01/31/20  Yes Piper Albro, Laurita Quint, FNP    Past Medical History:  Diagnosis Date  . Anemia    oral iron occ.  . Arthritis   . Basal cell carcinoma of nose   . Bronchitis   . Bursitis of left hip   . Cancer ([place])    [employer]reesia 12/19/2019  . 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 Carpal  tunnel syndrome    Right hand  . Cataract    Phreesia 12/19/2019  . Degenerative joint disease of both hips 2019  . Diverticulitis    past history  . DVT (deep vein thrombosis) in pregnancy    LEFT LEG  . Family history of breast cancer   . 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 FH: genetic disease carrier 11/05/2016  . Genetic testing 11/04/2016   Multi-Cancer panel (83 genes) @ [facility] - see report; special interpretation  . Glaucoma    [facility]reesia 12/19/2019  . History of ovarian cancer   . HTN (hypertension)   . Hyperlipidemia   . Hypertension    [facility]reesia 12/19/2019  . OSA (obstructive sleep apnea)    hypoventilation, on CPAP  . Osteopenia 2019  . Shingles   . Sleep apnea    [facility]ia 12/19/2019    Past Surgical History:  Procedure Laterality Date  . ABDOMINAL HYSTERECTOMY N/A    [facility]ia 12/19/2019  . BASAL CELL CARCINOMA EXCISION     resection with plastic surgery reconstrustion  . bilateral breast mass  1970   benigned, left breast  . BREAST SURGERY N/A    [facility]reesia 12/19/2019  . cataract surgery Bilateral [id number], 2005   with bilateral lens replacements  . COLONOSCOPY WITH PROPOFOL N/A 05/21/2014   Procedure: COLONOSCOPY WITH PROPOFOL;  Surgeon: Juanita Craver, MD;  Location: [facility];  Service: Endoscopy;  Laterality: N/A;  . EYE SURGERY N/A    [facility]reesia 12/19/2019  . HERNIA REPAIR  2119   umbilical repair with mesh   . TOTAL ABDOMINAL HYSTERECTOMY W/ BILATERAL SALPINGOOPHORECTOMY  2005   Stage IA ovarian cancer    Social History   Tobacco Use  . Smoking status: Never Smoker  . Smokeless tobacco: Never Used  . Tobacco comment: only for 6 months in the 70s  Substance Use Topics  . Alcohol use: Not Currently    Alcohol/week: 0.0 standard drinks    Family History  Problem Relation Age of Onset  . Alzheimer's disease Mother   . Hypertension Mother   . Alzheimer's disease Father   .  Hypertension Father   . Colon cancer Father 44       deceased 36  . Hypertension Sister   . Breast cancer Sister 42  . Parkinson's disease Sister   . Hypertension Brother   . Prostate cancer Brother 76  . Prostate cancer Other        father's maternal half-brother  . Breast cancer Other 32       daughter of brother; currently 6  Review of Systems  Constitutional: Negative for chills, fever and malaise/fatigue.  Eyes: Negative for blurred vision and double vision.  Respiratory: Negative for cough, shortness of breath and wheezing.   Cardiovascular: Negative for chest pain, palpitations and leg swelling.  Gastrointestinal: Negative for abdominal pain, blood in stool, constipation, diarrhea, heartburn, nausea and vomiting.  Genitourinary: Negative for dysuria, frequency and hematuria.  Musculoskeletal: Positive for joint pain (bilateral hip pain). Negative for back pain.  Skin: Negative for rash.  Neurological: Negative for dizziness, weakness and headaches.     OBJECTIVE:  Today's Vitals   03/31/20 1514  BP: (!) 146/76  Pulse: 83  Resp: 15  Temp: 97.9 F (36.6 C)  TempSrc: Temporal  SpO2: 96%  Height: 5' 6.5" (1.689 m)   Body mass index is 45.63 kg/m.   Physical Exam Constitutional:      General: She is not in acute distress.    Appearance: Normal appearance. She is not ill-appearing.  HENT:     Head: Normocephalic.  Cardiovascular:     Rate and Rhythm: Normal rate and regular rhythm.     Pulses: Normal pulses.     Heart sounds: Normal heart sounds. No murmur heard. No friction rub. No gallop.   Pulmonary:     Effort: Pulmonary effort is normal. No respiratory distress.     Breath sounds: Normal breath sounds. No stridor. No wheezing, rhonchi or rales.  Abdominal:     General: Bowel sounds are normal.     Palpations: Abdomen is soft.     Tenderness: There is no abdominal tenderness.  Musculoskeletal:     Right lower leg: No edema.     Left lower leg:  No edema.  Skin:    General: Skin is warm and dry.  Neurological:     Mental Status: She is alert and oriented to person, place, and time.  Psychiatric:        Mood and Affect: Mood normal.        Behavior: Behavior normal.     No results found for this or any previous visit (from the past 24 hour(s)).  No results found.   ASSESSMENT and PLAN  Problem List Items Addressed This Visit      Cardiovascular and Mediastinum   ESSENTIAL HYPERTENSION, BENIGN   Relevant Medications   atorvastatin (LIPITOR) 40 MG tablet   lisinopril (ZESTRIL) 40 MG tablet   rivaroxaban (XARELTO) 20 MG TABS tablet   furosemide (LASIX) 20 MG tablet   niacin 500 MG tablet     Respiratory   Severe obstructive sleep apnea   Relevant Medications   fluticasone (FLONASE) 50 MCG/ACT nasal spray     Other   Hyperlipidemia   Relevant Medications   atorvastatin (LIPITOR) 40 MG tablet   lisinopril (ZESTRIL) 40 MG tablet   rivaroxaban (XARELTO) 20 MG TABS tablet   furosemide (LASIX) 20 MG tablet   History of DVT (deep vein thrombosis)   Relevant Medications   rivaroxaban (XARELTO) 20 MG TABS tablet    Other Visit Diagnoses    Encounter for completion of form with patient    -  Primary   Yeast infection       Relevant Medications   nystatin cream (MYCOSTATIN)      Plan . Medication refills sent . Forms completed and returned to patient . Chronic conditions stable on current regimens . Continue to monitor BP for goal< 130/80   Return in about 3 months (around 07/01/2020).    Jame Morrell,  FNP-BC [facility] [place], [place] [address] Ph.  [phone] Fax [phone]

## 2020-03-31 NOTE — Patient Instructions (Addendum)
  Health Maintenance After Age 72 After age 72, you are at a higher risk for certain long-term diseases and infections as well as injuries from falls. Falls are a major cause of broken bones and head injuries in people who are older than age 72. Getting regular preventive care can help to keep you healthy and well. Preventive care includes getting regular testing and making lifestyle changes as recommended by your health care provider. Talk with your health care provider about:  Which screenings and tests you should have. A screening is a test that checks for a disease when you have no symptoms.  A diet and exercise plan that is right for you. What should I know about screenings and tests to prevent falls? Screening and testing are the best ways to find a health problem early. Early diagnosis and treatment give you the best chance of managing medical conditions that are common after age 72. Certain conditions and lifestyle choices may make you more likely to have a fall. Your health care provider may recommend:  Regular vision checks. Poor vision and conditions such as cataracts can make you more likely to have a fall. If you wear glasses, make sure to get your prescription updated if your vision changes.  Medicine review. Work with your health care provider to regularly review all of the medicines you are taking, including over-the-counter medicines. Ask your health care provider about any side effects that may make you more likely to have a fall. Tell your health care provider if any medicines that you take make you feel dizzy or sleepy.  Osteoporosis screening. Osteoporosis is a condition that causes the bones to get weaker. This can make the bones weak and cause them to break more easily.  Blood pressure screening. Blood pressure changes and medicines to control blood pressure can make you feel dizzy.  Strength and balance checks. Your health care provider may recommend certain tests to  check your strength and balance while standing, walking, or changing positions.  Foot health exam. Foot pain and numbness, as well as not wearing proper footwear, can make you more likely to have a fall.  Depression screening. You may be more likely to have a fall if you have a fear of falling, feel emotionally low, or feel unable to do activities that you used to do.  Alcohol use screening. Using too much alcohol can affect your balance and may make you more likely to have a fall. What actions can I take to lower my risk of falls? General instructions  Talk with your health care provider about your risks for falling. Tell your health care provider if: ? You fall. Be sure to tell your health care provider about all falls, even ones that seem minor. ? You feel dizzy, sleepy, or off-balance.  Take over-the-counter and prescription medicines only as told by your health care provider. These include any supplements.  Eat a healthy diet and maintain a healthy weight. A healthy diet includes low-fat dairy products, low-fat (lean) meats, and fiber from whole grains, beans, and lots of fruits and vegetables. Home safety  Remove any tripping hazards, such as rugs, cords, and clutter.  Install safety equipment such as grab bars in bathrooms and safety rails on stairs.  Keep rooms and walkways well-lit. Activity  Follow a regular exercise program to stay fit. This will help you maintain your balance. Ask your health care provider what types of exercise are appropriate for you.  If you need a cane   or walker, use it as recommended by your health care provider.  Wear supportive shoes that have nonskid soles.   Lifestyle  Do not drink alcohol if your health care provider tells you not to drink.  If you drink alcohol, limit how much you have: ? 0-1 drink a day for women. ? 0-2 drinks a day for men.  Be aware of how much alcohol is in your drink. In the [place] one drink equals one typical bottle  of beer (12 oz), one-half glass of wine (5 oz), or one shot of hard liquor (1 oz).  Do not use any products that contain nicotine or tobacco, such as cigarettes and e-cigarettes. If you need help quitting, ask your health care provider. Summary  Having a healthy lifestyle and getting preventive care can help to protect your health and wellness after age 72.  Screening and testing are the best way to find a health problem early and help you avoid having a fall. Early diagnosis and treatment give you the best chance for managing medical conditions that are more common for people who are older than age 72.  Falls are a major cause of broken bones and head injuries in people who are older than age 72. Take precautions to prevent a fall at home.  Work with your health care provider to learn what changes you can make to improve your health and wellness and to prevent falls. This information is not intended to replace advice given to you by your health care provider. Make sure you discuss any questions you have with your health care provider. Document Revised: 04/20/2018 Document Reviewed: 11/10/2016 Elsevier Patient Education  2021 Elsevier Inc.   If you have lab work done today you will be contacted with your lab results within the next 2 weeks.  If you have not heard from us then please contact us. The fastest way to get your results is to register for My Chart.   IF you received an x-ray today, you will receive an invoice from [facility]. Please contact [facility] at [phone] with questions or concerns regarding your invoice.   IF you received labwork today, you will receive an invoice from [facility]. Please contact [facility] at [phone] with questions or concerns regarding your invoice.   Our billing staff will not be able to assist you with questions regarding bills from these companies.  You will be contacted with the lab results as soon as they are available.  The fastest way to get your results is to activate your My Chart account. Instructions are located on the last page of this paperwork. If you have not heard from us regarding the results in 2 weeks, please contact this office.      

## 2020-04-02 ENCOUNTER — Other Ambulatory Visit: Payer: Self-pay | Admitting: Family Medicine

## 2020-04-06 ENCOUNTER — Other Ambulatory Visit: Payer: Medicare PPO

## 2020-05-01 ENCOUNTER — Encounter (HOSPITAL_BASED_OUTPATIENT_CLINIC_OR_DEPARTMENT_OTHER): Payer: Self-pay

## 2020-05-05 ENCOUNTER — Ambulatory Visit: Payer: Medicare PPO | Admitting: Obstetrics & Gynecology

## 2020-05-05 ENCOUNTER — Ambulatory Visit (INDEPENDENT_AMBULATORY_CARE_PROVIDER_SITE_OTHER): Payer: Medicare PPO | Admitting: Obstetrics & Gynecology

## 2020-05-05 ENCOUNTER — Other Ambulatory Visit (HOSPITAL_BASED_OUTPATIENT_CLINIC_OR_DEPARTMENT_OTHER)
Admission: RE | Admit: 2020-05-05 | Discharge: 2020-05-05 | Disposition: A | Discharge status: Home / Self Care | Payer: Medicare PPO | Source: Ambulatory Visit | Attending: Obstetrics & Gynecology | Admitting: Obstetrics & Gynecology

## 2020-05-05 ENCOUNTER — Encounter (HOSPITAL_BASED_OUTPATIENT_CLINIC_OR_DEPARTMENT_OTHER): Payer: Self-pay | Admitting: Obstetrics & Gynecology

## 2020-05-05 ENCOUNTER — Other Ambulatory Visit (HOSPITAL_BASED_OUTPATIENT_CLINIC_OR_DEPARTMENT_OTHER): Payer: Self-pay

## 2020-05-05 ENCOUNTER — Other Ambulatory Visit: Payer: Self-pay

## 2020-05-05 VITALS — BP 129/68 | HR 85 | Ht 67.0 in | Wt 280.0 lb

## 2020-05-05 DIAGNOSIS — Z86718 Personal history of other venous thrombosis and embolism: Secondary | ICD-10-CM

## 2020-05-05 DIAGNOSIS — Z9189 Other specified personal risk factors, not elsewhere classified: Secondary | ICD-10-CM

## 2020-05-05 DIAGNOSIS — Z01419 Encounter for gynecological examination (general) (routine) without abnormal findings: Secondary | ICD-10-CM

## 2020-05-05 DIAGNOSIS — Z803 Family history of malignant neoplasm of breast: Secondary | ICD-10-CM | POA: Diagnosis not present

## 2020-05-05 DIAGNOSIS — C562 Malignant neoplasm of left ovary: Secondary | ICD-10-CM

## 2020-05-05 DIAGNOSIS — R7989 Other specified abnormal findings of blood chemistry: Secondary | ICD-10-CM | POA: Diagnosis not present

## 2020-05-05 DIAGNOSIS — Z1379 Encounter for other screening for genetic and chromosomal anomalies: Secondary | ICD-10-CM

## 2020-05-05 NOTE — Progress Notes (Addendum)
70 y.o. G0P0 Single White or [name] female here for breast and pelvic exam.  Pt has decided to proceed with breast MRI this year due to increased lifetime risk.  Was authorized with insurance and order placed.  When pt called to schedule was advised precert had expired despite me calling her personally and advising these was a finite amt of time that the precert was good.  Order was placed for breast MRI after precert in Feb and repeat order placed in early March.  Pt feels [facility] dropped the ball.  Process has to be restarted.    Denies vaginal bleeding.  Having so much hip pain must now use crutches to walk.  She is having both hips replaced this year with Dr. Maureen Ralphs.  States he told her he didn't know how she was walking.  Has chronic skin yeast.  Uses Nystatin.  Refill request just came from pharmacy has was completed.  She's trying very hard to keep skin healed prior to surgery.   Patient's last menstrual period was 01/12/2003.          Sexually active: No.  H/O STD:  no  Health Maintenance: PCP:  Dr. Coletta Memos.  Last wellness appt is scheduled 06/2020.  Blood work planned.  MMG:  02/11/2020 Colonoscopy: 05/21/2014, Cologuard 11/2019 BMD:  Osteopenia, -1.9 Last pap smear:  Years ago.   H/o abnormal pap smear:  no    reports that she has never smoked. She has never used smokeless tobacco. She reports previous alcohol use. She reports that she does not use drugs.  Past Medical History:  Diagnosis Date  . Anemia    oral iron occ.  . Arthritis   . Basal cell carcinoma of nose   . Bronchitis   . Bursitis of left hip   . Cancer ([place])    Phreesia 12/19/2019  . 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 Carpal tunnel syndrome    Right hand  . Cataract    Phreesia 12/19/2019  . Degenerative joint disease of both hips 2019  . Diverticulitis    past history  . DVT (deep vein thrombosis) in pregnancy    LEFT LEG  . Family history of breast cancer   . Femoral  DVT (deep venous thrombosis) ([place]) 2008   -"wears compression hose all times" left leg  . FH: genetic disease carrier 11/05/2016  . Genetic testing 11/04/2016   Multi-Cancer panel (83 genes) @ [facility] - see report; special interpretation  . Glaucoma    Phreesia 12/19/2019  . History of ovarian cancer   . HTN (hypertension)   . Hyperlipidemia   . Hypertension    Phreesia 12/19/2019  . OSA (obstructive sleep apnea)    hypoventilation, on CPAP  . Osteopenia 2019  . Shingles   . Sleep apnea    Phreesia 12/19/2019    Past Surgical History:  Procedure Laterality Date  . ABDOMINAL HYSTERECTOMY N/A    Phreesia 12/19/2019  . BASAL CELL CARCINOMA EXCISION     resection with plastic surgery reconstrustion  . bilateral breast mass  1970   benigned, left breast  . BREAST SURGERY N/A    Phreesia 12/19/2019  . cataract surgery Bilateral [id number], 2005   with bilateral lens replacements  . COLONOSCOPY WITH PROPOFOL N/A 05/21/2014   Procedure: COLONOSCOPY WITH PROPOFOL;  Surgeon: Juanita Craver, MD;  [facility];  Service: Endoscopy;  Laterality: N/A;  . EYE SURGERY N/A    Phreesia  12/19/2019  . HERNIA REPAIR  [id number]   umbilical repair with mesh   . TOTAL ABDOMINAL HYSTERECTOMY W/ BILATERAL SALPINGOOPHORECTOMY  2005   Stage IA ovarian cancer    Current Outpatient Medications  Medication Sig Dispense Refill  . ACETAMINOPHEN PO Take 650 mg by mouth. Takes 2 654m 3 times daily    . atorvastatin (LIPITOR) 40 MG tablet Take 1 tablet (40 mg total) by mouth daily. 90 tablet 3  . Cholecalciferol (VITAMIN D) 50 MCG (2000 UT) tablet Take 2,000 Units by mouth daily. TAKES EVERY EVE    . fish oil-omega-3 fatty acids 1000 MG capsule Take 1-2 g by mouth 2 (two) times daily.     . fluticasone (FLONASE) 50 MCG/ACT nasal spray Place 1 spray into both nostrils 2 (two) times daily. 48 g 3  . furosemide (LASIX) 20 MG tablet Take 1 tablet (20 mg total) by mouth daily. 90 tablet 3  . lisinopril  (ZESTRIL) 40 MG tablet Take 1 tablet (40 mg total) by mouth daily. 90 tablet 3  . Multiple Vitamin (MULTIVITAMIN) tablet Take 1 tablet by mouth every morning.    . niacin 500 MG tablet Take 1 tablet (500 mg total) by mouth every evening. 90 tablet 3  . nystatin cream (MYCOSTATIN) Apply 1 application topically 2 (two) times daily. 30 g 4  . rivaroxaban (XARELTO) 20 MG TABS tablet Take 1 tablet (20 mg total) by mouth daily. 90 tablet 1   No current facility-administered medications for this visit.    Family History  Problem Relation Age of Onset  . Alzheimer's disease Mother   . Hypertension Mother   . Alzheimer's disease Father   . Hypertension Father   . Colon cancer Father 860      deceased 827 . Hypertension Sister   . Breast cancer Sister 732 . Parkinson's disease Sister   . Hypertension Brother   . Prostate cancer Brother 780 . Prostate cancer Other        father's maternal half-brother  . Breast cancer Other 378      daughter of brother; currently 540   Review of Systems  Musculoskeletal:       Hip pain    Exam:   BP 129/68   Pulse 85   Ht '5\' 7"'  (1.702 m)   Wt 280 lb (127 kg)   LMP 01/12/2003   BMI 43.85 kg/m   Height: '5\' 7"'  (170.2 cm)  General appearance: alert, cooperative and appears stated age Breasts: normal appearance, no masses or tenderness Abdomen: soft, non-tender; bowel sounds normal; no masses,  no organomegaly Lymph nodes: Cervical, supraclavicular, and axillary nodes normal.  No abnormal inguinal nodes palpated Neurologic: Grossly normal  Pelvic: External genitalia:  no lesions              Urethra:  normal appearing urethra with no masses, tenderness or lesions              Bartholins and Skenes: normal                 Vagina: normal appearing vagina with atrophic changes and no discharge, no lesions              Cervix: absent              Pap taken: No. Bimanual Exam:  Uterus:  uterus absent              Adnexa: no mass, fullness,  tenderness  Rectovaginal: Confirms               Anus:  normal sphincter tone, no lesions  Chaperone, Prince Rome, CMA, was present for exam.  Assessment/Plan: 1. Encntr for gyn exam (general) (routine) w/o abn findings - pap smear not indicated - MMG 02/11/2020 - Colonoscopy 2016, cologuard 11/2019.  Will consider colonoscopy again if hips have more flexibility in the future - BMD 01/2020 - vaccines updated - lab work with Dr. Coletta Memos    2. Elevated ferritin - reviewed with Dr. Marin Olp last year.  As inflammation still present, will plan to check after hip surgery completed  3. 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 At high risk for breast cancer - breast MRI precert has been initiated again  5. Family history of breast cancer (sister and niece)  47. History of DVT (deep vein thrombosis) - extensive DVT  7. Obesity, morbid ([place])  8.  Genetic testing in female - FH [id number] (p.[id number]) which isclassified asVariant, Likely Pathogenic but not commonly seen with hereditary syndrome.  No specific recommendations made.

## 2020-05-06 LAB — CA 125: Cancer Antigen (CA) 125: 8.2 U/mL (ref 0.0–38.1)

## 2020-05-07 ENCOUNTER — Encounter (HOSPITAL_BASED_OUTPATIENT_CLINIC_OR_DEPARTMENT_OTHER): Payer: Self-pay

## 2020-05-12 ENCOUNTER — Encounter (HOSPITAL_BASED_OUTPATIENT_CLINIC_OR_DEPARTMENT_OTHER): Payer: Self-pay

## 2020-05-13 ENCOUNTER — Other Ambulatory Visit (HOSPITAL_BASED_OUTPATIENT_CLINIC_OR_DEPARTMENT_OTHER): Payer: Self-pay | Admitting: Obstetrics & Gynecology

## 2020-05-13 DIAGNOSIS — Z9189 Other specified personal risk factors, not elsewhere classified: Secondary | ICD-10-CM

## 2020-05-13 DIAGNOSIS — N6310 Unspecified lump in the right breast, unspecified quadrant: Secondary | ICD-10-CM

## 2020-05-16 ENCOUNTER — Encounter (HOSPITAL_BASED_OUTPATIENT_CLINIC_OR_DEPARTMENT_OTHER): Payer: Self-pay

## 2020-05-30 ENCOUNTER — Other Ambulatory Visit: Payer: Self-pay

## 2020-05-30 ENCOUNTER — Ambulatory Visit
Admission: RE | Admit: 2020-05-30 | Discharge: 2020-05-30 | Disposition: A | Discharge status: Home / Self Care | Payer: Medicare PPO | Source: Ambulatory Visit | Attending: Obstetrics & Gynecology | Admitting: Obstetrics & Gynecology

## 2020-05-30 DIAGNOSIS — Z9189 Other specified personal risk factors, not elsewhere classified: Secondary | ICD-10-CM

## 2020-05-30 MED ORDER — GADOBUTROL 1 MMOL/ML IV SOLN
10.0000 mL | Freq: Once | INTRAVENOUS | Status: AC | PRN
  Administered 2020-05-30: 10 mL via INTRAVENOUS

## 2020-06-02 NOTE — Addendum Note (Signed)
Addended by: Megan Salon on: 06/02/2020 05:19 PM   Modules accepted: Orders

## 2020-06-03 ENCOUNTER — Other Ambulatory Visit (HOSPITAL_BASED_OUTPATIENT_CLINIC_OR_DEPARTMENT_OTHER): Payer: Self-pay | Admitting: Obstetrics & Gynecology

## 2020-06-03 DIAGNOSIS — N6310 Unspecified lump in the right breast, unspecified quadrant: Secondary | ICD-10-CM

## 2020-06-03 DIAGNOSIS — Z9189 Other specified personal risk factors, not elsewhere classified: Secondary | ICD-10-CM

## 2020-06-04 ENCOUNTER — Ambulatory Visit
Admission: RE | Admit: 2020-06-04 | Discharge: 2020-06-04 | Disposition: A | Discharge status: Home / Self Care | Payer: Medicare PPO | Source: Ambulatory Visit | Attending: Obstetrics & Gynecology | Admitting: Obstetrics & Gynecology

## 2020-06-04 ENCOUNTER — Other Ambulatory Visit (HOSPITAL_BASED_OUTPATIENT_CLINIC_OR_DEPARTMENT_OTHER): Payer: Self-pay | Admitting: Obstetrics & Gynecology

## 2020-06-04 ENCOUNTER — Other Ambulatory Visit: Payer: Self-pay

## 2020-06-04 DIAGNOSIS — Z9189 Other specified personal risk factors, not elsewhere classified: Secondary | ICD-10-CM

## 2020-06-04 DIAGNOSIS — N6310 Unspecified lump in the right breast, unspecified quadrant: Secondary | ICD-10-CM

## 2020-06-06 ENCOUNTER — Encounter (HOSPITAL_BASED_OUTPATIENT_CLINIC_OR_DEPARTMENT_OTHER): Payer: Self-pay

## 2020-06-16 NOTE — Patient Instructions (Addendum)
DUE TO COVID-19 ONLY ONE VISITOR IS ALLOWED TO COME WITH YOU AND STAY IN THE WAITING ROOM ONLY DURING PRE OP AND PROCEDURE DAY OF SURGERY. THE 1 VISITOR  MAY VISIT WITH YOU AFTER SURGERY IN YOUR PRIVATE ROOM DURING VISITING HOURS ONLY!  YOU NEED TO HAVE A COVID 19 TEST ON: 06/23/20 @ 2:00 PM , THIS TEST MUST BE DONE BEFORE SURGERY,  COVID TESTING SITE [id number], IT IS ON THE RIGHT GOING OUT [address] APPROXIMATELY  2 MINUTES PAST [employer] ON THE RIGHT. ONCE YOUR COVID TEST IS COMPLETED,  PLEASE BEGIN THE QUARANTINE INSTRUCTIONS AS OUTLINED IN YOUR HANDOUT.                [place]    Your procedure is scheduled on: 06/25/20   Report to [facility] Main  Entrance   Report to admitting at: 12:30 PM     Call this number if you have problems the morning of surgery [phone]    Remember: NO SOLID FOOD AFTER MIDNIGHT THE NIGHT PRIOR TO SURGERY. NOTHING BY MOUTH EXCEPT CLEAR LIQUIDS UNTIL: 12:00 PM . PLEASE FINISH ENSURE DRINK PER SURGEON ORDER  WHICH NEEDS TO BE COMPLETED AT: 12:00 PM .  CLEAR LIQUID DIET  Foods Allowed                                                                     Foods Excluded  Coffee and tea, regular and decaf                             liquids that you cannot  Plain Jell-O any favor except red or purple                                           see through such as: Fruit ices (not with fruit pulp)                                     milk, soups, orange juice  Iced Popsicles                                    All solid food Carbonated beverages, regular and diet                                    Cranberry, grape and apple juices Sports drinks like Gatorade Lightly seasoned clear broth or consume(fat free) Sugar, honey syrup  Sample Menu Breakfast                                Lunch  Supper Cranberry juice                    Beef broth                             Chicken broth Jell-O                                     Grape juice                           Apple juice Coffee or tea                        Jell-O                                      Popsicle                                                Coffee or tea                        Coffee or tea  _____________________________________________________________________  BRUSH YOUR TEETH MORNING OF SURGERY AND RINSE YOUR MOUTH OUT, NO CHEWING GUM CANDY OR MINTS.   Take these medicines the morning of surgery with A SIP OF WATER: N/A. Use Flonase as usual.                               You may not have any metal on your body including hair pins and              piercings  Do not wear jewelry, make-up, lotions, powders or perfumes, deodorant             Do not wear nail polish on your fingernails.  Do not shave  48 hours prior to surgery.    Do not bring valuables to the hospital. [place].  Contacts, dentures or bridgework may not be worn into surgery.  Leave suitcase in the car. After surgery it may be brought to your room.     Patients discharged the day of surgery will not be allowed to drive home. IF YOU ARE HAVING SURGERY AND GOING HOME THE SAME DAY, YOU MUST HAVE AN ADULT TO DRIVE YOU HOME AND BE WITH YOU FOR 24 HOURS. YOU MAY GO HOME BY TAXI OR UBER OR ORTHERWISE, BUT AN ADULT MUST ACCOMPANY YOU HOME AND STAY WITH YOU FOR 24 HOURS.  Name and phone number of your driver:  Special Instructions: N/A              Please read over the following fact sheets you were given: _____________________________________________________________________        [facility] - Preparing for Surgery Before surgery, you can play an important role.  Because skin is not sterile, your skin needs to be as free of germs as possible.  You  can reduce the number of germs on your skin by washing with CHG (chlorahexidine gluconate) soap before surgery.  CHG is an  antiseptic cleaner which kills germs and bonds with the skin to continue killing germs even after washing. Please DO NOT use if you have an allergy to CHG or antibacterial soaps.  If your skin becomes reddened/irritated stop using the CHG and inform your nurse when you arrive at Short Stay. Do not shave (including legs and underarms) for at least 48 hours prior to the first CHG shower.  You may shave your face/neck. Please follow these instructions carefully:  1.  Shower with CHG Soap the night before surgery and the  morning of Surgery.  2.  If you choose to wash your hair, wash your hair first as usual with your  normal  shampoo.  3.  After you shampoo, rinse your hair and body thoroughly to remove the  shampoo.                           4.  Use CHG as you would any other liquid soap.  You can apply chg directly  to the skin and wash                       Gently with a scrungie or clean washcloth.  5.  Apply the CHG Soap to your body ONLY FROM THE NECK DOWN.   Do not use on face/ open                           Wound or open sores. Avoid contact with eyes, ears mouth and genitals (private parts).                       Wash face,  Genitals (private parts) with your normal soap.             6.  Wash thoroughly, paying special attention to the area where your surgery  will be performed.  7.  Thoroughly rinse your body with warm water from the neck down.  8.  DO NOT shower/wash with your normal soap after using and rinsing off  the CHG Soap.                9.  Pat yourself dry with a clean towel.            10.  Wear clean pajamas.            11.  Place clean sheets on your bed the night of your first shower and do not  sleep with pets. Day of Surgery : Do not apply any lotions/deodorants the morning of surgery.  Please wear clean clothes to the hospital/surgery center.  FAILURE TO FOLLOW THESE INSTRUCTIONS MAY RESULT IN THE CANCELLATION OF YOUR SURGERY PATIENT  SIGNATURE_________________________________  NURSE SIGNATURE__________________________________  ________________________________________________________________________   [name]  An incentive spirometer is a tool that can help keep your lungs clear and active. This tool measures how well you are filling your lungs with each breath. Taking long deep breaths may help reverse or decrease the chance of developing breathing (pulmonary) problems (especially infection) following:  A long period of time when you are unable to move or be active. BEFORE THE PROCEDURE   If the spirometer includes an indicator to show your best effort, your nurse or respiratory therapist will set  it to a desired goal.  If possible, sit up straight or lean slightly forward. Try not to slouch.  Hold the incentive spirometer in an upright position. INSTRUCTIONS FOR USE  1. Sit on the edge of your bed if possible, or sit up as far as you can in bed or on a chair. 2. Hold the incentive spirometer in an upright position. 3. Breathe out normally. 4. Place the mouthpiece in your mouth and seal your lips tightly around it. 5. Breathe in slowly and as deeply as possible, raising the piston or the ball toward the top of the column. 6. Hold your breath for 3-5 seconds or for as long as possible. Allow the piston or ball to fall to the bottom of the column. 7. Remove the mouthpiece from your mouth and breathe out normally. 8. Rest for a few seconds and repeat Steps 1 through 7 at least 10 times every 1-2 hours when you are awake. Take your time and take a few normal breaths between deep breaths. 9. The spirometer may include an indicator to show your best effort. Use the indicator as a goal to work toward during each repetition. 10. After each set of 10 deep breaths, practice coughing to be sure your lungs are clear. If you have an incision (the cut made at the time of surgery), support your incision when coughing  by placing a pillow or rolled up towels firmly against it. Once you are able to get out of bed, walk around indoors and cough well. You may stop using the incentive spirometer when instructed by your caregiver.  RISKS AND COMPLICATIONS  Take your time so you do not get dizzy or light-headed.  If you are in pain, you may need to take or ask for pain medication before doing incentive spirometry. It is harder to take a deep breath if you are having pain. AFTER USE  Rest and breathe slowly and easily.  It can be helpful to keep track of a log of your progress. Your caregiver can provide you with a simple table to help with this. If you are using the spirometer at home, follow these instructions: [place] IF:   You are having difficultly using the spirometer.  You have trouble using the spirometer as often as instructed.  Your pain medication is not giving enough relief while using the spirometer.  You develop fever of 100.5 F (38.1 C) or higher. SEEK IMMEDIATE MEDICAL CARE IF:   You cough up bloody sputum that had not been present before.  You develop fever of 102 F (38.9 C) or greater.  You develop worsening pain at or near the incision site. MAKE SURE YOU:   Understand these instructions.  Will watch your condition.  Will get help right away if you are not doing well or get worse. Document Released: 05/10/2006 Document Revised: 03/22/2011 Document Reviewed: 07/11/2006 [facility] Patient Information 2014 [place], [place].   ________________________________________________________________________

## 2020-06-17 ENCOUNTER — Other Ambulatory Visit: Payer: Self-pay

## 2020-06-17 ENCOUNTER — Encounter (HOSPITAL_COMMUNITY): Payer: Self-pay

## 2020-06-17 ENCOUNTER — Encounter (HOSPITAL_COMMUNITY)
Admission: RE | Admit: 2020-06-17 | Discharge: 2020-06-17 | Disposition: A | Discharge status: Home / Self Care | Payer: Medicare PPO | Source: Ambulatory Visit | Attending: Orthopedic Surgery | Admitting: Orthopedic Surgery

## 2020-06-17 DIAGNOSIS — Z7901 Long term (current) use of anticoagulants: Secondary | ICD-10-CM | POA: Insufficient documentation

## 2020-06-17 DIAGNOSIS — G4733 Obstructive sleep apnea (adult) (pediatric): Secondary | ICD-10-CM | POA: Insufficient documentation

## 2020-06-17 DIAGNOSIS — I10 Essential (primary) hypertension: Secondary | ICD-10-CM | POA: Insufficient documentation

## 2020-06-17 DIAGNOSIS — Z79899 Other long term (current) drug therapy: Secondary | ICD-10-CM | POA: Diagnosis not present

## 2020-06-17 DIAGNOSIS — Z86718 Personal history of other venous thrombosis and embolism: Secondary | ICD-10-CM | POA: Diagnosis not present

## 2020-06-17 DIAGNOSIS — Z01812 Encounter for preprocedural laboratory examination: Secondary | ICD-10-CM | POA: Diagnosis not present

## 2020-06-17 HISTORY — DX: Pneumonia, unspecified organism: J18.9

## 2020-06-17 LAB — CBC
HCT: 41.8 % (ref 36.0–46.0)
Hemoglobin: 13.1 g/dL (ref 12.0–15.0)
MCH: 29 pg (ref 26.0–34.0)
MCHC: 31.3 g/dL (ref 30.0–36.0)
MCV: 92.7 fL (ref 80.0–100.0)
Platelets: 185 10*3/uL (ref 150–400)
RBC: 4.51 MIL/uL (ref 3.87–5.11)
RDW: 13.2 % (ref 11.5–15.5)
WBC: 4 10*3/uL (ref 4.0–10.5)
nRBC: 0 % (ref 0.0–0.2)

## 2020-06-17 LAB — COMPREHENSIVE METABOLIC PANEL
ALT: 20 U/L (ref 0–44)
AST: 24 U/L (ref 15–41)
Albumin: 4.2 g/dL (ref 3.5–5.0)
Alkaline Phosphatase: 99 U/L (ref 38–126)
Anion gap: 7 (ref 5–15)
BUN: 17 mg/dL (ref 8–23)
CO2: 29 mmol/L (ref 22–32)
Calcium: 9.9 mg/dL (ref 8.9–10.3)
Chloride: 108 mmol/L (ref 98–111)
Creatinine, Ser: 0.71 mg/dL (ref 0.44–1.00)
GFR, Estimated: >60 mL/min (ref >60)
Glucose, Bld: 103 mg/dL — ABNORMAL HIGH (ref 70–99)
Potassium: 4.1 mmol/L (ref 3.5–5.1)
Sodium: 144 mmol/L (ref 135–145)
Total Bilirubin: 0.7 mg/dL (ref 0.3–1.2)
Total Protein: 7.5 g/dL (ref 6.5–8.1)

## 2020-06-17 LAB — SURGICAL PCR SCREEN
MRSA, PCR: NEGATIVE
Staphylococcus aureus: NEGATIVE

## 2020-06-17 LAB — PROTIME-INR
INR: 1.9 — ABNORMAL HIGH (ref 0.8–1.2)
Prothrombin Time: 21.5 seconds — ABNORMAL HIGH (ref 11.4–15.2)

## 2020-06-17 LAB — APTT: aPTT: 40 seconds — ABNORMAL HIGH (ref 24–36)

## 2020-06-17 NOTE — Progress Notes (Signed)
COVID Vaccine Completed: Yes Date COVID Vaccine completed: 10/09/19 [place] COVID vaccine manufacturer: Pfizer     PCP - Dr. Bernerd Limbo Cardiologist - NO  Chest x-ray -  EKG - 06/11/20: CEW Stress Test -  ECHO -  Cardiac Cath -  Pacemaker/ICD device last checked:  Sleep Study - Yes CPAP - Yes  Fasting Blood Sugar -  Checks Blood Sugar _____ times a day  Blood Thinner Instructions: Xarelto will be hold 24 hrs. Before surgery as per Dr. Coletta Memos instruction. Aspirin Instructions: Last Dose:  Anesthesia review: Hx: HTN,OSA(CPAP),DVT  Patient denies shortness of breath, fever, cough and chest pain at PAT appointment   Patient verbalized understanding of instructions that were given to them at the PAT appointment. Patient was also instructed that they will need to review over the PAT instructions again at home before surgery.

## 2020-06-18 NOTE — Progress Notes (Signed)
Anesthesia Chart Review:   Case: [id number] Date/Time: 06/25/20 1445   Procedure: TOTAL HIP ARTHROPLASTY ANTERIOR APPROACH (Left Hip) - 132mn   Anesthesia type: Choice   Pre-op diagnosis: left hip osteoarthritis   [facility] RO[facility] 10 / [facility]   Surgeons: AGaynelle Arabian MD       DISCUSSION: Pt is 70years old with hx HTN, DVT (per PCP notes, "extensive" in 2008, remains on anticoagulation), OSA  - PT 21.5, PTT 40.  Pt has not yet stopped xarelto. I notified CMorey Hummingbirdin Dr. APeri Marisoffice of elevated PT.   Pt to stop xarelto 24 hours before surgery. I was unable to get in touch with PCP to see if xarelto could be stopped 72 hours before surgery.    VS: BP (!) 171/76   Pulse 72   Temp 37 C (Oral)   Ht 5' 6.5" (1.689 m)   Wt 125.2 kg   LMP 01/12/2003   SpO2 97%   BMI 43.88 kg/m   PROVIDERS: - PCP is BBernerd Limbo MD. Last office visit 06/11/20 (notes in care everywhere)    LABS:  - PT 21.5, PTT 40.  Pt has not yet stopped xarelto.   (all labs ordered are listed, but only abnormal results are displayed)  Labs Reviewed  COMPREHENSIVE METABOLIC PANEL - Abnormal; Notable for the following components:      Result Value   Glucose, Bld 103 (*)    All other components within normal limits  PROTIME-INR - Abnormal; Notable for the following components:   Prothrombin Time 21.5 (*)    INR 1.9 (*)    All other components within normal limits  APTT - Abnormal; Notable for the following components:   aPTT 40 (*)    All other components within normal limits  SURGICAL PCR SCREEN  CBC  TYPE AND SCREEN    EKG 06/11/20: SR. Low voltage precordial leads. Poor R wave progression. Nonspecific T abnormality   CV: N/A  Past Medical History:  Diagnosis Date   Anemia    oral iron occ.   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   -"wears compression hose all times" left leg   FH: genetic disease carrier 11/05/2016   Genetic testing 11/04/2016   Multi-Cancer panel (83 genes) @ [facility] - see report; special interpretation   Glaucoma    Phreesia 12/19/2019   History of ovarian cancer    HTN (hypertension)    Hyperlipidemia    Hypertension    Phreesia 12/19/2019   OSA (obstructive sleep apnea)    hypoventilation, on CPAP   Osteopenia 2019   Pneumonia    Shingles    Sleep apnea    Phreesia 12/19/2019    Past Surgical History:  Procedure Laterality Date   ABDOMINAL HYSTERECTOMY     BASAL CELL CARCINOMA EXCISION     resection with plastic surgery reconstrustion   BREAST LUMPECTOMY  Left 1970   benign   CATARACT EXTRACTION Bilateral 2002, 2005   with bilateral lens replacements   COLONOSCOPY WITH PROPOFOL N/A 05/21/2014   Procedure: COLONOSCOPY WITH PROPOFOL;  Surgeon: Juanita Craver, MD;  [facility];  Service: Endoscopy;  Laterality: N/A;   EYE SURGERY     HERNIA REPAIR  2951   umbilical repair with mesh    TOTAL ABDOMINAL HYSTERECTOMY W/ BILATERAL SALPINGOOPHORECTOMY  2005   Stage IA ovarian cancer    MEDICATIONS:  acetaminophen (TYLENOL) 650 MG CR tablet   atorvastatin (LIPITOR) 40 MG tablet   Cholecalciferol (VITAMIN D) 50 MCG (2000 UT) tablet   fish oil-omega-3 fatty acids 1000 MG capsule   fluticasone (FLONASE) 50 MCG/ACT nasal spray   furosemide (LASIX) 20 MG tablet   lisinopril (ZESTRIL) 40 MG tablet   Multiple Vitamin (MULTIVITAMIN) tablet   niacin 500 MG tablet   nystatin cream (MYCOSTATIN)   OVER THE COUNTER MEDICATION   rivaroxaban (XARELTO) 20 MG TABS tablet   No current facility-administered  medications for this encounter.   - Pt to stop xarelto 24 hours before surgery.    If no changes, I anticipate pt can proceed with surgery as scheduled.   Willeen Cass, PhD, FNP-BC [facility] Short Stay Surgical Center/Anesthesiology Phone: [phone] 06/20/2020 3:14 PM

## 2020-06-18 NOTE — Progress Notes (Signed)
Lab results: PTT: 40., PT: 21.5., INR: 1.9

## 2020-06-20 NOTE — Anesthesia Preprocedure Evaluation (Addendum)
Anesthesia Evaluation  Patient identified by MRN, date of birth, ID band Patient awake    Reviewed: Allergy & Precautions, NPO status , Patient's Chart, lab work & pertinent test results  History of Anesthesia Complications Negative for: history of anesthetic complications  Airway Mallampati: II  TM Distance: >3 FB Neck ROM: Full    Dental no notable dental hx.    Pulmonary sleep apnea and Continuous Positive Airway Pressure Ventilation ,    Pulmonary exam normal        Cardiovascular hypertension, Pt. on medications + DVT (2008, remains on Xarelto )  Normal cardiovascular exam     Neuro/Psych negative neurological ROS  negative psych ROS   GI/Hepatic negative GI ROS, Neg liver ROS,   Endo/Other  Morbid obesity  Renal/GU negative Renal ROS  negative genitourinary   Musculoskeletal  (+) Arthritis ,   Abdominal   Peds  Hematology INR 1.9   Anesthesia Other Findings Day of surgery medications reviewed with patient.  Reproductive/Obstetrics negative OB ROS                          Anesthesia Physical Anesthesia Plan  ASA: 2  Anesthesia Plan: General   Post-op Pain Management:    Induction: Intravenous  PONV Risk Score and Plan: 4 or greater and Treatment may vary due to age or medical condition, Dexamethasone and Ondansetron  Airway Management Planned: LMA  Additional Equipment: None  Intra-op Plan:   Post-operative Plan: Extubation in OR  Informed Consent: I have reviewed the patients History and Physical, chart, labs and discussed the procedure including the risks, benefits and alternatives for the proposed anesthesia with the patient or authorized representative who has indicated his/her understanding and acceptance.     Dental advisory given  Plan Discussed with: CRNA  Anesthesia Plan Comments: (See APP note by Durel Salts, FNP )      Anesthesia Quick Evaluation

## 2020-06-23 ENCOUNTER — Other Ambulatory Visit (HOSPITAL_COMMUNITY)
Admission: RE | Admit: 2020-06-23 | Discharge: 2020-06-23 | Disposition: A | Discharge status: Home / Self Care | Payer: Medicare PPO | Source: Ambulatory Visit | Attending: Orthopedic Surgery | Admitting: Orthopedic Surgery

## 2020-06-23 DIAGNOSIS — Z20822 Contact with and (suspected) exposure to covid-19: Secondary | ICD-10-CM | POA: Diagnosis not present

## 2020-06-23 DIAGNOSIS — Z01812 Encounter for preprocedural laboratory examination: Secondary | ICD-10-CM | POA: Insufficient documentation

## 2020-06-23 LAB — SARS CORONAVIRUS 2 (TAT 6-24 HRS): SARS Coronavirus 2: NEGATIVE

## 2020-06-25 ENCOUNTER — Observation Stay (HOSPITAL_COMMUNITY)
Admission: RE | Admit: 2020-06-25 | Discharge: 2020-06-26 | Disposition: A | Discharge status: Home / Self Care | Payer: Medicare PPO | Attending: Orthopedic Surgery | Admitting: Orthopedic Surgery

## 2020-06-25 ENCOUNTER — Ambulatory Visit (HOSPITAL_COMMUNITY): Payer: Medicare PPO

## 2020-06-25 ENCOUNTER — Other Ambulatory Visit: Payer: Self-pay

## 2020-06-25 ENCOUNTER — Observation Stay (HOSPITAL_COMMUNITY): Payer: Medicare PPO

## 2020-06-25 ENCOUNTER — Ambulatory Visit (HOSPITAL_COMMUNITY): Payer: Medicare PPO | Admitting: Physician Assistant

## 2020-06-25 ENCOUNTER — Ambulatory Visit (HOSPITAL_COMMUNITY): Payer: Medicare PPO | Admitting: Emergency Medicine

## 2020-06-25 ENCOUNTER — Encounter (HOSPITAL_COMMUNITY): Payer: Self-pay | Admitting: Orthopedic Surgery

## 2020-06-25 ENCOUNTER — Encounter (HOSPITAL_COMMUNITY)
Admission: RE | Disposition: A | Discharge status: Home / Self Care | Payer: Self-pay | Source: Home / Self Care | Attending: Orthopedic Surgery

## 2020-06-25 DIAGNOSIS — Z85828 Personal history of other malignant neoplasm of skin: Secondary | ICD-10-CM | POA: Diagnosis not present

## 2020-06-25 DIAGNOSIS — Z96649 Presence of unspecified artificial hip joint: Secondary | ICD-10-CM

## 2020-06-25 DIAGNOSIS — Z79899 Other long term (current) drug therapy: Secondary | ICD-10-CM | POA: Insufficient documentation

## 2020-06-25 DIAGNOSIS — Z96642 Presence of left artificial hip joint: Secondary | ICD-10-CM | POA: Diagnosis present

## 2020-06-25 DIAGNOSIS — Z8543 Personal history of malignant neoplasm of ovary: Secondary | ICD-10-CM | POA: Diagnosis not present

## 2020-06-25 DIAGNOSIS — M1612 Unilateral primary osteoarthritis, left hip: Principal | ICD-10-CM | POA: Insufficient documentation

## 2020-06-25 DIAGNOSIS — Z419 Encounter for procedure for purposes other than remedying health state, unspecified: Secondary | ICD-10-CM

## 2020-06-25 DIAGNOSIS — M169 Osteoarthritis of hip, unspecified: Secondary | ICD-10-CM | POA: Diagnosis present

## 2020-06-25 DIAGNOSIS — I10 Essential (primary) hypertension: Secondary | ICD-10-CM | POA: Insufficient documentation

## 2020-06-25 DIAGNOSIS — Z7901 Long term (current) use of anticoagulants: Secondary | ICD-10-CM | POA: Insufficient documentation

## 2020-06-25 HISTORY — PX: TOTAL HIP ARTHROPLASTY: SHX124

## 2020-06-25 LAB — ABO/RH: ABO/RH(D): O POS

## 2020-06-25 LAB — TYPE AND SCREEN
ABO/RH(D): O POS
Antibody Screen: NEGATIVE

## 2020-06-25 SURGERY — ARTHROPLASTY, HIP, TOTAL, ANTERIOR APPROACH
Anesthesia: General | Site: Hip | Laterality: Left

## 2020-06-25 MED ORDER — ATORVASTATIN CALCIUM 40 MG PO TABS
40.0000 mg | ORAL_TABLET | Freq: Every day | ORAL | Status: DC
  Administered 2020-06-26: 40 mg via ORAL
  Filled 2020-06-25: qty 1

## 2020-06-25 MED ORDER — CHLORHEXIDINE GLUCONATE 0.12 % MT SOLN
15.0000 mL | Freq: Once | OROMUCOSAL | Status: AC
  Administered 2020-06-25: 15 mL via OROMUCOSAL

## 2020-06-25 MED ORDER — HYDROMORPHONE HCL 1 MG/ML IJ SOLN
0.2500 mg | INTRAMUSCULAR | Status: DC | PRN
Start: 2020-06-25 — End: 2020-06-25
  Administered 2020-06-25 (×2): 0.5 mg via INTRAVENOUS

## 2020-06-25 MED ORDER — FUROSEMIDE 20 MG PO TABS
20.0000 mg | ORAL_TABLET | Freq: Every day | ORAL | Status: DC
  Administered 2020-06-26: 20 mg via ORAL
  Filled 2020-06-25: qty 1

## 2020-06-25 MED ORDER — METHOCARBAMOL 500 MG IVPB - SIMPLE MED
500.0000 mg | Freq: Four times a day (QID) | INTRAVENOUS | Status: DC | PRN
  Administered 2020-06-25: 500 mg via INTRAVENOUS
  Filled 2020-06-25: qty 50

## 2020-06-25 MED ORDER — FENTANYL CITRATE (PF) 100 MCG/2ML IJ SOLN
INTRAMUSCULAR | Status: AC
  Filled 2020-06-25: qty 2

## 2020-06-25 MED ORDER — ACETAMINOPHEN 325 MG PO TABS: 325.0000 mg | ORAL_TABLET | Freq: Four times a day (QID) | ORAL | Status: DC | PRN

## 2020-06-25 MED ORDER — BUPIVACAINE-EPINEPHRINE (PF) 0.25% -1:200000 IJ SOLN
INTRAMUSCULAR | Status: DC | PRN
  Administered 2020-06-25: 30 mL via PERINEURAL

## 2020-06-25 MED ORDER — ONDANSETRON HCL 4 MG/2ML IJ SOLN
INTRAMUSCULAR | Status: DC | PRN
  Administered 2020-06-25: 4 mg via INTRAVENOUS

## 2020-06-25 MED ORDER — RIVAROXABAN 10 MG PO TABS
10.0000 mg | ORAL_TABLET | Freq: Every day | ORAL | Status: DC
  Administered 2020-06-26: 10 mg via ORAL
  Filled 2020-06-25: qty 1

## 2020-06-25 MED ORDER — ALBUMIN HUMAN 5 % IV SOLN
INTRAVENOUS | Status: AC
  Filled 2020-06-25: qty 250

## 2020-06-25 MED ORDER — METOCLOPRAMIDE HCL 5 MG/ML IJ SOLN
5.0000 mg | Freq: Three times a day (TID) | INTRAMUSCULAR | Status: DC | PRN
Start: 2020-06-25 — End: 2020-06-26
  Administered 2020-06-26: 10 mg via INTRAVENOUS
  Filled 2020-06-25: qty 2

## 2020-06-25 MED ORDER — MORPHINE SULFATE (PF) 4 MG/ML IV SOLN
0.5000 mg | INTRAVENOUS | Status: DC | PRN
  Administered 2020-06-26: 1 mg via INTRAVENOUS
  Filled 2020-06-25: qty 1

## 2020-06-25 MED ORDER — ACETAMINOPHEN 10 MG/ML IV SOLN
1000.0000 mg | Freq: Four times a day (QID) | INTRAVENOUS | Status: DC
  Administered 2020-06-25: 1000 mg via INTRAVENOUS
  Filled 2020-06-25: qty 100

## 2020-06-25 MED ORDER — LACTATED RINGERS IV SOLN: INTRAVENOUS | Status: DC

## 2020-06-25 MED ORDER — ORAL CARE MOUTH RINSE: 15.0000 mL | Freq: Once | OROMUCOSAL | Status: AC

## 2020-06-25 MED ORDER — PROPOFOL 10 MG/ML IV BOLUS
INTRAVENOUS | Status: AC
  Filled 2020-06-25: qty 20

## 2020-06-25 MED ORDER — CEFAZOLIN SODIUM-DEXTROSE 2-4 GM/100ML-% IV SOLN
2.0000 g | Freq: Four times a day (QID) | INTRAVENOUS | Status: AC
  Administered 2020-06-25 – 2020-06-26 (×2): 2 g via INTRAVENOUS
  Filled 2020-06-25 (×2): qty 100

## 2020-06-25 MED ORDER — ONDANSETRON HCL 4 MG/2ML IJ SOLN
INTRAMUSCULAR | Status: AC
  Filled 2020-06-25: qty 2

## 2020-06-25 MED ORDER — MAGNESIUM CITRATE PO SOLN: 1.0000 | Freq: Once | ORAL | Status: DC | PRN

## 2020-06-25 MED ORDER — ROCURONIUM BROMIDE 10 MG/ML (PF) SYRINGE
PREFILLED_SYRINGE | INTRAVENOUS | Status: AC
  Filled 2020-06-25: qty 10

## 2020-06-25 MED ORDER — TRANEXAMIC ACID-NACL 1000-0.7 MG/100ML-% IV SOLN
1000.0000 mg | INTRAVENOUS | Status: DC
  Filled 2020-06-25: qty 100

## 2020-06-25 MED ORDER — OXYCODONE HCL 5 MG/5ML PO SOLN: 5.0000 mg | Freq: Once | ORAL | Status: DC | PRN

## 2020-06-25 MED ORDER — ONDANSETRON HCL 4 MG PO TABS: 4.0000 mg | ORAL_TABLET | Freq: Four times a day (QID) | ORAL | Status: DC | PRN

## 2020-06-25 MED ORDER — WATER FOR IRRIGATION, STERILE IR SOLN
Status: DC | PRN
  Administered 2020-06-25: 2000 mL

## 2020-06-25 MED ORDER — ALBUMIN HUMAN 5 % IV SOLN: INTRAVENOUS | Status: DC | PRN

## 2020-06-25 MED ORDER — LIDOCAINE 2% (20 MG/ML) 5 ML SYRINGE
INTRAMUSCULAR | Status: AC
  Filled 2020-06-25: qty 5

## 2020-06-25 MED ORDER — SUGAMMADEX SODIUM 200 MG/2ML IV SOLN
INTRAVENOUS | Status: DC | PRN
  Administered 2020-06-25: 250 mg via INTRAVENOUS

## 2020-06-25 MED ORDER — METHOCARBAMOL 500 MG IVPB - SIMPLE MED
INTRAVENOUS | Status: AC
  Filled 2020-06-25: qty 50

## 2020-06-25 MED ORDER — POVIDONE-IODINE 10 % EX SWAB
2.0000 "application " | Freq: Once | CUTANEOUS | Status: AC
  Administered 2020-06-25: 2 via TOPICAL

## 2020-06-25 MED ORDER — PROMETHAZINE HCL 25 MG/ML IJ SOLN: 6.2500 mg | INTRAMUSCULAR | Status: DC | PRN

## 2020-06-25 MED ORDER — PROPOFOL 10 MG/ML IV BOLUS
INTRAVENOUS | Status: DC | PRN
  Administered 2020-06-25: 180 mg via INTRAVENOUS
  Administered 2020-06-25: 20 mg via INTRAVENOUS

## 2020-06-25 MED ORDER — BISACODYL 10 MG RE SUPP: 10.0000 mg | Freq: Every day | RECTAL | Status: DC | PRN

## 2020-06-25 MED ORDER — DEXAMETHASONE SODIUM PHOSPHATE 10 MG/ML IJ SOLN
INTRAMUSCULAR | Status: AC
  Filled 2020-06-25: qty 1

## 2020-06-25 MED ORDER — TRANEXAMIC ACID 1000 MG/10ML IV SOLN
INTRAVENOUS | Status: DC | PRN
  Administered 2020-06-25: 2000 mg via TOPICAL

## 2020-06-25 MED ORDER — DOCUSATE SODIUM 100 MG PO CAPS
100.0000 mg | ORAL_CAPSULE | Freq: Two times a day (BID) | ORAL | Status: DC
  Administered 2020-06-25 – 2020-06-26 (×2): 100 mg via ORAL
  Filled 2020-06-25 (×2): qty 1

## 2020-06-25 MED ORDER — ONDANSETRON HCL 4 MG/2ML IJ SOLN
4.0000 mg | Freq: Four times a day (QID) | INTRAMUSCULAR | Status: DC | PRN
  Administered 2020-06-25 – 2020-06-26 (×2): 4 mg via INTRAVENOUS
  Filled 2020-06-25 (×2): qty 2

## 2020-06-25 MED ORDER — 0.9 % SODIUM CHLORIDE (POUR BTL) OPTIME
TOPICAL | Status: DC | PRN
  Administered 2020-06-25: 1000 mL

## 2020-06-25 MED ORDER — PHENOL 1.4 % MT LIQD: 1.0000 | OROMUCOSAL | Status: DC | PRN

## 2020-06-25 MED ORDER — OXYCODONE HCL 5 MG PO TABS: 5.0000 mg | ORAL_TABLET | Freq: Once | ORAL | Status: DC | PRN

## 2020-06-25 MED ORDER — POLYETHYLENE GLYCOL 3350 17 G PO PACK: 17.0000 g | PACK | Freq: Every day | ORAL | Status: DC | PRN

## 2020-06-25 MED ORDER — PHENYLEPHRINE 40 MCG/ML (10ML) SYRINGE FOR IV PUSH (FOR BLOOD PRESSURE SUPPORT)
PREFILLED_SYRINGE | INTRAVENOUS | Status: DC | PRN
  Administered 2020-06-25 (×2): 40 ug via INTRAVENOUS

## 2020-06-25 MED ORDER — TRANEXAMIC ACID 1000 MG/10ML IV SOLN: 1000.0000 mg | Freq: Once | INTRAVENOUS | Status: DC

## 2020-06-25 MED ORDER — CEFAZOLIN IN SODIUM CHLORIDE 3-0.9 GM/100ML-% IV SOLN
3.0000 g | INTRAVENOUS | Status: DC
  Filled 2020-06-25: qty 100

## 2020-06-25 MED ORDER — METHOCARBAMOL 500 MG PO TABS
500.0000 mg | ORAL_TABLET | Freq: Four times a day (QID) | ORAL | Status: DC | PRN
  Administered 2020-06-25 – 2020-06-26 (×2): 500 mg via ORAL
  Filled 2020-06-25 (×2): qty 1

## 2020-06-25 MED ORDER — HYDROCODONE-ACETAMINOPHEN 5-325 MG PO TABS
1.0000 | ORAL_TABLET | ORAL | Status: DC | PRN
  Administered 2020-06-25 – 2020-06-26 (×4): 1 via ORAL
  Filled 2020-06-25: qty 1
  Filled 2020-06-25: qty 2
  Filled 2020-06-25 (×2): qty 1

## 2020-06-25 MED ORDER — FLUTICASONE PROPIONATE 50 MCG/ACT NA SUSP
1.0000 | Freq: Two times a day (BID) | NASAL | Status: DC
  Administered 2020-06-26: 1 via NASAL
  Filled 2020-06-25 (×2): qty 16

## 2020-06-25 MED ORDER — BUPIVACAINE-EPINEPHRINE (PF) 0.25% -1:200000 IJ SOLN
INTRAMUSCULAR | Status: AC
  Filled 2020-06-25: qty 30

## 2020-06-25 MED ORDER — SODIUM CHLORIDE 0.9 % IV SOLN: INTRAVENOUS | Status: DC

## 2020-06-25 MED ORDER — PHENYLEPHRINE HCL-NACL 10-0.9 MG/250ML-% IV SOLN
INTRAVENOUS | Status: DC | PRN
  Administered 2020-06-25: 20 ug/min via INTRAVENOUS

## 2020-06-25 MED ORDER — DEXAMETHASONE SODIUM PHOSPHATE 10 MG/ML IJ SOLN
10.0000 mg | Freq: Once | INTRAMUSCULAR | Status: AC
  Administered 2020-06-26: 10 mg via INTRAVENOUS
  Filled 2020-06-25: qty 1

## 2020-06-25 MED ORDER — FENTANYL CITRATE (PF) 100 MCG/2ML IJ SOLN
25.0000 ug | INTRAMUSCULAR | Status: DC | PRN
  Administered 2020-06-25 (×3): 50 ug via INTRAVENOUS

## 2020-06-25 MED ORDER — MENTHOL 3 MG MT LOZG: 1.0000 | LOZENGE | OROMUCOSAL | Status: DC | PRN

## 2020-06-25 MED ORDER — LIDOCAINE 2% (20 MG/ML) 5 ML SYRINGE
INTRAMUSCULAR | Status: DC | PRN
  Administered 2020-06-25: 100 mg via INTRAVENOUS

## 2020-06-25 MED ORDER — HYDROMORPHONE HCL 1 MG/ML IJ SOLN
INTRAMUSCULAR | Status: AC
  Filled 2020-06-25: qty 1

## 2020-06-25 MED ORDER — TRAMADOL HCL 50 MG PO TABS: 50.0000 mg | ORAL_TABLET | Freq: Four times a day (QID) | ORAL | Status: DC | PRN

## 2020-06-25 MED ORDER — ROCURONIUM BROMIDE 10 MG/ML (PF) SYRINGE
PREFILLED_SYRINGE | INTRAVENOUS | Status: DC | PRN
  Administered 2020-06-25: 60 mg via INTRAVENOUS
  Administered 2020-06-25 (×3): 10 mg via INTRAVENOUS

## 2020-06-25 MED ORDER — FENTANYL CITRATE (PF) 250 MCG/5ML IJ SOLN
INTRAMUSCULAR | Status: DC | PRN
  Administered 2020-06-25: 25 ug via INTRAVENOUS
  Administered 2020-06-25: 50 ug via INTRAVENOUS
  Administered 2020-06-25: 100 ug via INTRAVENOUS
  Administered 2020-06-25: 25 ug via INTRAVENOUS

## 2020-06-25 MED ORDER — TRANEXAMIC ACID 1000 MG/10ML IV SOLN
2000.0000 mg | Freq: Once | INTRAVENOUS | Status: DC
  Filled 2020-06-25: qty 20

## 2020-06-25 MED ORDER — DEXAMETHASONE SODIUM PHOSPHATE 10 MG/ML IJ SOLN
8.0000 mg | Freq: Once | INTRAMUSCULAR | Status: AC
  Administered 2020-06-25: 8 mg via INTRAVENOUS

## 2020-06-25 MED ORDER — DEXTROSE 5 % IV SOLN
INTRAVENOUS | Status: DC | PRN
  Administered 2020-06-25: 3 g via INTRAVENOUS

## 2020-06-25 MED ORDER — METOCLOPRAMIDE HCL 5 MG PO TABS
5.0000 mg | ORAL_TABLET | Freq: Three times a day (TID) | ORAL | Status: DC | PRN
Start: 2020-06-25 — End: 2020-06-26

## 2020-06-25 SURGICAL SUPPLY — 45 items
BAG DECANTER FOR FLEXI CONT (MISCELLANEOUS) ×1 IMPLANT
BAG SPEC THK2 15X12 ZIP CLS (MISCELLANEOUS)
BAG ZIPLOCK 12X15 (MISCELLANEOUS) IMPLANT
BLADE SAG 18X100X1.27 (BLADE) ×2 IMPLANT
COVER PERINEAL POST (MISCELLANEOUS) ×2 IMPLANT
COVER SURGICAL LIGHT HANDLE (MISCELLANEOUS) ×2 IMPLANT
COVER WAND RF STERILE (DRAPES) IMPLANT
CUP ACETBLR 54 OD PINNACLE (Hips) ×1 IMPLANT
DECANTER SPIKE VIAL GLASS SM (MISCELLANEOUS) ×2 IMPLANT
DRAPE FOOT SWITCH (DRAPES) ×2 IMPLANT
DRAPE STERI IOBAN 125X83 (DRAPES) ×2 IMPLANT
DRAPE U-SHAPE 47X51 STRL (DRAPES) ×4 IMPLANT
DRSG AQUACEL AG ADV 3.5X10 (GAUZE/BANDAGES/DRESSINGS) ×2 IMPLANT
DURAPREP 26ML APPLICATOR (WOUND CARE) ×2 IMPLANT
ELECT REM PT RETURN 15FT ADLT (MISCELLANEOUS) ×2 IMPLANT
GLOVE SRG 8 PF TXTR STRL LF DI (GLOVE) ×1 IMPLANT
GLOVE SURG ENC MOIS LTX SZ6.5 (GLOVE) ×2 IMPLANT
GLOVE SURG ENC MOIS LTX SZ7 (GLOVE) ×2 IMPLANT
GLOVE SURG ENC MOIS LTX SZ8 (GLOVE) ×4 IMPLANT
GLOVE SURG UNDER POLY LF SZ7 (GLOVE) ×2 IMPLANT
GLOVE SURG UNDER POLY LF SZ8 (GLOVE) ×2
GLOVE SURG UNDER POLY LF SZ8.5 (GLOVE) IMPLANT
GOWN STRL REUS W/TWL LRG LVL3 (GOWN DISPOSABLE) ×4 IMPLANT
GOWN STRL REUS W/TWL XL LVL3 (GOWN DISPOSABLE) IMPLANT
HEAD CERAMIC 36 PLUS5 (Hips) ×1 IMPLANT
HEAD CERAMIC DELTA 36 PLUS 1.5 (Hips) ×1 IMPLANT
HOLDER FOLEY CATH W/STRAP (MISCELLANEOUS) ×2 IMPLANT
KIT TURNOVER KIT A (KITS) ×2 IMPLANT
LINER MARATHON NEUT +4X54X36 (Hips) ×1 IMPLANT
MANIFOLD NEPTUNE II (INSTRUMENTS) ×2 IMPLANT
PACK ANTERIOR HIP CUSTOM (KITS) ×2 IMPLANT
PENCIL SMOKE EVACUATOR COATED (MISCELLANEOUS) ×2 IMPLANT
STEM FEM ACTIS HIGH SZ8 (Stem) ×1 IMPLANT
STRIP CLOSURE SKIN 1/2X4 (GAUZE/BANDAGES/DRESSINGS) ×2 IMPLANT
SUT ETHIBOND NAB CT1 #1 30IN (SUTURE) ×2 IMPLANT
SUT MNCRL AB 4-0 PS2 18 (SUTURE) ×2 IMPLANT
SUT STRATAFIX 0 PDS 27 VIOLET (SUTURE) ×4
SUT VIC AB 2-0 CT1 27 (SUTURE) ×8
SUT VIC AB 2-0 CT1 27XBRD (SUTURE) IMPLANT
SUT VIC AB 2-0 CT1 TAPERPNT 27 (SUTURE) ×2 IMPLANT
SUTURE STRATFX 0 PDS 27 VIOLET (SUTURE) ×1 IMPLANT
SYR 50ML LL SCALE MARK (SYRINGE) IMPLANT
TAPE STRIPS DRAPE STRL (GAUZE/BANDAGES/DRESSINGS) ×1 IMPLANT
TRAY FOLEY MTR SLVR 16FR STAT (SET/KITS/TRAYS/PACK) ×2 IMPLANT
TUBE SUCTION HIGH CAP CLEAR NV (SUCTIONS) ×2 IMPLANT

## 2020-06-25 NOTE — Anesthesia Postprocedure Evaluation (Signed)
Anesthesia Post Note  Patient: [name]  Procedure(s) Performed: TOTAL HIP ARTHROPLASTY ANTERIOR APPROACH (Left: Hip)     Patient location during evaluation: PACU Anesthesia Type: General Level of consciousness: awake and alert and oriented Pain management: pain level controlled Vital Signs Assessment: post-procedure vital signs reviewed and stable Respiratory status: spontaneous breathing, nonlabored ventilation and respiratory function stable Cardiovascular status: blood pressure returned to baseline Postop Assessment: no apparent nausea or vomiting Anesthetic complications: no   No notable events documented.  Last Vitals:  Vitals:   06/25/20 1415 06/25/20 1430  BP: (!) 120/108 134/64  Pulse: 69 70  Resp: 13 13  Temp:    SpO2: 100% 100%    Last Pain:  Vitals:   06/25/20 1411  TempSrc:   PainSc: [place]

## 2020-06-25 NOTE — Transfer of Care (Signed)
Immediate Anesthesia Transfer of Care Note  Patient: [place]  Procedure(s) Performed: TOTAL HIP ARTHROPLASTY ANTERIOR APPROACH (Left: Hip)  Patient Location: PACU  Anesthesia Type:General  Level of Consciousness: awake, alert  and patient cooperative  Airway & Oxygen Therapy: Patient Spontanous Breathing and Patient connected to face mask oxygen  Post-op Assessment: Report given to RN and Post -op Vital signs reviewed and stable  Post vital signs: Reviewed and stable  Last Vitals:  Vitals Value Taken Time  BP    Temp    Pulse 70 06/25/20 1409  Resp 16 06/25/20 1409  SpO2 100 % 06/25/20 1409  Vitals shown include unvalidated device data.  Last Pain:  Vitals:   06/25/20 1034  TempSrc: [place]  PainSc: 6          Complications: No notable events documented.

## 2020-06-25 NOTE — H&P (Addendum)
TOTAL HIP ADMISSION H&P  Patient is admitted for left total hip arthroplasty.  Subjective:  Chief Complaint: Left hip pain  HPI: [place], 70 y.o. female, has a history of pain and functional disability in the left hip due to arthritis and patient has failed non-surgical conservative treatments for greater than 12 weeks to include NSAID's and/or analgesics, weight reduction as appropriate, and activity modification. Onset of symptoms was gradual, starting  several  years ago with gradually worsening course since that time. The patient noted no past surgery on the left hip. Patient currently rates pain in the left hip at 7 out of 10 with activity. Patient has worsening of pain with activity and weight bearing, pain that interfers with activities of daily living, and pain with passive range of motion. Patient has evidence of periarticular osteophytes and joint space narrowing by imaging studies. This condition presents safety issues increasing the risk of falls.  There is no current active infection.  Patient Active Problem List   Diagnosis Date Noted   Gait disturbance 12/20/2019   Spinal stenosis in cervical region 12/20/2019   Morbid obesity with alveolar hypoventilation ([place]) 07/28/2017   Severe obstructive sleep apnea 07/28/2017   Genetic testing 11/04/2016   Family history of breast cancer    DJD (degenerative joint disease), cervical 07/28/2016   DDD (degenerative disc disease), lumbar 07/28/2016   History of DVT (deep vein thrombosis) 07/28/2016   History of ovarian cancer 07/28/2016   Primary osteoarthritis of both knees 04/20/2016   Primary osteoarthritis of both hands 04/20/2016   Primary osteoarthritis of both feet 04/20/2016   Primary osteoarthritis of both hips 04/20/2016   Vitamin D deficiency 04/20/2016   Primary open angle glaucoma of both eyes, moderate stage 12/30/2015   Hyperlipidemia 10/15/2015   Anemia 10/15/2015   Sleep apnea with use of continuous positive  airway pressure (CPAP) 07/22/2014   OSA on CPAP 07/04/2013   Obesity, morbid ([place]) 07/04/2013   Morbid obesity ([place]) 11/24/2011   Arthritis of knee 11/24/2011   Class 3 severe obesity due to excess calories without serious comorbidity with body mass index (BMI) of 50.0 to 59.9 in adult ([place]) 12/04/2010   ELECTROCARDIOGRAM, ABNORMAL 06/02/2009   Acute thromboembolism of deep veins of lower extremity ([place]) 03/26/2008   EDEMA 03/26/2008   ADENOCARCINOMA, OVARY 03/25/2008   ESSENTIAL HYPERTENSION, BENIGN 03/25/2008    Past Medical History:  Diagnosis Date   Anemia    oral iron occ.   Arthritis    Basal cell carcinoma of nose    Bronchitis    Bursitis of left hip    Cancer ([place])    Phreesia 12/19/2019   Cancer of ovary ([place]) 2005   Stage 1A, BRCA 1/2 neg 6/14   Carpal tunnel syndrome    Right hand   Cataract    Phreesia 12/19/2019   Degenerative joint disease of both hips 2019   Diverticulitis    past history   DVT (deep vein thrombosis) in pregnancy    LEFT LEG   Family history of breast cancer    Femoral DVT (deep venous thrombosis) ([place]) 2008   -"wears compression hose all times" left leg   FH: genetic disease carrier 11/05/2016   Genetic testing 11/04/2016   Multi-Cancer panel (83 genes) @ [facility] - see report; special interpretation   Glaucoma    Phreesia 12/19/2019   History of ovarian cancer    HTN (hypertension)    Hyperlipidemia    Hypertension    Phreesia 12/19/2019  OSA (obstructive sleep apnea)    hypoventilation, on CPAP   Osteopenia 2019   Pneumonia    Shingles    Sleep apnea    Phreesia 12/19/2019    Past Surgical History:  Procedure Laterality Date   ABDOMINAL HYSTERECTOMY     BASAL CELL CARCINOMA EXCISION     resection with plastic surgery reconstrustion   BREAST LUMPECTOMY Left 1970   benign   CATARACT EXTRACTION Bilateral 2002, 2005   with bilateral lens replacements   COLONOSCOPY WITH PROPOFOL N/A 05/21/2014   Procedure: COLONOSCOPY  WITH PROPOFOL;  Surgeon: Juanita Craver, MD;  Location: [facility];  Service: Endoscopy;  Laterality: N/A;   EYE SURGERY     HERNIA REPAIR  3329   umbilical repair with mesh    TOTAL ABDOMINAL HYSTERECTOMY W/ BILATERAL SALPINGOOPHORECTOMY  2005   Stage IA ovarian cancer    Prior to Admission medications   Medication Sig Start Date End Date Taking? Authorizing Provider  acetaminophen (TYLENOL) 650 MG CR tablet Take 1,300 mg by mouth every 8 (eight) hours.   Yes [provider]  atorvastatin (LIPITOR) 40 MG tablet Take 1 tablet (40 mg total) by mouth daily. 03/31/20  Yes Just, Laurita Quint, FNP  Cholecalciferol (VITAMIN D) 50 MCG (2000 UT) tablet Take 2,000 Units by mouth daily. TAKES EVERY EVE   Yes [provider]  fish oil-omega-3 fatty acids 1000 MG capsule Take 1 g by mouth 2 (two) times daily.   Yes [provider]  fluticasone (FLONASE) 50 MCG/ACT nasal spray Place 1 spray into both nostrils 2 (two) times daily. 03/31/20  Yes Just, Laurita Quint, FNP  furosemide (LASIX) 20 MG tablet Take 1 tablet (20 mg total) by mouth daily. 03/31/20  Yes Just, Laurita Quint, FNP  lisinopril (ZESTRIL) 40 MG tablet Take 1 tablet (40 mg total) by mouth daily. 03/31/20  Yes Just, Laurita Quint, FNP  Multiple Vitamin (MULTIVITAMIN) tablet Take 1 tablet by mouth every morning.   Yes [provider]  niacin 500 MG tablet Take 1 tablet (500 mg total) by mouth every evening. 03/31/20  Yes Just, Laurita Quint, FNP  nystatin cream (MYCOSTATIN) Apply 1 application topically 2 (two) times daily. Patient taking differently: Apply 1 application topically 2 (two) times daily as needed for dry skin. 03/31/20  Yes Just, Laurita Quint, FNP  OVER THE COUNTER MEDICATION Apply 1 application topically in the morning and at bedtime. Fungi Care for toenails   Yes [provider]  rivaroxaban (XARELTO) 20 MG TABS tablet Take 1 tablet (20 mg total) by mouth daily. 03/31/20  Yes Just, Laurita Quint, FNP    Allergies   Allergen Reactions   Valtrex [Valacyclovir Hcl] Swelling and Rash    Swelling of face and arm, rash   Benoxinate Base     Turns eyes blood red/burning. --in eyedrops--   Sulfa Antibiotics Nausea Only   Iodinated Diagnostic Agents Rash    Rash on back Other reaction(s): Unknown    Social History   Socioeconomic History   Marital status: Single    Spouse name: Not on file   Number of children: 0   Years of education: masters   Highest education level: Master's degree (e.g., MA, MS, MEng, MEd, MSW, MBA)  Occupational History   Occupation: Clinical research associate: [employer]    Comment: fifth grade (retired)  Tobacco Use   Smoking status: Never   Smokeless tobacco: Never   Tobacco comments:    only for  6 months in the 70s  Vaping Use   Vaping Use: Never used  Substance and Sexual Activity   Alcohol use: Not Currently    Alcohol/week: 0.0 standard drinks   Drug use: Never   Sexual activity: Not Currently    Birth control/protection: Surgical    Comment: pt strait   Other Topics Concern   Not on file  Social History Narrative   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 PSH showed an AHI of 120!!!! titrated to 8 cm watr cPAP , residual AHI of 1 and downlaod was done  01-23-11 .  excellent compliance - 7 hours and low leak,  nasal pillow mask.    BMI is considered morbidly obese.  discussed low carb  diet - weight watchers and restricted exercise. , aquatic .    Patient cannot exercise due to soreness in proximal muscles.and  would like to add coenzyme q 10 and carnitine . She needs a medical weight loss regimen - bariatric surgery?       FSS 48, Ep[place] 4 again ,  CMS compliant  residual AHI 1.1 and average use 6.48  hours , no naps    Patient is single and lives alone.   Patient does not have any children.   Patient is retired.   Patient has a Master's degree.   Patient is left-handed.   Patient drinks tea occasionally.              Social Determinants of Health   Financial Resource Strain: Not on file  Food Insecurity: Not on file  Transportation Needs: Not on file  Physical Activity: Not on file  Stress: Not on file  Social Connections: Not on file  Intimate Partner Violence: Not on file    Tobacco Use: Low Risk    Smoking Tobacco Use: Never   Smokeless Tobacco Use: Never   Social History   Substance and Sexual Activity  Alcohol Use Not Currently   Alcohol/week: 0.0 standard drinks    Family History  Problem Relation Age of Onset   Alzheimer's disease Mother    Hypertension Mother    Alzheimer's disease Father    Hypertension Father    Colon cancer Father 55       deceased 41   Hypertension Sister    Breast cancer Sister 55   Parkinson's disease Sister    Hypertension Brother    Prostate cancer Brother 59   Prostate cancer Other        father's maternal half-brother   Breast cancer Other 70       daughter of brother; currently 76    ROS: Constitutional: no fever, no chills, no night sweats, no significant weight loss Cardiovascular: no chest pain, no palpitations Respiratory: no cough, no shortness of breath, No COPD Gastrointestinal: no vomiting, no nausea Musculoskeletal: no swelling in Joints, Joint Pain Neurologic: no numbness, no tingling, no difficulty with balance    Objective:  Physical Exam: General: Alert and oriented x3, cooperative and pleasant, no acute distress.  Head: normocephalic, atraumatic, neck supple.  Eyes: EOMI.  Respiratory: breath sounds clear in all fields, no wheezing, rales, or rhonchi. Cardiovascular: Regular rate and rhythm, no murmurs, gallops or rubs.  Abdomen: non-tender to palpation and soft, normoactive bowel sounds. Musculoskeletal:  Right Hip Exam: The range of motion: Flexion to 100 degrees, Internal Rotation to 10 degrees, External Rotation to 20 degrees, and abduction to 20 degrees without discomfort. There is no tenderness over the  greater trochanteric bursa. There is no pain on provocative testing of the hip.       Left Hip Exam: The range of motion: Flexion to 100 degrees, No Internal Rotation, External Rotation to 0 degrees, and abduction to 10 degrees without discomfort. There is no tenderness over the greater trochanter bursa.       The patient's sensation and motor function are intact in their lower extremities. Their distal pulses are 2+. The bilateral calves are soft and non-tender.          Vital signs in last 24 hours:    Imaging Review AP pelvis, AP and lateral of the bilateral hips dated 01/2020 from Dr. Adline Mango office demonstrate severe end-stage arthritis of the bilateral hips that is bone-on-bone throughout with large osteophytes, collapse of the femoral head, and subchondral cysts. These x-rays were ordered, reviewed, and interpreted independently and were discussed with the patient.  Assessment/Plan:  End stage arthritis, left hip  The patient history, physical examination, clinical judgement of the provider and imaging studies are consistent with end stage degenerative joint disease of the left hip and total hip arthroplasty is deemed medically necessary. The treatment options including medical management, injection therapy, arthroscopy and arthroplasty were discussed at length. The risks and benefits of total hip arthroplasty were presented and reviewed. The risks due to aseptic loosening, infection, stiffness, dislocation/subluxation, thromboembolic complications and other imponderables were discussed. The patient acknowledged the explanation, agreed to proceed with the plan and consent was signed. Patient is being admitted for inpatient treatment for surgery, pain control, PT, OT, prophylactic antibiotics, VTE prophylaxis, progressive ambulation and ADLs and discharge planning.The patient is planning to be discharged  home .   Patient's anticipated LOS is less than 2 midnights, meeting these  requirements: - Lives within 1 hour of care - Has a competent adult at home to recover with post-op recovery - NO history of  - Chronic pain requiring opioids  - Diabetes  - Coronary Artery Disease  - Heart failure  - Heart attack  - Stroke  - Cardiac arrhythmia  - Respiratory Failure/COPD  - Renal failure  - Anemia  - Advanced Liver disease  Therapy Plans: HEP Disposition: Home with sister-in-law/brother Planned DVT Prophylaxis: Xarelto 58m (patient taking - history of blood clot; history of cancer)  DME Needed: None PCP: Patient scheduled to see Dr. BColetta Memosfor clearance on June 1. (Clearance received) TXA: IV Allergies: Benoxinate, Sulfa drugs, Valtrex, CT Contrast Anesthesia Concerns: CPAP BMI: 45.4 Last HgbA1c: N/A  [facility] [address]    - Patient was instructed on what medications to stop prior to surgery. - Follow-up visit in 2 weeks with Dr. AWynelle Link- Begin physical therapy following surgery - Pre-operative lab work as pre-surgical testing - Prescriptions will be provided in hospital at time of discharge  SFenton Foy [facility] PA-C Orthopedic Surgery [facility] [place]

## 2020-06-25 NOTE — Op Note (Signed)
OPERATIVE REPORT- TOTAL HIP ARTHROPLASTY   PREOPERATIVE DIAGNOSIS: Osteoarthritis of the Left hip.   POSTOPERATIVE DIAGNOSIS: Osteoarthritis of the Left  hip.   PROCEDURE: Left total hip arthroplasty, anterior approach.   SURGEON: Gaynelle Arabian, MD   ASSISTANT: Theresa Duty, PA-C  ANESTHESIA:  General  ESTIMATED BLOOD LOSS:-750 mL    DRAINS: Hemovac x1.   COMPLICATIONS: None   CONDITION: PACU - hemodynamically stable.   BRIEF CLINICAL NOTE: [name] is a 70 y.o. female who has advanced end-  stage arthritis of their Left  hip with progressively worsening pain and  dysfunction.The patient has failed nonoperative management and presents for  total hip arthroplasty.   PROCEDURE IN DETAIL: After successful administration of spinal  anesthetic, the traction boots for the [facility] bed were placed on both  feet and the patient was placed onto the [facility] bed, boots placed into the leg  holders. The Left hip was then isolated from the perineum with plastic  drapes and prepped and draped in the usual sterile fashion. ASIS and  greater trochanter were marked and a oblique incision was made, starting  at about 1 cm lateral and 2 cm distal to the ASIS and coursing towards  the anterior cortex of the femur. The skin was cut with a 10 blade  through subcutaneous tissue to the level of the fascia overlying the  tensor fascia lata muscle. The fascia was then incised in line with the  incision at the junction of the anterior third and posterior 2/3rd. The  muscle was teased off the fascia and then the interval between the TFL  and the rectus was developed. The Hohmann retractor was then placed at  the top of the femoral neck over the capsule. The vessels overlying the  capsule were cauterized and the fat on top of the capsule was removed.  A Hohmann retractor was then placed anterior underneath the rectus  femoris to give exposure to the entire anterior capsule. A T-shaped   capsulotomy was performed. The edges were tagged and the femoral head  was identified.       Osteophytes are removed off the superior acetabulum.  The femoral neck was then cut in situ with an oscillating saw. Traction  was then applied to the left lower extremity utilizing the [place]  traction. The femoral head was then removed. Retractors were placed  around the acetabulum and then circumferential removal of the labrum was  performed. Osteophytes were also removed. Reaming starts at 49 mm to  medialize and  Increased in 2 mm increments to 53 mm. We reamed in  approximately 40 degrees of abduction, 20 degrees anteversion. A 54 mm  pinnacle acetabular shell was then impacted in anatomic position under  fluoroscopic guidance with excellent purchase. We did not need to place  any additional dome screws. A 36 mm neutral + 4 marathon liner was then  placed into the acetabular shell.       The femoral lift was then placed along the lateral aspect of the femur  just distal to the vastus ridge. The leg was  externally rotated and capsule  was stripped off the inferior aspect of the femoral neck down to the  level of the lesser trochanter, this was done with electrocautery. The femur was lifted after this was performed. The  leg was then placed in an extended and adducted position essentially delivering the femur. We also removed the capsule superiorly and the piriformis from the piriformis  fossa to gain excellent exposure of the  proximal femur. Rongeur was used to remove some cancellous bone to get  into the lateral portion of the proximal femur for placement of the  initial starter reamer. The starter broaches was placed  the starter broach  and was shown to go down the center of the canal. Broaching  with the Actis system was then performed starting at size 0  coursing  Up to size 8. A size 8 had excellent torsional and rotational  and axial stability. The trial high offset neck was then placed   with a 36 + 5 trial head. The hip was then reduced. We confirmed that  the stem was in the canal both on AP and lateral x-rays. It also has excellent sizing. The hip was reduced with outstanding stability through full extension and full external rotation.. AP pelvis was taken and the leg lengths were measured and found to be equal. Hip was then dislocated again and the femoral head and neck removed. The  femoral broach was removed. Size 8 Actis stem with a high offset  neck was then impacted into the femur following native anteversion. Has  excellent purchase in the canal. Excellent torsional and rotational and  axial stability. It is confirmed to be in the canal on AP and lateral  fluoroscopic views. The 36 + 5 ceramic head was placed and the hip  reduced with outstanding stability. Again AP pelvis was taken and it  confirmed that the leg lengths were equal. The wound was then copiously  irrigated with saline solution and the capsule reattached and repaired  with Ethibond suture. 30 ml of .25% Bupivicaine was  injected into the capsule and into the edge of the tensor fascia lata as well as subcutaneous tissue. The fascia overlying the tensor fascia lata was then closed with a running #1 V-Loc. Subcu was closed with interrupted 2-0 Vicryl and subcuticular running 4-0 Monocryl. Incision was cleaned  and dried. Steri-Strips and a bulky sterile dressing applied. The patient was awakened and transported to  recovery in stable condition.        Please note that a surgical assistant was a medical necessity for this procedure to perform it in a safe and expeditious manner. Assistant was necessary to provide appropriate retraction of vital neurovascular structures and to prevent femoral fracture and allow for anatomic placement of the prosthesis.  Gaynelle Arabian, M.D.

## 2020-06-25 NOTE — Plan of Care (Signed)
  Problem: Clinical Measurements: Goal: Ability to maintain clinical measurements within normal limits will improve Outcome: Progressing   Problem: Activity: Goal: Risk for activity intolerance will decrease Outcome: Progressing   Problem: Nutrition: Goal: Adequate nutrition will be maintained Outcome: Progressing   Problem: Coping: Goal: Level of anxiety will decrease Outcome: Progressing   Problem: Elimination: Goal: Will not experience complications related to bowel motility Outcome: Progressing   Problem: Pain Managment: Goal: General experience of comfort will improve Outcome: Progressing   Problem: Skin Integrity: Goal: Risk for impaired skin integrity will decrease Outcome: Progressing   Problem: Pain Management: Goal: Pain level will decrease with appropriate interventions Outcome: Progressing

## 2020-06-25 NOTE — Discharge Instructions (Addendum)
Gaynelle Arabian, MD Total Joint Specialist [facility] [address], [address] [place], [place] [address] [phone]  ANTERIOR APPROACH TOTAL HIP REPLACEMENT POSTOPERATIVE DIRECTIONS     Hip Rehabilitation, Guidelines Following Surgery  The results of a hip operation are greatly improved after range of motion and muscle strengthening exercises. Follow all safety measures which are given to protect your hip. If any of these exercises cause increased pain or swelling in your joint, decrease the amount until you are comfortable again. Then slowly increase the exercises. Call your caregiver if you have problems or questions.   HOME CARE INSTRUCTIONS  Remove items at home which could result in a fall. This includes throw rugs or furniture in walking pathways.  ICE to the affected hip as frequently as 20-30 minutes an hour and then as needed for pain and swelling. Continue to use ice on the hip for pain and swelling from surgery. You may notice swelling that will progress down to the foot and ankle. This is normal after surgery. Elevate the leg when you are not up walking on it.   Continue to use the breathing machine which will help keep your temperature down.  It is common for your temperature to cycle up and down following surgery, especially at night when you are not up moving around and exerting yourself.  The breathing machine keeps your lungs expanded and your temperature down.  DIET You may resume your previous home diet once your are discharged from the hospital.  DRESSING / WOUND CARE / SHOWERING You have an adhesive waterproof bandage over the incision. Leave this in place until your first follow-up appointment. Once you remove this you will not need to place another bandage.  You may begin showering 3 days following surgery, but do not submerge the incision under water.  ACTIVITY For the first 3-5 days, it is important to rest and keep the operative leg elevated.  You should, as a general rule, rest for 50 minutes and walk/stretch for 10 minutes per hour. After 5 days, you may slowly increase activity as tolerated.  Perform the exercises you were provided twice a day for about 15-20 minutes each session. Begin these 2 days following surgery. Walk with your walker as instructed. Use the walker until you are comfortable transitioning to a cane. Walk with the cane in the opposite hand of the operative leg. You may discontinue the cane once you are comfortable and walking steadily. Avoid periods of inactivity such as sitting longer than an hour when not asleep. This helps prevent blood clots.  Do not drive a car for 6 weeks or until released by your surgeon.  Do not drive while taking narcotics.  TED HOSE STOCKINGS Wear the elastic stockings on both legs for three weeks following surgery during the day. You may remove them at night while sleeping.  WEIGHT BEARING Weight bearing as tolerated with assist device (walker, cane, etc) as directed, use it as long as suggested by your surgeon or therapist, typically at least 4-6 weeks.  POSTOPERATIVE CONSTIPATION PROTOCOL Constipation - defined medically as fewer than three stools per week and severe constipation as less than one stool per week.  One of the most common issues patients have following surgery is constipation.  Even if you have a regular bowel pattern at home, your normal regimen is likely to be disrupted due to multiple reasons following surgery.  Combination of anesthesia, postoperative narcotics, change in appetite and fluid intake all can affect your bowels.  In order to avoid complications following surgery, here are some recommendations in order to help you during your recovery period.  Colace (docusate) - Pick up an over-the-counter form of Colace or another stool softener and take twice a day as long as you are requiring postoperative pain medications.  Take with a full glass of water daily.  If  you experience loose stools or diarrhea, hold the colace until you stool forms back up.  If your symptoms do not get better within 1 week or if they get worse, check with your doctor. Dulcolax (bisacodyl) - Pick up over-the-counter and take as directed by the product packaging as needed to assist with the movement of your bowels.  Take with a full glass of water.  Use this product as needed if not relieved by Colace only.  MiraLax (polyethylene glycol) - Pick up over-the-counter to have on hand.  MiraLax is a solution that will increase the amount of water in your bowels to assist with bowel movements.  Take as directed and can mix with a glass of water, juice, soda, coffee, or tea.  Take if you go more than two days without a movement.Do not use MiraLax more than once per day. Call your doctor if you are still constipated or irregular after using this medication for 7 days in a row.  If you continue to have problems with postoperative constipation, please contact the office for further assistance and recommendations.  If you experience "the worst abdominal pain ever" or develop nausea or vomiting, please contact the office immediatly for further recommendations for treatment.  ITCHING  If you experience itching with your medications, try taking only a single pain pill, or even half a pain pill at a time.  You can also use Benadryl over the counter for itching or also to help with sleep.   MEDICATIONS See your medication summary on the "After Visit Summary" that the nursing staff will review with you prior to discharge.  You may have some home medications which will be placed on hold until you complete the course of blood thinner medication.  It is important for you to complete the blood thinner medication as prescribed by your surgeon.  Continue your approved medications as instructed at time of discharge.  PRECAUTIONS If you experience chest pain or shortness of breath - call 911 immediately for  transfer to the hospital emergency department.  If you develop a fever greater that 101 F, purulent drainage from wound, increased redness or drainage from wound, foul odor from the wound/dressing, or calf pain - CONTACT YOUR SURGEON.                                                   FOLLOW-UP APPOINTMENTS Make sure you keep all of your appointments after your operation with your surgeon and caregivers. You should call the office at the above phone number and make an appointment for approximately two weeks after the date of your surgery or on the date instructed by your surgeon outlined in the "After Visit Summary".  RANGE OF MOTION AND STRENGTHENING EXERCISES  These exercises are designed to help you keep full movement of your hip joint. Follow your caregiver's or physical therapist's instructions. Perform all exercises about fifteen times, three times per day or as directed. Exercise both hips, even if you have had only  one joint replacement. These exercises can be done on a training (exercise) mat, on the floor, on a table or on a bed. Use whatever works the best and is most comfortable for you. Use music or television while you are exercising so that the exercises are a pleasant break in your day. This will make your life better with the exercises acting as a break in routine you can look forward to.  Lying on your back, slowly slide your foot toward your buttocks, raising your knee up off the floor. Then slowly slide your foot back down until your leg is straight again.  Lying on your back spread your legs as far apart as you can without causing discomfort.  Lying on your side, raise your upper leg and foot straight up from the floor as far as is comfortable. Slowly lower the leg and repeat.  Lying on your back, tighten up the muscle in the front of your thigh (quadriceps muscles). You can do this by keeping your leg straight and trying to raise your heel off the floor. This helps strengthen the  largest muscle supporting your knee.  Lying on your back, tighten up the muscles of your buttocks both with the legs straight and with the knee bent at a comfortable angle while keeping your heel on the floor.   POST-OPERATIVE OPIOID TAPER INSTRUCTIONS: It is important to wean off of your opioid medication as soon as possible. If you do not need pain medication after your surgery it is ok to stop day one. Opioids include: Codeine, Hydrocodone(Norco, Vicodin), Oxycodone(Percocet, oxycontin) and hydromorphone amongst others.  Long term and even short term use of opiods can cause: Increased pain response Dependence Constipation Depression Respiratory depression And more.  Withdrawal symptoms can include Flu like symptoms Nausea, vomiting And more Techniques to manage these symptoms Hydrate well Eat regular healthy meals Stay active Use relaxation techniques(deep breathing, meditating, yoga) Do Not substitute Alcohol to help with tapering If you have been on opioids for less than two weeks and do not have pain than it is ok to stop all together.  Plan to wean off of opioids This plan should start within one week post op of your joint replacement. Maintain the same interval or time between taking each dose and first decrease the dose.  Cut the total daily intake of opioids by one tablet each day Next start to increase the time between doses. The last dose that should be eliminated is the evening dose.   IF YOU ARE TRANSFERRED TO A SKILLED REHAB FACILITY If the patient is transferred to a skilled rehab facility following release from the hospital, a list of the current medications will be sent to the facility for the patient to continue.  When discharged from the skilled rehab facility, please have the facility set up the patient's [place] prior to being released. Also, the skilled facility will be responsible for providing the patient with their medications at time of  release from the facility to include their pain medication, the muscle relaxants, and their blood thinner medication. If the patient is still at the rehab facility at time of the two week follow up appointment, the skilled rehab facility will also need to assist the patient in arranging follow up appointment in our office and any transportation needs.  MAKE SURE YOU:  Understand these instructions.  Get help right away if you are not doing well or get worse.    DENTAL ANTIBIOTICS:  In most  cases prophylactic antibiotics for Dental procdeures after total joint surgery are not necessary.  Exceptions are as follows:  1. History of prior total joint infection  2. Severely immunocompromised (Organ Transplant, cancer chemotherapy, Rheumatoid biologic meds such as [place])  3. Poorly controlled diabetes (A1C &gt; 8.0, blood glucose over 200)  If you have one of these conditions, contact your surgeon for an antibiotic prescription, prior to your dental procedure.    Pick up stool softner and laxative for home use following surgery while on pain medications. Do not submerge incision under water. Please use good hand washing techniques while changing dressing each day. May shower starting three days after surgery. Please use a clean towel to pat the incision dry following showers. Continue to use ice for pain and swelling after surgery. Do not use any lotions or creams on the incision until instructed by your surgeon.  Information on my medicine - XARELTO (Rivaroxaban)  This medication education was reviewed with me or my healthcare representative as part of my discharge preparation.    Why was Xarelto prescribed for you? Xarelto was prescribed for you to reduce the risk of blood clots forming after orthopedic surgery. The medical term for these abnormal blood clots is venous thromboembolism (VTE).  Aware that you are on Xarelto 20mg  qd prior to admission, using Xarelto 10mg  qd for VTE  prophylaxis after your joint surgery.   What do you need to know about xarelto ? Take your Xarelto ONCE DAILY at the same time every day. You may take it either with or without food.  If you have difficulty swallowing the tablet whole, you may crush it and mix in applesauce just prior to taking your dose.  Take Xarelto exactly as prescribed by your doctor and DO NOT stop taking Xarelto without talking to the doctor who prescribed the medication.  Stopping without other VTE prevention medication to take the place of Xarelto may increase your risk of developing a clot.  After discharge, you should have regular check-up appointments with your healthcare provider that is prescribing your Xarelto.    What do you do if you miss a dose? If you miss a dose, take it as soon as you remember on the same day then continue your regularly scheduled once daily regimen the next day. Do not take two doses of Xarelto on the same day.   Important Safety Information A possible side effect of Xarelto is bleeding. You should call your healthcare provider right away if you experience any of the following: Bleeding from an injury or your nose that does not stop. Unusual colored urine (red or dark brown) or unusual colored stools (red or black). Unusual bruising for unknown reasons. A serious fall or if you hit your head (even if there is no bleeding).  Some medicines may interact with Xarelto and might increase your risk of bleeding while on Xarelto. To help avoid this, consult your healthcare provider or pharmacist prior to using any new prescription or non-prescription medications, including herbals, vitamins, non-steroidal anti-inflammatory drugs (NSAIDs) and supplements.  This website has more information on Xarelto: [email or web address]

## 2020-06-25 NOTE — Interval H&P Note (Signed)
History and Physical Interval Note:  06/25/2020 10:47 AM  [name]  has presented today for surgery, with the diagnosis of left hip osteoarthritis.  The various methods of treatment have been discussed with the patient and family. After consideration of risks, benefits and other options for treatment, the patient has consented to  Procedure(s) with comments: [place] (Left) - 113min as a surgical intervention.  The patient's history has been reviewed, patient examined, no change in status, stable for surgery.  I have reviewed the patient's chart and labs.  Questions were answered to the patient's satisfaction.     Pilar Plate Layth Cerezo

## 2020-06-25 NOTE — Evaluation (Signed)
Physical Therapy Evaluation Patient Details Name: [name] MRN: [id number] DOB: 09/10/1950 Today's Date: 06/25/2020   History of Present Illness  Patient is 70 y.o. female s/p Lt THA anterior approach on 06/25/20 with PMH significant for OA, ovarian cnacer, DVT, anemia, HTN, HLD, Osteopenia.   Clinical Impression  [name] is a 70 y.o. female POD 0 s/p Lt THA. Patient reports independence with RW for mobility due to pain at baseline. Patient is now limited by functional impairments (see PT problem list below) and requires min assist for bed mobility and transfers with RW. Patient was able limited to stand step transfer from bed>chair due to pain and slight dizziness, BP 157/70 mmHg. Patient instructed in exercise to facilitate circulation to manage edema and reduce risk of DVT. Patient will benefit from continued skilled PT interventions to address impairments and progress towards PLOF. Acute PT will follow to progress mobility and stair training in preparation for safe discharge home.     Follow Up Recommendations Follow surgeon's recommendation for DC plan and follow-up therapies;Home health PT    Equipment Recommendations       Recommendations for Other Services       Precautions / Restrictions Precautions Precautions: Fall Restrictions Weight Bearing Restrictions: No LLE Weight Bearing: Weight bearing as tolerated Other Position/Activity Restrictions: WBAT      Mobility  Bed Mobility Overal bed mobility: Needs Assistance Bed Mobility: Supine to Sit     Supine to sit: Min assist;HOB elevated     General bed mobility comments: assist for Bil LE's to move to EOB and cues for sequencing and use of bed rail to raise trunk and pivot. Assist with bed pad to scoot forward to EOB.    Transfers Overall transfer level: Needs assistance Equipment used: Rolling walker (2 wheeled) Transfers: Sit to/from [facility] Sit to Stand: Min assist;From  elevated surface Stand pivot transfers: Min assist;From elevated surface       General transfer comment: Cues for hand placement and  assist to initiate power up and steady with rise. Pt with low foot clearance while stepping laterally for stand pivot/step transfer from bed to chair. gait deferred due to pain.  Ambulation/Gait                Stairs            Wheelchair Mobility    Modified Rankin (Stroke Patients Only)       Balance Overall balance assessment: Needs assistance Sitting-balance support: Feet supported Sitting balance-Leahy Scale: Good     Standing balance support: During functional activity;Bilateral upper extremity supported Standing balance-Leahy Scale: Poor                               Pertinent Vitals/Pain Pain Assessment: 0-10 Pain Score: 4  Pain Location: Lt Hip Pain Descriptors / Indicators: Burning Pain Intervention(s): Limited activity within patient's tolerance;Monitored during session;Premedicated before session;Repositioned;Ice applied    Home Living Family/patient expects to be discharged to:: Private residence Living Arrangements: Alone Available Help at Discharge: Family Type of Home: House Home Access: Stairs to enter Entrance Stairs-Rails: Left Entrance Stairs-Number of Steps: 2 Home Layout: One level Home Equipment: Walker - 2 wheels;Crutches;Cane - single point;Toilet riser;Walker - 4 wheels Additional Comments: pt's sister in law and borther will help her for 1-2 weeks    Prior Function Level of Independence: Independent with assistive device(s)   Gait / Transfers Assistance Needed: using  RW for several months           Hand Dominance   Dominant Hand: Right    Extremity/Trunk Assessment   Upper Extremity Assessment Upper Extremity Assessment: Overall WFL for tasks assessed    Lower Extremity Assessment Lower Extremity Assessment: Generalized weakness    Cervical / Trunk  Assessment Cervical / Trunk Assessment: Normal  Communication   Communication: No difficulties  Cognition Arousal/Alertness: Awake/alert Behavior During Therapy: WFL for tasks assessed/performed Overall Cognitive Status: Within Functional Limits for tasks assessed                                        General Comments      Exercises Total Joint Exercises Ankle Circles/Pumps: AROM;Both;20 reps;Seated   Assessment/Plan    PT Assessment Patient needs continued PT services  PT Problem List Decreased strength;Decreased range of motion;Decreased activity tolerance;Decreased balance;Decreased mobility;Decreased knowledge of use of DME;Decreased knowledge of precautions       PT Treatment Interventions Gait training;DME instruction;Functional mobility training;Stair training;Therapeutic activities;Therapeutic exercise;Balance training;Neuromuscular re-education;Patient/family education    PT Goals (Current goals can be found in the Care Plan section)  Acute Rehab PT Goals Patient Stated Goal: return home and keep hip safe PT Goal Formulation: With patient Time For Goal Achievement:  (t+7) Potential to Achieve Goals: Good    Frequency 7X/week   Barriers to discharge        Co-evaluation               AM-PAC PT "6 Clicks" Mobility  Outcome Measure Help needed turning from your back to your side while in a flat bed without using bedrails?: A Little Help needed moving from lying on your back to sitting on the side of a flat bed without using bedrails?: A Little Help needed moving to and from a bed to a chair (including a wheelchair)?: A Little Help needed standing up from a chair using your arms (e.g., wheelchair or bedside chair)?: A Little Help needed to walk in hospital room?: A Little Help needed climbing 3-5 steps with a railing? : A Lot 6 Click Score: 17    End of Session Equipment Utilized During Treatment: Gait belt Activity Tolerance: Patient  tolerated treatment well Patient left: in chair;with call bell/phone within reach;with chair alarm set;with family/visitor present Nurse Communication: Mobility status (pt nauseous, requests meds) PT Visit Diagnosis: Muscle weakness (generalized) (M62.81);Difficulty in walking, not elsewhere classified (R26.2)    Time: 0962-8366 PT Time Calculation (min) (ACUTE ONLY): 28 min   Charges:   PT Evaluation $PT Eval Low Complexity: 1 Low PT Treatments $Therapeutic Activity: 8-22 mins        Verner Mould, DPT Acute Rehabilitation Services Office [phone] Pager [phone]   Jacques Navy 06/25/2020, 7:07 PM

## 2020-06-25 NOTE — Anesthesia Procedure Notes (Signed)
Procedure Name: Intubation Date/Time: 06/25/2020 12:00 PM Performed by: Milford Cage, CRNA Pre-anesthesia Checklist: Patient identified, Emergency Drugs available, Suction available and Patient being monitored Patient Re-evaluated:Patient Re-evaluated prior to induction Oxygen Delivery Method: Circle system utilized Preoxygenation: Pre-oxygenation with 100% oxygen Induction Type: IV induction Ventilation: Mask ventilation without difficulty Laryngoscope Size: 2 Grade View: Grade I Tube type: Oral Tube size: 7.0 mm Number of attempts: 1 Airway Equipment and Method: Stylet Placement Confirmation: ETT inserted through vocal cords under direct vision, positive ETCO2 and breath sounds checked- equal and bilateral Secured at: 22 cm Tube secured with: Tape Dental Injury: Teeth and Oropharynx as per pre-operative assessment

## 2020-06-26 DIAGNOSIS — M1612 Unilateral primary osteoarthritis, left hip: Secondary | ICD-10-CM | POA: Diagnosis not present

## 2020-06-26 LAB — CBC
HCT: 32.8 % — ABNORMAL LOW (ref 36.0–46.0)
Hemoglobin: 10.4 g/dL — ABNORMAL LOW (ref 12.0–15.0)
MCH: 29.6 pg (ref 26.0–34.0)
MCHC: 31.7 g/dL (ref 30.0–36.0)
MCV: 93.4 fL (ref 80.0–100.0)
Platelets: 148 10*3/uL — ABNORMAL LOW (ref 150–400)
RBC: 3.51 MIL/uL — ABNORMAL LOW (ref 3.87–5.11)
RDW: 13.2 % (ref 11.5–15.5)
WBC: 8.6 10*3/uL (ref 4.0–10.5)
nRBC: 0 % (ref 0.0–0.2)

## 2020-06-26 LAB — BASIC METABOLIC PANEL
Anion gap: 8 (ref 5–15)
BUN: 14 mg/dL (ref 8–23)
CO2: 23 mmol/L (ref 22–32)
Calcium: 8.8 mg/dL — ABNORMAL LOW (ref 8.9–10.3)
Chloride: 107 mmol/L (ref 98–111)
Creatinine, Ser: 0.73 mg/dL (ref 0.44–1.00)
GFR, Estimated: >60 mL/min (ref >60)
Glucose, Bld: 130 mg/dL — ABNORMAL HIGH (ref 70–99)
Potassium: 3.7 mmol/L (ref 3.5–5.1)
Sodium: 138 mmol/L (ref 135–145)

## 2020-06-26 MED ORDER — METHOCARBAMOL 500 MG PO TABS: 500.0000 mg | ORAL_TABLET | Freq: Four times a day (QID) | ORAL | 0 refills | Status: DC | PRN

## 2020-06-26 MED ORDER — TRAMADOL HCL 50 MG PO TABS: 50.0000 mg | ORAL_TABLET | Freq: Four times a day (QID) | ORAL | 0 refills | Status: DC | PRN

## 2020-06-26 MED ORDER — ONDANSETRON HCL 4 MG PO TABS: 4.0000 mg | ORAL_TABLET | Freq: Four times a day (QID) | ORAL | 0 refills | Status: DC | PRN

## 2020-06-26 MED ORDER — HYDROCODONE-ACETAMINOPHEN 5-325 MG PO TABS: 1.0000 | ORAL_TABLET | Freq: Four times a day (QID) | ORAL | 0 refills | Status: DC | PRN

## 2020-06-26 NOTE — Progress Notes (Signed)
Physical Therapy Treatment Patient Details Name: [name] MRN: [id number] DOB: 1950/11/30 Today's Date: 06/26/2020    History of Present Illness Patient is 70 y.o. female s/p Lt THA anterior approach on 06/25/20 with PMH significant for OA, ovarian cnacer, DVT, anemia, HTN, HLD, Osteopenia.    PT Comments    Pt continues cooperative and progressing steadily with mobility.  Pt up to ambulate limited distance in hall, negotiated stairs, reviewed written HEP with progression exercises demonstrated, reviewed car transfers, reviewed bed mobility and reviewed LB dressing.  Pt very eager to dc home this date.  Pt and caregiver state they feel comfortable managing at pts current level as pt not moving well prior to surgery.   Follow Up Recommendations  Follow surgeon's recommendation for DC plan and follow-up therapies;Home health PT     Equipment Recommendations  3in1 (PT)    Recommendations for Other Services       Precautions / Restrictions Precautions Precautions: Fall Restrictions Weight Bearing Restrictions: No LLE Weight Bearing: Weight bearing as tolerated Other Position/Activity Restrictions: WBAT    Mobility  Bed Mobility Overal bed mobility: Needs Assistance Bed Mobility: Supine to Sit;Sit to Supine     Supine to sit: Min assist Sit to supine: Min assist;Mod assist   General bed mobility comments: cues for sequence and use of R LE to self assist.  Increased assist with LEs to return to bed    Transfers Overall transfer level: Needs assistance Equipment used: Rolling walker (2 wheeled) Transfers: Sit to/from Stand Sit to Stand: Min guard         General transfer comment: cues for LE management and use of UEs to self assist  Ambulation/Gait Ambulation/Gait assistance: Min guard;Supervision Gait Distance (Feet): 40 Feet Assistive device: Rolling walker (2 wheeled) Gait Pattern/deviations: Step-to pattern;Decreased step length - right;Decreased step  length - left;Shuffle;Trunk flexed Gait velocity: decr   General Gait Details: cues for sequence, posture and position from RW.  Pt struggling to advance L LE and did better with reverse sequence   Stairs Stairs: Yes Stairs assistance: Min assist Stair Management: Two rails;Step to pattern;Forwards Number of Stairs: 2 General stair comments: 2 steps fwd with hands at top of rails - pt has pull handle and door frame she uses at home.  Bkwd attempted but pt unable to attempt "I just can't get my head around it"   Wheelchair Mobility    Modified Rankin (Stroke Patients Only)       Balance Overall balance assessment: Needs assistance Sitting-balance support: Feet supported Sitting balance-Leahy Scale: Good     Standing balance support: During functional activity;Bilateral upper extremity supported Standing balance-Leahy Scale: Fair                              Cognition Arousal/Alertness: Awake/alert Behavior During Therapy: WFL for tasks assessed/performed Overall Cognitive Status: Within Functional Limits for tasks assessed                                        Exercises Total Joint Exercises Ankle Circles/Pumps: AROM;Both;20 reps;Seated Quad Sets: AROM;Both;10 reps;Supine Heel Slides: AAROM;Left;20 reps;Supine Hip ABduction/ADduction: AAROM;Left;15 reps;Supine Long Arc Quad: AAROM;Left;10 reps;Seated    General Comments        Pertinent Vitals/Pain Pain Assessment: 0-10 Pain Score: 4  Pain Location: Lt Hip Pain Descriptors / Indicators: Burning;Sore Pain Intervention(s):  Limited activity within patient's tolerance;Monitored during session;Premedicated before session;Ice applied    Home Living                      Prior Function            PT Goals (current goals can now be found in the care plan section) Acute Rehab PT Goals Patient Stated Goal: return home and keep hip safe PT Goal Formulation: With patient Time  For Goal Achievement: 07/03/20 Potential to Achieve Goals: Good Progress towards PT goals: Progressing toward goals    Frequency    7X/week      PT Plan Current plan remains appropriate    Co-evaluation              AM-PAC PT "6 Clicks" Mobility   Outcome Measure  Help needed turning from your back to your side while in a flat bed without using bedrails?: A Little Help needed moving from lying on your back to sitting on the side of a flat bed without using bedrails?: A Little Help needed moving to and from a bed to a chair (including a wheelchair)?: A Little Help needed standing up from a chair using your arms (e.g., wheelchair or bedside chair)?: A Little Help needed to walk in hospital room?: A Little Help needed climbing 3-5 steps with a railing? : A Little 6 Click Score: 18    End of Session Equipment Utilized During Treatment: Gait belt Activity Tolerance: Patient tolerated treatment well;Patient limited by fatigue Patient left: in chair;with call bell/phone within reach;with chair alarm set;with family/visitor present Nurse Communication: Mobility status PT Visit Diagnosis: Muscle weakness (generalized) (M62.81);Difficulty in walking, not elsewhere classified (R26.2)     Time: 1330-1420 PT Time Calculation (min) (ACUTE ONLY): 50 min  Charges:  $Gait Training: 8-22 mins $Therapeutic Exercise: 8-22 mins $Therapeutic Activity: 8-22 mins                     Debe Coder PT Acute Rehabilitation Services Pager [phone] Office [phone]    Torrez Renfroe 06/26/2020, 3:40 PM

## 2020-06-26 NOTE — Plan of Care (Signed)
  Problem: Activity: Goal: Risk for activity intolerance will decrease Outcome: Progressing   Problem: Pain Managment: Goal: General experience of comfort will improve Outcome: Progressing   Problem: Elimination: Goal: Will not experience complications related to urinary retention Outcome: Progressing

## 2020-06-26 NOTE — Progress Notes (Signed)
Foley discontinued per MD order. Patient due to void by [facility] today. Day shift Nurse updated. Patient aware to call staff to help with toileting and  BSC transfers.  Patient with request to remove ted hose and SCD from LLE for 30 min.  Call light and phone in reach. Bed exit alarm set.

## 2020-06-26 NOTE — Progress Notes (Signed)
Physical Therapy Treatment Patient Details Name: [name] MRN: [id number] DOB: 03-15-1950 Today's Date: 06/26/2020    History of Present Illness Patient is 70 y.o. female s/p Lt THA anterior approach on 06/25/20 with PMH significant for OA, ovarian cnacer, DVT, anemia, HTN, HLD, Osteopenia.    PT Comments    Pt very cooperative and progressing with mobility but fatigues easily.  Pt states this is not much different than prior to surgery.   Follow Up Recommendations  Follow surgeon's recommendation for DC plan and follow-up therapies;Home health PT     Equipment Recommendations  3in1 (PT)    Recommendations for Other Services       Precautions / Restrictions Precautions Precautions: Fall Restrictions Weight Bearing Restrictions: No LLE Weight Bearing: Weight bearing as tolerated Other Position/Activity Restrictions: WBAT    Mobility  Bed Mobility               General bed mobility comments: Pt up in chair and requests back to same    Transfers Overall transfer level: Needs assistance Equipment used: Rolling walker (2 wheeled) Transfers: Sit to/from Stand Sit to Stand: Min assist;Min guard         General transfer comment: cues for LE management and use of UEs to self assist  Ambulation/Gait Ambulation/Gait assistance: Min assist;Min guard Gait Distance (Feet): 48 Feet Assistive device: Rolling walker (2 wheeled) Gait Pattern/deviations: Step-to pattern;Decreased step length - right;Decreased step length - left;Shuffle;Trunk flexed Gait velocity: decr   General Gait Details: cues for sequence, posture and position from RW.  Pt struggling to advance L LE and did better with reverse sequence   Stairs             Wheelchair Mobility    Modified Rankin (Stroke Patients Only)       Balance Overall balance assessment: Needs assistance Sitting-balance support: Feet supported Sitting balance-Leahy Scale: Good     Standing balance  support: During functional activity;Bilateral upper extremity supported Standing balance-Leahy Scale: Poor                              Cognition Arousal/Alertness: Awake/alert Behavior During Therapy: WFL for tasks assessed/performed Overall Cognitive Status: Within Functional Limits for tasks assessed                                        Exercises Total Joint Exercises Ankle Circles/Pumps: AROM;Both;20 reps;Seated Quad Sets: AROM;Both;10 reps;Supine Heel Slides: AAROM;Left;20 reps;Supine Hip ABduction/ADduction: AAROM;Left;15 reps;Supine Long Arc Quad: AAROM;Left;10 reps;Seated    General Comments        Pertinent Vitals/Pain Pain Assessment: 0-10 Pain Score: 4  Pain Location: Lt Hip Pain Descriptors / Indicators: Burning;Sore Pain Intervention(s): Limited activity within patient's tolerance;Monitored during session;Premedicated before session;Ice applied    Home Living                      Prior Function            PT Goals (current goals can now be found in the care plan section) Acute Rehab PT Goals Patient Stated Goal: return home and keep hip safe PT Goal Formulation: With patient Potential to Achieve Goals: Good Progress towards PT goals: Progressing toward goals    Frequency    7X/week      PT Plan Current plan remains appropriate  Co-evaluation              AM-PAC PT "6 Clicks" Mobility   Outcome Measure  Help needed turning from your back to your side while in a flat bed without using bedrails?: A Little Help needed moving from lying on your back to sitting on the side of a flat bed without using bedrails?: A Little Help needed moving to and from a bed to a chair (including a wheelchair)?: A Little Help needed standing up from a chair using your arms (e.g., wheelchair or bedside chair)?: A Little Help needed to walk in hospital room?: A Little Help needed climbing 3-5 steps with a railing? : A  Lot 6 Click Score: 17    End of Session Equipment Utilized During Treatment: Gait belt Activity Tolerance: Patient tolerated treatment well;Patient limited by fatigue Patient left: in chair;with call bell/phone within reach;with chair alarm set;with family/visitor present Nurse Communication: Mobility status PT Visit Diagnosis: Muscle weakness (generalized) (M62.81);Difficulty in walking, not elsewhere classified (R26.2)     Time: 4098-1191 PT Time Calculation (min) (ACUTE ONLY): 36 min  Charges:  $Gait Training: 8-22 mins $Therapeutic Exercise: 8-22 mins                     [place] Pager [phone] Office [phone]    [facility] 06/26/2020, 12:33 PM

## 2020-06-26 NOTE — Plan of Care (Signed)

## 2020-06-26 NOTE — TOC Initial Note (Addendum)
Transition of Care [facility]) - Initial/Assessment Note   Patient Details  Name: [name] MRN: [id number] Date of Birth: 12/06/1950  Transition of Care [facility]) CM/SW Contact:    Sherie Don, LCSW Phone Number: 06/26/2020, 9:30 AM  Clinical Narrative: Patient is a 70 year old female who is under observation for total left hip arthroplasty. CSW met with patient to review discharge plan. Patient will discharge to her brother's/sister-in-law's home with a home exercise program (HEP). Patient has a rolling walker, cane, crutches, and raised toilet seat. TOC to follow.  Addendum: Patient is now requesting a 3N1. CSW made referral to [facility] with [employer]. [employer] to deliver 3N1 to patient's room. CSW updated patient's SIL.  Expected Discharge Plan: Home/Self Care Barriers to Discharge: No Barriers Identified  Patient Goals and CMS Choice Patient states their goals for this hospitalization and ongoing recovery are:: Discharge to brother's home with HEP CMS Medicare.gov Compare Post Acute Care list provided to:: Patient Choice offered to / list presented to : NA  Expected Discharge Plan and Services Expected Discharge Plan: Home/Self Care In-house Referral: Clinical Social Work Post Acute Care Choice: NA Living arrangements for the past 2 months: Single Family Home              DME Arranged: N/A DME Agency: NA  Prior Living Arrangements/Services Living arrangements for the past 2 months: Single Family Home Lives with:: Self Patient language and need for interpreter reviewed:: Yes Do you feel safe going back to the place where you live?: Yes      Need for Family Participation in Patient Care: No (Comment) Care giver support system in place?: Yes (comment) Current home services: DME (Rolling walker, cane, crutches, raised toilet seat) Criminal Activity/Legal Involvement Pertinent to Current Situation/Hospitalization: No - Comment as needed  Activities of Daily Living Home Assistive  Devices/Equipment: Walker (specify type), CPAP, Crutches ADL Screening (condition at time of admission) Patient's cognitive ability adequate to safely complete daily activities?: Yes Is the patient deaf or have difficulty hearing?: No Does the patient have difficulty seeing, even when wearing glasses/contacts?: No Does the patient have difficulty concentrating, remembering, or making decisions?: No Patient able to express need for assistance with ADLs?: Yes Does the patient have difficulty dressing or bathing?: No Independently performs ADLs?: Yes (appropriate for developmental age) Does the patient have difficulty walking or climbing stairs?: Yes Weakness of Legs: Both Weakness of Arms/Hands: None  Emotional Assessment Appearance:: Appears stated age Attitude/Demeanor/Rapport: Engaged Affect (typically observed): Accepting Orientation: : Oriented to Self, Oriented to Place, Oriented to  Time, Oriented to Situation Alcohol / Substance Use: Not Applicable Psych Involvement: No (comment)  Admission diagnosis:  S/P total left hip arthroplasty [P38.250] Patient Active Problem List   Diagnosis Date Noted   OA (osteoarthritis) of hip 06/25/2020   S/P total left hip arthroplasty 06/25/2020   Gait disturbance 12/20/2019   Spinal stenosis in cervical region 12/20/2019   Morbid obesity with alveolar hypoventilation ([place]) 07/28/2017   Severe obstructive sleep apnea 07/28/2017   Genetic testing 11/04/2016   Family history of breast cancer    DJD (degenerative joint disease), cervical 07/28/2016   DDD (degenerative disc disease), lumbar 07/28/2016   History of DVT (deep vein thrombosis) 07/28/2016   History of ovarian cancer 07/28/2016   Primary osteoarthritis of both knees 04/20/2016   Primary osteoarthritis of both hands 04/20/2016   Primary osteoarthritis of both feet 04/20/2016   Primary osteoarthritis of both hips 04/20/2016   Vitamin D deficiency 04/20/2016  Primary open angle  glaucoma of both eyes, moderate stage 12/30/2015   Hyperlipidemia 10/15/2015   Anemia 10/15/2015   Sleep apnea with use of continuous positive airway pressure (CPAP) 07/22/2014   OSA on CPAP 07/04/2013   Obesity, morbid ([place]) 07/04/2013   Morbid obesity ([place]) 11/24/2011   Arthritis of knee 11/24/2011   Class 3 severe obesity due to excess calories without serious comorbidity with body mass index (BMI) of 50.0 to 59.9 in adult ([place]) 12/04/2010   ELECTROCARDIOGRAM, ABNORMAL 06/02/2009   Acute thromboembolism of deep veins of lower extremity ([place]) 03/26/2008   EDEMA 03/26/2008   ADENOCARCINOMA, OVARY 03/25/2008   ESSENTIAL HYPERTENSION, BENIGN 03/25/2008   PCP:  Bernerd Limbo, MD Pharmacy:   [id number] [place] [id number]: [phone]: [phone] Readmission Risk Interventions No flowsheet data found.

## 2020-06-26 NOTE — Plan of Care (Signed)
Patient dc'd  

## 2020-06-26 NOTE — Progress Notes (Signed)
Subjective: 1 Day Post-Op Procedure(s) (LRB): TOTAL HIP ARTHROPLASTY ANTERIOR APPROACH (Left) Patient reports pain as moderate.   Patient seen in rounds for Dr. Wynelle Link. Patient is well, but has had some minor complaints of nausea/vomiting. No acute overnight events. Pain has been difficult to control to this point. Denies SOB, chest pain, and calf pain.  We will continue therapy today.   Objective: Vital signs in last 24 hours: Temp:  [97.8 F (36.6 C)-98.7 F (37.1 C)] 98.5 F (36.9 C) (06/16 0600) Pulse Rate:  [66-90] 76 (06/16 0600) Resp:  [11-24] 16 (06/16 0600) BP: (120-160)/(60-108) 160/60 (06/16 0600) SpO2:  [96 %-100 %] 98 % (06/16 0600) Weight:  [125.2 kg] 125.2 kg (06/15 1034)  Intake/Output from previous day:  Intake/Output Summary (Last 24 hours) at 06/26/2020 0751 Last data filed at 06/26/2020 0506 Gross per 24 hour  Intake 3731.28 ml  Output 1725 ml  Net 2006.28 ml     Intake/Output this shift: No intake/output data recorded.  Labs: Recent Labs    06/26/20 0331  HGB 10.4*   Recent Labs    06/26/20 0331  WBC 8.6  RBC 3.51*  HCT 32.8*  PLT 148*   Recent Labs    06/26/20 0331  NA 138  K 3.7  CL 107  CO2 23  BUN 14  CREATININE 0.73  GLUCOSE 130*  CALCIUM 8.8*   No results for input(s): LABPT, INR in the last 72 hours.  Exam: General - Patient is Alert and Oriented Extremity - Neurologically intact Neurovascular intact Intact pulses distally Dorsiflexion/Plantar flexion intact Dressing - dressing C/D/I Motor Function - intact, moving foot and toes well on exam.   Past Medical History:  Diagnosis Date   Anemia    oral iron occ.   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   -"wears compression hose all times" left leg   FH: genetic disease carrier 11/05/2016   Genetic testing 11/04/2016   Multi-Cancer panel (83 genes) @ [facility] - see report; special interpretation   Glaucoma    Phreesia 12/19/2019   History of ovarian cancer    HTN (hypertension)    Hyperlipidemia    Hypertension    Phreesia 12/19/2019   OSA (obstructive sleep apnea)    hypoventilation, on CPAP   Osteopenia 2019   Pneumonia    Shingles    Sleep apnea    Phreesia 12/19/2019    Assessment/Plan: 1 Day Post-Op Procedure(s) (LRB): TOTAL HIP ARTHROPLASTY ANTERIOR APPROACH (Left) Principal Problem:   OA (osteoarthritis) of hip Active Problems:   S/P total left hip arthroplasty  Estimated body mass index is 43.88 kg/m as calculated from the following:   Height as of this encounter: 5' 6.5" (1.689 m).   Weight as of this encounter: 125.2 kg. Advance diet Up with therapy  DVT Prophylaxis - Xarelto Weight bearing as tolerated. Continue therapy.  Plan is to go  Home after hospital stay.  Patient struggled with pain control yesterday and through the night. Plan for two sessions with PT today - could possibly go home today if she has better pain control this afternoon, and if PT feels like she is meeting goals.   The PDMP database was reviewed today prior to any opioid medications being prescribed to this patient.  Patient to follow up with Dr.Aluisio in clinic in two weeks.   Fenton Foy, MBA, PA-C Orthopedic Surgery 06/26/2020, 7:51 AM

## 2020-06-27 ENCOUNTER — Encounter (HOSPITAL_COMMUNITY): Payer: Self-pay | Admitting: Orthopedic Surgery

## 2020-07-02 NOTE — Discharge Summary (Signed)
Physician Discharge Summary   Patient ID: [name] MRN: [id number] DOB/AGE: 1950-03-05 70 y.o.  Admit date: 06/25/2020 Discharge date: 06/26/2020  Primary Diagnosis:  s/p Left THA  Admission Diagnoses:  Past Medical History:  Diagnosis Date   Anemia    oral iron occ.   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   -"wears compression hose all times" left leg   FH: genetic disease carrier 11/05/2016   Genetic testing 11/04/2016   Multi-Cancer panel (83 genes) @ [facility] - see report; special interpretation   Glaucoma    Phreesia 12/19/2019   History of ovarian cancer    HTN (hypertension)    Hyperlipidemia    Hypertension    Phreesia 12/19/2019   OSA (obstructive sleep apnea)    hypoventilation, on CPAP   Osteopenia 2019   Pneumonia    Shingles    Sleep apnea    Phreesia 12/19/2019   Discharge Diagnoses:   Principal Problem:   OA (osteoarthritis) of hip Active Problems:   S/P total left hip arthroplasty  Estimated body mass index is 43.88 kg/m as calculated from the following:   Height as of this encounter: 5' 6.5" (1.689 m).   Weight as of this encounter: 125.2 kg.  Procedure:  Procedure(s) (LRB): TOTAL HIP ARTHROPLASTY ANTERIOR APPROACH (Left)   Consults: None  HPI: [name] is a 70 y.o. female who has advanced end- stage arthritis of their Left  hip with progressively worsening pain and dysfunction.The patient has failed nonoperative management and presents for total hip arthroplasty.  Laboratory  Data: Admission on 06/25/2020, Discharged on 06/26/2020  Component Date Value Ref Range Status   ABO/RH(D) 06/25/2020    Final                   Value:O POS Performed at [facility] [address], [place], [place] [address]    WBC 06/26/2020 8.6  4.0 - 10.5 K/uL Final   RBC 06/26/2020 3.51 (A) 3.87 - 5.11 MIL/uL Final   Hemoglobin 06/26/2020 10.4 (A) 12.0 - 15.0 g/dL Final   HCT 06/26/2020 32.8 (A) 36.0 - 46.0 % Final   MCV 06/26/2020 93.4  80.0 - 100.0 fL Final   MCH 06/26/2020 29.6  26.0 - 34.0 pg Final   MCHC 06/26/2020 31.7  30.0 - 36.0 g/dL Final   RDW 06/26/2020 13.2  11.5 - 15.5 % Final   Platelets 06/26/2020 148 (A) 150 - 400 K/uL Final   Comment: CONSISTENT WITH PREVIOUS RESULT REPEATED TO VERIFY    nRBC 06/26/2020 0.0  0.0 - 0.2 % Final   Performed at [facility], [place] [address] [place], [place] [address]   Sodium 06/26/2020 138  135 - 145 mmol/L Final   Potassium 06/26/2020 3.7  3.5 - 5.1 mmol/L Final   Chloride 06/26/2020 107  98 - 111 mmol/L Final   CO2  06/26/2020 23  22 - 32 mmol/L Final   Glucose, Bld 06/26/2020 130 (A) 70 - 99 mg/dL Final   Glucose reference range applies only to samples taken after fasting for at least 8 hours.   BUN 06/26/2020 14  8 - 23 mg/dL Final   Creatinine, Ser 06/26/2020 0.73  0.44 - 1.00 mg/dL Final   Calcium 06/26/2020 8.8 (A) 8.9 - 10.3 mg/dL Final   GFR, Estimated 06/26/2020 >60  >60 mL/min Final   Comment: (NOTE) Calculated using the CKD-EPI Creatinine Equation (2021)    Anion gap 06/26/2020 8  5 - 15 Final   Performed at [facility], [place] [address], [place], [place] [address]  Hospital Outpatient Visit on 06/23/2020  Component Date Value Ref Range Status   SARS Coronavirus 2 06/23/2020 NEGATIVE  NEGATIVE Final   Comment: (NOTE) SARS-CoV-2 target nucleic acids are NOT DETECTED.  The SARS-CoV-2 RNA is generally detectable in upper and lower respiratory specimens  during the acute phase of infection. Negative results do not preclude SARS-CoV-2 infection, do not rule out co-infections with other pathogens, and should not be used as the sole basis for treatment or other patient management decisions. Negative results must be combined with clinical observations, patient history, and epidemiological information. The expected result is Negative.  Fact Sheet for Patients: SugarRoll.be  Fact Sheet for Healthcare Providers: https://www.woods-mathews.com/  This test is not yet approved or cleared by the [place] FDA and  has been authorized for detection and/or diagnosis of SARS-CoV-2 by FDA under an Emergency Use Authorization (EUA). This EUA will remain  in effect (meaning this test can be used) for the duration of the COVID-19 declaration under Se                          ction 564(b)(1) of the Act, 21 U.S.C. section 360bbb-3(b)(1), unless the authorization is terminated or revoked sooner.  Performed at [facility], [place] [address], [place], [place] [address]   Hospital Outpatient Visit on 06/17/2020  Component Date Value Ref Range Status   MRSA, PCR 06/17/2020 NEGATIVE  NEGATIVE Final   Staphylococcus aureus 06/17/2020 NEGATIVE  NEGATIVE Final   Comment: (NOTE) The Xpert SA Assay (FDA approved for NASAL specimens in patients 52 years of age and older), is one component of a comprehensive surveillance program. It is not intended to diagnose infection nor to guide or monitor treatment. Performed at [facility], [place] [address], [place], [place] [address]    WBC 06/17/2020 4.0  4.0 - 10.5 K/uL Final   RBC 06/17/2020 4.51  3.87 - 5.11 MIL/uL Final   Hemoglobin 06/17/2020 13.1  12.0 - 15.0 g/dL Final   HCT 06/17/2020 41.8  36.0 - 46.0 % Final   MCV 06/17/2020 92.7  80.0 - 100.0 fL Final   MCH 06/17/2020 29.0  26.0 - 34.0 pg Final   MCHC 06/17/2020 31.3  30.0 - 36.0 g/dL  Final   RDW 06/17/2020 13.2  11.5 - 15.5 % Final   Platelets 06/17/2020 185  150 - 400 K/uL Final   nRBC 06/17/2020 0.0  0.0 - 0.2 % Final   Performed at [facility], [place] [address], [place], [place] [address]   Sodium 06/17/2020 144  135 - 145 mmol/L Final   Potassium 06/17/2020 4.1  3.5 - 5.1 mmol/L Final   Chloride 06/17/2020 108  98 - 111 mmol/L Final   CO2 06/17/2020 29  22 -  32 mmol/L Final   Glucose, Bld 06/17/2020 103 (A) 70 - 99 mg/dL Final   Glucose reference range applies only to samples taken after fasting for at least 8 hours.   BUN 06/17/2020 17  8 - 23 mg/dL Final   Creatinine, Ser 06/17/2020 0.71  0.44 - 1.00 mg/dL Final   Calcium 06/17/2020 9.9  8.9 - 10.3 mg/dL Final   Total Protein 06/17/2020 7.5  6.5 - 8.1 g/dL Final   Albumin 06/17/2020 4.2  3.5 - 5.0 g/dL Final   AST 06/17/2020 24  15 - 41 U/L Final   ALT 06/17/2020 20  0 - 44 U/L Final   Alkaline Phosphatase 06/17/2020 99  38 - 126 U/L Final   Total Bilirubin 06/17/2020 0.7  0.3 - 1.2 mg/dL Final   GFR, Estimated 06/17/2020 >60  >60 mL/min Final   Comment: (NOTE) Calculated using the CKD-EPI Creatinine Equation (2021)    Anion gap 06/17/2020 7  5 - 15 Final   Performed at [facility], [place] [address] [place], [place] [address]   Prothrombin Time 06/17/2020 21.5 (A) 11.4 - 15.2 seconds Final   INR 06/17/2020 1.9 (A) 0.8 - 1.2 Final   Comment: (NOTE) INR goal varies based on device and disease states. Performed at [facility], [place] [address], [place], [place] [address]    aPTT 06/17/2020 40 (A) 24 - 36 seconds Final   Comment:        IF BASELINE aPTT IS ELEVATED, SUGGEST PATIENT RISK ASSESSMENT BE USED TO DETERMINE APPROPRIATE ANTICOAGULANT THERAPY. Performed at [facility], [place] [address] [place], [place] [address]    ABO/RH(D) 06/17/2020 O POS   Final   Antibody Screen 06/17/2020 NEG   Final   Sample Expiration 06/17/2020  06/28/2020,2359   Final   Extend sample reason 06/17/2020    Final                   Value:NO TRANSFUSIONS OR PREGNANCY IN THE PAST 3 MONTHS Performed at [facility], [place] [address], [place], [place] [address]   Orders Only on 05/05/2020  Component Date Value Ref Range Status   Cancer Antigen (CA) 125 05/05/2020 8.2  0.0 - 38.1 U/mL Final   Comment: (NOTE) Roche Diagnostics Electrochemiluminescence Immunoassay (ECLIA) Values obtained with different assay methods or kits cannot be used interchangeably.  Results cannot be interpreted as absolute evidence of the presence or absence of malignant disease. Performed At: [facility] [place], [place] [id number] Rush Farmer MD MP:[id number]      X-Rays:DG Pelvis Portable  Result Date: 06/25/2020 CLINICAL DATA:  Status post left hip replacement EXAM: PORTABLE PELVIS 1-2 VIEWS COMPARISON:  Intraoperative films from earlier in the same day. FINDINGS: Left hip prosthesis is now seen in satisfactory position. No acute fracture or acute soft tissue abnormality is noted. Severe degenerative changes of the right hip joint are noted. IMPRESSION: Status post left hip replacement. Electronically Signed   By: Inez Catalina M.D.   On: 06/25/2020 15:07   DG C-Arm 1-60 Min-No Report  Result Date: 06/25/2020 Fluoroscopy was utilized by the requesting physician.  No radiographic interpretation.   US BREAST LTD UNI RIGHT INC AXILLA  Result Date: 06/04/2020 CLINICAL DATA:  70 year old female presenting for second-look ultrasound of the right breast. On recent breast MRI there was an enhancing mass in the lower outer right breast. EXAM: ULTRASOUND OF THE RIGHT BREAST COMPARISON:  Previous exam(s). FINDINGS: Targeted  ultrasound is performed in the right breast at 9 o'clock 1 cm from nipple demonstrating an irregular hypoechoic mass just beneath the skin measuring 0.9 x 0.3 x 0.8 cm. No definite tract leading to the skin  surface. Small amount of internal vascularity. This corresponds to the mass identified on recent MRI. IMPRESSION: Indeterminate mass in the right breast at 9 o'clock measuring 0.9 cm. RECOMMENDATION: Ultrasound-guided core needle biopsy of the right breast mass at 9 o'clock. This will be performed same day. BI-RADS CATEGORY  4: Suspicious. Electronically Signed   By: Audie Pinto M.D.   On: 06/04/2020 16:13   MM CLIP PLACEMENT RIGHT  Result Date: 06/04/2020 CLINICAL DATA:  Post procedure mammogram for clip placement. EXAM: DIAGNOSTIC RIGHT MAMMOGRAM POST ULTRASOUND BIOPSY COMPARISON:  Previous exam(s). FINDINGS: Mammographic images were obtained following ultrasound guided biopsy of a mass in the right breast at 9 o'clock. The biopsy marking clip is in expected position at the site of biopsy. IMPRESSION: Appropriate positioning of the heart shaped biopsy marking clip at the site of biopsy in the right breast at 9 o'clock. Final Assessment: Post Procedure Mammograms for Marker Placement Electronically Signed   By: Audie Pinto M.D.   On: 06/04/2020 16:08   DG HIP OPERATIVE UNILAT WITH PELVIS LEFT  Result Date: 06/25/2020 CLINICAL DATA:  Left hip degenerative change EXAM: OPERATIVE LEFT HIP WITH PELVIS COMPARISON:  02/17/2017 FLUOROSCOPY TIME:  Radiation Exposure Index (as provided by the fluoroscopic device): 2.08 mGy If the device does not provide the exposure index: Fluoroscopy Time:  6 seconds Number of Acquired Images:  2 FINDINGS: Left hip prosthesis is noted in satisfactory position. No soft tissue abnormality is noted. IMPRESSION: Left hip replacement. Electronically Signed   By: Inez Catalina M.D.   On: 06/25/2020 15:08   [place] RT BREAST BX W LOC DEV 1ST LESION IMG BX SPEC US GUIDE  Addendum Date: 06/05/2020   ADDENDUM REPORT: 06/05/2020 13:38 ADDENDUM: Pathology revealed FIBROCYSTIC CHANGE WITH USUAL DUCTAL HYPERPLASIA- NEGATIVE FOR CARCINOMA of the RIGHT breast, 9 o'clock. This was found  to be concordant by Dr. Audie Pinto. Pathology results were discussed with the patient by telephone. The patient reported doing well after the biopsy with tenderness at the site. Post biopsy instructions and care were reviewed and questions were answered. The patient was encouraged to call [facility] for any additional concerns. The patient was instructed to return for annual screening mammography and informed a reminder notice would be sent regarding this appointment. Pathology results reported by Stacie Acres RN on 06/05/2020. Electronically Signed   By: Audie Pinto M.D.   On: 06/05/2020 13:38   Result Date: 06/05/2020 CLINICAL DATA:  70 year old female presenting for biopsy of a right breast mass. EXAM: ULTRASOUND GUIDED RIGHT BREAST CORE NEEDLE BIOPSY COMPARISON:  Previous exam(s). PROCEDURE: I met with the patient and we discussed the procedure of ultrasound-guided biopsy, including benefits and alternatives. We discussed the high likelihood of a successful procedure. We discussed the risks of the procedure, including infection, bleeding, tissue injury, clip migration, and inadequate sampling. Informed written consent was given. The usual time-out protocol was performed immediately prior to the procedure. Lesion quadrant: Lower outer quadrant Using sterile technique and 1% Lidocaine as local anesthetic, under direct ultrasound visualization, a 14 gauge spring-loaded device was used to perform biopsy of a mass in the right breast at 9 o'clock using a lateral approach. At the conclusion of the procedure a heart tissue marker clip was deployed into  the biopsy cavity. Follow up 2 view mammogram was performed and dictated separately. IMPRESSION: Ultrasound guided biopsy of a mass in the right breast at 9 o'clock. No apparent complications. Electronically Signed: By: Audie Pinto M.D. On: 06/04/2020 16:13    EKG: Orders placed or performed in visit on 11/27/18   EKG  12-Lead     Hospital Course: [place] is a 70 y.o. who was admitted to [facility]. They were brought to the operating room on 06/25/2020 and underwent Procedure(s): [place].  Patient tolerated the procedure well and was later transferred to the recovery room and then to the orthopaedic floor for postoperative care. They were given PO and IV analgesics for pain control following their surgery. They were given 24 hours of postoperative antibiotics of  Anti-infectives (From admission, onward)    Start     Dose/Rate Route Frequency Ordered Stop   06/25/20 1800  ceFAZolin (ANCEF) IVPB 2g/100 mL premix        2 g 200 mL/hr over 30 Minutes Intravenous Every 6 hours 06/25/20 1601 06/26/20 0040   06/25/20 1015  ceFAZolin (ANCEF) IVPB 3g/100 mL premix  Status:  Discontinued        3 g 200 mL/hr over 30 Minutes Intravenous On call to O.R. 06/25/20 1014 06/25/20 1601      and started on DVT prophylaxis in the form of Xarelto and TED hose.   PT and OT were ordered for total joint protocol. Discharge planning consulted to help with postop disposition and equipment needs.  Patient had an uneventful night on the evening of surgery. They started to get up OOB with therapy on 06/25/20. Pt was seen during rounds and was ready to go home pending progress with therapy. She worked with therapy on POD #1 and was meeting goals. Pt was discharged to home later that day in stable condition.  Diet: Regular diet Activity: WBAT Follow-up: in two weeks Disposition: Home Discharged Condition: good   Discharge Instructions     Call MD / Call 911   Complete by: As directed    If you experience chest pain or shortness of breath, CALL 911 and be transported to the hospital emergency room.  If you develope a fever above 101 F, pus (white drainage) or increased drainage or redness at the wound, or calf pain, call your surgeon's office.   Change dressing   Complete by: As  directed    You have an adhesive waterproof bandage over the incision. Leave this in place until your first follow-up appointment. Once you remove this you will not need to place another bandage.   Constipation Prevention   Complete by: As directed    Drink plenty of fluids.  Prune juice may be helpful.  You may use a stool softener, such as Colace (over the counter) 100 mg twice a day.  Use MiraLax (over the counter) for constipation as needed.   Diet - low sodium heart healthy   Complete by: As directed    Do not sit on low chairs, stoools or toilet seats, as it may be difficult to get up from low surfaces   Complete by: As directed    Driving restrictions   Complete by: As directed    No driving for two weeks   Post-operative opioid taper instructions:   Complete by: As directed    POST-OPERATIVE OPIOID TAPER INSTRUCTIONS: It is important to wean off of your opioid medication as soon as possible.  If you do not need pain medication after your surgery it is ok to stop day one. Opioids include: Codeine, Hydrocodone(Norco, Vicodin), Oxycodone(Percocet, oxycontin) and hydromorphone amongst others.  Long term and even short term use of opiods can cause: Increased pain response Dependence Constipation Depression Respiratory depression And more.  Withdrawal symptoms can include Flu like symptoms Nausea, vomiting And more Techniques to manage these symptoms Hydrate well Eat regular healthy meals Stay active Use relaxation techniques(deep breathing, meditating, yoga) Do Not substitute Alcohol to help with tapering If you have been on opioids for less than two weeks and do not have pain than it is ok to stop all together.  Plan to wean off of opioids This plan should start within one week post op of your joint replacement. Maintain the same interval or time between taking each dose and first decrease the dose.  Cut the total daily intake of opioids by one tablet each day Next start  to increase the time between doses. The last dose that should be eliminated is the evening dose.      TED hose   Complete by: As directed    Use stockings (TED hose) for three weeks on both leg(s).  You may remove them at night for sleeping.   Weight bearing as tolerated   Complete by: As directed       Allergies as of 06/26/2020       Reactions   Valtrex [valacyclovir Hcl] Swelling, Rash   Swelling of face and arm, rash   Benoxinate Base    Turns eyes blood red/burning. --in eyedrops--   Sulfa Antibiotics Nausea Only   Iodinated Diagnostic Agents Rash   Rash on back Other reaction(s): Unknown        Medication List     STOP taking these medications    acetaminophen 650 MG CR tablet Commonly known as: TYLENOL       TAKE these medications    atorvastatin 40 MG tablet Commonly known as: LIPITOR Take 1 tablet (40 mg total) by mouth daily.   fish oil-omega-3 fatty acids 1000 MG capsule Take 1 g by mouth 2 (two) times daily.   fluticasone 50 MCG/ACT nasal spray Commonly known as: FLONASE Place 1 spray into both nostrils 2 (two) times daily.   furosemide 20 MG tablet Commonly known as: LASIX Take 1 tablet (20 mg total) by mouth daily.   HYDROcodone-acetaminophen 5-325 MG tablet Commonly known as: NORCO/VICODIN Take 1-2 tablets by mouth every 6 (six) hours as needed for severe pain.   lisinopril 40 MG tablet Commonly known as: ZESTRIL Take 1 tablet (40 mg total) by mouth daily.   methocarbamol 500 MG tablet Commonly known as: ROBAXIN Take 1 tablet (500 mg total) by mouth every 6 (six) hours as needed for muscle spasms.   multivitamin tablet Take 1 tablet by mouth every morning.   niacin 500 MG tablet Take 1 tablet (500 mg total) by mouth every evening.   nystatin cream Commonly known as: MYCOSTATIN Apply 1 application topically 2 (two) times daily. What changed:  when to take this reasons to take this   ondansetron 4 MG tablet Commonly known  as: ZOFRAN Take 1 tablet (4 mg total) by mouth every 6 (six) hours as needed for nausea.   OVER THE COUNTER MEDICATION Apply 1 application topically in the morning and at bedtime. Fungi Care for toenails   rivaroxaban 20 MG Tabs tablet Commonly known as: Xarelto Take 1 tablet (20 mg total) by mouth daily.  traMADol 50 MG tablet Commonly known as: ULTRAM Take 1-2 tablets (50-100 mg total) by mouth every 6 (six) hours as needed for moderate pain.   Vitamin D 50 MCG (2000 UT) tablet Take 2,000 Units by mouth daily. TAKES EVERY EVE               Discharge Care Instructions  (From admission, onward)           Start     Ordered   06/26/20 0000  Weight bearing as tolerated        06/26/20 1335   06/26/20 0000  Change dressing       Comments: You have an adhesive waterproof bandage over the incision. Leave this in place until your first follow-up appointment. Once you remove this you will not need to place another bandage.   06/26/20 1335            Follow-up Information     Gaynelle Arabian, MD. Schedule an appointment as soon as possible for a visit on 07/08/2020.   Specialty: Orthopedic Surgery Contact information: [address] [place] [address] [phone]                 Signed: Fenton Foy, MBA, PA-C Orthopedic Surgery 07/02/2020, 7:39 AM

## 2020-07-29 ENCOUNTER — Telehealth: Payer: Self-pay | Admitting: Neurology

## 2020-07-29 NOTE — Telephone Encounter (Signed)
Pt called, is it possible for drive by the office today to have chip for my CPAP machine read before appt on 7/21. Would like a call from the nurse.

## 2020-07-29 NOTE — Telephone Encounter (Signed)
Called and spoke to pt, that we do not need her SD card. We are able to view/download online. Pt verbalized understanding and had no further questions at this time.

## 2020-07-31 ENCOUNTER — Encounter: Payer: Self-pay | Admitting: Family Medicine

## 2020-07-31 ENCOUNTER — Telehealth (INDEPENDENT_AMBULATORY_CARE_PROVIDER_SITE_OTHER): Payer: Medicare PPO | Admitting: Family Medicine

## 2020-07-31 DIAGNOSIS — Z9989 Dependence on other enabling machines and devices: Secondary | ICD-10-CM | POA: Diagnosis not present

## 2020-07-31 DIAGNOSIS — G4733 Obstructive sleep apnea (adult) (pediatric): Secondary | ICD-10-CM

## 2020-07-31 NOTE — Patient Instructions (Signed)

## 2020-07-31 NOTE — Progress Notes (Signed)
PATIENT: [name] DOB: [birth year]  REASON FOR VISIT: follow up HISTORY FROM: patient  Virtual Visit via Telephone Note  I connected with [place] on 07/31/20 at  1:00 PM EDT by telephone and verified that I am speaking with the correct person using two identifiers.   I discussed the limitations, risks, security and privacy concerns of performing an evaluation and management service by telephone and the availability of in person appointments. I also discussed with the patient that there may be a patient responsible charge related to this service. The patient expressed understanding and agreed to proceed.   History of Present Illness:  07/31/20 ALL: [name] is a 70 y.o. female here today for follow up for OSA on CPAP. She continues to do well on therapy. She is using CPAP nightly. 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 She feels that she is doing fairly well. She is planning to have the right hip replaced in September. She has had some difficulty communicating with DME but feels she has gotten her issues resolved.     History (copied from Dr Dohmeier's previous note)   HPI:  [name] is a 70 y.o. female  here for CPAP compliance, seen in a virtual visit on 01/15/19  [name] has rented a new CPAP machine and 2 days compliance visit is also needed to allow her to purchase her machine all the right.  She is now using an air sense auto sense with 8 cm water pressure to centimeter EPR and a residual AHI of only 1.3/h.  She has been 100% compliant with an average use at time of 7 hours 28 minutes.  Her risk factors for Covid are increased as she is 70 years old, her BMI is over 35, and she has obstructive sleep apnea with a significant amount of sleep hypoxemia.  We discussed today that she should go through the website of the [facility] and register for vaccine, as she is likely in group 2 a or 2  be.   She informed me that she has a new health insurance and new [employer] plan and wants to make sure that our visits and her CPAP supplies are properly billed.     Observations/Objective:  Generalized: Well developed, in no acute distress  Mentation: Alert oriented to time, place, history taking. Follows all commands speech and language fluent   Assessment and Plan:  70 y.o. 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 here with    ICD-10-CM   1. OSA on CPAP  G47.33 For home use only DME continuous positive airway pressure (CPAP)   Z99.89       [name] is doing very well on CPAP. She was encouraged to continue CPAP nightly and greater than 4 hours each night. She will continue to reach out to DME with any needs and will notify me if she is not able to resolve situation. She will continue close follow up with PCP  and return to see [place] in 1 year. She verbalizes understanding and agreement with this plan.   Orders Placed This Encounter  Procedures   For home use only DME continuous positive airway pressure (CPAP)    Supplies    Order Specific Question:   Length of Need    Answer:   Lif[employer]    Order Specific Question:   Patient has OSA or probable OSA    Answer:   Yes    Order Specific Question:   Is the patient currently using CPAP in the home    Answer:   Yes    Order Specific Question:   Settings     Answer:   Other see comments    Order Specific Question:   CPAP supplies needed    Answer:   Mask, headgear, cushions, filters, heated tubing and water chamber     No orders of the defined types were placed in this encounter.    Follow Up Instructions:  I discussed the assessment and treatment plan with the patient. The patient was provided an opportunity to ask questions and all were answered. The patient agreed with the plan and demonstrated an understanding of the instructions.   The patient was advised to call back or seek an in-person evaluation if the symptoms worsen or if the condition fails to improve as anticipated.  I provided 20 minutes of non-face-to-face time during this encounter. Patient located at their place of residence during [place] visit. Provider is in the office.    Debbora Presto, NP

## 2020-09-17 NOTE — Patient Instructions (Addendum)
DUE TO COVID-19 ONLY ONE VISITOR IS ALLOWED TO COME WITH YOU AND STAY IN THE WAITING ROOM ONLY DURING PRE OP AND PROCEDURE.   **NO VISITORS ARE ALLOWED IN THE SHORT STAY AREA OR RECOVERY ROOM!!**  IF YOU WILL BE ADMITTED INTO THE HOSPITAL YOU ARE ALLOWED ONLY TWO SUPPORT PEOPLE DURING VISITATION HOURS ONLY (10AM -8PM)   The support person(s) may change daily. The support person(s) must pass our screening, gel in and out, and wear a mask at all times, including in the patient's room. Patients must also wear a mask when staff or their support person are in the room.  No visitors under the age of 80. Any visitor under the age of 27 must be accompanied by an adult.   COVID SWAB TESTING MUST BE COMPLETED ON:  Monday, 09-29-20 Between the hours of 8 and 3  **MUST PRESENT COMPLETED FORM AT TESTING SITE**    [name] (backside of the building)  You are not required to quarantine, however you are required to wear a well-fitted mask when you are out and around people not in your household.  Hand Hygiene often Do NOT share personal items Notify your provider if you are in close contact with someone who has COVID or you develop fever 100.4 or greater, new onset of sneezing, cough, sore throat, shortness of breath or body aches.         Your procedure is scheduled on:  Wednesday, 10-01-20   Report to [facility] Main  Entrance    Report to admitting at 11:15 AM   Call this number if you have problems the morning of surgery [phone]   Do not eat food :After Midnight.   May have liquids until 11:00 AM day of surgery  CLEAR LIQUID DIET  Foods Allowed                                                                     Foods Excluded  Water, Black Coffee (no milk/no creamer) and tea, regular and decaf                              liquids that you cannot  Plain Jell-O in any flavor  (No red)                         see through such as: Fruit ices (not with  fruit pulp)                                 milk, soups, orange juice  Iced Popsicles (No red)                                    All solid food                             Apple juices Sports drinks like Gatorade (No red) Lightly seasoned clear broth or consume(fat free) Sugar    Complete one  Ensure drink the morning of surgery at 11:00 AM  the day of surgery.     The day of surgery:  Drink ONE (1) Pre-Surgery Clear Ensure the morning of surgery. Drink in one sitting. Do not sip.  This drink was given to you during your hospital  pre-op appointment visit. Nothing else to drink after completing the G2.          If you have questions, please contact your surgeon's office.     Oral Hygiene is also important to reduce your risk of infection.                                    Remember - BRUSH YOUR TEETH THE MORNING OF SURGERY WITH YOUR REGULAR TOOTHPASTE   Do NOT smoke after Midnight   Take these medicines the morning of surgery with A SIP OF WATER:  Acetaminophen, Atorvastatin                    Xarelto - hold 1 day prior to surgery   Bring CPAP mask and tubing day of surgery             You may not have any metal on your body including hair pins, jewelry, and body piercing             Do not wear make-up, lotions, powders, perfumes or deodorant  Do not wear nail polish including gel and S&S, artificial/acrylic nails, or any other type of covering on natural nails including finger and toenails. If you have artificial nails, gel coating, etc. that needs to be removed by a nail salon please have this removed prior to surgery or surgery may need to be canceled/ delayed if the surgeon/ anesthesia feels like they are unable to be safely monitored.   Do not shave  48 hours prior to surgery.   Do not bring valuables to the hospital. [name].   Contacts, dentures or bridgework may not be worn into surgery.   Bring small overnight bag day of  surgery.   Please read over the following fact sheets you were given: IF YOU HAVE QUESTIONS ABOUT YOUR PRE OP INSTRUCTIONS PLEASE CALL [facility] - Preparing for Surgery Before surgery, you can play an important role.  Because skin is not sterile, your skin needs to be as free of germs as possible.  You can reduce the number of germs on your skin by washing with CHG (chlorahexidine gluconate) soap before surgery.  CHG is an antiseptic cleaner which kills germs and bonds with the skin to continue killing germs even after washing. Please DO NOT use if you have an allergy to CHG or antibacterial soaps.  If your skin becomes reddened/irritated stop using the CHG and inform your nurse when you arrive at Short Stay. Do not shave (including legs and underarms) for at least 48 hours prior to the first CHG shower.  You may shave your face/neck.  Please follow these instructions carefully:  1.  Shower with CHG Soap the night before surgery and the  morning of surgery.  2.  If you choose to wash your hair, wash your hair first as usual with your normal  shampoo.  3.  After you shampoo, rinse your hair and body thoroughly to remove the shampoo.  4.  Use CHG as you would any other liquid soap.  You can apply chg directly to the skin and wash.  Gently with a scrungie or clean washcloth.  5.  Apply the CHG Soap to your body ONLY FROM THE NECK DOWN.   Do   not use on face/ open                           Wound or open sores. Avoid contact with eyes, ears mouth and   genitals (private parts).                       Wash face,  Genitals (private parts) with your normal soap.             6.  Wash thoroughly, paying special attention to the area where your    surgery  will be performed.  7.  Thoroughly rinse your body with warm water from the neck down.  8.  DO NOT shower/wash with your normal soap after using and rinsing off the CHG Soap.                9.  Pat  yourself dry with a clean towel.            10.  Wear clean pajamas.            11.  Place clean sheets on your bed the night of your first shower and do not  sleep with pets. Day of Surgery : Do not apply any lotions/deodorants the morning of surgery.  Please wear clean clothes to the hospital/surgery center.  FAILURE TO FOLLOW THESE INSTRUCTIONS MAY RESULT IN THE CANCELLATION OF YOUR SURGERY  PATIENT SIGNATURE_________________________________  NURSE SIGNATURE__________________________________  ________________________________________________________________________   [name]  An incentive spirometer is a tool that can help keep your lungs clear and active. This tool measures how well you are filling your lungs with each breath. Taking long deep breaths may help reverse or decrease the chance of developing breathing (pulmonary) problems (especially infection) following: A long period of time when you are unable to move or be active. BEFORE THE PROCEDURE  If the spirometer includes an indicator to show your best effort, your nurse or respiratory therapist will set it to a desired goal. If possible, sit up straight or lean slightly forward. Try not to slouch. Hold the incentive spirometer in an upright position. INSTRUCTIONS FOR USE  Sit on the edge of your bed if possible, or sit up as far as you can in bed or on a chair. Hold the incentive spirometer in an upright position. Breathe out normally. Place the mouthpiece in your mouth and seal your lips tightly around it. Breathe in slowly and as deeply as possible, raising the piston or the ball toward the top of the column. Hold your breath for 3-5 seconds or for as long as possible. Allow the piston or ball to fall to the bottom of the column. Remove the mouthpiece from your mouth and breathe out normally. Rest for a few seconds and repeat Steps 1 through 7 at least 10 times every 1-2 hours when you are awake. Take your time  and take a few normal breaths between deep breaths. The spirometer may include an indicator to show your best effort. Use the indicator as a goal to work toward during each repetition. After each set of 10 deep breaths, practice coughing to be sure  your lungs are clear. If you have an incision (the cut made at the time of surgery), support your incision when coughing by placing a pillow or rolled up towels firmly against it. Once you are able to get out of bed, walk around indoors and cough well. You may stop using the incentive spirometer when instructed by your caregiver.  RISKS AND COMPLICATIONS Take your time so you do not get dizzy or light-headed. If you are in pain, you may need to take or ask for pain medication before doing incentive spirometry. It is harder to take a deep breath if you are having pain. AFTER USE Rest and breathe slowly and easily. It can be helpful to keep track of a log of your progress. Your caregiver can provide you with a simple table to help with this. If you are using the spirometer at home, follow these instructions: [place] IF:  You are having difficultly using the spirometer. You have trouble using the spirometer as often as instructed. Your pain medication is not giving enough relief while using the spirometer. You develop fever of 100.5 F (38.1 C) or higher. SEEK IMMEDIATE MEDICAL CARE IF:  You cough up bloody sputum that had not been present before. You develop fever of 102 F (38.9 C) or greater. You develop worsening pain at or near the incision site. MAKE SURE YOU:  Understand these instructions. Will watch your condition. Will get help right away if you are not doing well or get worse. Document Released: 05/10/2006 Document Revised: 03/22/2011 Document Reviewed: 07/11/2006 Exit[employer] Patient Information 2014 [employer], [place].   ________________________________________________________________________  WHAT IS A BLOOD TRANSFUSION? Blood  Transfusion Information  A transfusion is the replacement of blood or some of its parts. Blood is made up of multiple cells which provide different functions. Red blood cells carry oxygen and are used for blood loss replacement. White blood cells fight against infection. Platelets control bleeding. Plasma helps clot blood. Other blood products are available for specialized needs, such as hemophilia or other clotting disorders. BEFORE THE TRANSFUSION  Who gives blood for transfusions?  Healthy volunteers who are fully evaluated to make sure their blood is safe. This is blood bank blood. Transfusion therapy is the safest it has ever been in the practice of medicine. Before blood is taken from a donor, a complete history is taken to make sure that person has no history of diseases nor engages in risky social behavior (examples are intravenous drug use or sexual activity with multiple partners). The donor's travel history is screened to minimize risk of transmitting infections, such as malaria. The donated blood is tested for signs of infectious diseases, such as HIV and hepatitis. The blood is then tested to be sure it is compatible with you in order to minimize the chance of a transfusion reaction. If you or a relative donates blood, this is often done in anticipation of surgery and is not appropriate for emergency situations. It takes many days to process the donated blood. RISKS AND COMPLICATIONS Although transfusion therapy is very safe and saves many lives, the main dangers of transfusion include:  Getting an infectious disease. Developing a transfusion reaction. This is an allergic reaction to something in the blood you were given. Every precaution is taken to prevent this. The decision to have a blood transfusion has been considered carefully by your caregiver before blood is given. Blood is not given unless the benefits outweigh the risks. AFTER THE TRANSFUSION Right after receiving a blood  transfusion, you will usually feel much better and more energetic. This is especially true if your red blood cells have gotten low (anemic). The transfusion raises the level of the red blood cells which carry oxygen, and this usually causes an energy increase. The nurse administering the transfusion will monitor you carefully for complications. HOME CARE INSTRUCTIONS  No special instructions are needed after a transfusion. You may find your energy is better. Speak with your caregiver about any limitations on activity for underlying diseases you may have. SEEK MEDICAL CARE IF:  Your condition is not improving after your transfusion. You develop redness or irritation at the intravenous (IV) site. SEEK IMMEDIATE MEDICAL CARE IF:  Any of the following symptoms occur over the next 12 hours: Shaking chills. You have a temperature by mouth above 102 F (38.9 C), not controlled by medicine. Chest, back, or muscle pain. People around you feel you are not acting correctly or are confused. Shortness of breath or difficulty breathing. Dizziness and fainting. You get a rash or develop hives. You have a decrease in urine output. Your urine turns a dark color or changes to pink, red, or brown. Any of the following symptoms occur over the next 10 days: You have a temperature by mouth above 102 F (38.9 C), not controlled by medicine. Shortness of breath. Weakness after normal activity. The white part of the eye turns yellow (jaundice). You have a decrease in the amount of urine or are urinating less often. Your urine turns a dark color or changes to pink, red, or brown. Document Released: 12/26/1999 Document Revised: 03/22/2011 Document Reviewed: 08/14/2007 [facility] Patient Information 2014 [place], [place].  _______________________________________________________________________

## 2020-09-17 NOTE — Progress Notes (Addendum)
COVID swab appointment:  09-29-20  COVID Vaccine Completed:  Yes x3 Date COVID Vaccine completed: 02-24-19, 03-21-19 Has received booster:  Yes x1 10-09-19 COVID vaccine manufacturer: Pfizer      Date of COVID positive in last 90 days:  No  PCP - Bernerd Limbo, MD Cardiologist - Jenkins Rouge, MD  Medical clearance on chart dated 06-18-20  Chest x-ray - N/A EKG - 06-11-20 at Dr. Chales Abrahams office.  On chart Stress Test - N/A ECHO - N/A Cardiac Cath - N/A Pacemaker/ICD device last checked: Spinal Cord Stimulator:  Sleep Study - Yes, +sleep apnea CPAP - Yes  Fasting Blood Sugar - N/A Checks Blood Sugar _____ times a day  Blood Thinner Instructions:  Xarelto 20.  Hold for one day prior to surgery.  Patient aware  Aspirin Instructions: Last Dose:  Activity level:  Unable to go up a flight of stairs currently due to severe hip pain.  States prior to severe pain was able to do stairs without SOB and chest pain.    Able to perform activities of daily living without stopping and without symptoms of chest pain or shortness of breath.    Anesthesia review: N/A  Patient denies shortness of breath, fever, cough and chest pain at PAT appointment   Patient verbalized understanding of instructions that were given to them at the PAT appointment. Patient was also instructed that they will need to review over the PAT instructions again at home before surgery.

## 2020-09-18 ENCOUNTER — Encounter (HOSPITAL_COMMUNITY): Payer: Self-pay

## 2020-09-18 ENCOUNTER — Encounter (HOSPITAL_COMMUNITY)
Admission: RE | Admit: 2020-09-18 | Discharge: 2020-09-18 | Disposition: A | Discharge status: Home / Self Care | Payer: Medicare PPO | Source: Ambulatory Visit | Attending: Orthopedic Surgery | Admitting: Orthopedic Surgery

## 2020-09-18 ENCOUNTER — Other Ambulatory Visit: Payer: Self-pay

## 2020-09-18 DIAGNOSIS — Z79899 Other long term (current) drug therapy: Secondary | ICD-10-CM | POA: Diagnosis not present

## 2020-09-18 DIAGNOSIS — Z7901 Long term (current) use of anticoagulants: Secondary | ICD-10-CM | POA: Diagnosis not present

## 2020-09-18 DIAGNOSIS — Z01812 Encounter for preprocedural laboratory examination: Secondary | ICD-10-CM | POA: Diagnosis present

## 2020-09-18 LAB — PROTIME-INR
INR: 1.1 (ref 0.8–1.2)
Prothrombin Time: 13.9 seconds (ref 11.4–15.2)

## 2020-09-18 LAB — CBC
HCT: 40.4 % (ref 36.0–46.0)
Hemoglobin: 12.7 g/dL (ref 12.0–15.0)
MCH: 28.2 pg (ref 26.0–34.0)
MCHC: 31.4 g/dL (ref 30.0–36.0)
MCV: 89.6 fL (ref 80.0–100.0)
Platelets: 162 10*3/uL (ref 150–400)
RBC: 4.51 MIL/uL (ref 3.87–5.11)
RDW: 14.3 % (ref 11.5–15.5)
WBC: 3.6 10*3/uL — ABNORMAL LOW (ref 4.0–10.5)
nRBC: 0 % (ref 0.0–0.2)

## 2020-09-18 LAB — COMPREHENSIVE METABOLIC PANEL
ALT: 21 U/L (ref 0–44)
AST: 27 U/L (ref 15–41)
Albumin: 4.2 g/dL (ref 3.5–5.0)
Alkaline Phosphatase: 106 U/L (ref 38–126)
Anion gap: 7 (ref 5–15)
BUN: 21 mg/dL (ref 8–23)
CO2: 27 mmol/L (ref 22–32)
Calcium: 9.5 mg/dL (ref 8.9–10.3)
Chloride: 107 mmol/L (ref 98–111)
Creatinine, Ser: 0.67 mg/dL (ref 0.44–1.00)
GFR, Estimated: >60 mL/min (ref >60)
Glucose, Bld: 142 mg/dL — ABNORMAL HIGH (ref 70–99)
Potassium: 4.2 mmol/L (ref 3.5–5.1)
Sodium: 141 mmol/L (ref 135–145)
Total Bilirubin: 0.4 mg/dL (ref 0.3–1.2)
Total Protein: 6.9 g/dL (ref 6.5–8.1)

## 2020-09-18 LAB — TYPE AND SCREEN
ABO/RH(D): O POS
Antibody Screen: NEGATIVE

## 2020-09-18 LAB — SURGICAL PCR SCREEN
MRSA, PCR: NEGATIVE
Staphylococcus aureus: NEGATIVE

## 2020-09-29 ENCOUNTER — Other Ambulatory Visit: Payer: Self-pay | Admitting: Orthopedic Surgery

## 2020-09-30 LAB — SARS CORONAVIRUS 2 (TAT 6-24 HRS): SARS Coronavirus 2: NEGATIVE

## 2020-09-30 NOTE — H&P (Signed)
TOTAL HIP ADMISSION H&P  Patient is admitted for right total hip arthroplasty.  Subjective:  Chief Complaint: Right hip pain  HPI: [place], 70 y.o. female, has a history of pain and functional disability in the right hip due to arthritis and patient has failed non-surgical conservative treatments for greater than 12 weeks to include NSAID's and/or analgesics, flexibility and strengthening excercises, and activity modification. Onset of symptoms was gradual, starting  several  years ago with gradually worsening course since that time. The patient noted no past surgery on the right hip. Patient currently rates pain in the right hip at 7 out of 10 with activity. Patient has worsening of pain with activity and weight bearing, pain that interfers with activities of daily living, pain with passive range of motion, and crepitus. Patient has evidence of periarticular osteophytes and joint space narrowing by imaging studies. This condition presents safety issues increasing the risk of falls.There is no current active infection.  Patient Active Problem List   Diagnosis Date Noted   OA (osteoarthritis) of hip 06/25/2020   S/P total left hip arthroplasty 06/25/2020   Gait disturbance 12/20/2019   Spinal stenosis in cervical region 12/20/2019   Morbid obesity with alveolar hypoventilation ([place]) 07/28/2017   Severe obstructive sleep apnea 07/28/2017   Genetic testing 11/04/2016   Family history of breast cancer    DJD (degenerative joint disease), cervical 07/28/2016   DDD (degenerative disc disease), lumbar 07/28/2016   History of DVT (deep vein thrombosis) 07/28/2016   History of ovarian cancer 07/28/2016   Primary osteoarthritis of both knees 04/20/2016   Primary osteoarthritis of both hands 04/20/2016   Primary osteoarthritis of both feet 04/20/2016   Primary osteoarthritis of both hips 04/20/2016   Vitamin D deficiency 04/20/2016   Primary open angle glaucoma of both eyes, moderate  stage 12/30/2015   Hyperlipidemia 10/15/2015   Anemia 10/15/2015   Sleep apnea with use of continuous positive airway pressure (CPAP) 07/22/2014   OSA on CPAP 07/04/2013   Obesity, morbid ([place]) 07/04/2013   Morbid obesity ([place]) 11/24/2011   Arthritis of knee 11/24/2011   Class 3 severe obesity due to excess calories without serious comorbidity with body mass index (BMI) of 50.0 to 59.9 in adult ([place]) 12/04/2010   ELECTROCARDIOGRAM, ABNORMAL 06/02/2009   Acute thromboembolism of deep veins of lower extremity ([place]) 03/26/2008   EDEMA 03/26/2008   ADENOCARCINOMA, OVARY 03/25/2008   ESSENTIAL HYPERTENSION, BENIGN 03/25/2008    Past Medical History:  Diagnosis Date   Anemia    oral iron occ.   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   -"wears compression hose all times" left leg   FH: genetic disease carrier 11/05/2016   Genetic testing 11/04/2016   Multi-Cancer panel (83 genes) @ [facility] - see report; special interpretation   Glaucoma    [employer] 12/19/2019   History of ovarian cancer    HTN (  hypertension)    Hyperlipidemia    Hypertension    [facility] 12/19/2019   OSA (obstructive sleep apnea)    hypoventilation, on CPAP   Osteopenia 2019   Pneumonia    Shingles    Sleep apnea    [employer] 12/19/2019    Past Surgical History:  Procedure Laterality Date   ABDOMINAL HYSTERECTOMY     BASAL CELL CARCINOMA EXCISION     resection with plastic surgery reconstrustion   BREAST LUMPECTOMY Left 1970   benign   CATARACT EXTRACTION Bilateral 2002,  2005   with bilateral lens replacements   COLONOSCOPY WITH PROPOFOL N/A 05/21/2014   Procedure: COLONOSCOPY WITH PROPOFOL;  Surgeon: Juanita Craver, MD;  Location: [facility];  Service: Endoscopy;  Laterality: N/A;   EYE SURGERY     HERNIA REPAIR  1287   umbilical repair with mesh    TOTAL ABDOMINAL HYSTERECTOMY W/ BILATERAL SALPINGOOPHORECTOMY  2005   Stage IA ovarian cancer   TOTAL HIP ARTHROPLASTY Left 06/25/2020   Procedure: TOTAL HIP ARTHROPLASTY ANTERIOR APPROACH;  Surgeon: Gaynelle Arabian, MD;  Location: [facility];  Service: Orthopedics;  Laterality: Left;  124mn    Prior to Admission medications   Medication Sig Start Date End Date Taking? Authorizing Provider  acetaminophen (TYLENOL) 650 MG CR tablet Take 1,300 mg by mouth every 8 (eight) hours.   Yes [provider]  atorvastatin (LIPITOR) 40 MG tablet Take 1 tablet (40 mg total) by mouth daily. 03/31/20  Yes Just, KLaurita Quint FNP  Cholecalciferol (VITAMIN D) 50 MCG (2000 UT) tablet Take 2,000 Units by mouth daily. TAKES EVERY EVE   Yes [provider]  ferrous sulfate 325 (65 FE) MG tablet Take 325 mg by mouth daily with breakfast.   Yes [provider]  fish oil-omega-3 fatty acids 1000 MG capsule Take 1 g by mouth 2 (two) times daily.   Yes [provider]  fluticasone (FLONASE) 50 MCG/ACT nasal spray Place 1 spray into both nostrils 2 (two) times daily. Patient taking differently: Place 2 sprays into both nostrils daily. 03/31/20  Yes Just, KLaurita Quint FNP  furosemide (LASIX) 20 MG tablet Take 1 tablet (20 mg total) by mouth daily. 03/31/20  Yes Just, KLaurita Quint FNP  lisinopril (ZESTRIL) 40 MG tablet Take 1 tablet (40 mg total) by mouth daily. 03/31/20  Yes Just, KLaurita Quint FNP  Multiple Vitamin (MULTIVITAMIN) tablet Take 1 tablet by mouth every morning.   Yes [provider]  niacin 500 MG tablet Take 1 tablet (500 mg total) by mouth every evening. 03/31/20  Yes Just, KLaurita Quint FNP  nystatin cream  (MYCOSTATIN) Apply 1 application topically 2 (two) times daily. Patient taking differently: Apply 1 application topically 2 (two) times daily as needed for dry skin. 03/31/20  Yes Just, KLaurita Quint FNP  OVER THE COUNTER MEDICATION Apply 1 application topically daily. Fungi Care for toenails   Yes [provider]  rivaroxaban (XARELTO) 20 MG TABS tablet Take 1 tablet (20 mg total) by mouth daily. 03/31/20  Yes Just, KLaurita Quint FNP  HYDROcodone-acetaminophen (NORCO/VICODIN) 5-325 MG tablet Take 1-2 tablets by mouth every 6 (six) hours as needed for severe pain. Patient not taking: No sig reported 06/26/20   CFenton FoyD, PA-C  methocarbamol (ROBAXIN) 500 MG tablet Take 1 tablet (500 mg total) by mouth every 6 (six) hours as needed for muscle spasms. Patient not taking: No sig reported 06/26/20   CFenton FoyD, PA-C  ondansetron (ZOFRAN) 4 MG tablet Take  1 tablet (4 mg total) by mouth every 6 (six) hours as needed for nausea. Patient not taking: No sig reported 06/26/20   Fenton Foy D, PA-C  traMADol (ULTRAM) 50 MG tablet Take 1-2 tablets (50-100 mg total) by mouth every 6 (six) hours as needed for moderate pain. Patient not taking: No sig reported 06/26/20   Fenton Foy D, PA-C    Allergies  Allergen Reactions   Valtrex [Valacyclovir Hcl] Swelling and Rash    Swelling of face and arm, rash   Benoxinate Base     Turns eyes blood red/burning. --in eyedrops--   Iodinated Diagnostic Agents Rash    Rash on back Other reaction(s): Unknown   Sulfa Antibiotics Rash    Social History   Socioeconomic History   Marital status: Single    Spouse name: Not on file   Number of children: 0   Years of education: masters   Highest education level: Master's degree (e.g., MA, MS, MEng, MEd, MSW, MBA)  Occupational History   Occupation: Clinical research associate: [employer]    Comment: fifth grade (retired)  Tobacco Use   Smoking status: Never   Smokeless tobacco: Never    Tobacco comments:    only for 6 months in the 70s  Vaping Use   Vaping Use: Never used  Substance and Sexual Activity   Alcohol use: Not Currently    Alcohol/week: 0.0 standard drinks   Drug use: Never   Sexual activity: Not Currently    Birth control/protection: Surgical    Comment: pt strait   Other Topics Concern   Not on file  Social History Narrative   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 PSH showed an AHI of 120!!!! titrated to 8 cm watr cPAP , residual AHI of 1 and downlaod was done  01-23-11 .  excellent compliance - 7 hours and low leak,  nasal pillow mask.    BMI is considered morbidly obese.  discussed low carb  diet - weight watchers and restricted exercise. , aquatic .    Patient cannot exercise due to soreness in proximal muscles.and  would like to add coenzyme q 10 and carnitine . She needs a medical weight loss regimen - bariatric surgery?       FSS 48, Ep[place] 4 again ,  CMS compliant  residual AHI 1.1 and average use 6.48  hours , no naps    Patient is single and lives alone.   Patient does not have any children.   Patient is retired.   Patient has a Master's degree.   Patient is left-handed.   Patient drinks tea occasionally.             Social Determinants of Health   Financial Resource Strain: Not on file  Food Insecurity: Not on file  Transportation Needs: Not on file  Physical Activity: Not on file  Stress: Not on file  Social Connections: Not on file  Intimate Partner Violence: Not on file    Tobacco Use: Low Risk    Smoking Tobacco Use: Never   Smokeless Tobacco Use: Never   Social History   Substance and Sexual Activity  Alcohol Use Not Currently   Alcohol/week: 0.0 standard drinks    Family History  Problem Relation Age of Onset   Alzheimer's disease Mother    Hypertension Mother    Alzheimer's disease Father    Hypertension Father    Colon cancer Father 77  deceased 29   Hypertension Sister    Breast  cancer Sister 66   Parkinson's disease Sister    Hypertension Brother    Prostate cancer Brother 80   Prostate cancer Other        father's maternal half-brother   Breast cancer Other 70       daughter of brother; currently 23    ROS: Constitutional: no fever, no chills, no night sweats, no significant weight loss Cardiovascular: no chest pain, no palpitations Respiratory: no cough, no shortness of breath, No COPD Gastrointestinal: no vomiting, no nausea Musculoskeletal: no swelling in Joints, Joint Pain Neurologic: no numbness, no tingling, no difficulty with balance    Objective:  Physical Exam: Well nourished and well developed.  General: Alert and oriented x3, cooperative and pleasant, no acute distress.  Head: normocephalic, atraumatic, neck supple.  Eyes: EOMI.  Respiratory: breath sounds clear in all fields, no wheezing, rales, or rhonchi. Cardiovascular: Regular rate and rhythm, no murmurs, gallops or rubs.  Abdomen: non-tender to palpation and soft, normoactive bowel sounds. Musculoskeletal:     Right hip exam:   Range of motion: Flexion is 90 degrees, internal rotation is 0 degrees, external rotation is 0 degrees, and abduction is 10 degrees without discomfort.       Left hip exam:   There is no swelling.   The patient has well-healed incisions without tenderness.   Range of motion: Flexion is 110 degrees, internal rotation is 20 degrees, external rotation is 30 degrees, and abduction is 30 degrees without discomfort.   Calves soft and nontender. Motor function intact in LE. Strength 5/5 LE bilaterally. Neuro: Distal pulses 2+. Sensation to light touch intact in LE.   Vital signs in last 24 hours:    Imaging Review Radiographs - AP pelvis, AP and lateral of the left hip from today demonstrate the prosthesis on the left is in excellent position with no periprosthetic abnormalities. She has severe bone-on-bone arthritis on the right with near  ankylosis.  Assessment/Plan:  End stage arthritis, right hip  The patient history, physical examination, clinical judgement of the provider and imaging studies are consistent with end stage degenerative joint disease of the right hip and total hip arthroplasty is deemed medically necessary. The treatment options including medical management, injection therapy, arthroscopy and arthroplasty were discussed at length. The risks and benefits of total hip arthroplasty were presented and reviewed. The risks due to aseptic loosening, infection, stiffness, dislocation/subluxation, thromboembolic complications and other imponderables were discussed. The patient acknowledged the explanation, agreed to proceed with the plan and consent was signed. Patient is being admitted for inpatient treatment for surgery, pain control, PT, OT, prophylactic antibiotics, VTE prophylaxis, progressive ambulation and ADLs and discharge planning.The patient is planning to be discharged  home .   Patient's anticipated LOS is less than 2 midnights, meeting these requirements: - Lives within 1 hour of care - Has a competent adult at home to recover with post-op  - NO history of  - Chronic pain requiring opioids  - Diabetes  - Coronary Artery Disease  - Heart failure  - Heart attack  - Stroke  - DVT/VTE  - Cardiac arrhythmia  - Respiratory Failure/COPD  - Renal failure  - Anemia  - Advanced Liver disease   Therapy Plans: HEP initially then Home Health Disposition: Home with sister-in-law Molly.  Planned DVT Prophylaxis: Xarelto DME Needed: None PCP: Bernerd Limbo, MD TXA: IV Allergies: Sulfa (Rash), Valtrex (Rash), Benoxinate, CT Contrast meds Anesthesia Concerns: None  BMI: 43.4 Last HgbA1c: N/A  [facility] [address]   - Patient was instructed on what medications to stop prior to surgery. - Follow-up visit in 2 weeks with Dr. Wynelle Link - Begin physical therapy following surgery - Pre-operative  lab work as pre-surgical testing - Prescriptions will be provided in hospital at time of discharge  Fenton Foy, [facility], PA-C Orthopedic Surgery [facility] [place]

## 2020-10-01 ENCOUNTER — Observation Stay (HOSPITAL_COMMUNITY): Payer: Medicare PPO

## 2020-10-01 ENCOUNTER — Ambulatory Visit (HOSPITAL_COMMUNITY): Payer: Medicare PPO

## 2020-10-01 ENCOUNTER — Observation Stay (HOSPITAL_COMMUNITY)
Admission: RE | Admit: 2020-10-01 | Discharge: 2020-10-02 | Disposition: A | Discharge status: Home / Self Care | Payer: Medicare PPO | Source: Ambulatory Visit | Attending: Orthopedic Surgery | Admitting: Orthopedic Surgery

## 2020-10-01 ENCOUNTER — Encounter (HOSPITAL_COMMUNITY): Payer: Self-pay | Admitting: Orthopedic Surgery

## 2020-10-01 ENCOUNTER — Ambulatory Visit (HOSPITAL_COMMUNITY): Payer: Medicare PPO | Admitting: Certified Registered Nurse Anesthetist

## 2020-10-01 ENCOUNTER — Encounter (HOSPITAL_COMMUNITY)
Admission: RE | Disposition: A | Discharge status: Home / Self Care | Payer: Self-pay | Source: Ambulatory Visit | Attending: Orthopedic Surgery

## 2020-10-01 ENCOUNTER — Other Ambulatory Visit: Payer: Self-pay

## 2020-10-01 DIAGNOSIS — Z96641 Presence of right artificial hip joint: Secondary | ICD-10-CM | POA: Diagnosis present

## 2020-10-01 DIAGNOSIS — Z85828 Personal history of other malignant neoplasm of skin: Secondary | ICD-10-CM | POA: Insufficient documentation

## 2020-10-01 DIAGNOSIS — Z96649 Presence of unspecified artificial hip joint: Secondary | ICD-10-CM

## 2020-10-01 DIAGNOSIS — Z8543 Personal history of malignant neoplasm of ovary: Secondary | ICD-10-CM | POA: Insufficient documentation

## 2020-10-01 DIAGNOSIS — M1611 Unilateral primary osteoarthritis, right hip: Secondary | ICD-10-CM | POA: Diagnosis not present

## 2020-10-01 DIAGNOSIS — I10 Essential (primary) hypertension: Secondary | ICD-10-CM | POA: Insufficient documentation

## 2020-10-01 DIAGNOSIS — Z79899 Other long term (current) drug therapy: Secondary | ICD-10-CM | POA: Insufficient documentation

## 2020-10-01 DIAGNOSIS — Z96642 Presence of left artificial hip joint: Secondary | ICD-10-CM | POA: Insufficient documentation

## 2020-10-01 DIAGNOSIS — S72041A Displaced fracture of base of neck of right femur, initial encounter for closed fracture: Secondary | ICD-10-CM

## 2020-10-01 HISTORY — PX: TOTAL HIP ARTHROPLASTY: SHX124

## 2020-10-01 SURGERY — ARTHROPLASTY, HIP, TOTAL, ANTERIOR APPROACH
Anesthesia: General | Site: Hip | Laterality: Right

## 2020-10-01 MED ORDER — MORPHINE SULFATE (PF) 2 MG/ML IV SOLN: 0.5000 mg | INTRAVENOUS | Status: DC | PRN

## 2020-10-01 MED ORDER — PHENYLEPHRINE 40 MCG/ML (10ML) SYRINGE FOR IV PUSH (FOR BLOOD PRESSURE SUPPORT)
PREFILLED_SYRINGE | INTRAVENOUS | Status: AC
  Filled 2020-10-01: qty 10

## 2020-10-01 MED ORDER — DEXAMETHASONE SODIUM PHOSPHATE 10 MG/ML IJ SOLN
INTRAMUSCULAR | Status: AC
  Filled 2020-10-01: qty 1

## 2020-10-01 MED ORDER — METOCLOPRAMIDE HCL 5 MG/ML IJ SOLN
5.0000 mg | Freq: Three times a day (TID) | INTRAMUSCULAR | Status: DC | PRN
  Administered 2020-10-01: 10 mg via INTRAVENOUS
  Filled 2020-10-01: qty 2

## 2020-10-01 MED ORDER — HYDROMORPHONE HCL 1 MG/ML IJ SOLN
INTRAMUSCULAR | Status: AC
  Filled 2020-10-01: qty 1

## 2020-10-01 MED ORDER — ORAL CARE MOUTH RINSE: 15.0000 mL | Freq: Once | OROMUCOSAL | Status: AC

## 2020-10-01 MED ORDER — ONDANSETRON HCL 4 MG/2ML IJ SOLN
INTRAMUSCULAR | Status: DC | PRN
Start: 2020-10-01 — End: 2020-10-01
  Administered 2020-10-01: 4 mg via INTRAVENOUS

## 2020-10-01 MED ORDER — PHENYLEPHRINE 40 MCG/ML (10ML) SYRINGE FOR IV PUSH (FOR BLOOD PRESSURE SUPPORT)
PREFILLED_SYRINGE | INTRAVENOUS | Status: DC | PRN
  Administered 2020-10-01: 160 ug via INTRAVENOUS
  Administered 2020-10-01: 120 ug via INTRAVENOUS

## 2020-10-01 MED ORDER — OXYCODONE HCL 5 MG PO TABS: 5.0000 mg | ORAL_TABLET | Freq: Once | ORAL | Status: DC | PRN

## 2020-10-01 MED ORDER — METOCLOPRAMIDE HCL 5 MG PO TABS: 5.0000 mg | ORAL_TABLET | Freq: Three times a day (TID) | ORAL | Status: DC | PRN

## 2020-10-01 MED ORDER — ROCURONIUM BROMIDE 10 MG/ML (PF) SYRINGE
PREFILLED_SYRINGE | INTRAVENOUS | Status: DC | PRN
  Administered 2020-10-01: 70 mg via INTRAVENOUS

## 2020-10-01 MED ORDER — BUPIVACAINE-EPINEPHRINE (PF) 0.25% -1:200000 IJ SOLN
INTRAMUSCULAR | Status: DC | PRN
  Administered 2020-10-01: 30 mL

## 2020-10-01 MED ORDER — SODIUM CHLORIDE 0.9 % IV SOLN: INTRAVENOUS | Status: DC

## 2020-10-01 MED ORDER — 0.9 % SODIUM CHLORIDE (POUR BTL) OPTIME
TOPICAL | Status: DC | PRN
  Administered 2020-10-01: 1000 mL

## 2020-10-01 MED ORDER — BUPIVACAINE-EPINEPHRINE (PF) 0.25% -1:200000 IJ SOLN
INTRAMUSCULAR | Status: AC
  Filled 2020-10-01: qty 30

## 2020-10-01 MED ORDER — DEXAMETHASONE SODIUM PHOSPHATE 10 MG/ML IJ SOLN
INTRAMUSCULAR | Status: DC | PRN
  Administered 2020-10-01: 10 mg via INTRAVENOUS

## 2020-10-01 MED ORDER — TRANEXAMIC ACID 1000 MG/10ML IV SOLN
2000.0000 mg | Freq: Once | INTRAVENOUS | Status: DC
  Filled 2020-10-01: qty 20

## 2020-10-01 MED ORDER — METHOCARBAMOL 500 MG PO TABS
500.0000 mg | ORAL_TABLET | Freq: Four times a day (QID) | ORAL | Status: DC | PRN
  Administered 2020-10-01 – 2020-10-02 (×2): 500 mg via ORAL
  Filled 2020-10-01 (×2): qty 1

## 2020-10-01 MED ORDER — SUGAMMADEX SODIUM 200 MG/2ML IV SOLN
INTRAVENOUS | Status: DC | PRN
  Administered 2020-10-01: 300 mg via INTRAVENOUS

## 2020-10-01 MED ORDER — MENTHOL 3 MG MT LOZG: 1.0000 | LOZENGE | OROMUCOSAL | Status: DC | PRN

## 2020-10-01 MED ORDER — DEXAMETHASONE SODIUM PHOSPHATE 10 MG/ML IJ SOLN
10.0000 mg | Freq: Once | INTRAMUSCULAR | Status: AC
  Administered 2020-10-02: 10 mg via INTRAVENOUS
  Filled 2020-10-01: qty 1

## 2020-10-01 MED ORDER — ACETAMINOPHEN 325 MG PO TABS
325.0000 mg | ORAL_TABLET | Freq: Four times a day (QID) | ORAL | Status: DC | PRN
  Administered 2020-10-02: 650 mg via ORAL
  Filled 2020-10-01: qty 2

## 2020-10-01 MED ORDER — ONDANSETRON HCL 4 MG/2ML IJ SOLN: 4.0000 mg | Freq: Once | INTRAMUSCULAR | Status: DC | PRN

## 2020-10-01 MED ORDER — PROPOFOL 10 MG/ML IV BOLUS
INTRAVENOUS | Status: DC | PRN
  Administered 2020-10-01: 170 mg via INTRAVENOUS

## 2020-10-01 MED ORDER — RIVAROXABAN 10 MG PO TABS
10.0000 mg | ORAL_TABLET | Freq: Every day | ORAL | Status: DC
  Administered 2020-10-02: 10 mg via ORAL
  Filled 2020-10-01: qty 1

## 2020-10-01 MED ORDER — HYDROCODONE-ACETAMINOPHEN 5-325 MG PO TABS
1.0000 | ORAL_TABLET | Freq: Four times a day (QID) | ORAL | Status: DC | PRN
Start: 2020-10-01 — End: 2020-10-02
  Administered 2020-10-01 – 2020-10-02 (×2): 1 via ORAL
  Filled 2020-10-01 (×3): qty 1

## 2020-10-01 MED ORDER — BISACODYL 10 MG RE SUPP: 10.0000 mg | Freq: Every day | RECTAL | Status: DC | PRN

## 2020-10-01 MED ORDER — POLYETHYLENE GLYCOL 3350 17 G PO PACK: 17.0000 g | PACK | Freq: Every day | ORAL | Status: DC | PRN

## 2020-10-01 MED ORDER — ACETAMINOPHEN 10 MG/ML IV SOLN: 1000.0000 mg | Freq: Four times a day (QID) | INTRAVENOUS | Status: DC

## 2020-10-01 MED ORDER — TRAMADOL HCL 50 MG PO TABS
50.0000 mg | ORAL_TABLET | Freq: Four times a day (QID) | ORAL | Status: DC | PRN
  Administered 2020-10-02: 100 mg via ORAL
  Filled 2020-10-01: qty 2

## 2020-10-01 MED ORDER — PHENOL 1.4 % MT LIQD: 1.0000 | OROMUCOSAL | Status: DC | PRN

## 2020-10-01 MED ORDER — ROCURONIUM BROMIDE 10 MG/ML (PF) SYRINGE
PREFILLED_SYRINGE | INTRAVENOUS | Status: AC
  Filled 2020-10-01: qty 10

## 2020-10-01 MED ORDER — FENTANYL CITRATE (PF) 100 MCG/2ML IJ SOLN
INTRAMUSCULAR | Status: AC
  Filled 2020-10-01: qty 2

## 2020-10-01 MED ORDER — FUROSEMIDE 20 MG PO TABS
20.0000 mg | ORAL_TABLET | Freq: Every day | ORAL | Status: DC
  Administered 2020-10-02: 20 mg via ORAL
  Filled 2020-10-01: qty 1

## 2020-10-01 MED ORDER — ACETAMINOPHEN 10 MG/ML IV SOLN
1000.0000 mg | Freq: Once | INTRAVENOUS | Status: AC
  Administered 2020-10-01: 1000 mg via INTRAVENOUS
  Filled 2020-10-01: qty 100

## 2020-10-01 MED ORDER — LIDOCAINE HCL (PF) 2 % IJ SOLN
INTRAMUSCULAR | Status: AC
  Filled 2020-10-01: qty 5

## 2020-10-01 MED ORDER — OXYCODONE HCL 5 MG/5ML PO SOLN
5.0000 mg | Freq: Once | ORAL | Status: DC | PRN
Start: 2020-10-01 — End: 2020-10-01

## 2020-10-01 MED ORDER — LACTATED RINGERS IV SOLN: INTRAVENOUS | Status: DC

## 2020-10-01 MED ORDER — ATORVASTATIN CALCIUM 40 MG PO TABS
40.0000 mg | ORAL_TABLET | Freq: Every day | ORAL | Status: DC
  Administered 2020-10-02: 40 mg via ORAL
  Filled 2020-10-01: qty 1

## 2020-10-01 MED ORDER — HYDROMORPHONE HCL 1 MG/ML IJ SOLN
INTRAMUSCULAR | Status: DC | PRN
  Administered 2020-10-01 (×2): .2 mg via INTRAVENOUS
  Administered 2020-10-01: .6 mg via INTRAVENOUS
  Administered 2020-10-01: .4 mg via INTRAVENOUS
  Administered 2020-10-01: .6 mg via INTRAVENOUS

## 2020-10-01 MED ORDER — TRANEXAMIC ACID-NACL 1000-0.7 MG/100ML-% IV SOLN
1000.0000 mg | INTRAVENOUS | Status: DC
  Filled 2020-10-01: qty 100

## 2020-10-01 MED ORDER — HYDROMORPHONE HCL 2 MG/ML IJ SOLN
INTRAMUSCULAR | Status: AC
  Filled 2020-10-01: qty 1

## 2020-10-01 MED ORDER — ONDANSETRON HCL 4 MG/2ML IJ SOLN
4.0000 mg | Freq: Four times a day (QID) | INTRAMUSCULAR | Status: DC | PRN
  Administered 2020-10-01: 4 mg via INTRAVENOUS
  Filled 2020-10-01: qty 2

## 2020-10-01 MED ORDER — WATER FOR IRRIGATION, STERILE IR SOLN
Status: DC | PRN
  Administered 2020-10-01: 2000 mL

## 2020-10-01 MED ORDER — FLUTICASONE PROPIONATE 50 MCG/ACT NA SUSP
1.0000 | Freq: Two times a day (BID) | NASAL | Status: DC
  Administered 2020-10-01 – 2020-10-02 (×2): 1 via NASAL
  Filled 2020-10-01: qty 16

## 2020-10-01 MED ORDER — TRANEXAMIC ACID 1000 MG/10ML IV SOLN
INTRAVENOUS | Status: DC | PRN
  Administered 2020-10-01: 2000 mg via TOPICAL

## 2020-10-01 MED ORDER — LIDOCAINE 2% (20 MG/ML) 5 ML SYRINGE
INTRAMUSCULAR | Status: DC | PRN
  Administered 2020-10-01: 60 mg via INTRAVENOUS

## 2020-10-01 MED ORDER — CHLORHEXIDINE GLUCONATE 0.12 % MT SOLN
15.0000 mL | Freq: Once | OROMUCOSAL | Status: AC
  Administered 2020-10-01: 15 mL via OROMUCOSAL

## 2020-10-01 MED ORDER — HYDROMORPHONE HCL 1 MG/ML IJ SOLN
0.2500 mg | INTRAMUSCULAR | Status: DC | PRN
  Administered 2020-10-01 (×4): 0.5 mg via INTRAVENOUS

## 2020-10-01 MED ORDER — PHENYLEPHRINE HCL-NACL 20-0.9 MG/250ML-% IV SOLN
INTRAVENOUS | Status: DC | PRN
  Administered 2020-10-01: 50 ug/min via INTRAVENOUS

## 2020-10-01 MED ORDER — ONDANSETRON HCL 4 MG/2ML IJ SOLN
INTRAMUSCULAR | Status: AC
  Filled 2020-10-01: qty 2

## 2020-10-01 MED ORDER — ONDANSETRON HCL 4 MG PO TABS: 4.0000 mg | ORAL_TABLET | Freq: Four times a day (QID) | ORAL | Status: DC | PRN

## 2020-10-01 MED ORDER — DOCUSATE SODIUM 100 MG PO CAPS
100.0000 mg | ORAL_CAPSULE | Freq: Two times a day (BID) | ORAL | Status: DC
  Administered 2020-10-01 – 2020-10-02 (×2): 100 mg via ORAL
  Filled 2020-10-01 (×2): qty 1

## 2020-10-01 MED ORDER — DEXAMETHASONE SODIUM PHOSPHATE 10 MG/ML IJ SOLN: 8.0000 mg | Freq: Once | INTRAMUSCULAR | Status: DC

## 2020-10-01 MED ORDER — CEFAZOLIN SODIUM-DEXTROSE 2-4 GM/100ML-% IV SOLN
2.0000 g | Freq: Four times a day (QID) | INTRAVENOUS | Status: AC
Start: 2020-10-01 — End: 2020-10-01
  Administered 2020-10-01 (×2): 2 g via INTRAVENOUS
  Filled 2020-10-01 (×2): qty 100

## 2020-10-01 MED ORDER — HYDROMORPHONE HCL 1 MG/ML IJ SOLN
0.5000 mg | Freq: Once | INTRAMUSCULAR | Status: AC
  Administered 2020-10-01: 0.5 mg via INTRAVENOUS

## 2020-10-01 MED ORDER — PROPOFOL 1000 MG/100ML IV EMUL
INTRAVENOUS | Status: AC
  Filled 2020-10-01: qty 100

## 2020-10-01 MED ORDER — POVIDONE-IODINE 10 % EX SWAB
2.0000 "application " | Freq: Once | CUTANEOUS | Status: AC
  Administered 2020-10-01: 2 via TOPICAL

## 2020-10-01 MED ORDER — CEFAZOLIN IN SODIUM CHLORIDE 3-0.9 GM/100ML-% IV SOLN
3.0000 g | INTRAVENOUS | Status: AC
  Administered 2020-10-01: 3 g via INTRAVENOUS
  Filled 2020-10-01: qty 100

## 2020-10-01 MED ORDER — METHOCARBAMOL 500 MG IVPB - SIMPLE MED
500.0000 mg | Freq: Four times a day (QID) | INTRAVENOUS | Status: DC | PRN
  Filled 2020-10-01: qty 50

## 2020-10-01 MED ORDER — FENTANYL CITRATE (PF) 100 MCG/2ML IJ SOLN
INTRAMUSCULAR | Status: DC | PRN
  Administered 2020-10-01: 100 ug via INTRAVENOUS

## 2020-10-01 SURGICAL SUPPLY — 46 items
ADH SKN CLS APL DERMABOND .7 (GAUZE/BANDAGES/DRESSINGS) ×1
BAG COUNTER SPONGE SURGICOUNT (BAG) IMPLANT
BAG DECANTER FOR FLEXI CONT (MISCELLANEOUS) IMPLANT
BAG SPEC THK2 15X12 ZIP CLS (MISCELLANEOUS)
BAG SPNG CNTER NS LX DISP (BAG)
BAG ZIPLOCK 12X15 (MISCELLANEOUS) IMPLANT
BLADE SAG 18X100X1.27 (BLADE) ×2 IMPLANT
COVER PERINEAL POST (MISCELLANEOUS) ×2 IMPLANT
COVER SURGICAL LIGHT HANDLE (MISCELLANEOUS) ×2 IMPLANT
CUP ACETBLR 54 OD PINNACLE (Hips) ×1 IMPLANT
DECANTER SPIKE VIAL GLASS SM (MISCELLANEOUS) ×2 IMPLANT
DERMABOND ADVANCED (GAUZE/BANDAGES/DRESSINGS) ×1
DERMABOND ADVANCED .7 DNX12 (GAUZE/BANDAGES/DRESSINGS) ×1 IMPLANT
DRAPE FOOT SWITCH (DRAPES) ×2 IMPLANT
DRAPE STERI IOBAN 125X83 (DRAPES) ×2 IMPLANT
DRAPE U-SHAPE 47X51 STRL (DRAPES) ×4 IMPLANT
DRSG AQUACEL AG ADV 3.5X10 (GAUZE/BANDAGES/DRESSINGS) ×2 IMPLANT
DURAPREP 26ML APPLICATOR (WOUND CARE) ×2 IMPLANT
ELECT REM PT RETURN 15FT ADLT (MISCELLANEOUS) ×2 IMPLANT
GLOVE SRG 8 PF TXTR STRL LF DI (GLOVE) ×1 IMPLANT
GLOVE SURG ENC MOIS LTX SZ6.5 (GLOVE) ×2 IMPLANT
GLOVE SURG ENC MOIS LTX SZ7 (GLOVE) ×2 IMPLANT
GLOVE SURG ENC MOIS LTX SZ8 (GLOVE) ×4 IMPLANT
GLOVE SURG UNDER POLY LF SZ7 (GLOVE) ×2 IMPLANT
GLOVE SURG UNDER POLY LF SZ8 (GLOVE) ×2
GLOVE SURG UNDER POLY LF SZ8.5 (GLOVE) IMPLANT
GOWN STRL REUS W/TWL LRG LVL3 (GOWN DISPOSABLE) ×4 IMPLANT
GOWN STRL REUS W/TWL XL LVL3 (GOWN DISPOSABLE) IMPLANT
HEAD CERAMIC 36 PLUS 8.5 12 14 (Hips) ×1 IMPLANT
HOLDER FOLEY CATH W/STRAP (MISCELLANEOUS) ×2 IMPLANT
KIT TURNOVER KIT A (KITS) ×2 IMPLANT
LINER MARATHON NEUT +4X54X36 (Hips) ×1 IMPLANT
MANIFOLD NEPTUNE II (INSTRUMENTS) ×2 IMPLANT
PACK ANTERIOR HIP CUSTOM (KITS) ×2 IMPLANT
PENCIL SMOKE EVACUATOR COATED (MISCELLANEOUS) ×2 IMPLANT
STEM FEM ACTIS HIGH SZ8 (Stem) ×1 IMPLANT
STRIP CLOSURE SKIN 1/2X4 (GAUZE/BANDAGES/DRESSINGS) ×2 IMPLANT
SUT ETHIBOND NAB CT1 #1 30IN (SUTURE) ×2 IMPLANT
SUT MNCRL AB 4-0 PS2 18 (SUTURE) ×2 IMPLANT
SUT STRATAFIX 0 PDS 27 VIOLET (SUTURE) ×2
SUT VIC AB 2-0 CT1 27 (SUTURE) ×4
SUT VIC AB 2-0 CT1 TAPERPNT 27 (SUTURE) ×2 IMPLANT
SUTURE STRATFX 0 PDS 27 VIOLET (SUTURE) ×1 IMPLANT
SYR 50ML LL SCALE MARK (SYRINGE) IMPLANT
TRAY FOLEY MTR SLVR 16FR STAT (SET/KITS/TRAYS/PACK) ×2 IMPLANT
TUBE SUCTION HIGH CAP CLEAR NV (SUCTIONS) ×2 IMPLANT

## 2020-10-01 NOTE — Anesthesia Procedure Notes (Signed)
Procedure Name: Intubation Date/Time: 10/01/2020 12:43 PM Performed by: Rosaland Lao, CRNA Pre-anesthesia Checklist: Patient identified, Emergency Drugs available, Suction available and Patient being monitored Patient Re-evaluated:Patient Re-evaluated prior to induction Oxygen Delivery Method: Circle system utilized Preoxygenation: Pre-oxygenation with 100% oxygen Induction Type: IV induction Ventilation: Mask ventilation without difficulty Laryngoscope Size: Miller and 2 Grade View: Grade I Tube type: Oral Tube size: 7.0 mm Number of attempts: 1 Airway Equipment and Method: Stylet Placement Confirmation: ETT inserted through vocal cords under direct vision, positive ETCO2 and breath sounds checked- equal and bilateral Secured at: 22 cm Tube secured with: Tape Dental Injury: Teeth and Oropharynx as per pre-operative assessment

## 2020-10-01 NOTE — Progress Notes (Signed)
PT Cancellation Note  Patient Details Name: [name] MRN: [id number] DOB: [birth year]   Cancelled Treatment:     Checked on pt this evening for her first session of PT , however pt not feeling well , " I am still out of it, don't feel well at all". Pt deferred PT and wants to start tomorrow. I did let the pt know that is is important to dangle with nursing later this evening if she is feeling up to that.    Clide Dales 10/01/2020, 6:02 PM Courtenay Creger, PT, MPT Acute Rehabilitation Services Office: [phone] Pager: [phone] 10/01/2020

## 2020-10-01 NOTE — Anesthesia Postprocedure Evaluation (Signed)
Anesthesia Post Note  Patient: [name]  Procedure(s) Performed: TOTAL HIP ARTHROPLASTY ANTERIOR APPROACH (Right: Hip)     Patient location during evaluation: PACU Anesthesia Type: General Level of consciousness: awake and alert and oriented Pain management: pain level controlled Vital Signs Assessment: post-procedure vital signs reviewed and stable Respiratory status: spontaneous breathing, nonlabored ventilation and respiratory function stable Cardiovascular status: blood pressure returned to baseline and stable Postop Assessment: no apparent nausea or vomiting Anesthetic complications: no   No notable events documented.  Last Vitals:  Vitals:   10/01/20 1530 10/01/20 1545  BP: (!) 107/55 (!) 130/56  Pulse: (!) 57 60  Resp: 10 15  Temp: 36.5 C 36.6 C  SpO2: 97% 94%    Last Pain:  Vitals:   10/01/20 1545  TempSrc: Oral  PainSc:                  Nikolus Marczak A.

## 2020-10-01 NOTE — Interval H&P Note (Signed)
History and Physical Interval Note:  10/01/2020 11:30 AM  [place]  has presented today for surgery, with the diagnosis of Right hip osteoarthritis.  The various methods of treatment have been discussed with the patient and family. After consideration of risks, benefits and other options for treatment, the patient has consented to  Procedure(s): TOTAL HIP ARTHROPLASTY ANTERIOR APPROACH (Right) as a surgical intervention.  The patient's history has been reviewed, patient examined, no change in status, stable for surgery.  I have reviewed the patient's chart and labs.  Questions were answered to the patient's satisfaction.     Pilar Plate Leeba Barbe

## 2020-10-01 NOTE — Anesthesia Preprocedure Evaluation (Addendum)
Anesthesia Evaluation  Patient identified by MRN, date of birth, ID band Patient awake    Reviewed: Allergy & Precautions, NPO status , Patient's Chart, lab work & pertinent test results, reviewed documented beta blocker date and time   Airway Mallampati: II  TM Distance: >3 FB Neck ROM: Full    Dental no notable dental hx. (+) Teeth Intact, Dental Advisory Given, Caps   Pulmonary sleep apnea and Continuous Positive Airway Pressure Ventilation , pneumonia, resolved,    Pulmonary exam normal breath sounds clear to auscultation       Cardiovascular hypertension, Pt. on medications + DVT  Normal cardiovascular exam Rhythm:Regular Rate:Normal  Hx/o femoral DVT 2008   Neuro/Psych Glaucoma ou  Neuromuscular disease negative psych ROS   GI/Hepatic negative GI ROS, Neg liver ROS,   Endo/Other  Morbid obesityHyperlipidemia  Renal/GU negative Renal ROS  negative genitourinary   Musculoskeletal  (+) Arthritis , Osteoarthritis,    Abdominal (+) + obese,   Peds  Hematology  (+) anemia , Xarelto therapy-last dose 48 hours ago   Anesthesia Other Findings   Reproductive/Obstetrics                            Anesthesia Physical Anesthesia Plan  ASA: 3  Anesthesia Plan: General   Post-op Pain Management:    Induction: Intravenous  PONV Risk Score and Plan: 3 and Treatment may vary due to age or medical condition, Propofol infusion and Ondansetron  Airway Management Planned: Oral ETT  Additional Equipment:   Intra-op Plan:   Post-operative Plan: Extubation in OR  Informed Consent: I have reviewed the patients History and Physical, chart, labs and discussed the procedure including the risks, benefits and alternatives for the proposed anesthesia with the patient or authorized representative who has indicated his/her understanding and acceptance.     Dental advisory given  Plan Discussed with:  CRNA and Anesthesiologist  Anesthesia Plan Comments: (Patient took xarelto 20mg  at noon on 9/19. Will proceed with GETA.)       Anesthesia Quick Evaluation

## 2020-10-01 NOTE — Transfer of Care (Signed)
Immediate Anesthesia Transfer of Care Note  Patient: [name]  Procedure(s) Performed: TOTAL HIP ARTHROPLASTY ANTERIOR APPROACH (Right: Hip)  Patient Location: PACU  Anesthesia Type:General  Level of Consciousness: awake, alert  and oriented  Airway & Oxygen Therapy: Patient Spontanous Breathing and Patient connected to face mask  Post-op Assessment: Report given to RN  Post vital signs: Reviewed and stable  Last Vitals:  Vitals Value Taken Time  BP 138/68 10/01/20 1424  Temp    Pulse 64 10/01/20 1429  Resp 12 10/01/20 1429  SpO2 99 % 10/01/20 1429  Vitals shown include unvalidated device data.  Last Pain:  Vitals:   10/01/20 1120  TempSrc:   PainSc: 4       Patients Stated Pain Goal: 3 (82/09/90 6893)  Complications: No notable events documented.

## 2020-10-01 NOTE — Op Note (Signed)
OPERATIVE REPORT- TOTAL HIP ARTHROPLASTY   PREOPERATIVE DIAGNOSIS: Osteoarthritis of the Right hip.   POSTOPERATIVE DIAGNOSIS: Osteoarthritis of the Right  hip.   PROCEDURE: Right total hip arthroplasty, anterior approach.   SURGEON: Gaynelle Arabian, MD   ASSISTANT: Theresa Duty, PA-C  ANESTHESIA:  General  ESTIMATED BLOOD LOSS:-700 mL    DRAINS: Hemovac x1.   COMPLICATIONS: None   CONDITION: PACU - hemodynamically stable.   BRIEF CLINICAL NOTE: [name] is a 70 y.o. female who has advanced end-  stage arthritis of their Right  hip with progressively worsening pain and  dysfunction.The patient has failed nonoperative management and presents for  total hip arthroplasty.   PROCEDURE IN DETAIL: After successful administration of spinal  anesthetic, the traction boots for the [facility] bed were placed on both  feet and the patient was placed onto the [facility] bed, boots placed into the leg  holders. The Right hip was then isolated from the perineum with plastic  drapes and prepped and draped in the usual sterile fashion. ASIS and  greater trochanter were marked and a oblique incision was made, starting  at about 1 cm lateral and 2 cm distal to the ASIS and coursing towards  the anterior cortex of the femur. The skin was cut with a 10 blade  through subcutaneous tissue to the level of the fascia overlying the  tensor fascia lata muscle. The fascia was then incised in line with the  incision at the junction of the anterior third and posterior 2/3rd. The  muscle was teased off the fascia and then the interval between the TFL  and the rectus was developed. The Hohmann retractor was then placed at  the top of the femoral neck over the capsule. The vessels overlying the  capsule were cauterized and the fat on top of the capsule was removed.  A Hohmann retractor was then placed anterior underneath the rectus  femoris to give exposure to the entire anterior capsule. A  T-shaped  capsulotomy was performed. The edges were tagged and the femoral head  was identified.       Osteophytes are removed off the superior acetabulum.  The femoral neck was then cut in situ with an oscillating saw. Traction  was then applied to the left lower extremity utilizing the [facility]  traction. The femoral head was then removed. Retractors were placed  around the acetabulum and then circumferential removal of the labrum was  performed. Osteophytes were also removed. Reaming starts at 49 mm to  medialize and  Increased in 2 mm increments to 53 mm. We reamed in  approximately 40 degrees of abduction, 20 degrees anteversion. A 54 mm  pinnacle acetabular shell was then impacted in anatomic position under  fluoroscopic guidance with excellent purchase. We did not need to place  any additional dome screws. A 36 mm neutral + 4 marathon liner was then  placed into the acetabular shell.       The femoral lift was then placed along the lateral aspect of the femur  just distal to the vastus ridge. The leg was  externally rotated and capsule  was stripped off the inferior aspect of the femoral neck down to the  level of the lesser trochanter, this was done with electrocautery. The femur was lifted after this was performed. The  leg was then placed in an extended and adducted position essentially delivering the femur. We also removed the capsule superiorly and the piriformis from the piriformis  fossa to gain excellent exposure of the  proximal femur. Rongeur was used to remove some cancellous bone to get  into the lateral portion of the proximal femur for placement of the  initial starter reamer. The starter broaches was placed  the starter broach  and was shown to go down the center of the canal. Broaching  with the Actis system was then performed starting at size 0  coursing  Up to size 8. A size 8 had excellent torsional and rotational  and axial stability. The trial high offset neck was then  placed  with a 36 + 8.5 trial head. The hip was then reduced. We confirmed that  the stem was in the canal both on AP and lateral x-rays. It also has excellent sizing. The hip was reduced with outstanding stability through full extension and full external rotation.. AP pelvis was taken and the leg lengths were measured and found to be equal. Hip was then dislocated again and the femoral head and neck removed. The  femoral broach was removed. Size 8 Actis stem with a hgh offset  neck was then impacted into the femur following native anteversion. Has  excellent purchase in the canal. Excellent torsional and rotational and  axial stability. It is confirmed to be in the canal on AP and lateral  fluoroscopic views. The 36 + 8.5 ceramic head was placed and the hip  reduced with outstanding stability. Again AP pelvis was taken and it  confirmed that the leg lengths were equal. The wound was then copiously  irrigated with saline solution and the capsule reattached and repaired  with Ethibond suture. 30 ml of .25% Bupivicaine was  injected into the capsule and into the edge of the tensor fascia lata as well as subcutaneous tissue. The fascia overlying the tensor fascia lata was then closed with a running #1 V-Loc. Subcu was closed with interrupted 2-0 Vicryl and subcuticular running 4-0 Monocryl. Incision was cleaned  and dried. Steri-Strips and a bulky sterile dressing applied. The patient was awakened and transported to  recovery in stable condition.        Please note that a surgical assistant was a medical necessity for this procedure to perform it in a safe and expeditious manner. Assistant was necessary to provide appropriate retraction of vital neurovascular structures and to prevent femoral fracture and allow for anatomic placement of the prosthesis.  Gaynelle Arabian, M.D.

## 2020-10-01 NOTE — Discharge Instructions (Signed)
°Kathleen Aluisio, MD °Total Joint Specialist °[facility] [facility] °[address] °[place], [place] [address] °[phone] ° °ANTERIOR APPROACH TOTAL HIP REPLACEMENT POSTOPERATIVE DIRECTIONS ° ° ° ° °Hip Rehabilitation, Guidelines Following Surgery  °The results of a hip operation are greatly improved after range of motion and muscle strengthening exercises. Follow all safety measures which are given to protect your hip. If any of these exercises cause increased pain or swelling in your joint, decrease the amount until you are comfortable again. Then slowly increase the exercises. Call your caregiver if you have problems or questions.  ° °HOME CARE INSTRUCTIONS  °Remove items at home which could result in a fall. This includes throw rugs or furniture in walking pathways.  °ICE to the affected hip as frequently as 20-30 minutes an hour and then as needed for pain and swelling. Continue to use ice on the hip for pain and swelling from surgery. You may notice swelling that will progress down to the foot and ankle. This is normal after surgery. Elevate the leg when you are not up walking on it.   °Continue to use the breathing machine which will help keep your temperature down.  It is common for your temperature to cycle up and down following surgery, especially at night when you are not up moving around and exerting yourself.  The breathing machine keeps your lungs expanded and your temperature down. ° °DIET °You may resume your previous home diet once your are discharged from the hospital. ° °DRESSING / WOUND CARE / SHOWERING °You have an adhesive waterproof bandage over the incision. Leave this in place until your first follow-up appointment. Once you remove this you will not need to place another bandage.  °You may begin showering 3 days following surgery, but do not submerge the incision under water. ° °ACTIVITY °For the first 3-5 days, it is important to rest and keep the operative leg elevated.  You should, as a general rule, rest for 50 minutes and walk/stretch for 10 minutes per hour. After 5 days, you may slowly increase activity as tolerated.  °Perform the exercises you were provided twice a day for about 15-20 minutes each session. Begin these 2 days following surgery. °Walk with your walker as instructed. Use the walker until you are comfortable transitioning to a cane. Walk with the cane in the opposite hand of the operative leg. You may discontinue the cane once you are comfortable and walking steadily. °Avoid periods of inactivity such as sitting longer than an hour when not asleep. This helps prevent blood clots.  °Do not drive a car for 6 weeks or until released by your surgeon.  °Do not drive while taking narcotics. ° °TED HOSE STOCKINGS °Wear the elastic stockings on both legs for three weeks following surgery during the day. You may remove them at night while sleeping. ° °WEIGHT BEARING °Weight bearing as tolerated with assist device (walker, cane, etc) as directed, use it as long as suggested by your surgeon or therapist, typically at least 4-6 weeks. ° °POSTOPERATIVE CONSTIPATION PROTOCOL °Constipation - defined medically as fewer than three stools per week and severe constipation as less than one stool per week. ° °One of the most common issues patients have following surgery is constipation.  Even if you have a regular bowel pattern at home, your normal regimen is likely to be disrupted due to multiple reasons following surgery.  Combination of anesthesia, postoperative narcotics, change in appetite and fluid intake all can affect your bowels.    In order to avoid complications following surgery, here are some recommendations in order to help you during your recovery period. ° °Colace (docusate) - Pick up an over-the-counter form of Colace or another stool softener and take twice a day as long as you are requiring postoperative pain medications.  Take with a full glass of water daily.  If  you experience loose stools or diarrhea, hold the colace until you stool forms back up.  If your symptoms do not get better within 1 week or if they get worse, check with your doctor. °Dulcolax (bisacodyl) - Pick up over-the-counter and take as directed by the product packaging as needed to assist with the movement of your bowels.  Take with a full glass of water.  Use this product as needed if not relieved by Colace only.  °MiraLax (polyethylene glycol) - Pick up over-the-counter to have on hand.  MiraLax is a solution that will increase the amount of water in your bowels to assist with bowel movements.  Take as directed and can mix with a glass of water, juice, soda, coffee, or tea.  Take if you go more than two days without a movement.Do not use MiraLax more than once per day. Call your doctor if you are still constipated or irregular after using this medication for 7 days in a row. ° °If you continue to have problems with postoperative constipation, please contact the office for further assistance and recommendations.  If you experience "the worst abdominal pain ever" or develop nausea or vomiting, please contact the office immediatly for further recommendations for treatment. ° °ITCHING ° If you experience itching with your medications, try taking only a single pain pill, or even half a pain pill at a time.  You can also use Benadryl over the counter for itching or also to help with sleep.  ° °MEDICATIONS °See your medication summary on the “After Visit Summary” that the nursing staff will review with you prior to discharge.  You may have some home medications which will be placed on hold until you complete the course of blood thinner medication.  It is important for you to complete the blood thinner medication as prescribed by your surgeon.  Continue your approved medications as instructed at time of discharge. ° °PRECAUTIONS °If you experience chest pain or shortness of breath - call 911 immediately for  transfer to the hospital emergency department.  °If you develop a fever greater that 101 F, purulent drainage from wound, increased redness or drainage from wound, foul odor from the wound/dressing, or calf pain - CONTACT YOUR SURGEON.   °                                                °FOLLOW-UP APPOINTMENTS °Make sure you keep all of your appointments after your operation with your surgeon and caregivers. You should call the office at the above phone number and make an appointment for approximately two weeks after the date of your surgery or on the date instructed by your surgeon outlined in the "After Visit Summary". ° °RANGE OF MOTION AND STRENGTHENING EXERCISES  °These exercises are designed to help you keep full movement of your hip joint. Follow your caregiver's or physical therapist's instructions. Perform all exercises about fifteen times, three times per day or as directed. Exercise both hips, even if you have had only   one joint replacement. These exercises can be done on a training (exercise) mat, on the floor, on a table or on a bed. Use whatever works the best and is most comfortable for you. Use music or television while you are exercising so that the exercises are a pleasant break in your day. This will make your life better with the exercises acting as a break in routine you can look forward to.  °Lying on your back, slowly slide your foot toward your buttocks, raising your knee up off the floor. Then slowly slide your foot back down until your leg is straight again.  °Lying on your back spread your legs as far apart as you can without causing discomfort.  °Lying on your side, raise your upper leg and foot straight up from the floor as far as is comfortable. Slowly lower the leg and repeat.  °Lying on your back, tighten up the muscle in the front of your thigh (quadriceps muscles). You can do this by keeping your leg straight and trying to raise your heel off the floor. This helps strengthen the  largest muscle supporting your knee.  °Lying on your back, tighten up the muscles of your buttocks both with the legs straight and with the knee bent at a comfortable angle while keeping your heel on the floor.  ° °POST-OPERATIVE OPIOID TAPER INSTRUCTIONS: °It is important to wean off of your opioid medication as soon as possible. If you do not need pain medication after your surgery it is ok to stop day one. °Opioids include: °Codeine, Hydrocodone(Norco, Vicodin), Oxycodone(Percocet, oxycontin) and hydromorphone amongst others.  °Long term and even short term use of opiods can cause: °Increased pain response °Dependence °Constipation °Depression °Respiratory depression °And more.  °Withdrawal symptoms can include °Flu like symptoms °Nausea, vomiting °And more °Techniques to manage these symptoms °Hydrate well °Eat regular healthy meals °Stay active °Use relaxation techniques(deep breathing, meditating, yoga) °Do Not substitute Alcohol to help with tapering °If you have been on opioids for less than two weeks and do not have pain than it is ok to stop all together.  °Plan to wean off of opioids °This plan should start within one week post op of your joint replacement. °Maintain the same interval or time between taking each dose and first decrease the dose.  °Cut the total daily intake of opioids by one tablet each day °Next start to increase the time between doses. °The last dose that should be eliminated is the evening dose.  ° °IF YOU ARE TRANSFERRED TO A SKILLED REHAB FACILITY °If the patient is transferred to a skilled rehab facility following release from the hospital, a list of the current medications will be sent to the facility for the patient to continue.  When discharged from the skilled rehab facility, please have the facility set up the patient's Home Health Physical Therapy prior to being released. Also, the skilled facility will be responsible for providing the patient with their medications at time of  release from the facility to include their pain medication, the muscle relaxants, and their blood thinner medication. If the patient is still at the rehab facility at time of the two week follow up appointment, the skilled rehab facility will also need to assist the patient in arranging follow up appointment in our office and any transportation needs. ° °MAKE SURE YOU:  °Understand these instructions.  °Get help right away if you are not doing well or get worse.  ° ° °DENTAL ANTIBIOTICS: ° °In most   cases prophylactic antibiotics for Dental procdeures after total joint surgery are not necessary. ° °Exceptions are as follows: ° °1. History of prior total joint infection ° °2. Severely immunocompromised (Organ Transplant, cancer chemotherapy, Rheumatoid biologic °meds such as Humera) ° °3. Poorly controlled diabetes (A1C &gt; 8.0, blood glucose over 200) ° °If you have one of these conditions, contact your surgeon for an antibiotic prescription, prior to your °dental procedure.  ° ° °Pick up stool softner and laxative for home use following surgery while on pain medications. °Do not submerge incision under water. °Please use good hand washing techniques while changing dressing each day. °May shower starting three days after surgery. °Please use a clean towel to pat the incision dry following showers. °Continue to use ice for pain and swelling after surgery. °Do not use any lotions or creams on the incision until instructed by your surgeon. ° °

## 2020-10-02 ENCOUNTER — Encounter (HOSPITAL_COMMUNITY): Payer: Self-pay | Admitting: Orthopedic Surgery

## 2020-10-02 DIAGNOSIS — M1611 Unilateral primary osteoarthritis, right hip: Secondary | ICD-10-CM | POA: Diagnosis not present

## 2020-10-02 LAB — BASIC METABOLIC PANEL
Anion gap: 8 (ref 5–15)
BUN: 13 mg/dL (ref 8–23)
CO2: 24 mmol/L (ref 22–32)
Calcium: 9.3 mg/dL (ref 8.9–10.3)
Chloride: 105 mmol/L (ref 98–111)
Creatinine, Ser: 0.67 mg/dL (ref 0.44–1.00)
GFR, Estimated: >60 mL/min (ref >60)
Glucose, Bld: 155 mg/dL — ABNORMAL HIGH (ref 70–99)
Potassium: 4.1 mmol/L (ref 3.5–5.1)
Sodium: 137 mmol/L (ref 135–145)

## 2020-10-02 LAB — CBC
HCT: 33.6 % — ABNORMAL LOW (ref 36.0–46.0)
Hemoglobin: 10.9 g/dL — ABNORMAL LOW (ref 12.0–15.0)
MCH: 28.3 pg (ref 26.0–34.0)
MCHC: 32.4 g/dL (ref 30.0–36.0)
MCV: 87.3 fL (ref 80.0–100.0)
Platelets: 155 10*3/uL (ref 150–400)
RBC: 3.85 MIL/uL — ABNORMAL LOW (ref 3.87–5.11)
RDW: 14.5 % (ref 11.5–15.5)
WBC: 10.1 10*3/uL (ref 4.0–10.5)
nRBC: 0 % (ref 0.0–0.2)

## 2020-10-02 MED ORDER — METHOCARBAMOL 500 MG PO TABS: 500.0000 mg | ORAL_TABLET | Freq: Four times a day (QID) | ORAL | 0 refills | Status: DC | PRN

## 2020-10-02 MED ORDER — HYDROCODONE-ACETAMINOPHEN 5-325 MG PO TABS: 1.0000 | ORAL_TABLET | Freq: Four times a day (QID) | ORAL | 0 refills | Status: DC | PRN

## 2020-10-02 MED ORDER — TRAMADOL HCL 50 MG PO TABS: 50.0000 mg | ORAL_TABLET | Freq: Four times a day (QID) | ORAL | 0 refills | Status: DC | PRN

## 2020-10-02 NOTE — Progress Notes (Signed)
Subjective: 1 Day Post-Op Procedure(s) (LRB): TOTAL HIP ARTHROPLASTY ANTERIOR APPROACH (Right) Patient reports pain as mild.   Patient seen in rounds by Dr. Wynelle Link. Patient is well, and has had no acute complaints or problems. Denies SOB, chest pain, or calf pain. No acute overnight events. Will begin PT today.    Objective: Vital signs in last 24 hours: Temp:  [97.7 F (36.5 C)-98.4 F (36.9 C)] 98.4 F (36.9 C) (09/22 0530) Pulse Rate:  [57-96] 96 (09/22 0530) Resp:  [8-16] 16 (09/22 0530) BP: (107-171)/(54-82) 136/82 (09/22 0530) SpO2:  [93 %-100 %] 93 % (09/22 0530) Weight:  [448 kg] 122 kg (09/21 1120)  Intake/Output from previous day:  Intake/Output Summary (Last 24 hours) at 10/02/2020 0743 Last data filed at 10/02/2020 0502 Gross per 24 hour  Intake 1452.35 ml  Output 1775 ml  Net -322.65 ml     Intake/Output this shift: No intake/output data recorded.  Labs: Recent Labs    10/02/20 0324  HGB 10.9*   Recent Labs    10/02/20 0324  WBC 10.1  RBC 3.85*  HCT 33.6*  PLT 155   Recent Labs    10/02/20 0324  NA 137  K 4.1  CL 105  CO2 24  BUN 13  CREATININE 0.67  GLUCOSE 155*  CALCIUM 9.3   No results for input(s): LABPT, INR in the last 72 hours.  Exam: General - Patient is Alert and Oriented Extremity - Neurologically intact Neurovascular intact Intact pulses distally Dorsiflexion/Plantar flexion intact Dressing - dressing C/D/I Motor Function - intact, moving foot and toes well on exam.   Past Medical History:  Diagnosis Date   Anemia    oral iron occ.   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   -"wears compression hose all times" left leg   FH: genetic disease carrier 11/05/2016   Genetic testing 11/04/2016   Multi-Cancer panel (83 genes) @ [facility] - see report; special interpretation   Glaucoma    [employer]ia 12/19/2019   History of ovarian cancer    HTN (hypertension)    Hyperlipidemia    Hypertension    [employer] 12/19/2019   OSA (obstructive sleep apnea)    hypoventilation, on CPAP   Osteopenia 2019   Pneumonia    Shingles    Sleep apnea    [employer] 12/19/2019    Assessment/Plan: 1 Day Post-Op Procedure(s) (LRB): TOTAL HIP ARTHROPLASTY ANTERIOR APPROACH (Right) Active Problems:   S/P total right hip arthroplasty  Estimated body mass index is 42.13 kg/m as calculated from the following:   Height as of this encounter: _0  (1.702 m).   Weight as of this encounter: 122 kg. Up with therapy  DVT Prophylaxis - Xarelto and TED hose Weight bearing as tolerated. Continue therapy.  Plan is to go Home after hospital stay.   Plan for two sessions with PT this morning, and if meeting  goals, will plan for discharge this afternoon.   Patient to follow up in two weeks with Dr. Wynelle Link in clinic.   The PDMP database was reviewed today prior to any opioid medications being prescribed to this patient.Fenton Foy, [place], PA-C Orthopedic Surgery [phone] 10/02/2020, 7:43 AM

## 2020-10-02 NOTE — Evaluation (Signed)
Physical Therapy One Time Evaluation Patient Details Name: [name] MRN: [id number] DOB: [birth year] Today's Date: 10/02/2020  History of Present Illness  Patient is 70 y.o. female s/p Rt THA direct anterior approach on 10/01/20.  PMH significant for OA, ovarian cancer, DVT, anemia, HTN, HLD, Osteopenia, Lt THA anterior approach on 06/25/20  Clinical Impression  Patient evaluated by Physical Therapy with no further acute PT needs identified. All education has been completed and the patient has no further questions.  Pt ambulated in hallway, practiced safe stair technique and performed LE exercises.  Pt reports understanding and feels ready to d/c home today. See below for any follow-up Physical Therapy or equipment needs. PT is signing off. Thank you for this referral.        Recommendations for follow up therapy are one component of a multi-disciplinary discharge planning process, led by the attending physician.  Recommendations may be updated based on patient status, additional functional criteria and insurance authorization.  Follow Up Recommendations Follow surgeon's recommendation for DC plan and follow-up therapies    Equipment Recommendations  None recommended by PT    Recommendations for Other Services       Precautions / Restrictions Precautions Precautions: Fall Restrictions Weight Bearing Restrictions: No RLE Weight Bearing: Weight bearing as tolerated      Mobility  Bed Mobility Overal bed mobility: Needs Assistance Bed Mobility: Supine to Sit     Supine to sit: HOB elevated;Supervision          Transfers Overall transfer level: Needs assistance Equipment used: Rolling walker (2 wheeled) Transfers: Sit to/from Stand Sit to Stand: Min guard         General transfer comment: verbal cues for hand placement  Ambulation/Gait Ambulation/Gait assistance: Min guard Gait Distance (Feet): 50 Feet Assistive device: Rolling walker (2 wheeled) Gait  Pattern/deviations: Step-to pattern;Decreased stance time - right;Antalgic     General Gait Details: verbal cues for sequence, RW positioning, posture  Stairs Stairs: Yes Stairs assistance: Min guard Stair Management: Step to pattern;Forwards;Two rails Number of Stairs: 2 General stair comments: verbal cues for sequence and safety; pt declined practicing more then once  Wheelchair Mobility    Modified Rankin (Stroke Patients Only)       Balance                                             Pertinent Vitals/Pain Pain Assessment: 0-10 Pain Score: 5  Pain Location: rt hip Pain Descriptors / Indicators: Aching;Sore Pain Intervention(s): Repositioned;Monitored during session    [place] expects to be discharged to:: Private residence Living Arrangements: Alone Available Help at Discharge: Family Type of Home: House Home Access: Stairs to enter Entrance Stairs-Rails: Right;Left;Can reach both Entrance Stairs-Number of Steps: 2 Home Layout: One level [place]: Walker - 2 wheels;Crutches;Cane - single point;Toilet riser;Walker - 4 wheels Additional Comments: pt's sister in law and borther will help her upon d/c    Prior Function Level of Independence: Independent with assistive device(s)               Hand Dominance        Extremity/Trunk Assessment        Lower Extremity Assessment Lower Extremity Assessment: RLE deficits/detail RLE Deficits / Details: anticipated post op hip weakness grossly 2+/5, able to perform ankle pumps       Communication  Communication: No difficulties  Cognition Arousal/Alertness: Awake/alert Behavior During Therapy: WFL for tasks assessed/performed Overall Cognitive Status: Within Functional Limits for tasks assessed                                        General Comments      Exercises Total Joint Exercises Ankle Circles/Pumps: AROM;Both;10 reps Quad Sets:  AROM;Right;10 reps Heel Slides: AAROM;Right;10 reps Hip ABduction/ADduction: AROM;AAROM;Right;10 reps;Supine;Standing Long Arc Quad: AROM;Right;Seated;10 reps Knee Flexion: AROM;Right;Standing;10 reps Marching in Standing: Right;10 reps;AROM;Standing   Assessment/Plan    PT Assessment All further PT needs can be met in the next venue of care  PT Problem List Decreased strength;Decreased mobility;Decreased activity tolerance;Decreased knowledge of use of DME;Pain;Decreased range of motion       PT Treatment Interventions      PT Goals (Current goals can be found in the Care Plan section)  Acute Rehab PT Goals PT Goal Formulation: All assessment and education complete, DC therapy    Frequency     Barriers to discharge        Co-evaluation               AM-PAC PT "6 Clicks" Mobility  Outcome Measure Help needed turning from your back to your side while in a flat bed without using bedrails?: A Little Help needed moving from lying on your back to sitting on the side of a flat bed without using bedrails?: A Little Help needed moving to and from a bed to a chair (including a wheelchair)?: A Little Help needed standing up from a chair using your arms (e.g., wheelchair or bedside chair)?: A Little Help needed to walk in hospital room?: A Little Help needed climbing 3-5 steps with a railing? : A Little 6 Click Score: 18    End of Session Equipment Utilized During Treatment: Gait belt Activity Tolerance: Patient tolerated treatment well Patient left: with call bell/phone within reach;in chair;with chair alarm set;with family/visitor present   PT Visit Diagnosis: Other abnormalities of gait and mobility (R26.89)    Time: 4239-5320 PT Time Calculation (min) (ACUTE ONLY): 23 min   Charges:   PT Evaluation $PT Eval Low Complexity: 1 Low PT Treatments $Therapeutic Exercise: 8-22 mins      Jannette Spanner PT, DPT Acute Rehabilitation Services Pager: [phone] Office:  [place] 10/02/2020, 12:29 PM

## 2020-10-02 NOTE — Plan of Care (Signed)

## 2020-10-02 NOTE — Progress Notes (Signed)
Provided discharge education/instructions, all questions and concerns addressed, Pt not in acute distress, discharged home with belongings accompanied by family. 

## 2020-10-02 NOTE — TOC Transition Note (Signed)
Transition of Care [facility]) - CM/SW Discharge Note  Patient Details  Name: [name] MRN: [id number] Date of Birth: 29-Feb-1950  Transition of Care [facility]) CM/SW Contact:  Sherie Don, LCSW Phone Number: 10/02/2020, 9:20 AM  Clinical Narrative: Patient is expected to discharge home after working with PT. CSW spoke with patient to confirm discharge plan and needs. Patient will discharge home with a home exercise program (HEP) and then transition to [place]. Patient has a rolling walker, 3N1, elevated toilets, rollator, and cane at home so there are no DME needs at this time. TOC signing off.  Final next level of care: Home/Self Care Barriers to Discharge: No Barriers Identified  Patient Goals and CMS Choice Patient states their goals for this hospitalization and ongoing recovery are:: Discharge home with HEP then transition to HEP CMS Medicare.gov Compare Post Acute Care list provided to:: Patient Choice offered to / list presented to : NA  Discharge Plan and Services       DME Arranged: N/A DME Agency: NA  Readmission Risk Interventions No flowsheet data found.

## 2020-10-08 NOTE — Discharge Summary (Signed)
Physician Discharge Summary   Patient ID: [name] MRN: [id number] DOB/AGE: 72/04/1950 72 y.o.  Admit date: 10/01/2020 Discharge date: 10/02/2020  Primary Diagnosis:  s/p THA (Right)  Admission Diagnoses:  Past Medical History:  Diagnosis Date   Anemia    oral iron occ.   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   -"wears compression hose all times" left leg   FH: genetic disease carrier 11/05/2016   Genetic testing 11/04/2016   Multi-Cancer panel (83 genes) @ [facility] - see report; special interpretation   Glaucoma    Phreesia 12/19/2019   History of ovarian cancer    HTN (hypertension)    Hyperlipidemia    Hypertension    [employer]reesia 12/19/2019   OSA (obstructive sleep apnea)    hypoventilation, on CPAP   Osteopenia 2019   Pneumonia    Shingles    Sleep apnea    Phreesia 12/19/2019   Discharge Diagnoses:   Active Problems:   S/P total right hip arthroplasty  Estimated body mass index is 42.13 kg/m as calculated from the following:   Height as of this encounter: '5\' 7"'  (1.702 m).   Weight as of this encounter: 122 kg.  Procedure:  Procedure(s) (LRB): TOTAL HIP ARTHROPLASTY ANTERIOR APPROACH (Right)   Consults: None  HPI: [name] is a 70 y.o. female who has advanced end-  stage arthritis of their Right  hip with progressively worsening pain and  dysfunction.The patient has failed nonoperative management and presents for  total hip arthroplasty.   Laboratory Data: Admission on 10/01/2020, Discharged on  10/02/2020  Component Date Value Ref Range Status   WBC 10/02/2020 10.1  4.0 - 10.5 K/uL Final   RBC 10/02/2020 3.85 (A) 3.87 - 5.11 MIL/uL Final   Hemoglobin 10/02/2020 10.9 (A) 12.0 - 15.0 g/dL Final   HCT 10/02/2020 33.6 (A) 36.0 - 46.0 % Final   MCV 10/02/2020 87.3  80.0 - 100.0 fL Final   MCH 10/02/2020 28.3  26.0 - 34.0 pg Final   MCHC 10/02/2020 32.4  30.0 - 36.0 g/dL Final   RDW 10/02/2020 14.5  11.5 - 15.5 % Final   Platelets 10/02/2020 155  150 - 400 K/uL Final   nRBC 10/02/2020 0.0  0.0 - 0.2 % Final   Performed at [facility], [place] [address] [place], [place] [address]   Sodium 10/02/2020 137  135 - 145 mmol/L Final   Potassium 10/02/2020 4.1  3.5 - 5.1 mmol/L Final   Chloride 10/02/2020 105  98 - 111 mmol/L Final   CO2 10/02/2020 24  22 - 32 mmol/L Final   Glucose, Bld 10/02/2020 155 (A) 70 - 99 mg/dL Final   Glucose reference range applies only to samples taken after fasting for at least 8 hours.   BUN 10/02/2020 13  8 - 23 mg/dL Final   Creatinine, Ser 10/02/2020 0.67  0.44 -  1.00 mg/dL Final   Calcium 10/02/2020 9.3  8.9 - 10.3 mg/dL Final   GFR, Estimated 10/02/2020 >60  >60 mL/min Final   Comment: (NOTE) Calculated using the CKD-EPI Creatinine Equation (2021)    Anion gap 10/02/2020 8  5 - 15 Final   Performed at [facility], [place] [address] [place], [place] [address]  Orders Only on 09/29/2020  Component Date Value Ref Range Status   SARS Coronavirus 2 09/29/2020 RESULT: NEGATIVE   Final   Comment: RESULT: NEGATIVESARS-CoV-2 INTERPRETATION:A NEGATIVE  test result means that SARS-CoV-2 RNA was not present in the specimen above the limit of detection of this test. This does not preclude a possible SARS-CoV-2 infection and should not be used as the  sole basis for patient management decisions. Negative results must be combined with clinical observations, patient history, and epidemiological information. Optimum specimen types and  timing for peak viral levels during infections caused by SARS-CoV-2  have not been determined. Collection of multiple specimens or types of specimens may be necessary to detect virus. Improper specimen collection and handling, sequence variability under primers/probes, or organism present below the limit of detection may  lead to false negative results. Positive and negative predictive values of testing are highly dependent on prevalence. False negative test results are more likely when prevalence of disease is high.The expected result is NEGATIVE.Fact S                          heet for  Healthcare Providers: LocalChronicle.no Sheet for Patients: SalonLookup.es Reference Range - Negative   Hospital Outpatient Visit on 09/18/2020  Component Date Value Ref Range Status   MRSA, PCR 09/18/2020 NEGATIVE  NEGATIVE Final   Staphylococcus aureus 09/18/2020 NEGATIVE  NEGATIVE Final   Comment: (NOTE) The Xpert SA Assay (FDA approved for NASAL specimens in patients 27 years of age and older), is one component of a comprehensive surveillance program. It is not intended to diagnose infection nor to guide or monitor treatment. Performed at [facility], [place] [address] [place], [place] [address]    WBC 09/18/2020 3.6 (A) 4.0 - 10.5 K/uL Final   RBC 09/18/2020 4.51  3.87 - 5.11 MIL/uL Final   Hemoglobin 09/18/2020 12.7  12.0 - 15.0 g/dL Final   HCT 09/18/2020 40.4  36.0 - 46.0 % Final   MCV 09/18/2020 89.6  80.0 - 100.0 fL Final   MCH 09/18/2020 28.2  26.0 - 34.0 pg Final   MCHC 09/18/2020 31.4  30.0 - 36.0 g/dL Final   RDW 09/18/2020 14.3  11.5 - 15.5 % Final   Platelets 09/18/2020 162  150 - 400 K/uL Final   nRBC 09/18/2020 0.0  0.0 - 0.2 % Final   Performed at [facility], [place] [address] [place], [place] [address]   Sodium 09/18/2020 141  135 - 145 mmol/L Final   Potassium 09/18/2020 4.2  3.5 - 5.1  mmol/L Final   Chloride 09/18/2020 107  98 - 111 mmol/L Final   CO2 09/18/2020 27  22 - 32 mmol/L Final   Glucose, Bld 09/18/2020 142 (A) 70 - 99 mg/dL Final   Glucose reference range applies only to samples taken after fasting for at least 8 hours.   BUN 09/18/2020 21  8 - 23 mg/dL Final   Creatinine, Ser 09/18/2020 0.67  0.44 - 1.00 mg/dL Final   Calcium 09/18/2020 9.5  8.9 - 10.3 mg/dL Final   Total Protein  09/18/2020 6.9  6.5 - 8.1 g/dL Final   Albumin 09/18/2020 4.2  3.5 - 5.0 g/dL Final   AST 09/18/2020 27  15 - 41 U/L Final   ALT 09/18/2020 21  0 - 44 U/L Final   Alkaline Phosphatase 09/18/2020 106  38 - 126 U/L Final   Total Bilirubin 09/18/2020 0.4  0.3 - 1.2 mg/dL Final   GFR, Estimated 09/18/2020 >60  >60 mL/min Final   Comment: (NOTE) Calculated using the CKD-EPI Creatinine Equation (2021)    Anion gap 09/18/2020 7  5 - 15 Final   Performed at [facility], [place] [address], [place], [place] [address]   ABO/RH(D) 09/18/2020 O POS   Final   Antibody Screen 09/18/2020 NEG   Final   Sample Expiration 09/18/2020 10/02/2020,2359   Final   Extend sample reason 09/18/2020    Final                   Value:NO TRANSFUSIONS OR PREGNANCY IN THE PAST 3 MONTHS Performed at [place] [address], [place], [place] [address]    Prothrombin Time 09/18/2020 13.9  11.4 - 15.2 seconds Final   INR 09/18/2020 1.1  0.8 - 1.2 Final   Comment: (NOTE) INR goal varies based on device and disease states. Performed at [facility], [place] [address] [place], [place] [address]      X-Rays:DG Pelvis Portable  Result Date: 10/01/2020 CLINICAL DATA:  Status post right total hip replacement. EXAM: PORTABLE PELVIS 1-2 VIEWS COMPARISON:  June 25, 2020. FINDINGS: The right femoral and tibial components are well situated. Expected postoperative changes are seen in the surrounding soft tissues. IMPRESSION: Status post right total hip arthroplasty.  Electronically Signed   By: Marijo Conception M.D.   On: 10/01/2020 16:36   DG C-Arm 1-60 Min-No Report  Result Date: 10/01/2020 Fluoroscopy was utilized by the requesting physician.  No radiographic interpretation.   DG HIP OPERATIVE UNILAT W OR W/O PELVIS RIGHT  Result Date: 10/01/2020 CLINICAL DATA:  Right total hip replacement. EXAM: OPERATIVE RIGHT HIP (WITH PELVIS IF PERFORMED) 1 VIEWS TECHNIQUE: Fluoroscopic spot image(s) were submitted for interpretation post-operatively. COMPARISON:  Radiographs June 25, 2020. FINDINGS: Fluoro time: 9 seconds. A single C-arm fluoroscopic image was obtained intraoperatively and submitted for post operative interpretation. This image demonstrates interval right total hip arthroplasty, partially imaged. Prior left total hip arthroplasty is also partially imaged. Please see the performing provider's procedural report for further detail. IMPRESSION: Intraoperative fluoroscopy, as detailed above. Electronically Signed   By: Margaretha Sheffield M.D.   On: 10/01/2020 14:37    EKG: Orders placed or performed in visit on 11/27/18   EKG 12-Lead     Hospital Course: [place] is a 70 y.o. who was admitted to [facility]. They were brought to the operating room on 10/01/2020 and underwent Procedure(s): [place].  Patient tolerated the procedure well and was later transferred to the recovery room and then to the orthopaedic floor for postoperative care. They were given PO and IV analgesics for pain control following their surgery. They were given 24 hours of postoperative antibiotics of  Anti-infectives (From admission, onward)    Start     Dose/Rate Route Frequency Ordered Stop   10/01/20 1800  ceFAZolin (ANCEF) IVPB 2g/100 mL premix        2 g 200 mL/hr over 30 Minutes Intravenous Every 6 hours 10/01/20 1545 10/01/20 2347  10/01/20 1100  ceFAZolin (ANCEF) IVPB 3g/100 mL premix        3 g 200 mL/hr over 30 Minutes  Intravenous On call to O.R. 10/01/20 1059 10/01/20 1235      and started on DVT prophylaxis in the form of Xarelto and TED hose.   PT and OT were ordered for total joint protocol. Discharge planning consulted to help with postop disposition and equipment needs.  Patient had an uneventful night on the evening of surgery. They started to get up OOB with therapy on 10/02/2020. Pt was seen during rounds and was ready to go home pending progress with therapy. She worked with therapy on POD #1 and was meeting goals. Pt was discharged to home later that day in stable condition.  Diet: Regular diet Activity: WBAT Follow-up: in two weeks Disposition: Home Discharged Condition: good   Discharge Instructions     Call MD / Call 911   Complete by: As directed    If you experience chest pain or shortness of breath, CALL 911 and be transported to the hospital emergency room.  If you develope a fever above 101 F, pus (white drainage) or increased drainage or redness at the wound, or calf pain, call your surgeon's office.   Change dressing   Complete by: As directed    You have an adhesive waterproof bandage over the incision. Leave this in place until your first follow-up appointment. Once you remove this you will not need to place another bandage.   Constipation Prevention   Complete by: As directed    Drink plenty of fluids.  Prune juice may be helpful.  You may use a stool softener, such as Colace (over the counter) 100 mg twice a day.  Use MiraLax (over the counter) for constipation as needed.   Diet - low sodium heart healthy   Complete by: As directed    Do not sit on low chairs, stoools or toilet seats, as it may be difficult to get up from low surfaces   Complete by: As directed    Driving restrictions   Complete by: As directed    No driving for two weeks   Post-operative opioid taper instructions:   Complete by: As directed    POST-OPERATIVE OPIOID TAPER INSTRUCTIONS: It is important to  wean off of your opioid medication as soon as possible. If you do not need pain medication after your surgery it is ok to stop day one. Opioids include: Codeine, Hydrocodone(Norco, Vicodin), Oxycodone(Percocet, oxycontin) and hydromorphone amongst others.  Long term and even short term use of opiods can cause: Increased pain response Dependence Constipation Depression Respiratory depression And more.  Withdrawal symptoms can include Flu like symptoms Nausea, vomiting And more Techniques to manage these symptoms Hydrate well Eat regular healthy meals Stay active Use relaxation techniques(deep breathing, meditating, yoga) Do Not substitute Alcohol to help with tapering If you have been on opioids for less than two weeks and do not have pain than it is ok to stop all together.  Plan to wean off of opioids This plan should start within one week post op of your joint replacement. Maintain the same interval or time between taking each dose and first decrease the dose.  Cut the total daily intake of opioids by one tablet each day Next start to increase the time between doses. The last dose that should be eliminated is the evening dose.      TED hose   Complete by: As directed  Use stockings (TED hose) for three weeks on both leg(s).  You may remove them at night for sleeping.   Weight bearing as tolerated   Complete by: As directed       Allergies as of 10/02/2020       Reactions   Valtrex [valacyclovir Hcl] Swelling, Rash   Swelling of face and arm, rash   Benoxinate Base    Turns eyes blood red/burning. --in eyedrops--   Iodinated Diagnostic Agents Rash   Rash on back Other reaction(s): Unknown   Sulfa Antibiotics Rash        Medication List     STOP taking these medications    acetaminophen 650 MG CR tablet Commonly known as: TYLENOL       TAKE these medications    atorvastatin 40 MG tablet Commonly known as: LIPITOR Take 1 tablet (40 mg total) by mouth  daily.   ferrous sulfate 325 (65 FE) MG tablet Take 325 mg by mouth daily with breakfast. Notes to patient: Resume home regimen   fish oil-omega-3 fatty acids 1000 MG capsule Take 1 g by mouth 2 (two) times daily. Notes to patient: Resume home regimen   fluticasone 50 MCG/ACT nasal spray Commonly known as: FLONASE Place 1 spray into both nostrils 2 (two) times daily. What changed:  how much to take when to take this   furosemide 20 MG tablet Commonly known as: LASIX Take 1 tablet (20 mg total) by mouth daily.   HYDROcodone-acetaminophen 5-325 MG tablet Commonly known as: NORCO/VICODIN Take 1-2 tablets by mouth every 6 (six) hours as needed for severe pain. Notes to patient: Last dose given 09/22 09:24am   lisinopril 40 MG tablet Commonly known as: ZESTRIL Take 1 tablet (40 mg total) by mouth daily. Notes to patient: Resume home regimen   methocarbamol 500 MG tablet Commonly known as: ROBAXIN Take 1 tablet (500 mg total) by mouth every 6 (six) hours as needed for muscle spasms. Notes to patient: Last dose given 03:38am   multivitamin tablet Take 1 tablet by mouth every morning. Notes to patient: Resume home regimen   niacin 500 MG tablet Take 1 tablet (500 mg total) by mouth every evening. Notes to patient: Resume home regimen   nystatin cream Commonly known as: MYCOSTATIN Apply 1 application topically 2 (two) times daily. What changed:  when to take this reasons to take this Notes to patient: Resume home regimen   ondansetron 4 MG tablet Commonly known as: ZOFRAN Take 1 tablet (4 mg total) by mouth every 6 (six) hours as needed for nausea. Notes to patient: Last dose given 09/21 06:17pm   OVER THE COUNTER MEDICATION Apply 1 application topically daily. Fungi Care for toenails   rivaroxaban 20 MG Tabs tablet Commonly known as: Xarelto Take 1 tablet (20 mg total) by mouth daily.   traMADol 50 MG tablet Commonly known as: ULTRAM Take 1-2 tablets (50-100  mg total) by mouth every 6 (six) hours as needed for moderate pain. Notes to patient: Last dose given 03:41am   Vitamin D 50 MCG (2000 UT) tablet Take 2,000 Units by mouth daily. TAKES EVERY EVE Notes to patient: Resume home regimen               Discharge Care Instructions  (From admission, onward)           Start     Ordered   10/02/20 0000  Weight bearing as tolerated        10/02/20 0749  10/02/20 0000  Change dressing       Comments: You have an adhesive waterproof bandage over the incision. Leave this in place until your first follow-up appointment. Once you remove this you will not need to place another bandage.   10/02/20 0749            Follow-up Information     Gaynelle Arabian, MD Follow up in 2 week(s).   Specialty: Orthopedic Surgery Contact information: [address] [place] [address] [phone]                 Signed: Fenton Foy, MBA, PA-C Orthopedic Surgery 10/08/2020, 8:28 AM

## 2021-03-18 ENCOUNTER — Other Ambulatory Visit: Payer: Self-pay | Admitting: Gastroenterology

## 2021-06-11 ENCOUNTER — Encounter (HOSPITAL_BASED_OUTPATIENT_CLINIC_OR_DEPARTMENT_OTHER): Payer: Self-pay | Admitting: *Deleted

## 2021-06-16 ENCOUNTER — Encounter (HOSPITAL_COMMUNITY): Payer: Self-pay | Admitting: Gastroenterology

## 2021-06-26 ENCOUNTER — Other Ambulatory Visit: Payer: Self-pay

## 2021-06-26 ENCOUNTER — Ambulatory Visit (HOSPITAL_COMMUNITY)
Admission: RE | Admit: 2021-06-26 | Discharge: 2021-06-26 | Disposition: A | Discharge status: Home / Self Care | Payer: Medicare PPO | Attending: Gastroenterology | Admitting: Gastroenterology

## 2021-06-26 ENCOUNTER — Ambulatory Visit (HOSPITAL_COMMUNITY): Payer: Medicare PPO | Admitting: Certified Registered Nurse Anesthetist

## 2021-06-26 ENCOUNTER — Encounter (HOSPITAL_COMMUNITY)
Admission: RE | Disposition: A | Discharge status: Home / Self Care | Payer: Self-pay | Source: Home / Self Care | Attending: Gastroenterology

## 2021-06-26 ENCOUNTER — Ambulatory Visit (HOSPITAL_BASED_OUTPATIENT_CLINIC_OR_DEPARTMENT_OTHER): Payer: Medicare PPO | Admitting: Certified Registered Nurse Anesthetist

## 2021-06-26 ENCOUNTER — Encounter (HOSPITAL_COMMUNITY): Payer: Self-pay | Admitting: Gastroenterology

## 2021-06-26 DIAGNOSIS — Z86010 Personal history of colonic polyps: Secondary | ICD-10-CM | POA: Insufficient documentation

## 2021-06-26 DIAGNOSIS — D122 Benign neoplasm of ascending colon: Secondary | ICD-10-CM | POA: Diagnosis not present

## 2021-06-26 DIAGNOSIS — Z800 Family history of malignant neoplasm of digestive organs: Secondary | ICD-10-CM | POA: Insufficient documentation

## 2021-06-26 DIAGNOSIS — D124 Benign neoplasm of descending colon: Secondary | ICD-10-CM | POA: Insufficient documentation

## 2021-06-26 DIAGNOSIS — I10 Essential (primary) hypertension: Secondary | ICD-10-CM

## 2021-06-26 DIAGNOSIS — K5730 Diverticulosis of large intestine without perforation or abscess without bleeding: Secondary | ICD-10-CM

## 2021-06-26 DIAGNOSIS — Z79899 Other long term (current) drug therapy: Secondary | ICD-10-CM | POA: Insufficient documentation

## 2021-06-26 DIAGNOSIS — Z1211 Encounter for screening for malignant neoplasm of colon: Secondary | ICD-10-CM | POA: Diagnosis present

## 2021-06-26 DIAGNOSIS — K635 Polyp of colon: Secondary | ICD-10-CM | POA: Diagnosis not present

## 2021-06-26 DIAGNOSIS — G4733 Obstructive sleep apnea (adult) (pediatric): Secondary | ICD-10-CM | POA: Insufficient documentation

## 2021-06-26 HISTORY — PX: COLONOSCOPY WITH PROPOFOL: SHX5780

## 2021-06-26 HISTORY — PX: POLYPECTOMY: SHX5525

## 2021-06-26 SURGERY — COLONOSCOPY WITH PROPOFOL
Anesthesia: Monitor Anesthesia Care

## 2021-06-26 MED ORDER — PROPOFOL 500 MG/50ML IV EMUL
INTRAVENOUS | Status: AC
  Filled 2021-06-26: qty 50

## 2021-06-26 MED ORDER — SODIUM CHLORIDE 0.9 % IV SOLN: INTRAVENOUS | Status: DC

## 2021-06-26 MED ORDER — PROPOFOL 500 MG/50ML IV EMUL
INTRAVENOUS | Status: DC | PRN
  Administered 2021-06-26: 125 ug/kg/min via INTRAVENOUS

## 2021-06-26 MED ORDER — LACTATED RINGERS IV SOLN: INTRAVENOUS | Status: DC

## 2021-06-26 MED ORDER — LACTATED RINGERS IV SOLN
INTRAVENOUS | Status: AC | PRN
  Administered 2021-06-26: 10 mL/h via INTRAVENOUS

## 2021-06-26 MED ORDER — PROPOFOL 10 MG/ML IV BOLUS
INTRAVENOUS | Status: DC | PRN
  Administered 2021-06-26: 30 mg via INTRAVENOUS
  Administered 2021-06-26: 20 mg via INTRAVENOUS

## 2021-06-26 SURGICAL SUPPLY — 22 items

## 2021-06-26 NOTE — H&P (Signed)
[name] HPI:  This 71  year old white female presents to the office for colorectal cancer screening. She has 1 BM per day with occasional diarrhea. There is no obvious blood or mucus in the stool. She has a good appetite. She has lost 51 pounds over the past 3 years; weight loss has been intentional. She denies having any complaints of abdominal pain, nausea, vomiting, acid reflux, dysphagia or odynophagia. She denies having a family history of celiac sprue or IBD. Her last colonoscopy done on 05/21/2014 that revealed diverticulosis and a hyperplastic polyp was removed. She had a tubular adenoma removed in 2011. Her father was diagnosed with colon cancer in his 61's. She reportedly had a negative Cologuard done 2021 but we do not have those records available to [place] at the present time.   Past Medical History:  Diagnosis Date   Anemia    oral iron occ.   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   -"wears compression hose all times" left leg   FH: genetic disease carrier 11/05/2016   Genetic testing 11/04/2016   Multi-Cancer panel (83 genes) @ [facility] - see report; special interpretation   Glaucoma    Phreesia 12/19/2019   History of ovarian cancer    HTN (hypertension)    Hyperlipidemia    Hypertension    Phreesia 12/19/2019   OSA (obstructive sleep apnea)    hypoventilation, on CPAP   Osteopenia 2019   Pneumonia    Shingles    Sleep apnea    Phreesia 12/19/2019    Past Surgical History:  Procedure Laterality Date   ABDOMINAL HYSTERECTOMY      BASAL CELL CARCINOMA EXCISION     resection with plastic surgery reconstrustion   BREAST LUMPECTOMY Left 1970   benign   CATARACT EXTRACTION Bilateral 2002, 2005   with bilateral lens replacements   COLONOSCOPY WITH PROPOFOL N/A 05/21/2014   Procedure: COLONOSCOPY WITH PROPOFOL;  Surgeon: Juanita Craver, MD;  Location: [facility];  Service: Endoscopy;  Laterality: N/A;   EYE SURGERY     HERNIA REPAIR  6389   umbilical repair with mesh    TOTAL ABDOMINAL HYSTERECTOMY W/ BILATERAL SALPINGOOPHORECTOMY  2005   Stage IA ovarian cancer   TOTAL HIP ARTHROPLASTY Left 06/25/2020   Procedure: TOTAL HIP ARTHROPLASTY ANTERIOR APPROACH;  Surgeon: Gaynelle Arabian, MD;  Location: [facility];  Service: Orthopedics;  Laterality: Left;  180mn   TOTAL HIP ARTHROPLASTY Right 10/01/2020   Procedure: TOTAL HIP ARTHROPLASTY ANTERIOR APPROACH;  Surgeon: AGaynelle Arabian MD;  Location: [facility];  Service: Orthopedics;  Laterality: Right;    Family History  Problem Relation Age of Onset   Alzheimer's disease Mother    Hypertension Mother    Alzheimer's disease Father    Hypertension Father    Colon cancer Father 865      deceased 874  Hypertension Sister  Breast cancer Sister 80   Parkinson's disease Sister    Hypertension Brother    Prostate cancer Brother 75   Prostate cancer Other        father's maternal half-brother   Breast cancer Other 70       daughter of brother; currently 32    Social History:  reports that she has never smoked. She has never used smokeless tobacco. She reports that she does not currently use alcohol. She reports that she does not use drugs.  Allergies:  Allergies  Allergen Reactions   Valtrex [Valacyclovir Hcl] Swelling and Rash    Swelling of face and arm, rash   Benoxinate Base     Turns eyes blood red/burning. --in eyedrops--   Iodinated Contrast Media Rash    Rash on back Other reaction(s): Unknown   Sulfa Antibiotics Rash    Medications: Scheduled: Continuous:   sodium chloride     lactated ringers      No results found for this or any previous visit (from the past 24 hour(s)).   No results found.  ROS:  As stated above in the HPI otherwise negative.  Last menstrual period 01/12/2003.    PE: Gen: NAD, Alert and Oriented HEENT:  [place]/AT, EOMI Neck: Supple, no LAD Lungs: CTA Bilaterally CV: RRR without M/G/R ABD: Soft, NTND, +BS Ext: No C/C/E  Assessment/Plan: 1) Personal history of polyp - colonoscopy.  [name] D 06/26/2021, 9:36 AM

## 2021-06-26 NOTE — Op Note (Signed)
[facility] Patient Name: [name] Procedure Date: 06/26/2021 MRN: [id number] Attending MD: Carol Ada , MD Date of Birth: 1950/11/29 CSN: [id number] Age: 71 Admit Type: Outpatient Procedure:                Colonoscopy Indications:              High risk colon cancer surveillance: Personal                            history of colonic polyps Providers:                Carol Ada, MD, Benay Pillow, RN, Benetta Spar, Technician Referring MD:              Medicines:                Propofol per Anesthesia Complications:            No immediate complications. Estimated Blood Loss:     Estimated blood loss: none. Procedure:                Pre-Anesthesia Assessment:                           - Prior to the procedure, a History and Physical                            was performed, and patient medications and                            allergies were reviewed. The patient's tolerance of                            previous anesthesia was also reviewed. The risks                            and benefits of the procedure and the sedation                            options and risks were discussed with the patient.                            All questions were answered, and informed consent                            was obtained. Prior Anticoagulants: The patient has                            taken no previous anticoagulant or antiplatelet                            agents. ASA Grade Assessment: III - A patient with                            severe systemic  disease. After reviewing the risks                            and benefits, the patient was deemed in                            satisfactory condition to undergo the procedure.                           - Sedation was administered by an anesthesia                            professional. Deep sedation was attained.                           After obtaining informed consent, the  colonoscope                            was passed under direct vision. Throughout the                            procedure, the patient's blood pressure, pulse, and                            oxygen saturations were monitored continuously. The                            CF-HQ190L ([id number]) Olympus colonoscope was                            introduced through the anus and advanced to the the                            cecum, identified by appendiceal orifice and                            ileocecal valve. The colonoscopy was performed                            without difficulty. The patient tolerated the                            procedure well. The quality of the bowel                            preparation was evaluated using the BBPS [facility]                            Bowel Preparation Scale) with scores of: Right                            Colon = 3 (entire mucosa seen well with no residual  staining, small fragments of stool or opaque                            liquid), Transverse Colon = 3 (entire mucosa seen                            well with no residual staining, small fragments of                            stool or opaque liquid) and Left Colon = 2 (minor                            amount of residual staining, small fragments of                            stool and/or opaque liquid, but mucosa seen well).                            The total BBPS score equals 8. The quality of the                            bowel preparation was good. The ileocecal valve,                            appendiceal orifice, and rectum were photographed. Scope In: 10:28:05 AM Scope Out: 10:53:49 AM Scope Withdrawal Time: 0 hours 19 minutes 48 seconds  Total Procedure Duration: 0 hours 25 minutes 44 seconds  Findings:      11 sessile polyps were found in the sigmoid colon, transverse colon,       ascending colon and cecum. The polyps were 2 to 4 mm in size. These        polyps were removed with a cold snare. Resection was complete, but the       polyp tissue was only partially retrieved.      Scattered small and large-mouthed diverticula were found in the sigmoid       colon. Impression:               - 11 2 to 4 mm polyps in the sigmoid colon, in the                            transverse colon, in the ascending colon and in the                            cecum, removed with a cold snare. Resected and                            retrieved.                           - Diverticulosis in the sigmoid colon. Moderate Sedation:      Not Applicable - Patient had care per Anesthesia. Recommendation:           - Patient has a contact number available for  emergencies. The signs and symptoms of potential                            delayed complications were discussed with the                            patient. Return to normal activities tomorrow.                            Written discharge instructions were provided to the                            patient.                           - Resume previous diet.                           - Continue present medications.                           - Await pathology results.                           - Repeat colonoscopy in 1 year for surveillance. Procedure Code(s):        --- Professional ---                           [phone], Colonoscopy, flexible; with removal of                            tumor(s), polyp(s), or other lesion(s) by snare                            technique Diagnosis Code(s):        --- Professional ---                           K63.5, Polyp of colon                           Z86.010, Personal history of colonic polyps                           K57.30, Diverticulosis of large intestine without                            perforation or abscess without bleeding CPT copyright 2019 American Medical Association. All rights reserved. The codes documented in this report are  preliminary and upon coder review may  be revised to meet current compliance requirements. Carol Ada, MD Carol Ada, MD 06/26/2021 11:05:10 AM This report has been signed electronically. Number of Addenda: 0

## 2021-06-26 NOTE — Anesthesia Postprocedure Evaluation (Signed)
Anesthesia Post Note  Patient: [name]  Procedure(s) Performed: COLONOSCOPY WITH PROPOFOL POLYPECTOMY     Patient location during evaluation: PACU Anesthesia Type: MAC Level of consciousness: awake and alert Pain management: pain level controlled Vital Signs Assessment: post-procedure vital signs reviewed and stable Respiratory status: spontaneous breathing, nonlabored ventilation, respiratory function stable and patient connected to nasal cannula oxygen Cardiovascular status: stable and blood pressure returned to baseline Postop Assessment: no apparent nausea or vomiting Anesthetic complications: no   No notable events documented.  Last Vitals:  Vitals:   06/26/21 1120 06/26/21 1125  BP: 138/68   Pulse: 73 71  Resp: 16 12  Temp:    SpO2: 99% 99%    Last Pain:  Vitals:   06/26/21 1107  TempSrc:   PainSc: 0-No pain                 Effie Berkshire

## 2021-06-26 NOTE — Discharge Instructions (Signed)

## 2021-06-26 NOTE — Anesthesia Preprocedure Evaluation (Addendum)
Anesthesia Evaluation  Patient identified by MRN, date of birth, ID band Patient awake    Reviewed: Allergy & Precautions, NPO status , Patient's Chart, lab work & pertinent test results  Airway Mallampati: II  TM Distance: >3 FB Neck ROM: Full    Dental  (+) Teeth Intact, Dental Advisory Given   Pulmonary sleep apnea ,    breath sounds clear to auscultation       Cardiovascular hypertension, Pt. on medications  Rhythm:Regular Rate:Normal     Neuro/Psych  Neuromuscular disease negative psych ROS   GI/Hepatic negative GI ROS, Neg liver ROS,   Endo/Other  negative endocrine ROS  Renal/GU negative Renal ROS     Musculoskeletal  (+) Arthritis ,   Abdominal (+) + obese,   Peds  Hematology negative hematology ROS (+)   Anesthesia Other Findings   Reproductive/Obstetrics                           Anesthesia Physical Anesthesia Plan  ASA: 3  Anesthesia Plan: MAC   Post-op Pain Management:    Induction: Intravenous  PONV Risk Score and Plan: 0 and Propofol infusion  Airway Management Planned: Natural Airway and Simple Face Mask  Additional Equipment:   Intra-op Plan:   Post-operative Plan:   Informed Consent: I have reviewed the patients History and Physical, chart, labs and discussed the procedure including the risks, benefits and alternatives for the proposed anesthesia with the patient or authorized representative who has indicated his/her understanding and acceptance.       Plan Discussed with: CRNA  Anesthesia Plan Comments:         Anesthesia Quick Evaluation

## 2021-06-26 NOTE — Transfer of Care (Signed)
Immediate Anesthesia Transfer of Care Note  Patient: [place]  Procedure(s) Performed: COLONOSCOPY WITH PROPOFOL POLYPECTOMY  Patient Location: PACU  Anesthesia Type:MAC  Level of Consciousness: sedated  Airway & Oxygen Therapy: Patient Spontanous Breathing and Patient connected to face mask oxygen  Post-op Assessment: Report given to RN and Post -op Vital signs reviewed and stable  Post vital signs: Reviewed and stable  Last Vitals:  Vitals Value Taken Time  BP    Temp    Pulse    Resp    SpO2      Last Pain:  Vitals:   06/26/21 0939  TempSrc: Tympanic  PainSc: 0-No pain         Complications: No notable events documented.

## 2021-06-29 LAB — SURGICAL PATHOLOGY

## 2021-06-30 ENCOUNTER — Encounter (HOSPITAL_COMMUNITY): Payer: Self-pay | Admitting: Gastroenterology

## 2021-07-07 ENCOUNTER — Other Ambulatory Visit (HOSPITAL_COMMUNITY): Payer: Self-pay | Admitting: Family Medicine

## 2021-07-07 ENCOUNTER — Ambulatory Visit (HOSPITAL_COMMUNITY)
Admission: RE | Admit: 2021-07-07 | Discharge: 2021-07-07 | Disposition: A | Discharge status: Home / Self Care | Payer: Medicare PPO | Source: Ambulatory Visit | Attending: Cardiology | Admitting: Cardiology

## 2021-07-07 ENCOUNTER — Telehealth: Payer: Self-pay | Admitting: Cardiovascular Disease

## 2021-07-07 DIAGNOSIS — M79662 Pain in left lower leg: Secondary | ICD-10-CM | POA: Insufficient documentation

## 2021-07-07 DIAGNOSIS — M7989 Other specified soft tissue disorders: Secondary | ICD-10-CM

## 2021-07-07 NOTE — Telephone Encounter (Signed)
Spoke with Chelle and gave advisement from Dr. Eden Emms to continue Xarelto.

## 2021-07-07 NOTE — Telephone Encounter (Signed)
Spoke with Porfirio Oar, PA-C who is calling in regards to this patient who had a doppler study done today to look for a DVT. She states that the patient has a history of a DVT and was told by Dr. Eden Emms to not wait to seek treatment if symptoms return.  The patient had a colonoscopy about 2 weeks ago. She recently started to have pain and heaviness in her left leg similar to pain that she had with her prior DVT. An ultrasound was done today showing a non-occlusive DVT. They are unable to discern whether it is old or new. The patient is currently on xarelto. Chelle is looking for any further advisement. Spoke with Dr. Mayford Knife who is requesting that the patient's primary cardiologist, Dr. Eden Emms weigh in on this.  She states that she can be called back at [phone]

## 2021-08-18 ENCOUNTER — Telehealth: Payer: Self-pay | Admitting: Family Medicine

## 2021-08-18 NOTE — Telephone Encounter (Signed)
..   Pt understands that although there may be some limitations with this type of visit, we will take all precautions to reduce any security or privacy concerns.  Pt understands that this will be treated like an in office visit and we will file with pt's insurance, and there may be a patient responsible charge related to this service. ? ?

## 2021-08-26 ENCOUNTER — Ambulatory Visit: Payer: Medicare PPO | Admitting: Cardiovascular Disease

## 2021-09-07 NOTE — Progress Notes (Unsigned)
PATIENT: [name] DOB: 1950/09/02  REASON FOR VISIT: follow up HISTORY FROM: patient  Virtual Visit via Telephone Note  I connected with [place] on 09/08/21 at  8:45 AM EDT by telephone and verified that I am speaking with the correct person using two identifiers.   I discussed the limitations, risks, security and privacy concerns of performing an evaluation and management service by telephone and the availability of in person appointments. I also discussed with the patient that there may be a patient responsible charge related to this service. The patient expressed understanding and agreed to proceed.   History of Present Illness:  09/08/21 ALL ([place]) [name] returns for follow up for OSA on CPAP. She continues to do well. She is using her CPAP nightly for about 7 hours. She has noted her events seem a little higher than normal but has been taking an antihistamine. Average AHI around 2.     07/31/2020 ALL ([place]) [place] A Gregg is a 71 y.o. female here today for follow up for OSA on CPAP. She continues to do well on therapy. She is using CPAP nightly. 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 She feels that she is doing fairly well. She is planning to have the right hip replaced in September. She has had some difficulty communicating with DME but feels she has gotten her issues resolved.     History (copied from Dr Dohmeier's previous note)   HPI:  [place] A Boys is a 71 y.o. female  here for CPAP compliance, seen in a virtual visit on 01/15/19  Mrs. Stobaugh has rented a new CPAP machine and 2 days compliance visit is also needed to allow her to purchase her machine all the right.  She is now using an air sense auto sense with 8 cm water pressure to centimeter EPR and a residual AHI of only 1.3/h.  She has been 100% compliant with an average use at time of 7 hours 28 minutes.  Her risk factors for Covid are increased as she is 71 years old,  her BMI is over 35, and she has obstructive sleep apnea with a significant amount of sleep hypoxemia.  We discussed today that she should go through the website of the [facility] and register for vaccine, as she is likely in group 2 a or 2 be.   She informed me that she has a new health insurance and new [employer] plan and wants to make sure that our visits and her CPAP supplies are properly billed.     Observations/Objective:  Generalized: Well developed, in no acute distress  Mentation: Alert oriented to time, place, history taking. Follows all commands speech and language fluent   Assessment and Plan:  71 y.o. year old female  has a past medical history of Anemia, Arthritis, Basal cell carcinoma of nose, Bronchitis, Bursitis of left hip, Cancer ([place]), Cancer of ovary ([place]) (2005), Carpal tunnel syndrome, Cataract, Degenerative joint disease of both hips (2019), Diverticulitis, DVT (deep vein thrombosis) in pregnancy, Family history of breast cancer, Femoral DVT (deep venous thrombosis) ([place]) (2008), FH: genetic disease carrier (11/05/2016), Genetic testing (11/04/2016), Glaucoma, History of ovarian cancer, HTN (hypertension), Hyperlipidemia, Hypertension, OSA (obstructive sleep apnea), Osteopenia (2019), Pneumonia, Shingles, and Sleep apnea. here with    ICD-10-CM   1. OSA on CPAP  G47.33 For home use only DME continuous positive airway pressure (CPAP)   Z99.89  [name] is doing very well on CPAP. She was encouraged to continue CPAP nightly and greater than 4 hours each night. She will continue close follow up with PCP and return to see [place] in 1 year. She verbalizes understanding and agreement with this plan.   Orders Placed This Encounter  Procedures   For home use only DME continuous positive airway pressure (CPAP)    Supplies    Order Specific Question:   Length of Need    Answer:   [employer]    Order Specific Question:   Patient has  OSA or probable OSA    Answer:   Yes    Order Specific Question:   Is the patient currently using CPAP in the home    Answer:   Yes    Order Specific Question:   Settings    Answer:   Other see comments    Order Specific Question:   CPAP supplies needed    Answer:   Mask, headgear, cushions, filters, heated tubing and water chamber     No orders of the defined types were placed in this encounter.     Follow Up Instructions:  I discussed the assessment and treatment plan with the patient. The patient was provided an opportunity to ask questions and all were answered. The patient agreed with the plan and demonstrated an understanding of the instructions.   The patient was advised to call back or seek an in-person evaluation if the symptoms worsen or if the condition fails to improve as anticipated.  I provided 15 minutes of non-face-to-face time during this encounter. Patient located at their place of residence during [place] visit. Provider is in the office.    Debbora Presto, NP

## 2021-09-07 NOTE — Patient Instructions (Incomplete)
Please continue using your CPAP regularly. While your insurance requires that you use CPAP at least 4 hours each night on 70% of the nights, I recommend, that you not skip any nights and use it throughout the night if you can. Getting used to CPAP and staying with the treatment long term does take time and patience and discipline. Untreated obstructive sleep apnea when it is moderate to severe can have an adverse impact on cardiovascular health and raise her risk for heart disease, arrhythmias, hypertension, congestive heart failure, stroke and diabetes. Untreated obstructive sleep apnea causes sleep disruption, nonrestorative sleep, and sleep deprivation. This can have an impact on your day to day functioning and cause daytime sleepiness and impairment of cognitive function, memory loss, mood disturbance, and problems focussing. Using CPAP regularly can improve these symptoms.   You can call to schedule 1 year follow up

## 2021-09-08 ENCOUNTER — Telehealth (INDEPENDENT_AMBULATORY_CARE_PROVIDER_SITE_OTHER): Payer: Medicare PPO | Admitting: Family Medicine

## 2021-09-08 ENCOUNTER — Encounter: Payer: Self-pay | Admitting: Family Medicine

## 2021-09-08 DIAGNOSIS — G4733 Obstructive sleep apnea (adult) (pediatric): Secondary | ICD-10-CM

## 2021-09-08 DIAGNOSIS — Z9989 Dependence on other enabling machines and devices: Secondary | ICD-10-CM

## 2022-09-02 ENCOUNTER — Other Ambulatory Visit: Payer: Self-pay | Admitting: Gastroenterology

## 2022-09-23 ENCOUNTER — Encounter (HOSPITAL_COMMUNITY): Payer: Self-pay | Admitting: Gastroenterology

## 2022-09-30 ENCOUNTER — Encounter (HOSPITAL_COMMUNITY): Payer: Self-pay | Admitting: Gastroenterology

## 2022-10-01 ENCOUNTER — Ambulatory Visit (HOSPITAL_COMMUNITY)
Admission: RE | Admit: 2022-10-01 | Discharge: 2022-10-01 | Disposition: A | Discharge status: Home / Self Care | Payer: Medicare PPO | Attending: Gastroenterology | Admitting: Gastroenterology

## 2022-10-01 ENCOUNTER — Encounter (HOSPITAL_COMMUNITY): Payer: Self-pay | Admitting: Gastroenterology

## 2022-10-01 ENCOUNTER — Ambulatory Visit (HOSPITAL_COMMUNITY): Payer: Medicare PPO | Admitting: Anesthesiology

## 2022-10-01 ENCOUNTER — Encounter (HOSPITAL_COMMUNITY)
Admission: RE | Disposition: A | Discharge status: Home / Self Care | Payer: Self-pay | Source: Home / Self Care | Attending: Gastroenterology

## 2022-10-01 ENCOUNTER — Other Ambulatory Visit: Payer: Self-pay

## 2022-10-01 DIAGNOSIS — I10 Essential (primary) hypertension: Secondary | ICD-10-CM

## 2022-10-01 DIAGNOSIS — Z800 Family history of malignant neoplasm of digestive organs: Secondary | ICD-10-CM | POA: Insufficient documentation

## 2022-10-01 DIAGNOSIS — Z1211 Encounter for screening for malignant neoplasm of colon: Secondary | ICD-10-CM | POA: Diagnosis present

## 2022-10-01 DIAGNOSIS — Z79899 Other long term (current) drug therapy: Secondary | ICD-10-CM | POA: Diagnosis not present

## 2022-10-01 DIAGNOSIS — D122 Benign neoplasm of ascending colon: Secondary | ICD-10-CM | POA: Diagnosis not present

## 2022-10-01 DIAGNOSIS — G4730 Sleep apnea, unspecified: Secondary | ICD-10-CM | POA: Insufficient documentation

## 2022-10-01 DIAGNOSIS — Z86718 Personal history of other venous thrombosis and embolism: Secondary | ICD-10-CM | POA: Diagnosis not present

## 2022-10-01 DIAGNOSIS — D123 Benign neoplasm of transverse colon: Secondary | ICD-10-CM | POA: Diagnosis not present

## 2022-10-01 DIAGNOSIS — Z6841 Body Mass Index (BMI) 40.0 and over, adult: Secondary | ICD-10-CM | POA: Insufficient documentation

## 2022-10-01 DIAGNOSIS — K5730 Diverticulosis of large intestine without perforation or abscess without bleeding: Secondary | ICD-10-CM | POA: Insufficient documentation

## 2022-10-01 DIAGNOSIS — K635 Polyp of colon: Secondary | ICD-10-CM

## 2022-10-01 HISTORY — PX: COLONOSCOPY WITH PROPOFOL: SHX5780

## 2022-10-01 HISTORY — PX: POLYPECTOMY: SHX5525

## 2022-10-01 SURGERY — COLONOSCOPY WITH PROPOFOL
Anesthesia: Monitor Anesthesia Care

## 2022-10-01 MED ORDER — LIDOCAINE HCL (CARDIAC) PF 100 MG/5ML IV SOSY
PREFILLED_SYRINGE | INTRAVENOUS | Status: DC | PRN
Start: 2022-10-01 — End: 2022-10-01
  Administered 2022-10-01: 50 mg via INTRAVENOUS

## 2022-10-01 MED ORDER — SODIUM CHLORIDE 0.9 % IV SOLN: INTRAVENOUS | Status: DC

## 2022-10-01 MED ORDER — ONDANSETRON HCL 4 MG/2ML IJ SOLN
INTRAMUSCULAR | Status: DC | PRN
  Administered 2022-10-01: 4 mg via INTRAVENOUS

## 2022-10-01 MED ORDER — PROPOFOL 500 MG/50ML IV EMUL
INTRAVENOUS | Status: DC | PRN
  Administered 2022-10-01: 125 ug/kg/min via INTRAVENOUS
  Administered 2022-10-01: 20 mg via INTRAVENOUS

## 2022-10-01 MED ORDER — LACTATED RINGERS IV SOLN
INTRAVENOUS | Status: AC | PRN
  Administered 2022-10-01: 1000 mL via INTRAVENOUS

## 2022-10-01 SURGICAL SUPPLY — 22 items

## 2022-10-01 NOTE — Op Note (Addendum)
[facility] Patient Name: Stony Ridge Procedure Date: 10/01/2022 MRN: [id number] Attending MD: Jeani Hawking , MD, [id number] Date of Birth: Mar 21, 1950 CSN: [id number] Age: 72 Admit Type: Outpatient Procedure:                Colonoscopy Indications:              High risk colon cancer surveillance: Personal                            history of colonic polyps Providers:                Jeani Hawking, MD, Stephens Shire RN, RN, Jacquelyn                            "Jaci" Clelia Croft, RN, Kandice Robinsons, Technician Referring MD:             Jeani Hawking, MD Medicines:                Propofol per Anesthesia Complications:            No immediate complications. Estimated Blood Loss:     Estimated blood loss: none. Procedure:                Pre-Anesthesia Assessment:                           - Prior to the procedure, a History and Physical                            was performed, and patient medications and                            allergies were reviewed. The patient's tolerance of                            previous anesthesia was also reviewed. The risks                            and benefits of the procedure and the sedation                            options and risks were discussed with the patient.                            All questions were answered, and informed consent                            was obtained. Prior Anticoagulants: The patient has                            taken no anticoagulant or antiplatelet agents. ASA                            Grade Assessment: III - A patient with severe  systemic disease. After reviewing the risks and                            benefits, the patient was deemed in satisfactory                            condition to undergo the procedure.                           - Sedation was administered by an anesthesia                            professional. Deep sedation was attained.                            After obtaining informed consent, the colonoscope                            was passed under direct vision. Throughout the                            procedure, the patient's blood pressure, pulse, and                            oxygen saturations were monitored continuously. The                            CF-HQ190L ([id number]) Olympus colonoscope was                            introduced through the anus and advanced to the the                            cecum, identified by appendiceal orifice and                            ileocecal valve. The colonoscopy was performed                            without difficulty. The patient tolerated the                            procedure well. The quality of the bowel                            preparation was evaluated using the BBPS [facility]                            Bowel Preparation Scale) with scores of: Right                            Colon = 3 (entire mucosa seen well with no residual  staining, small fragments of stool or opaque                            liquid), Transverse Colon = 3 (entire mucosa seen                            well with no residual staining, small fragments of                            stool or opaque liquid) and Left Colon = 2 (minor                            amount of residual staining, small fragments of                            stool and/or opaque liquid, but mucosa seen well).                            The total BBPS score equals 8. The quality of the                            bowel preparation was good. The ileocecal valve,                            appendiceal orifice, and rectum were photographed. Scope In: 7:18:46 AM Scope Out: 7:36:33 AM Scope Withdrawal Time: 0 hours 14 minutes 55 seconds  Total Procedure Duration: 0 hours 17 minutes 47 seconds  Findings:      Five sessile polyps were found in the transverse colon and ascending       colon. The polyps were 3 to 4 mm in  size. These polyps were removed with       a cold snare. Resection and retrieval were complete.      Scattered large-mouthed, medium-mouthed and small-mouthed diverticula       were found in the sigmoid colon, descending colon and transverse colon. Impression:               - Five 3 to 4 mm polyps in the transverse colon and                            in the ascending colon, removed with a cold snare.                            Resected and retrieved.                           - Diverticulosis in the sigmoid colon, in the                            descending colon and in the transverse colon. Moderate Sedation:      Not Applicable - Patient had care per Anesthesia. Recommendation:           - Patient has a contact number available for  emergencies. The signs and symptoms of potential                            delayed complications were discussed with the                            patient. Return to normal activities tomorrow.                            Written discharge instructions were provided to the                            patient.                           - Resume previous diet.                           - Continue present medications.                           - Await pathology results.                           - Repeat colonoscopy in 3 years for surveillance,                            if clinically appropriate. Procedure Code(s):        --- Professional ---                           [phone], Colonoscopy, flexible; with removal of                            tumor(s), polyp(s), or other lesion(s) by snare                            technique Diagnosis Code(s):        --- Professional ---                           D12.3, Benign neoplasm of transverse colon (hepatic                            flexure or splenic flexure)                           Z86.010, Personal history of colonic polyps                           D12.2, Benign neoplasm of ascending  colon                           K57.30, Diverticulosis of large intestine without                            perforation or abscess without bleeding  CPT copyright 2022 [employer]. All rights reserved. The codes documented in this report are preliminary and upon coder review may  be revised to meet current compliance requirements. Jeani Hawking, MD Jeani Hawking, MD 10/01/2022 7:46:55 AM This report has been signed electronically. Number of Addenda: 0

## 2022-10-01 NOTE — H&P (Signed)
[name] HPI: This 72 year old white female presents to the office for a 1 year colon cancer screening. Her last colonoscopy was done on 06/16/2021 which revealed diverticulosis in the sigmoid colon, a hyperplastic polyp was removed from the transverse colon, sessile serrated polyps were removed from the ascending colon and cecum and a tubular adenoma was removed from the descending colon. She has 1 BM per day with no obvious blood or mucus in the stool. She has good appetite and has gained 20 pounds over the last year. She denies any complaints of abdominal pain, nausea, vomiting, acid reflux, dysphagia or odynophagia. She denies having a family history of celiac sprue or IBD. Her father was diagnosed with colon cancer in his 68's.   Past Medical History:  Diagnosis Date   Anemia    oral iron occ.   Arthritis    Basal cell carcinoma of nose    Bronchitis    Bursitis of left hip    Cancer (HCC)    [employer] 12/19/2019   Cancer of ovary (HCC) 2005   Stage 1A, BRCA 1/2 neg 6/14   Carpal tunnel syndrome    Right hand   Cataract    [employer] 12/19/2019   Degenerative joint disease of both hips 2019   Diverticulitis    past history   DVT (deep vein thrombosis) in pregnancy    LEFT LEG   Family history of breast cancer    Femoral DVT (deep venous thrombosis) (HCC) 2008   -"wears compression hose all times" left leg   FH: genetic disease carrier 11/05/2016   Genetic testing 11/04/2016   Multi-Cancer panel (83 genes) @ [facility] - see report; special interpretation   Glaucoma    [employer] 12/19/2019   History of ovarian cancer    HTN (hypertension)    Hyperlipidemia    Hypertension    [employer] 12/19/2019   OSA (obstructive sleep apnea)    hypoventilation, on CPAP   Osteopenia 2019   Pneumonia    Shingles    Sleep apnea    [employer] 12/19/2019    Past Surgical History:  Procedure Laterality Date   ABDOMINAL HYSTERECTOMY     BASAL CELL CARCINOMA EXCISION     resection with  plastic surgery reconstrustion   BREAST LUMPECTOMY Left 1970   benign   CATARACT EXTRACTION Bilateral 2002, 2005   with bilateral lens replacements   COLONOSCOPY WITH PROPOFOL N/A 05/21/2014   Procedure: COLONOSCOPY WITH PROPOFOL;  Surgeon: Charna Elizabeth, MD;  Location: [facility];  Service: Endoscopy;  Laterality: N/A;   COLONOSCOPY WITH PROPOFOL N/A 06/26/2021   Procedure: COLONOSCOPY WITH PROPOFOL;  Surgeon: Jeani Hawking, MD;  Location: [facility];  Service: Gastroenterology;  Laterality: N/A;   EYE SURGERY     HERNIA REPAIR  2008   umbilical repair with mesh    POLYPECTOMY  06/26/2021   Procedure: POLYPECTOMY;  Surgeon: Jeani Hawking, MD;  Location: [facility];  Service: Gastroenterology;;   TOTAL ABDOMINAL HYSTERECTOMY W/ BILATERAL SALPINGOOPHORECTOMY  2005   Stage IA ovarian cancer   TOTAL HIP ARTHROPLASTY Left 06/25/2020   Procedure: TOTAL HIP ARTHROPLASTY ANTERIOR APPROACH;  Surgeon: Ollen Gross, MD;  Location: [facility];  Service: Orthopedics;  Laterality: Left;    TOTAL HIP ARTHROPLASTY Right 10/01/2020   Procedure: TOTAL HIP ARTHROPLASTY ANTERIOR APPROACH;  Surgeon: Ollen Gross, MD;  Location: [facility];  Service: Orthopedics;  Laterality: Right;    Family History  Problem Relation Age of Onset   Alzheimer's disease Mother  Hypertension Mother    Alzheimer's disease Father    Hypertension Father    Colon cancer Father 45       deceased 60   Hypertension Sister    Breast cancer Sister 65   Parkinson's disease Sister    Hypertension Brother    Prostate cancer Brother 63   Prostate cancer Other        father's maternal half-brother   Breast cancer Other 22       daughter of brother; currently 64    Social History:  reports that she has never smoked. She has never used smokeless tobacco. She reports that she does not currently use alcohol. She reports that she does not use drugs.  Allergies:  Allergies  Allergen Reactions   Valtrex [Valacyclovir Hcl]  Swelling and Rash    Swelling of face and arm, rash   Benoxinate Base     Turns eyes blood red/burning. --in eyedrops--   Iodinated Contrast Media Rash    Rash on back Other reaction(s): Unknown   Sulfa Antibiotics Rash    Medications: Scheduled: Continuous:  sodium chloride     lactated ringers 1,000 mL (10/01/22 0655)    No results found for this or any previous visit (from the past 24 hour(s)).   No results found.  ROS:  As stated above in the HPI otherwise negative.  Blood pressure (!) 174/69, pulse 82, temperature (!) 97.4 F (36.3 C), temperature source Tympanic, resp. rate 20, height 5\' 7"  (1.702 m), weight 134 kg, last menstrual period 01/12/2003, SpO2 95%.    PE: Gen: NAD, Alert and Oriented HEENT:  [place]/AT, EOMI Neck: Supple, no LAD Lungs: CTA Bilaterally CV: RRR without M/G/R ABD: Soft, NTND, +BS Ext: No C/C/E  Assessment/Plan: 1) Personal history of polyps - colonoscopy.  Quaran Kedzierski D 10/01/2022, 7:08 AM

## 2022-10-01 NOTE — Anesthesia Preprocedure Evaluation (Signed)
Anesthesia Evaluation  Patient identified by MRN, date of birth, ID band Patient awake    Reviewed: Allergy & Precautions, NPO status , Patient's Chart, lab work & pertinent test results  Airway Mallampati: II  TM Distance: >3 FB Neck ROM: Full    Dental  (+) Dental Advisory Given   Pulmonary sleep apnea and Continuous Positive Airway Pressure Ventilation    Pulmonary exam normal        Cardiovascular hypertension, Pt. on medications + DVT   Rhythm:Regular Rate:Normal     Neuro/Psych negative neurological ROS     GI/Hepatic negative GI ROS, Neg liver ROS,,,  Endo/Other    Morbid obesity  Renal/GU negative Renal ROS     Musculoskeletal  (+) Arthritis ,    Abdominal   Peds  Hematology  (+) Blood dyscrasia, anemia   Anesthesia Other Findings   Reproductive/Obstetrics                             Anesthesia Physical Anesthesia Plan  ASA: 3  Anesthesia Plan: MAC   Post-op Pain Management:    Induction:   PONV Risk Score and Plan: 2 and Propofol infusion and Ondansetron  Airway Management Planned: Natural Airway and Simple Face Mask  Additional Equipment:   Intra-op Plan:   Post-operative Plan:   Informed Consent: I have reviewed the patients History and Physical, chart, labs and discussed the procedure including the risks, benefits and alternatives for the proposed anesthesia with the patient or authorized representative who has indicated his/her understanding and acceptance.       Plan Discussed with: CRNA  Anesthesia Plan Comments:        Anesthesia Quick Evaluation

## 2022-10-01 NOTE — Anesthesia Postprocedure Evaluation (Signed)
Anesthesia Post Note  Patient: [name]  Procedure(s) Performed: COLONOSCOPY WITH PROPOFOL POLYPECTOMY     Patient location during evaluation: PACU Anesthesia Type: MAC Level of consciousness: awake and alert Pain management: pain level controlled Vital Signs Assessment: post-procedure vital signs reviewed and stable Respiratory status: spontaneous breathing, nonlabored ventilation, respiratory function stable and patient connected to nasal cannula oxygen Cardiovascular status: stable and blood pressure returned to baseline Postop Assessment: no apparent nausea or vomiting Anesthetic complications: no  No notable events documented.  Last Vitals:  Vitals:   10/01/22 0800 10/01/22 0810  BP: 132/68 (!) 161/78  Pulse: 77 80  Resp: 17 19  Temp:    SpO2: 99% 97%    Last Pain:  Vitals:   10/01/22 0810  TempSrc:   PainSc: 0-No pain                 Kennieth Rad

## 2022-10-01 NOTE — Discharge Instructions (Signed)
YOU HAD AN ENDOSCOPIC PROCEDURE TODAY: Refer to the procedure report and other information in the discharge instructions given to you for any specific questions about what was found during the examination. If this information does not answer your questions, please call [facility] at [phone] to clarify.   YOU SHOULD EXPECT: Some feelings of bloating in the abdomen. Passage of more gas than usual. Walking can help get rid of the air that was put into your GI tract during the procedure and reduce the bloating. If you had a lower endoscopy (such as a colonoscopy or flexible sigmoidoscopy) you may notice spotting of blood in your stool or on the toilet paper. Some abdominal soreness may be present for a day or two, also.  DIET: Your first meal following the procedure should be a light meal and then it is ok to progress to your normal diet. A half-sandwich or bowl of soup is an example of a good first meal. Heavy or fried foods are harder to digest and may make you feel nauseous or bloated. Drink plenty of fluids but you should avoid alcoholic beverages for 24 hours. If you had an esophageal dilation, please see attached information for diet.   ACTIVITY: Your care partner should take you home directly after the procedure. You should plan to take it easy, moving slowly for the rest of the day. You can resume normal activity the day after the procedure however YOU SHOULD NOT DRIVE, use power tools, machinery or perform tasks that involve climbing or major physical exertion for 24 hours (because of the sedation medicines used during the test).   SYMPTOMS TO REPORT IMMEDIATELY: A gastroenterologist can be reached at any hour. Please call [phone]  for any of the following symptoms:  Following lower endoscopy (colonoscopy, flexible sigmoidoscopy) Excessive amounts of blood in the stool  Significant tenderness, worsening of abdominal pains  Swelling of the abdomen that is new, acute  Fever of  100 or higher  Following upper endoscopy (EGD, EUS, ERCP, esophageal dilation) Vomiting of blood or coffee ground material  New, significant abdominal pain  New, significant chest pain or pain under the shoulder blades  Painful or persistently difficult swallowing  New shortness of breath  Black, tarry-looking or red, bloody stools  FOLLOW UP:  If any biopsies were taken you will be contacted by phone or by letter within the next 1-3 weeks. Call [phone]  if you have not heard about the biopsies in 3 weeks.  Please also call with any specific questions about appointments or follow up tests. YOU HAD AN ENDOSCOPIC PROCEDURE TODAY: Refer to the procedure report and other information in the discharge instructions given to you for any specific questions about what was found during the examination. If this information does not answer your questions, please call [facility] at [phone] to clarify.   YOU SHOULD EXPECT: Some feelings of bloating in the abdomen. Passage of more gas than usual. Walking can help get rid of the air that was put into your GI tract during the procedure and reduce the bloating. If you had a lower endoscopy (such as a colonoscopy or flexible sigmoidoscopy) you may notice spotting of blood in your stool or on the toilet paper. Some abdominal soreness may be present for a day or two, also.  DIET: Your first meal following the procedure should be a light meal and then it is ok to progress to your normal diet. A half-sandwich or bowl of soup is an example  of a good first meal. Heavy or fried foods are harder to digest and may make you feel nauseous or bloated. Drink plenty of fluids but you should avoid alcoholic beverages for 24 hours. If you had an esophageal dilation, please see attached information for diet.   ACTIVITY: Your care partner should take you home directly after the procedure. You should plan to take it easy, moving slowly for the rest of the day. You  can resume normal activity the day after the procedure however YOU SHOULD NOT DRIVE, use power tools, machinery or perform tasks that involve climbing or major physical exertion for 24 hours (because of the sedation medicines used during the test).   SYMPTOMS TO REPORT IMMEDIATELY: A gastroenterologist can be reached at any hour. Please call [phone]  for any of the following symptoms:  Following lower endoscopy (colonoscopy, flexible sigmoidoscopy) Excessive amounts of blood in the stool  Significant tenderness, worsening of abdominal pains  Swelling of the abdomen that is new, acute  Fever of 100 or higher  Following upper endoscopy (EGD, EUS, ERCP, esophageal dilation) Vomiting of blood or coffee ground material  New, significant abdominal pain  New, significant chest pain or pain under the shoulder blades  Painful or persistently difficult swallowing  New shortness of breath  Black, tarry-looking or red, bloody stools  FOLLOW UP:  If any biopsies were taken you will be contacted by phone or by letter within the next 1-3 weeks. Call [phone]  if you have not heard about the biopsies in 3 weeks.  Please also call with any specific questions about appointments or follow up tests.

## 2022-10-01 NOTE — Transfer of Care (Signed)
Immediate Anesthesia Transfer of Care Note  Patient: [name]  Procedure(s) Performed: Procedure(s): COLONOSCOPY WITH PROPOFOL (N/A) POLYPECTOMY  Patient Location: PACU  Anesthesia Type:MAC  Level of Consciousness:  sedated, patient cooperative and responds to stimulation  Airway & Oxygen Therapy:Patient Spontanous Breathing and Patient connected to face mask oxgen  Post-op Assessment:  Report given to PACU RN and Post -op Vital signs reviewed and stable  Post vital signs:  Reviewed and stable  Last Vitals:  Vitals:   10/01/22 0645  BP: (!) 174/69  Pulse: 82  Resp: 20  Temp: (!) 36.3 C  SpO2: 95%    Complications: No apparent anesthesia complications

## 2022-10-03 ENCOUNTER — Encounter (HOSPITAL_COMMUNITY): Payer: Self-pay | Admitting: Gastroenterology

## 2022-10-04 LAB — SURGICAL PATHOLOGY

## 2023-03-08 NOTE — Patient Instructions (Incomplete)

## 2023-03-08 NOTE — Progress Notes (Unsigned)
 PATIENT: [name] DOB: [birth year]  REASON FOR VISIT: follow up HISTORY FROM: patient  No chief complaint on file.    HISTORY OF PRESENT ILLNESS:  03/08/23 ALL:  [name] is a 73 y.o. female here today for follow up for OSA on CPAP.    09/08/21 ALL (Mychart) [name] returns for follow up for OSA on CPAP. She continues to do well. She is using her CPAP nightly for about 7 hours. She has noted her events seem a little higher than normal but has been taking an antihistamine. Average AHI around 2.       07/31/2020 ALL (Mychart) [name] is a 73 y.o. female here today for follow up for OSA on CPAP. She continues to do well on therapy. She is using CPAP nightly. 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 She feels that she is doing fairly well. She is planning to have the right hip replaced in September. She has had some difficulty communicating with DME but feels she has gotten her issues resolved.         REVIEW OF SYSTEMS: Out of a complete 14 system review of symptoms, the patient complains only of the following symptoms, and all other reviewed systems are negative.  ESS:  ALLERGIES: Allergies  Allergen Reactions   Valtrex [Valacyclovir Hcl] Swelling and Rash    Swelling of face and arm, rash   Benoxinate Base     Turns eyes blood red/burning. --in eyedrops--   Iodinated Contrast Media Rash    Rash on back Other reaction(s): Unknown   Sulfa Antibiotics Rash    HOME MEDICATIONS: Outpatient Medications Prior to Visit  Medication Sig Dispense Refill   atorvastatin (LIPITOR) 40 MG tablet Take 1 tablet (40 mg total) by mouth daily. 90 tablet 3   Cholecalciferol (VITAMIN D) 50 MCG (2000 UT) tablet Take 2,000 Units by mouth daily. TAKES EVERY EVE     DENTA 5000 PLUS 1.1 % CREA dental cream Place 1 application  onto teeth at bedtime.     ferrous sulfate 325 (65 FE) MG tablet Take 325 mg by mouth daily with breakfast.     fish  oil-omega-3 fatty acids 1000 MG capsule Take 1 g by mouth 2 (two) times daily.     fluticasone (FLONASE) 50 MCG/ACT nasal spray Place 1 spray into both nostrils 2 (two) times daily. (Patient taking differently: Place 2 sprays into both nostrils daily.) 48 g 3   furosemide (LASIX) 20 MG tablet Take 1 tablet (20 mg total) by mouth daily. 90 tablet 3   lisinopril (ZESTRIL) 40 MG tablet Take 1 tablet (40 mg total) by mouth daily. 90 tablet 3   loratadine (CLARITIN) 10 MG tablet Take 10 mg by mouth daily.     Multiple Vitamin (MULTIVITAMIN) tablet Take 1 tablet by mouth every morning.     niacin 500 MG tablet Take 1 tablet (500 mg total) by mouth every evening. 90 tablet 3   rivaroxaban (XARELTO) 20 MG TABS tablet Take 1 tablet (20 mg total) by mouth daily. 90 tablet 1   No facility-administered medications prior to visit.    PAST MEDICAL HISTORY: Past Medical History:  Diagnosis Date   Anemia    oral iron occ.   Arthritis    Basal cell carcinoma of nose    Bronchitis    Bursitis of left hip    Cancer (HCC)    Phreesia 12/19/2019   Cancer of ovary (HCC) 2005  Stage 1A, BRCA 1/2 neg 6/14   Carpal tunnel syndrome    Right hand   Cataract    Phreesia 12/19/2019   Degenerative joint disease of both hips 2019   Diverticulitis    past history   DVT (deep vein thrombosis) in pregnancy    LEFT LEG   Family history of breast cancer    Femoral DVT (deep venous thrombosis) (HCC) 2008   -"wears compression hose all times" left leg   FH: genetic disease carrier 11/05/2016   Genetic testing 11/04/2016   Multi-Cancer panel (83 genes) @ [facility] - see report; special interpretation   Glaucoma    Phreesia 12/19/2019   History of ovarian cancer    HTN (hypertension)    Hyperlipidemia    Hypertension    Phreesia 12/19/2019   OSA (obstructive sleep apnea)    hypoventilation, on CPAP   Osteopenia 2019   Pneumonia    Shingles    Sleep apnea    [facility]reesia 12/19/2019    PAST SURGICAL  HISTORY: Past Surgical History:  Procedure Laterality Date   ABDOMINAL HYSTERECTOMY     BASAL CELL CARCINOMA EXCISION     resection with plastic surgery reconstrustion   BREAST LUMPECTOMY Left 1970   benign   CATARACT EXTRACTION Bilateral 2002, 2005   with bilateral lens replacements   COLONOSCOPY WITH PROPOFOL N/A 05/21/2014   Procedure: COLONOSCOPY WITH PROPOFOL;  Surgeon: Charna Elizabeth, MD;  Location: [facility];  Service: Endoscopy;  Laterality: N/A;   COLONOSCOPY WITH PROPOFOL N/A 06/26/2021   Procedure: COLONOSCOPY WITH PROPOFOL;  Surgeon: Jeani Hawking, MD;  Location: [facility];  Service: Gastroenterology;  Laterality: N/A;   COLONOSCOPY WITH PROPOFOL N/A 10/01/2022   Procedure: COLONOSCOPY WITH PROPOFOL;  Surgeon: Jeani Hawking, MD;  Location: [facility];  Service: Gastroenterology;  Laterality: N/A;   EYE SURGERY     HERNIA REPAIR  2008   umbilical repair with mesh    POLYPECTOMY  06/26/2021   Procedure: POLYPECTOMY;  Surgeon: Jeani Hawking, MD;  Location: [facility];  Service: Gastroenterology;;   POLYPECTOMY  10/01/2022   Procedure: POLYPECTOMY;  Surgeon: Jeani Hawking, MD;  Location: [facility];  Service: Gastroenterology;;   TOTAL ABDOMINAL HYSTERECTOMY W/ BILATERAL SALPINGOOPHORECTOMY  2005   Stage IA ovarian cancer   TOTAL HIP ARTHROPLASTY Left 06/25/2020   Procedure: TOTAL HIP ARTHROPLASTY ANTERIOR APPROACH;  Surgeon: Ollen Gross, MD;  Location: [facility];  Service: Orthopedics;  Laterality: Left;    TOTAL HIP ARTHROPLASTY Right 10/01/2020   Procedure: TOTAL HIP ARTHROPLASTY ANTERIOR APPROACH;  Surgeon: Ollen Gross, MD;  Location: [facility];  Service: Orthopedics;  Laterality: Right;    FAMILY HISTORY: Family History  Problem Relation Age of Onset   Alzheimer's disease Mother    Hypertension Mother    Alzheimer's disease Father    Hypertension Father    Colon cancer Father 51       deceased 53   Hypertension Sister    Breast cancer Sister 22    Parkinson's disease Sister    Hypertension Brother    Prostate cancer Brother 49   Prostate cancer Other        father's maternal half-brother   Breast cancer Other 42       daughter of brother; currently 10    SOCIAL HISTORY: Social History   Socioeconomic History   Marital status: Single    Spouse name: Not on file   Number of children: 0   Years of education: masters   Highest education  level: Master's degree (e.g., MA, MS, MEng, MEd, MSW, MBA)  Occupational History   Occupation: Engineer, maintenance (IT): [employer]    Comment: fifth grade (retired)  Tobacco Use   Smoking status: Never   Smokeless tobacco: Never   Tobacco comments:    only for 6 months in the 70s  Vaping Use   Vaping status: Never Used  Substance and Sexual Activity   Alcohol use: Not Currently    Alcohol/week: 0.0 standard drinks of alcohol   Drug use: Never   Sexual activity: Not Currently    Birth control/protection: Surgical    Comment: pt strait   Other Topics Concern   Not on file  Social History Narrative   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 PSH showed an AHI of 120!!!! titrated to 8 cm watr cPAP , residual AHI of 1 and downlaod was done  01-23-11 .  excellent compliance - 7 hours and low leak,  nasal pillow mask.    BMI is considered morbidly obese.  discussed low carb  diet - weight watchers and restricted exercise. , aquatic .    Patient cannot exercise due to soreness in proximal muscles.and  would like to add coenzyme q 10 and carnitine . She needs a medical weight loss regimen - bariatric surgery?       FSS 48, Epworth 4 again ,  CMS compliant  residual AHI 1.1 and average use 6.48  hours , no naps    Patient is single and lives alone.   Patient does not have any children.   Patient is retired.   Patient has a Master's degree.   Patient is left-handed.   Patient drinks tea occasionally.             Social Drivers of Corporate investment banker  Strain: Low Risk  (02/20/2023)   Received from [facility]   Overall Financial Resource Strain (CARDIA)    Difficulty of Paying Living Expenses: Not hard at all  Food Insecurity: No Food Insecurity (02/20/2023)   [facility] from [facility]   Hunger Vital Sign    Worried About Running Out of Food in the Last Year: Never true    Ran Out of Food in the Last Year: Never true  Transportation Needs: No Transportation Needs (02/20/2023)   Received from [facility] - Transportation    Lack of Transportation (Medical): No    Lack of Transportation (Non-Medical): No  Physical Activity: Insufficiently Active (02/20/2023)   Received from [facility]   Exercise Vital Sign    Days of Exercise per Week: 2 days    Minutes of Exercise per Session: 40 min  Stress: No Stress Concern Present (02/20/2023)   Received from [facility] - Occupational Stress Questionnaire    Feeling of Stress : Not at all  Social Connections: Socially Integrated (02/20/2023)   Received from [facility]   Social Network    How would you rate your social network (family, work, friends)?: Good participation with social networks  Intimate Partner Violence: Not At Risk (02/20/2023)   Received from [facility]   HITS    Over the last 12 months how often did your partner physically hurt you?: Never    Over the last 12 months how often did your partner insult you or talk down to you?: Never    Over the last 12 months how often  did your partner threaten you with physical harm?: Never    Over the last 12 months how often did your partner scream or curse at you?: Never     PHYSICAL EXAM  There were no vitals filed for this visit. There is no height or weight on file to calculate BMI.  Generalized: Well developed, in no acute distress  Cardiology: normal rate and rhythm, no murmur noted Respiratory: clear to auscultation bilaterally  Neurological examination  Mentation:  Alert oriented to time, place, history taking. Follows all commands speech and language fluent Cranial nerve II-XII: Pupils were equal round reactive to light. Extraocular movements were full, visual field were full  Motor: The motor testing reveals 5 over 5 strength of all 4 extremities. Good symmetric motor tone is noted throughout.  Gait and station: Gait is normal.    DIAGNOSTIC DATA (LABS, IMAGING, TESTING) - I reviewed patient records, labs, notes, testing and imaging myself where available.      No data to display           Lab Results  Component Value Date   WBC 10.1 10/02/2020   HGB 10.9 (L) 10/02/2020   HCT 33.6 (L) 10/02/2020   MCV 87.3 10/02/2020   PLT 155 10/02/2020      Component Value Date/Time   NA 137 10/02/2020 0324   NA 145 (H) 12/20/2019 1206   K 4.1 10/02/2020 0324   CL 105 10/02/2020 0324   CO2 24 10/02/2020 0324   GLUCOSE 155 (H) 10/02/2020 0324   BUN 13 10/02/2020 0324   BUN 15 12/20/2019 1206   CREATININE 0.67 10/02/2020 0324   CREATININE 0.89 10/16/2015 0948   CALCIUM 9.3 10/02/2020 0324   PROT 6.9 09/18/2020 1438   PROT 6.9 12/20/2019 1206   ALBUMIN 4.2 09/18/2020 1438   ALBUMIN 4.7 12/20/2019 1206   AST 27 09/18/2020 1438   ALT 21 09/18/2020 1438   ALKPHOS 106 09/18/2020 1438   BILITOT 0.4 09/18/2020 1438   BILITOT 0.6 12/20/2019 1206   GFRNONAA >60 10/02/2020 0324   GFRNONAA 71 10/10/2014 1017   GFRAA 102 12/20/2019 1206   GFRAA 81 10/10/2014 1017   Lab Results  Component Value Date   CHOL 198 12/20/2019   HDL 43 12/20/2019   LDLCALC 108 (H) 12/20/2019   TRIG 271 (H) 12/20/2019   CHOLHDL 4.6 (H) 12/20/2019   Lab Results  Component Value Date   HGBA1C 5.2 10/16/2015   Lab Results  Component Value Date   VITAMINB12 576 12/21/2006   Lab Results  Component Value Date   TSH 1.810 12/20/2019     ASSESSMENT AND PLAN 73 y.o. year old female  has a past medical history of Anemia, Arthritis, Basal cell carcinoma of nose,  Bronchitis, Bursitis of left hip, Cancer (HCC), Cancer of ovary (HCC) (2005), Carpal tunnel syndrome, Cataract, Degenerative joint disease of both hips (2019), Diverticulitis, DVT (deep vein thrombosis) in pregnancy, Family history of breast cancer, Femoral DVT (deep venous thrombosis) (HCC) (2008), FH: genetic disease carrier (11/05/2016), Genetic testing (11/04/2016), Glaucoma, History of ovarian cancer, HTN (hypertension), Hyperlipidemia, Hypertension, OSA (obstructive sleep apnea), Osteopenia (2019), Pneumonia, Shingles, and Sleep apnea. here with   No diagnosis found.    [name] is doing well on CPAP therapy. Compliance report reveals ***. *** was encouraged to continue using CPAP nightly and for greater than 4 hours each night. We will update supply orders as indicated. Risks of untreated sleep apnea review and education materials provided. Healthy lifestyle habits  encouraged. *** will follow up in ***, sooner if needed. *** verbalizes understanding and agreement with this plan.    No orders of the defined types were placed in this encounter.    No orders of the defined types were placed in this encounter.     Shawnie Dapper, FNP-C 03/08/2023, 1:53 PM [facility] [address], [address] [place], [place] [address] [phone]

## 2023-03-09 ENCOUNTER — Ambulatory Visit: Payer: Medicare PPO | Admitting: Family Medicine

## 2023-03-09 ENCOUNTER — Encounter: Payer: Self-pay | Admitting: Family Medicine

## 2023-03-09 VITALS — BP 120/68 | HR 80 | Ht 67.0 in | Wt 293.5 lb

## 2023-03-09 DIAGNOSIS — G4733 Obstructive sleep apnea (adult) (pediatric): Secondary | ICD-10-CM

## 2023-03-10 ENCOUNTER — Telehealth: Payer: Self-pay

## 2023-03-10 NOTE — Telephone Encounter (Signed)
 Epic msg sent to [facility] for updated cpap order:

## 2023-05-17 ENCOUNTER — Encounter (HOSPITAL_BASED_OUTPATIENT_CLINIC_OR_DEPARTMENT_OTHER): Payer: Self-pay | Admitting: Certified Nurse Midwife

## 2023-05-17 ENCOUNTER — Ambulatory Visit (INDEPENDENT_AMBULATORY_CARE_PROVIDER_SITE_OTHER): Admitting: Certified Nurse Midwife

## 2023-05-17 VITALS — BP 144/71 | HR 78 | Ht 65.5 in | Wt 291.8 lb

## 2023-05-17 DIAGNOSIS — Z8543 Personal history of malignant neoplasm of ovary: Secondary | ICD-10-CM

## 2023-05-17 DIAGNOSIS — B372 Candidiasis of skin and nail: Secondary | ICD-10-CM

## 2023-05-17 DIAGNOSIS — Z780 Asymptomatic menopausal state: Secondary | ICD-10-CM | POA: Diagnosis not present

## 2023-05-17 MED ORDER — FLUCONAZOLE 150 MG PO TABS: 150.0000 mg | ORAL_TABLET | ORAL | 2 refills | Status: DC | PRN

## 2023-05-17 NOTE — Progress Notes (Unsigned)
 73 y.o. G0P0 Single White or [name] female here for breast and pelvic exam. Patient reports external vulvar irritation and itching around vulva and anus. No recent antibiotics. Hx Ovarian Cancer 2005. Genetic Screening for BRCA1/2 Negative. She denies vaginal spotting or bleeding. No urinary complaints. Colonoscopy UTD. All vaccines UTD. Pt sees Dermatology regularly and includes skin cancer assessments.   Denies vaginal bleeding.  Health Maintenance: PCP:  [facility] (Dr. Daivd Dub).   Vaccines are up to date:  yes Colonoscopy:  10/01/2022, repeat planned in 2027 MMG:  06/09/2022 Negative BMD:  01/24/2020, Normal, pt agreeable to repeat Bone Density Last pap smear:  n/a .      reports that she has never smoked. She has never used smokeless tobacco. She reports that she does not currently use alcohol. She reports that she does not use drugs.  Past Medical History:  Diagnosis Date  . Anemia    oral iron occ.  . Arthritis   . Basal cell carcinoma of nose   . Bronchitis   . Bursitis of left hip   . Cancer (HCC)    Phreesia 12/19/2019  . Cancer of ovary (HCC) 2005   Stage 1A, BRCA 1/2 neg 6/14  . Carpal tunnel syndrome    Right hand  . Cataract    Phreesia 12/19/2019  . Degenerative joint disease of both hips 2019  . Diverticulitis    past history  . DVT (deep vein thrombosis) in pregnancy    LEFT LEG  . Family history of breast cancer   . Femoral DVT (deep venous thrombosis) (HCC) 2008   -"wears compression hose all times" left leg  . FH: genetic disease carrier 11/05/2016  . Genetic testing 11/04/2016   Multi-Cancer panel (83 genes) @ [facility] - see report; special interpretation  . Glaucoma    Phreesia 12/19/2019  . History of ovarian cancer   . HTN (hypertension)   . Hyperlipidemia   . Hypertension    [facility]reesia 12/19/2019  . OSA (obstructive sleep apnea)    hypoventilation, on CPAP  . Osteopenia 2019  . Pneumonia   . Shingles   . Sleep apnea    Phreesia 12/19/2019     Past Surgical History:  Procedure Laterality Date  . ABDOMINAL HYSTERECTOMY    . BASAL CELL CARCINOMA EXCISION     resection with plastic surgery reconstrustion  . BREAST LUMPECTOMY Left 1970   benign  . CATARACT EXTRACTION Bilateral 2002, 2005   with bilateral lens replacements  . COLONOSCOPY WITH PROPOFOL  N/A 05/21/2014   Procedure: COLONOSCOPY WITH PROPOFOL ;  Surgeon: Tami Falcon, MD;  Location: [facility];  Service: Endoscopy;  Laterality: N/A;  . COLONOSCOPY WITH PROPOFOL  N/A 06/26/2021   Procedure: COLONOSCOPY WITH PROPOFOL ;  Surgeon: Alvis Jourdain, MD;  Location: [facility];  Service: Gastroenterology;  Laterality: N/A;  . COLONOSCOPY WITH PROPOFOL  N/A 10/01/2022   Procedure: COLONOSCOPY WITH PROPOFOL ;  Surgeon: Alvis Jourdain, MD;  Location: [facility];  Service: Gastroenterology;  Laterality: N/A;  . EYE SURGERY    . HERNIA REPAIR  2008   umbilical repair with mesh   . POLYPECTOMY  06/26/2021   Procedure: POLYPECTOMY;  Surgeon: Alvis Jourdain, MD;  Location: [facility];  Service: Gastroenterology;;  . POLYPECTOMY  10/01/2022   Procedure: POLYPECTOMY;  Surgeon: Alvis Jourdain, MD;  Location: [facility];  Service: Gastroenterology;;  . TOTAL ABDOMINAL HYSTERECTOMY W/ BILATERAL SALPINGOOPHORECTOMY  2005   Stage IA ovarian cancer  . TOTAL HIP ARTHROPLASTY Left 06/25/2020   Procedure: TOTAL HIP ARTHROPLASTY ANTERIOR APPROACH;  Surgeon: Liliane Rei, MD;  Location: [facility];  Service: Orthopedics;  Laterality: Left;   . TOTAL HIP ARTHROPLASTY Right 10/01/2020   Procedure: TOTAL HIP ARTHROPLASTY ANTERIOR APPROACH;  Surgeon: Liliane Rei, MD;  Location: [facility];  Service: Orthopedics;  Laterality: Right;    Current Outpatient Medications  Medication Sig Dispense Refill  . atorvastatin  (LIPITOR) 40 MG tablet Take 1 tablet (40 mg total) by mouth daily. 90 tablet 3  . Cholecalciferol (VITAMIN D ) 50 MCG (2000 UT) tablet Take 2,000 Units by mouth daily. TAKES EVERY EVE     . DENTA 5000 PLUS 1.1 % CREA dental cream Place 1 application  onto teeth at bedtime.    . fish oil-omega-3 fatty acids 1000 MG capsule Take 1 g by mouth 2 (two) times daily.    . fluticasone  (FLONASE ) 50 MCG/ACT nasal spray Place 1 spray into both nostrils 2 (two) times daily. (Patient taking differently: Place 2 sprays into both nostrils daily.) 48 g 3  . furosemide  (LASIX ) 20 MG tablet Take 1 tablet (20 mg total) by mouth daily. 90 tablet 3  . lisinopril  (ZESTRIL ) 40 MG tablet Take 1 tablet (40 mg total) by mouth daily. 90 tablet 3  . Multiple Vitamin (MULTIVITAMIN) tablet Take 1 tablet by mouth every morning.    . niacin  500 MG tablet Take 1 tablet (500 mg total) by mouth every evening. 90 tablet 3  . rivaroxaban  (XARELTO ) 20 MG TABS tablet Take 1 tablet (20 mg total) by mouth daily. 90 tablet 1   No current facility-administered medications for this visit.    Family History  Problem Relation Age of Onset  . Alzheimer's disease Mother   . Hypertension Mother   . Alzheimer's disease Father   . Hypertension Father   . Colon cancer Father 82       deceased 16  . Hypertension Sister   . Breast cancer Sister 89  . Parkinson's disease Sister   . Hypertension Brother   . Prostate cancer Brother 45  . Prostate cancer Other        father's maternal half-brother  . Breast cancer Other 88       daughter of brother; currently 102    Review of Systems  Exam:   BP (!) 144/71 (BP Location: Right Arm, Patient Position: Sitting, Cuff Size: Large)   Pulse 78   Ht 5' 5.5" (1.664 m)   Wt 291 lb 12.8 oz (132.4 kg)   LMP 01/12/2003   BMI 47.82 kg/m   Height: 5' 5.5" (166.4 cm)  General appearance: alert, cooperative and appears stated age Breasts: normal appearance, no masses or tenderness, Inspection negative, No nipple retraction or dimpling, No nipple discharge or bleeding, No axillary or supraclavicular adenopathy, Normal to palpation without dominant masses Abdomen: soft, non-tender;  bowel sounds normal; no masses,  no organomegaly Lymph nodes: Cervical, supraclavicular, and axillary nodes normal.  No abnormal inguinal nodes palpated Neurologic: Grossly normal  Pelvic: External genitalia:  no lesions              Urethra:  normal appearing urethra with no masses, tenderness or lesions              Bartholins and Skenes: normal                 Vagina: normal appearing vagina with atrophic changes and no discharge, no lesions              Cervix: absent  Pap taken: No. Bimanual Exam:  Uterus:  uterus absent              Adnexa: negative for enlargement, fullness, mass, swelling, and tenderness               nus:  normal sphincter tone, no lesions  Chaperone, CMA, was present for exam.  Assessment/Plan:  1. H/O ovarian cancer (Primary) - CA 125  2. Skin yeast infection - External vulvar candidiasis - fluconazole (DIFLUCAN) 150 MG tablet; Take 1 tablet (150 mg total) by mouth every other day as needed.  Dispense: 2 tablet; Refill: 2  3. Postmenopausal - DG BONE DENSITY (DXA); Future

## 2023-05-18 ENCOUNTER — Encounter (HOSPITAL_BASED_OUTPATIENT_CLINIC_OR_DEPARTMENT_OTHER): Payer: Self-pay | Admitting: Certified Nurse Midwife

## 2023-05-18 LAB — CA 125: Cancer Antigen (CA) 125: 8.1 U/mL (ref 0.0–38.1)

## 2023-06-07 ENCOUNTER — Telehealth (HOSPITAL_BASED_OUTPATIENT_CLINIC_OR_DEPARTMENT_OTHER): Payer: Self-pay

## 2023-06-07 NOTE — Telephone Encounter (Signed)
 Patient called in today with complaints concerns that her problem is getting no better. She states that she was given 3 tablets. She has taken 2 tablets and seems to be not any closer to being better. Do you have any suggestions on anything else she can do? Please advise

## 2023-06-08 ENCOUNTER — Other Ambulatory Visit (HOSPITAL_COMMUNITY)
Admission: RE | Admit: 2023-06-08 | Discharge: 2023-06-08 | Disposition: A | Discharge status: Home / Self Care | Source: Ambulatory Visit | Attending: Obstetrics & Gynecology | Admitting: Obstetrics & Gynecology

## 2023-06-08 ENCOUNTER — Encounter (HOSPITAL_BASED_OUTPATIENT_CLINIC_OR_DEPARTMENT_OTHER): Payer: Self-pay | Admitting: Obstetrics & Gynecology

## 2023-06-08 ENCOUNTER — Ambulatory Visit (INDEPENDENT_AMBULATORY_CARE_PROVIDER_SITE_OTHER): Admitting: Obstetrics & Gynecology

## 2023-06-08 VITALS — BP 157/81 | HR 68 | Ht 65.5 in | Wt 297.0 lb

## 2023-06-08 DIAGNOSIS — N9489 Other specified conditions associated with female genital organs and menstrual cycle: Secondary | ICD-10-CM

## 2023-06-08 DIAGNOSIS — L292 Pruritus vulvae: Secondary | ICD-10-CM

## 2023-06-08 NOTE — Progress Notes (Unsigned)
 GYNECOLOGY  VISIT  CC:   vulvar itching  HPI: 73 y.o. G0P0 Single White or [name] female here for ongoing complaint of vulvar itching and burning sensation.  This is relatively new but has been present for several weeks.  Has been treated with fluconazole  and has taken two doses now on two separate occasions over the course of several days.  Symptoms are not improved.  No recent antibiotic use.  Denies vaginal bleeding.  H/o TAH/BSO 2005 due to Stage 1A ovarian cancer.    Does have hx of skin changes in skin folds.  Dermatology thinks this is not yeast and due to heat/friction.     Past Medical History:  Diagnosis Date   Anemia    oral iron occ.   Arthritis    Basal cell carcinoma of nose    Bronchitis    Bursitis of left hip    Cancer (HCC)    [facility] 12/19/2019   Cancer of ovary (HCC) 2005   Stage 1A, BRCA 1/2 neg 6/14   Carpal tunnel syndrome    Right hand   Cataract    [facility] 12/19/2019   Degenerative joint disease of both hips 2019   Diverticulitis    past history   DVT (deep vein thrombosis) in pregnancy    LEFT LEG   Family history of breast cancer    Femoral DVT (deep venous thrombosis) (HCC) 2008   -"wears compression hose all times" left leg   FH: genetic disease carrier 11/05/2016   Genetic testing 11/04/2016   Multi-Cancer panel (83 genes) @ [facility] - see report; special interpretation   Glaucoma    [facility] 12/19/2019   History of ovarian cancer    HTN (hypertension)    Hyperlipidemia    Hypertension    [facility] 12/19/2019   OSA (obstructive sleep apnea)    hypoventilation, on CPAP   Osteopenia 2019   Pneumonia    Shingles    Sleep apnea    [facility] 12/19/2019    MEDS:   Current Outpatient Medications on File Prior to Visit  Medication Sig Dispense Refill   atorvastatin  (LIPITOR) 40 MG tablet Take 1 tablet (40 mg total) by mouth daily. 90 tablet 3   Cholecalciferol (VITAMIN D ) 50 MCG (2000 UT) tablet Take 2,000 Units by mouth daily. TAKES  EVERY EVE     DENTA 5000 PLUS 1.1 % CREA dental cream Place 1 application  onto teeth at bedtime.     fish oil-omega-3 fatty acids 1000 MG capsule Take 1 g by mouth 2 (two) times daily.     fluticasone  (FLONASE ) 50 MCG/ACT nasal spray Place 1 spray into both nostrils 2 (two) times daily. (Patient taking differently: Place 2 sprays into both nostrils daily.) 48 g 3   furosemide  (LASIX ) 20 MG tablet Take 1 tablet (20 mg total) by mouth daily. 90 tablet 3   lisinopril  (ZESTRIL ) 40 MG tablet Take 1 tablet (40 mg total) by mouth daily. 90 tablet 3   Multiple Vitamin (MULTIVITAMIN) tablet Take 1 tablet by mouth every morning.     niacin  500 MG tablet Take 1 tablet (500 mg total) by mouth every evening. 90 tablet 3   rivaroxaban  (XARELTO ) 20 MG TABS tablet Take 1 tablet (20 mg total) by mouth daily. 90 tablet 1   fluconazole  (DIFLUCAN ) 150 MG tablet Take 1 tablet (150 mg total) by mouth every other day as needed. (Patient not taking: Reported on 06/08/2023) 2 tablet 2   No current facility-administered medications on file  prior to visit.    ALLERGIES: Valtrex  [valacyclovir  hcl], Benoxinate base, Iodinated contrast media, and Sulfa antibiotics  SH:  single, non smoker  Review of Systems  Constitutional: Negative.   Genitourinary:        Vulvar itching    PHYSICAL EXAMINATION:    BP (!) 157/81 (BP Location: Right Wrist, Patient Position: Sitting, Cuff Size: Normal)   Pulse 68   Ht 5' 5.5" (1.664 m)   Wt 297 lb (134.7 kg)   LMP 01/12/2003   BMI 48.67 kg/m     General appearance: alert, cooperative and appears stated age  Lymph:  no inguinal LAD noted Pelvic: External genitalia:  hypopigmentation and thickening of tissue noted especially on right inferior inner labia majora but hypopigmentation noted on inner labia majora bilaterally              Urethra:  normal appearing urethra with no masses, tenderness or lesions              Bartholins and Skenes: normal                 Vagina:  erythema with vaginal discharge noted              Cervix: absent              Anus:  no lesions  Chaperone, Myrtie Atkinson, CMA, was present for exam.  Assessment/Plan: 1. Vulvar burning (Primary) - will check for yeast and BV - advised to not use any more antifungal until testing is completed. - Cervicovaginal ancillary only( [place])  2. Vulvar itching

## 2023-06-09 LAB — CERVICOVAGINAL ANCILLARY ONLY
Bacterial Vaginitis (gardnerella): POSITIVE — AB
Candida Glabrata: NEGATIVE
Candida Vaginitis: NEGATIVE
Comment: NEGATIVE
Comment: NEGATIVE
Comment: NEGATIVE

## 2023-06-10 ENCOUNTER — Ambulatory Visit (HOSPITAL_BASED_OUTPATIENT_CLINIC_OR_DEPARTMENT_OTHER): Payer: Self-pay | Admitting: Obstetrics & Gynecology

## 2023-06-10 DIAGNOSIS — B9689 Other specified bacterial agents as the cause of diseases classified elsewhere: Secondary | ICD-10-CM

## 2023-06-10 MED ORDER — METRONIDAZOLE 500 MG PO TABS
500.0000 mg | ORAL_TABLET | Freq: Two times a day (BID) | ORAL | 0 refills | Status: AC
Start: 2023-06-10 — End: ?

## 2023-06-22 ENCOUNTER — Telehealth (HOSPITAL_BASED_OUTPATIENT_CLINIC_OR_DEPARTMENT_OTHER): Payer: Self-pay

## 2023-06-22 NOTE — Telephone Encounter (Signed)
 FYI:  Patient called to give follow up per Dr. Annabell Key. She states that the rash on the outside has minimally improved, however the inside burning is somewhat gone but still a little ticklish. Patient states that she believes that the issue still has not been addressed. Would like to know what is the next step.

## 2023-06-22 NOTE — Telephone Encounter (Signed)
 Called and spoke with patient. Patient has agreed to come in to do vulvar biopsy. Sent to the front to be scheduled. tbw

## 2023-06-22 NOTE — Telephone Encounter (Signed)
 Patient called to give follow up per Dr. Annabell Key. She states that the rash on the

## 2023-06-28 ENCOUNTER — Ambulatory Visit (HOSPITAL_BASED_OUTPATIENT_CLINIC_OR_DEPARTMENT_OTHER): Admitting: Obstetrics & Gynecology

## 2023-06-28 ENCOUNTER — Other Ambulatory Visit (HOSPITAL_COMMUNITY)
Admission: RE | Admit: 2023-06-28 | Discharge: 2023-06-28 | Disposition: A | Discharge status: Home / Self Care | Source: Ambulatory Visit | Attending: Obstetrics & Gynecology | Admitting: Obstetrics & Gynecology

## 2023-06-28 VITALS — BP 138/80

## 2023-06-28 DIAGNOSIS — N9489 Other specified conditions associated with female genital organs and menstrual cycle: Secondary | ICD-10-CM | POA: Insufficient documentation

## 2023-06-28 MED ORDER — CLOBETASOL PROPIONATE 0.05 % EX OINT
1.0000 | TOPICAL_OINTMENT | Freq: Two times a day (BID) | CUTANEOUS | 0 refills | Status: DC
Start: 2023-06-28 — End: 2023-07-01

## 2023-06-28 NOTE — Progress Notes (Signed)
 GYNECOLOGY  VISIT  CC:   vulvar burning, biopsy planned today  HPI: 73 y.o. G0P0 Single White or [name] female here for vulvar biopsy due to vulvar burning.  Was treated with oral antifungal.  Pt thinks this may have helped a little bit but symptoms seem worse.  She thinks this may be due to a topical wipe she is using.  Denies bleeding.  .   Past Medical History:  Diagnosis Date   Anemia    oral iron occ.   Arthritis    Basal cell carcinoma of nose    Bronchitis    Bursitis of left hip    Cancer (HCC)    [facility] 12/19/2019   Cancer of ovary (HCC) 2005   Stage 1A, BRCA 1/2 neg 6/14   Carpal tunnel syndrome    Right hand   Cataract    [facility] 12/19/2019   Degenerative joint disease of both hips 2019   Diverticulitis    past history   DVT (deep vein thrombosis) in pregnancy    LEFT LEG   Family history of breast cancer    Femoral DVT (deep venous thrombosis) (HCC) 2008   -wears compression hose all times left leg   FH: genetic disease carrier 11/05/2016   Genetic testing 11/04/2016   Multi-Cancer panel (83 genes) @ [facility] - see report; special interpretation   Glaucoma    [facility] 12/19/2019   History of ovarian cancer    HTN (hypertension)    Hyperlipidemia    Hypertension    [facility] 12/19/2019   OSA (obstructive sleep apnea)    hypoventilation, on CPAP   Osteopenia 2019   Pneumonia    Shingles    Sleep apnea    [facility] 12/19/2019    MEDS:   Current Outpatient Medications on File Prior to Visit  Medication Sig Dispense Refill   atorvastatin  (LIPITOR) 40 MG tablet Take 1 tablet (40 mg total) by mouth daily. 90 tablet 3   Cholecalciferol (VITAMIN D ) 50 MCG (2000 UT) tablet Take 2,000 Units by mouth daily. TAKES EVERY EVE     DENTA 5000 PLUS 1.1 % CREA dental cream Place 1 application  onto teeth at bedtime.     fish oil-omega-3 fatty acids 1000 MG capsule Take 1 g by mouth 2 (two) times daily.     fluticasone  (FLONASE ) 50 MCG/ACT nasal spray Place 1  spray into both nostrils 2 (two) times daily. (Patient taking differently: Place 2 sprays into both nostrils daily.) 48 g 3   furosemide  (LASIX ) 20 MG tablet Take 1 tablet (20 mg total) by mouth daily. 90 tablet 3   lisinopril  (ZESTRIL ) 40 MG tablet Take 1 tablet (40 mg total) by mouth daily. 90 tablet 3   Multiple Vitamin (MULTIVITAMIN) tablet Take 1 tablet by mouth every morning.     niacin  500 MG tablet Take 1 tablet (500 mg total) by mouth every evening. 90 tablet 3   rivaroxaban  (XARELTO ) 20 MG TABS tablet Take 1 tablet (20 mg total) by mouth daily. 90 tablet 1   fluconazole  (DIFLUCAN ) 150 MG tablet Take 1 tablet (150 mg total) by mouth every other day as needed. (Patient not taking: Reported on 06/08/2023) 2 tablet 2   metroNIDAZOLE  (FLAGYL ) 500 MG tablet Take 1 tablet (500 mg total) by mouth 2 (two) times daily. 14 tablet 0   No current facility-administered medications on file prior to visit.    ALLERGIES: Valtrex  [valacyclovir  hcl], Benoxinate base, Iodinated contrast media, and Sulfa antibiotics  SH:  non smoker  Review of Systems  Constitutional: Negative.   Genitourinary:        Vulvar burning    PHYSICAL EXAMINATION:    BP 138/80 (BP Location: Left Arm, Patient Position: Sitting, Cuff Size: Large)   LMP 01/12/2003     General appearance: alert, cooperative and appears stated age  Lymph:  no inguinal LAD noted Pelvic: External genitalia:  inner labia erythema with white plaque like findings in symmetric pattern on inner lower labia and towards buttocks  Procedure:  Area cleansed with Betadine .  Sterile technique used throughout procedure.  Skin anesthestized with Lidocaine  1% plain; 2.45mL.   3 punch biopsy used to obtain specimen.  Biopsy grasped with pick-ups and excised with scissors.  Adequate hemostasis obtained with silver nitrate sticks.  Dressing was not applied.  Pt tolerated procedure well.  Chaperone, Vertis Gosselin, CMA, was present for  exam.  Assessment/Plan: 1. Vulvar burning (Primary) - will start treatment with topical clobetasol 0.05% ointment BID until biopsy results are finalized - Surgical pathology( [place]/ [facility])

## 2023-06-30 ENCOUNTER — Ambulatory Visit (HOSPITAL_BASED_OUTPATIENT_CLINIC_OR_DEPARTMENT_OTHER): Payer: Self-pay | Admitting: Obstetrics & Gynecology

## 2023-06-30 LAB — SURGICAL PATHOLOGY

## 2023-07-01 ENCOUNTER — Other Ambulatory Visit (HOSPITAL_BASED_OUTPATIENT_CLINIC_OR_DEPARTMENT_OTHER): Payer: Self-pay | Admitting: Obstetrics & Gynecology

## 2023-07-01 DIAGNOSIS — L900 Lichen sclerosus et atrophicus: Secondary | ICD-10-CM

## 2023-07-01 MED ORDER — CLOBETASOL PROPIONATE 0.05 % EX OINT
1.0000 | TOPICAL_OINTMENT | Freq: Two times a day (BID) | CUTANEOUS | 0 refills | Status: DC
Start: 2023-07-01 — End: 2023-11-16

## 2023-07-07 ENCOUNTER — Ambulatory Visit (HOSPITAL_BASED_OUTPATIENT_CLINIC_OR_DEPARTMENT_OTHER): Payer: Medicare PPO | Admitting: Obstetrics & Gynecology

## 2023-08-08 ENCOUNTER — Ambulatory Visit (HOSPITAL_BASED_OUTPATIENT_CLINIC_OR_DEPARTMENT_OTHER): Admitting: Obstetrics & Gynecology

## 2023-08-08 VITALS — BP 124/71 | HR 78 | Wt 295.0 lb

## 2023-08-08 DIAGNOSIS — L900 Lichen sclerosus et atrophicus: Secondary | ICD-10-CM

## 2023-08-08 DIAGNOSIS — N904 Leukoplakia of vulva: Secondary | ICD-10-CM

## 2023-08-08 MED ORDER — MOMETASONE FUROATE 0.1 % EX OINT
TOPICAL_OINTMENT | CUTANEOUS | 2 refills | Status: AC
Start: 2023-08-08 — End: ?

## 2023-08-08 NOTE — Progress Notes (Unsigned)
 GYNECOLOGY  VISIT  CC:  follow up   HPI: 73 y.o. G0P0 Single White or [name] female here for vulvar recheck after starting topical steroid for biopsy proven lichen sclerosus.  Itching has resolved.  She did have an issue with a rash after exercising in the pool one day last week.  This almost felt like a burn to her.  The area was about 4 inches.    Unrelated, she has noted more firmed bowel movements and she feels this helped.  About three or four days ago, she had some unusual gas.  She's not sure if this was related.    Denies any bleeding.     Past Medical History:  Diagnosis Date  . Anemia    oral iron occ.  . Arthritis   . Basal cell carcinoma of nose   . Bronchitis   . Bursitis of left hip   . Cancer (HCC)    [facility] 12/19/2019  . Cancer of ovary (HCC) 2005   Stage 1A, BRCA 1/2 neg 6/14  . Carpal tunnel syndrome    Right hand  . Cataract    [facility] 12/19/2019  . Degenerative joint disease of both hips 2019  . Diverticulitis    past history  . DVT (deep vein thrombosis) in pregnancy    LEFT LEG  . Family history of breast cancer   . Femoral DVT (deep venous thrombosis) (HCC) 2008   -wears compression hose all times left leg  . FH: genetic disease carrier 11/05/2016  . Genetic testing 11/04/2016   Multi-Cancer panel (83 genes) @ [facility] - see report; special interpretation  . Glaucoma    [facility] 12/19/2019  . History of ovarian cancer   . HTN (hypertension)   . Hyperlipidemia   . Hypertension    [facility] 12/19/2019  . OSA (obstructive sleep apnea)    hypoventilation, on CPAP  . Osteopenia 2019  . Pneumonia   . Shingles   . Sleep apnea    [facility] 12/19/2019    MEDS:   Current Outpatient Medications on File Prior to Visit  Medication Sig Dispense Refill  . atorvastatin  (LIPITOR) 40 MG tablet Take 1 tablet (40 mg total) by mouth daily. 90 tablet 3  . Cholecalciferol (VITAMIN D ) 50 MCG (2000 UT) tablet Take 2,000 Units by mouth daily. TAKES EVERY  EVE    . clobetasol  ointment (TEMOVATE ) 0.05 % Apply 1 Application topically 2 (two) times daily. Apply as directed twice daily 60 g 0  . DENTA 5000 PLUS 1.1 % CREA dental cream Place 1 application  onto teeth at bedtime.    . fish oil-omega-3 fatty acids 1000 MG capsule Take 1 g by mouth 2 (two) times daily.    . fluticasone  (FLONASE ) 50 MCG/ACT nasal spray Place 1 spray into both nostrils 2 (two) times daily. (Patient taking differently: Place 2 sprays into both nostrils daily.) 48 g 3  . furosemide  (LASIX ) 20 MG tablet Take 1 tablet (20 mg total) by mouth daily. 90 tablet 3  . lisinopril  (ZESTRIL ) 40 MG tablet Take 1 tablet (40 mg total) by mouth daily. 90 tablet 3  . Multiple Vitamin (MULTIVITAMIN) tablet Take 1 tablet by mouth every morning.    . niacin  500 MG tablet Take 1 tablet (500 mg total) by mouth every evening. 90 tablet 3  . rivaroxaban  (XARELTO ) 20 MG TABS tablet Take 1 tablet (20 mg total) by mouth daily. 90 tablet 1   No current facility-administered medications on file prior to visit.  ALLERGIES: Valtrex  [valacyclovir  hcl], Benoxinate base, Iodinated contrast media, and Sulfa antibiotics  SH:  single, non smoker  ROS  PHYSICAL EXAMINATION:    BP 124/71 (BP Location: Right Arm, Patient Position: Sitting, Cuff Size: Large)   Pulse 78   Wt 295 lb (133.8 kg)   LMP 01/12/2003   SpO2 95%   BMI 48.34 kg/m     General appearance: alert, cooperative and appears stated age Neck: no adenopathy, supple, symmetrical, trachea midline and thyroid {CHL AMB PHY EX THYROID NORM [phone]::normal to inspection and palpation} CV:  {Exam; heart brief:31539} Lungs:  {pe lungs ob:314451} Breasts: {Exam; breast:13139::normal appearance, no masses or tenderness} Abdomen: soft, non-tender; bowel sounds normal; no masses,  no organomegaly Lymph:  no inguinal LAD noted  Pelvic: External genitalia:  no lesions              Urethra:  normal appearing urethra with no masses,  tenderness or lesions              Bartholins and Skenes: normal                 Vagina: {exam; pelvic vaginal:30846}              Cervix: {CHL AMB PHY EX CERVIX NORM DEFAULT:[phone]::no lesions}              Bimanual Exam:  Uterus:  {CHL AMB PHY EX UTERUS NORM [phone]::normal size, contour, position, consistency, mobility, non-tender}              Adnexa: {CHL AMB PHY EX ADNEXA NO MASS DEFAULT:[phone]::no mass, fullness, tenderness}              Rectovaginal: {yes no:314532}.  Confirms.              Anus:  normal sphincter tone, no lesions  Chaperone, ***, CMA, was present for exam.  Assessment/Plan: There are no diagnoses linked to this encounter.

## 2023-08-08 NOTE — Patient Instructions (Addendum)
 Decrease the topical steroid ointment to nightly and specifically pay attention to the areas we looked at today.    After a month, you will then switch to using mometasone  for maintenance twice weekly.    If you have a flare, you can increase the clobetasol  (the stronger steroid) to twice daily for up to 5 days.  If this doesn't resolve the itching, let me know please.  If you have any bleeding or rough feeling skin on the vulva, let me know.

## 2023-08-11 ENCOUNTER — Encounter (HOSPITAL_BASED_OUTPATIENT_CLINIC_OR_DEPARTMENT_OTHER): Payer: Self-pay | Admitting: Obstetrics & Gynecology

## 2023-08-11 DIAGNOSIS — L900 Lichen sclerosus et atrophicus: Secondary | ICD-10-CM | POA: Insufficient documentation

## 2023-10-12 ENCOUNTER — Telehealth: Payer: Self-pay | Admitting: Family Medicine

## 2023-10-12 NOTE — Telephone Encounter (Signed)
-----   Message from Greig Forbes sent at 03/09/2023  2:41 PM EST ----- Order HST

## 2023-10-19 NOTE — Telephone Encounter (Signed)
 Per Angie:

## 2023-10-19 NOTE — Telephone Encounter (Signed)
 Spoke w/ Meagan A/sleep lab. Ok to schedule visit and then have Amy order sleep test. They will get insurance auth for sleep test prior to scheduling patient

## 2023-11-14 NOTE — Progress Notes (Unsigned)
 PATIENT: [name]  A Porro DOB: 1950/08/02  REASON FOR VISIT: follow up HISTORY FROM: patient  No chief complaint on file.    HISTORY OF PRESENT ILLNESS:  11/14/23 ALL:  [name]  returns for follow up for OSA on CPAP. She was last seen 02/2023 and doing well. She is eligible for a new machine.   03/09/2023 ALL:  [name] is a 73 y.o. female here today for follow up for OSA on CPAP. She continues to do well on therapy. She is using CPAP nightly for about 7 hours, on average. She denies concerns with machine. She does have a leak but not bothered by this and AHi well managed. She does not feel she can tighten headgear due to lingering marks on her face. She was set up with last machine late 2020.      09/08/21 ALL (Mychart) [name]  returns for follow up for OSA on CPAP. She continues to do well. She is using her CPAP nightly for about 7 hours. She has noted her events seem a little higher than normal but has been taking an antihistamine. Average AHI around 2.       07/31/2020 ALL (Mychart) [name] is a 73 y.o. female here today for follow up for OSA on CPAP. She continues to do well on therapy. She is using CPAP nightly. 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 She feels that she is doing fairly well. She is planning to have the right hip replaced in September. She has had some difficulty communicating with DME but feels she has gotten her issues resolved.         REVIEW OF SYSTEMS: Out of a complete 14 system review of symptoms, the patient complains only of the following symptoms, allergies and all other reviewed systems are negative.  ESS: 2/24  ALLERGIES: Allergies  Allergen Reactions   Valtrex  [Valacyclovir  Hcl] Swelling and Rash    Swelling of face and arm, rash   Benoxinate Base     Turns eyes blood red/burning. --in eyedrops--   Iodinated Contrast Media Rash    Rash on back Other reaction(s): Unknown   Sulfa Antibiotics Rash     HOME MEDICATIONS: Outpatient Medications Prior to Visit  Medication Sig Dispense Refill   atorvastatin  (LIPITOR) 40 MG tablet Take 1 tablet (40 mg total) by mouth daily. 90 tablet 3   Cholecalciferol (VITAMIN D ) 50 MCG (2000 UT) tablet Take 2,000 Units by mouth daily. TAKES EVERY EVE     clobetasol  ointment (TEMOVATE ) 0.05 % Apply 1 Application topically 2 (two) times daily. Apply as directed twice daily 60 g 0   DENTA 5000 PLUS 1.1 % CREA dental cream Place 1 application  onto teeth at bedtime.     fish oil-omega-3 fatty acids 1000 MG capsule Take 1 g by mouth 2 (two) times daily.     fluticasone  (FLONASE ) 50 MCG/ACT nasal spray Place 1 spray into both nostrils 2 (two) times daily. (Patient taking differently: Place 2 sprays into both nostrils daily.) 48 g 3   furosemide  (LASIX ) 20 MG tablet Take 1 tablet (20 mg total) by mouth daily. 90 tablet 3   lisinopril  (ZESTRIL ) 40 MG tablet Take 1 tablet (40 mg total) by mouth daily. 90 tablet 3   mometasone  (ELOCON ) 0.1 % ointment Apply topically twice weekly. Use this for skin maintenance. 45 g 2   Multiple Vitamin (MULTIVITAMIN) tablet Take 1 tablet by mouth every morning.     niacin  500 MG  tablet Take 1 tablet (500 mg total) by mouth every evening. 90 tablet 3   rivaroxaban  (XARELTO ) 20 MG TABS tablet Take 1 tablet (20 mg total) by mouth daily. 90 tablet 1   No facility-administered medications prior to visit.    PAST MEDICAL HISTORY: Past Medical History:  Diagnosis Date   Anemia    oral iron occ.   Arthritis    Basal cell carcinoma of nose    Bronchitis    Bursitis of left hip    Cancer (HCC)    Phreesia 12/19/2019   Cancer of ovary (HCC) 2005   Stage 1A, BRCA 1/2 neg 6/14   Carpal tunnel syndrome    Right hand   Cataract    Phreesia 12/19/2019   Degenerative joint disease of both hips 2019   Diverticulitis    past history   DVT (deep vein thrombosis) in pregnancy    LEFT LEG   Family history of breast cancer    Femoral  DVT (deep venous thrombosis) (HCC) 2008   -wears compression hose all times left leg   FH: genetic disease carrier 11/05/2016   Genetic testing 11/04/2016   Multi-Cancer panel (83 genes) @ [facility] - see report; special interpretation   Glaucoma    Phreesia 12/19/2019   History of ovarian cancer    HTN (hypertension)    Hyperlipidemia    Hypertension    Phreesia 12/19/2019   OSA (obstructive sleep apnea)    hypoventilation, on CPAP   Osteopenia 2019   Pneumonia    Shingles    Sleep apnea    Phreesia 12/19/2019    PAST SURGICAL HISTORY: Past Surgical History:  Procedure Laterality Date   ABDOMINAL HYSTERECTOMY     BASAL CELL CARCINOMA EXCISION     resection with plastic surgery reconstrustion   BREAST LUMPECTOMY Left 1970   benign   CATARACT EXTRACTION Bilateral 2002, 2005   with bilateral lens replacements   COLONOSCOPY WITH PROPOFOL  N/A 05/21/2014   Procedure: COLONOSCOPY WITH PROPOFOL ;  Surgeon: Renaye Sous, MD;  Location: [facility];  Service: Endoscopy;  Laterality: N/A;   COLONOSCOPY WITH PROPOFOL  N/A 06/26/2021   Procedure: COLONOSCOPY WITH PROPOFOL ;  Surgeon: Rollin Dover, MD;  Location: [facility];  Service: Gastroenterology;  Laterality: N/A;   COLONOSCOPY WITH PROPOFOL  N/A 10/01/2022   Procedure: COLONOSCOPY WITH PROPOFOL ;  Surgeon: Rollin Dover, MD;  Location: [facility];  Service: Gastroenterology;  Laterality: N/A;   EYE SURGERY     HERNIA REPAIR  2008   umbilical repair with mesh    POLYPECTOMY  06/26/2021   Procedure: POLYPECTOMY;  Surgeon: Rollin Dover, MD;  Location: [facility];  Service: Gastroenterology;;   POLYPECTOMY  10/01/2022   Procedure: POLYPECTOMY;  Surgeon: Rollin Dover, MD;  Location: [facility];  Service: Gastroenterology;;   TOTAL ABDOMINAL HYSTERECTOMY W/ BILATERAL SALPINGOOPHORECTOMY  2005   Stage IA ovarian cancer   TOTAL HIP ARTHROPLASTY Left 06/25/2020   Procedure: TOTAL HIP ARTHROPLASTY ANTERIOR APPROACH;  Surgeon: Melodi Lerner, MD;  Location: [facility];  Service: Orthopedics;  Laterality: Left;    TOTAL HIP ARTHROPLASTY Right 10/01/2020   Procedure: TOTAL HIP ARTHROPLASTY ANTERIOR APPROACH;  Surgeon: Melodi Lerner, MD;  Location: [facility];  Service: Orthopedics;  Laterality: Right;    FAMILY HISTORY: Family History  Problem Relation Age of Onset   Alzheimer's disease Mother    Hypertension Mother    Alzheimer's disease Father    Hypertension Father    Colon cancer Father 59  deceased 86   Hypertension Sister    Breast cancer Sister 20   Parkinson's disease Sister    Hypertension Brother    Prostate cancer Brother 26   Prostate cancer Other        father's maternal half-brother   Breast cancer Other 55       daughter of brother; currently 67    SOCIAL HISTORY: Social History   Socioeconomic History   Marital status: Single    Spouse name: Not on file   Number of children: 0   Years of education: masters   Highest education level: Master's degree (e.g., MA, MS, MEng, MEd, MSW, MBA)  Occupational History   Occupation: Engineer, Maintenance (it): [employer]    Comment: fifth grade (retired)  Tobacco Use   Smoking status: Never   Smokeless tobacco: Never   Tobacco comments:    only for 6 months in the 70s  Vaping Use   Vaping status: Never Used  Substance and Sexual Activity   Alcohol use: Not Currently    Alcohol/week: 0.0 standard drinks of alcohol   Drug use: Never   Sexual activity: Not Currently    Birth control/protection: Surgical    Comment: pt strait   Other Topics Concern   Not on file  Social History Narrative   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 PSH showed an AHI of 120!!!! titrated to 8 cm watr cPAP , residual AHI of 1 and downlaod was done  01-23-11 .  excellent compliance - 7 hours and low leak,  nasal pillow mask.    BMI is considered morbidly obese.  discussed low carb  diet - weight watchers and restricted exercise. , aquatic  .    Patient cannot exercise due to soreness in proximal muscles.and  would like to add coenzyme q 10 and carnitine . She needs a medical weight loss regimen - bariatric surgery?       FSS 48, Epworth 4 again ,  CMS compliant  residual AHI 1.1 and average use 6.48  hours , no naps    Patient is single and lives alone.   Patient does not have any children.   Patient is retired.   Patient has a Master's degree.   Patient is left-handed.   Patient drinks tea occasionally.             Social Drivers of Corporate Investment Banker Strain: Low Risk  (08/23/2023)   Received from [facility]   Overall Financial Resource Strain (CARDIA)    How hard is it for you to pay for the very basics like food, housing, medical care, and heating?: Not hard at all  Food Insecurity: No Food Insecurity (08/23/2023)   Received from [facility]   Hunger Vital Sign    Within the past 12 months, you worried that your food would run out before you got the money to buy more.: Never true    Within the past 12 months, the food you bought just didn't last and you didn't have money to get more.: Never true  Transportation Needs: No Transportation Needs (08/23/2023)   Received from [facility] - Transportation    In the past 12 months, has lack of transportation kept you from medical appointments or from getting medications?: No    In the past 12 months, has lack of transportation kept you from meetings, work, or from getting things needed for daily living?:  No  Physical Activity: Insufficiently Active (08/23/2023)   Received from [facility]   Exercise Vital Sign    On average, how many days per week do you engage in moderate to strenuous exercise (like a brisk walk)?: 2 days    On average, how many minutes do you engage in exercise at this level?: 40 min  Stress: No Stress Concern Present (08/23/2023)   Received from [facility] - Occupational Stress  Questionnaire    Do you feel stress - tense, restless, nervous, or anxious, or unable to sleep at night because your mind is troubled all the time - these days?: Not at all  Social Connections: Socially Integrated (08/23/2023)   Received from [facility]   Social Network    How would you rate your social network (family, work, friends)?: Good participation with social networks  Intimate Partner Violence: Not At Risk (02/20/2023)   Received from [facility]   HITS    Over the last 12 months how often did your partner physically hurt you?: Never    Over the last 12 months how often did your partner insult you or talk down to you?: Never    Over the last 12 months how often did your partner threaten you with physical harm?: Never    Over the last 12 months how often did your partner scream or curse at you?: Never     PHYSICAL EXAM  There were no vitals filed for this visit.  There is no height or weight on file to calculate BMI.  Neck cir: 18 Mallampati: 4  Generalized: Well developed, in no acute distress  Cardiology: normal rate and rhythm, no murmur noted Respiratory: clear to auscultation bilaterally  Neurological examination  Mentation: Alert oriented to time, place, history taking. Follows all commands speech and language fluent Cranial nerve II-XII: Pupils were equal round reactive to light. Extraocular movements were full, visual field were full  Motor: The motor testing reveals 5 over 5 strength of all 4 extremities. Good symmetric motor tone is noted throughout.  Gait and station: Gait is normal.    DIAGNOSTIC DATA (LABS, IMAGING, TESTING) - I reviewed patient records, labs, notes, testing and imaging myself where available.      No data to display           Lab Results  Component Value Date   WBC 10.1 10/02/2020   HGB 10.9 (L) 10/02/2020   HCT 33.6 (L) 10/02/2020   MCV 87.3 10/02/2020   PLT 155 10/02/2020      Component Value Date/Time   NA 137  10/02/2020 0324   NA 145 (H) 12/20/2019 1206   K 4.1 10/02/2020 0324   CL 105 10/02/2020 0324   CO2 24 10/02/2020 0324   GLUCOSE 155 (H) 10/02/2020 0324   BUN 13 10/02/2020 0324   BUN 15 12/20/2019 1206   CREATININE 0.67 10/02/2020 0324   CREATININE 0.89 10/16/2015 0948   CALCIUM  9.3 10/02/2020 0324   PROT 6.9 09/18/2020 1438   PROT 6.9 12/20/2019 1206   ALBUMIN  4.2 09/18/2020 1438   ALBUMIN  4.7 12/20/2019 1206   AST 27 09/18/2020 1438   ALT 21 09/18/2020 1438   ALKPHOS 106 09/18/2020 1438   BILITOT 0.4 09/18/2020 1438   BILITOT 0.6 12/20/2019 1206   GFRNONAA >60 10/02/2020 0324   GFRNONAA 71 10/10/2014 1017   GFRAA 102 12/20/2019 1206   GFRAA 81 10/10/2014 1017   Lab Results  Component Value  Date   CHOL 198 12/20/2019   HDL 43 12/20/2019   LDLCALC 108 (H) 12/20/2019   TRIG 271 (H) 12/20/2019   CHOLHDL 4.6 (H) 12/20/2019   Lab Results  Component Value Date   HGBA1C 5.2 10/16/2015   Lab Results  Component Value Date   VITAMINB12 576 12/21/2006   Lab Results  Component Value Date   TSH 1.810 12/20/2019    ASSESSMENT AND PLAN 73 y.o. year old female  has a past medical history of Anemia, Arthritis, Basal cell carcinoma of nose, Bronchitis, Bursitis of left hip, Cancer (HCC), Cancer of ovary (HCC) (2005), Carpal tunnel syndrome, Cataract, Degenerative joint disease of both hips (2019), Diverticulitis, DVT (deep vein thrombosis) in pregnancy, Family history of breast cancer, Femoral DVT (deep venous thrombosis) (HCC) (2008), FH: genetic disease carrier (11/05/2016), Genetic testing (11/04/2016), Glaucoma, History of ovarian cancer, HTN (hypertension), Hyperlipidemia, Hypertension, OSA (obstructive sleep apnea), Osteopenia (2019), Pneumonia, Shingles, and Sleep apnea. here with   No diagnosis found.    [name] is doing well on CPAP therapy. Compliance report reveals excellent compliance. She was encouraged to continue using CPAP nightly and for greater than 4  hours each night. We will update supply orders as indicated. Risks of untreated sleep apnea review and education materials provided. Healthy lifestyle habits encouraged. Will plan to repeat HST late 2025 and order new machine pending results. She will follow up in 1 year, sooner if needed. She verbalizes understanding and agreement with this plan.    No orders of the defined types were placed in this encounter.    No orders of the defined types were placed in this encounter.     Greig Forbes, FNP-C 11/14/2023, 11:28 AM [facility] [address], [address] [place], [place] [address] [phone]

## 2023-11-14 NOTE — Patient Instructions (Signed)

## 2023-11-15 NOTE — Progress Notes (Unsigned)
 SABRA

## 2023-11-16 ENCOUNTER — Encounter: Payer: Self-pay | Admitting: Family Medicine

## 2023-11-16 ENCOUNTER — Ambulatory Visit: Admitting: Family Medicine

## 2023-11-16 VITALS — BP 136/78 | HR 79 | Resp 16

## 2023-11-16 DIAGNOSIS — G4733 Obstructive sleep apnea (adult) (pediatric): Secondary | ICD-10-CM

## 2023-11-25 ENCOUNTER — Telehealth (HOSPITAL_BASED_OUTPATIENT_CLINIC_OR_DEPARTMENT_OTHER): Payer: Self-pay

## 2023-11-25 NOTE — Telephone Encounter (Signed)
 Patient called today to let Cleotilde know that she has been using the mometasone  and it is not working. She states she has been very patient but would like to know if you have any other recommendations. tbw

## 2023-12-06 ENCOUNTER — Other Ambulatory Visit (HOSPITAL_BASED_OUTPATIENT_CLINIC_OR_DEPARTMENT_OTHER): Payer: Self-pay

## 2023-12-06 MED ORDER — CLOBETASOL PROPIONATE 0.05 % EX OINT: 1.0000 | TOPICAL_OINTMENT | Freq: Two times a day (BID) | CUTANEOUS | 0 refills | Status: AC

## 2023-12-06 NOTE — Telephone Encounter (Signed)
 Called patient. She will stop mometasone  (she feels it is not working anyway). Start clobetasol  nightly. She will come in for an appointment on 12/12. tbw

## 2023-12-23 ENCOUNTER — Ambulatory Visit (HOSPITAL_BASED_OUTPATIENT_CLINIC_OR_DEPARTMENT_OTHER): Admitting: Obstetrics & Gynecology

## 2024-01-18 ENCOUNTER — Encounter (HOSPITAL_BASED_OUTPATIENT_CLINIC_OR_DEPARTMENT_OTHER): Payer: Self-pay | Admitting: Obstetrics & Gynecology

## 2024-01-18 ENCOUNTER — Ambulatory Visit (HOSPITAL_BASED_OUTPATIENT_CLINIC_OR_DEPARTMENT_OTHER): Payer: Self-pay | Admitting: Obstetrics & Gynecology

## 2024-01-18 ENCOUNTER — Other Ambulatory Visit (HOSPITAL_COMMUNITY)
Admission: RE | Admit: 2024-01-18 | Discharge: 2024-01-18 | Disposition: A | Discharge status: Home / Self Care | Source: Ambulatory Visit | Attending: Obstetrics & Gynecology | Admitting: Obstetrics & Gynecology

## 2024-01-18 VITALS — BP 137/60 | HR 75 | Wt 293.2 lb

## 2024-01-18 DIAGNOSIS — L292 Pruritus vulvae: Secondary | ICD-10-CM | POA: Diagnosis present

## 2024-01-18 DIAGNOSIS — B372 Candidiasis of skin and nail: Secondary | ICD-10-CM | POA: Diagnosis not present

## 2024-01-18 DIAGNOSIS — L900 Lichen sclerosus et atrophicus: Secondary | ICD-10-CM | POA: Diagnosis not present

## 2024-01-18 MED ORDER — FLUCONAZOLE 200 MG PO TABS: ORAL_TABLET | ORAL | 0 refills | Status: AC

## 2024-01-18 MED ORDER — NYSTATIN 100000 UNIT/GM EX CREA: 1.0000 | TOPICAL_CREAM | Freq: Two times a day (BID) | CUTANEOUS | 1 refills | Status: DC

## 2024-01-18 NOTE — Progress Notes (Signed)
 "  GYNECOLOGY  VISIT  CC:   Follow-up   HPI: 74 y.o. G0P0 Single White or [name] female here for follow up of lichen sclerosus et atrophicus. Reports ongoing itching.  However, it is now in the inner thighs.  She has been using the topical steroid on this and there is some burning with placement.  Discussed lichen sclerosus is generally a labial and possibly perirectal finding and not in the inner thighs.  She reports the area where the original lichen sclerosus was present is under good control.  Not really needing steroid on that skin.  Patient's last menstrual period was 01/12/2003.  Past Medical History:  Diagnosis Date   Anemia    oral iron occ.   Arthritis    Basal cell carcinoma of nose    Bronchitis    Bursitis of left hip    Cancer (HCC)    Phreesia 12/19/2019   Cancer of ovary (HCC) 2005   Stage 1A, BRCA 1/2 neg 6/14   Carpal tunnel syndrome    Right hand   Cataract    Phreesia 12/19/2019   Degenerative joint disease of both hips 2019   Diverticulitis    past history   DVT (deep vein thrombosis) in pregnancy    LEFT LEG   Family history of breast cancer    Femoral DVT (deep venous thrombosis) (HCC) 2008   -wears compression hose all times left leg   FH: genetic disease carrier 11/05/2016   Genetic testing 11/04/2016   Multi-Cancer panel (83 genes) @ [facility] - see report; special interpretation   Glaucoma    Phreesia 12/19/2019   History of ovarian cancer    HTN (hypertension)    Hyperlipidemia    Hypertension    Phreesia 12/19/2019   OSA (obstructive sleep apnea)    hypoventilation, on CPAP   Osteopenia 2019   Pneumonia    Shingles    Sleep apnea    Phreesia 12/19/2019    MEDS:  Reviewed in EPIC  ALLERGIES: Valtrex  [valacyclovir  hcl], Benoxinate base, Sulfamethoxazole-trimethoprim, Iodinated contrast media, Sulfa antibiotics, and Valacyclovir   SH:  single, non smoker  Review of Systems  Constitutional: Negative.   Genitourinary:         Inner thigh itching    PHYSICAL EXAMINATION:    BP 137/60 (BP Location: Right Arm, Patient Position: Sitting, Cuff Size: Large)   Pulse 75   Wt 293 lb 3.2 oz (133 kg)   LMP 01/12/2003   SpO2 100%   BMI 48.05 kg/m     General appearance: alert, cooperative and appears stated age Lymph:  no inguinal LAD noted Skin:  inner thigh erythema with some skin peeling  Pelvic: External genitalia:  no lesions, mild area of hypopigmentation on perineal body and down to rectum but much improved.  No ulcerations.                Urethra:  normal appearing urethra with no masses, tenderness or lesions              Bartholins and Skenes: normal     Assessment/Plan: 1. Candidal skin infection (Primary) - findings most consistent with skin candida.  Will obtain testing and treat with antifungal.  Plan skin biopsy if no improvement - fluconazole  (DIFLUCAN ) 200 MG tablet; Take 1 tablet every 3 days for 3 doses  Dispense: 3 tablet; Refill: 0 - nystatin  cream (MYCOSTATIN ); Apply 1 Application topically 2 (two) times daily. Apply to affected area BID for up to 7 days.  Dispense: 30 g; Refill: 1  2. Vulvar itching - Cervicovaginal ancillary only( [place])  3. Lichen sclerosus - area of lichen sclerosus much improved from initial visit.  This is not the cause of her current symptoms.  Has rx for mometasone  if needed.     "

## 2024-01-19 LAB — CERVICOVAGINAL ANCILLARY ONLY
Candida Glabrata: NEGATIVE
Candida Vaginitis: NEGATIVE
Comment: NEGATIVE
Comment: NEGATIVE

## 2024-01-20 ENCOUNTER — Telehealth (HOSPITAL_BASED_OUTPATIENT_CLINIC_OR_DEPARTMENT_OTHER): Payer: Self-pay

## 2024-01-20 NOTE — Telephone Encounter (Signed)
 Patient left message on nurses' line stating that she saw Dr. Cleotilde on 01/18/2024 and could not remember her instructions for how to take/use the medications that were prescribed to her.   Morna LOISE Quale, RN

## 2024-01-21 ENCOUNTER — Ambulatory Visit (HOSPITAL_BASED_OUTPATIENT_CLINIC_OR_DEPARTMENT_OTHER): Payer: Self-pay | Admitting: Obstetrics & Gynecology

## 2024-01-31 ENCOUNTER — Ambulatory Visit (HOSPITAL_BASED_OUTPATIENT_CLINIC_OR_DEPARTMENT_OTHER): Admitting: Obstetrics & Gynecology

## 2024-02-01 ENCOUNTER — Other Ambulatory Visit (HOSPITAL_BASED_OUTPATIENT_CLINIC_OR_DEPARTMENT_OTHER): Payer: Self-pay

## 2024-02-01 ENCOUNTER — Telehealth (HOSPITAL_BASED_OUTPATIENT_CLINIC_OR_DEPARTMENT_OTHER): Payer: Self-pay

## 2024-02-01 ENCOUNTER — Ambulatory Visit (INDEPENDENT_AMBULATORY_CARE_PROVIDER_SITE_OTHER): Admitting: Neurology

## 2024-02-01 ENCOUNTER — Encounter

## 2024-02-01 DIAGNOSIS — B372 Candidiasis of skin and nail: Secondary | ICD-10-CM

## 2024-02-01 DIAGNOSIS — Z6841 Body Mass Index (BMI) 40.0 and over, adult: Secondary | ICD-10-CM

## 2024-02-01 DIAGNOSIS — G4733 Obstructive sleep apnea (adult) (pediatric): Secondary | ICD-10-CM | POA: Diagnosis not present

## 2024-02-01 DIAGNOSIS — I10 Essential (primary) hypertension: Secondary | ICD-10-CM

## 2024-02-01 DIAGNOSIS — R600 Localized edema: Secondary | ICD-10-CM

## 2024-02-01 DIAGNOSIS — R9431 Abnormal electrocardiogram [ECG] [EKG]: Secondary | ICD-10-CM

## 2024-02-01 MED ORDER — NYSTATIN 100000 UNIT/GM EX CREA: 1.0000 | TOPICAL_CREAM | Freq: Two times a day (BID) | CUTANEOUS | 1 refills | Status: AC

## 2024-02-01 NOTE — Telephone Encounter (Signed)
 Patient cancelled appointment due to upcoming weather threat and was rescheduled for 2/9 would like a refill sent in for her nystatin  cream (MYCOSTATIN ) because she will be out before her next appointment.

## 2024-02-01 NOTE — Telephone Encounter (Signed)
 Spoke with patient and advised that medication refill was sent to patient preferred pharmacy.   Morna LOISE Quale, RN

## 2024-02-03 NOTE — Progress Notes (Signed)
 "           [facility] [place]  [facility] SLEEP TEST REPORT ( by Watch PAT)   STUDY DATE:  02-01-2024/ data load 02-03-2024     ORDERING CLINICIAN: Greig Forbes, NP  REFERRING CLINICIAN: Alm Bilis, MD    CLINICAL INFORMATION/HISTORY: Mrs Creswell is a  current CPAP user, set at 8 cm water , 2 cm EPR with 100% compliance, residual AHI of 2.5/h  low air leak. She is followed at [facility] since 2011, had a split study in 2012. Referred by dr Humberto.  Her risk factors include a high BMI and immobility due to joint and muscle pain. Has been gait impaired for years. History of DVT, ovarian cancer, carpal tunnel,  HTN and hyperlipidemia hip bursitis, left. Will plan to repeat HST and order new machine pending results. She will follow up in 31-90 days following receipt of new machine, sooner if needed. She verbalizes understanding and agreement with this plan   03/09/2023 ALL:  Eevee  A Akre is a 74 y.o. female here today for follow up for OSA on CPAP. She continues to do well on therapy. She is using CPAP nightly for about 7 hours, on average. She denies concerns with machine. She does have a leak but not bothered by this and AHi well managed. She does not feel she can tighten headgear due to lingering marks on her face. She was set up with last machine late 2020.       Epworth sleepiness score: 2/24.   Neck cir: 18 Mallampati: 3 plus ( new : grade 4)      Sleep Summary:   Total Recording Time (hours, min):   6 h 31 minutes     Total Sleep Time (hours, min):  5 h 31 minutes               Percent REM (%):    0 minutes ( chest wall electrode failed)                                      Respiratory Indices:   Calculated pAHI (per  CMS guideline): 12.7/h       PS:  By AASM scoring guidelines, this patient would have moderate OSA at an AHI of 20.5/h.                  REM pAHI:    NA     NREM pAHI:   NA                          Positional AHI:  all sleep in supine.     Snoring:  loud , mean Volume at 45 dB.                                               Oxygen Saturation Statistics:   Oxygen Saturation (%) Mean: 94%    O2 Saturation Range (%):   between 88 and   99%                                   O2 Saturation (minutes) <89%:   0  minutes         Pulse Rate Statistics:   Sleep Pulse Mean (bpm):  61       Pulse Range:  between 33 and 104 bpm ( extreme bradycardia, repeatedly )                IMPRESSION:  This HST confirms the presence of  mild - moderate obstructive sleep apnea without any central component and without hypoxia.  Loud snoring.   Remarkable:  high variability of heart rate with recurrent bradycardia.    RECOMMENDATION: Continuation of CPAP therapy is indicated.  The patient has done very well under 8 cm water  CPAP pressure and may continue at this pressure with the interface of her choice.  Risk factors for OSA are still present ; high BMI and small upper airway.  Weight loss is encouraged and cardiology follow up also encouraged.     Any patient should be cautioned not to drive, work at heights, or operate dangerous or heavy equipment when tired or sleepy.   Review of good sleep hygiene measures is accessible to any sleep clinic patient and can be reiterated through online material- I we recommend the Guide to better Sleep   by the NIH.   Weight loss and Core Strength improvement is highly recommended for individuals with low muscle tone and/ or a BMI over 30.  Any CPAP patient should be reminded to be fully compliant with PAP therapy .   Sleep fragmentation in the presence of normal proportional sleep stages is a nonspecific findings and per se does not signify an intrinsic sleep disorder or a cause for the patient's sleep-related symptoms.  Causes include (but are not limited to) the unfamiliarity of sleeping while recorded by HST device or sleeping in a sleep lab for a full Polysomnography sleep study, but also CPAP  dependence, circadian rhythm disturbances, medication side effects or an underlying mood disorder or medical problem.   The referring physician will be notified of the test results.       INTERPRETING PHYSICIAN:   Dedra Gores, MD  [facility] Sleep Board certified by The [employer] of Sleep Medicine and Diplomate of the [employer] of Sleep Medicine. Board certified In Neurology through the ABPN, Fellow of the [employer] of Neurology.                        "

## 2024-02-06 ENCOUNTER — Ambulatory Visit: Payer: Medicare PPO | Admitting: Family Medicine

## 2024-02-07 ENCOUNTER — Ambulatory Visit (HOSPITAL_BASED_OUTPATIENT_CLINIC_OR_DEPARTMENT_OTHER): Payer: Self-pay | Admitting: Obstetrics & Gynecology

## 2024-02-09 NOTE — Procedures (Signed)
 "       [facility]  [facility] SLEEP TEST REPORT ( by Watch PAT)   STUDY DATE:  02-01-2024/ data load 02-03-2024     ORDERING CLINICIAN: Greig Forbes, NP  REFERRING CLINICIAN: Alm Bilis, MD    CLINICAL INFORMATION/HISTORY: [name] is a  current CPAP user, set at 8 cm water , 2 cm EPR with 100% compliance, residual AHI of 2.5/h  low air leak. She is followed at [facility] since 2011, had a split study in 2012. Referred by dr Humberto.  Her risk factors include a high BMI and immobility due to joint and muscle pain. Has been gait impaired for years. History of DVT, ovarian cancer, carpal tunnel,  HTN and hyperlipidemia hip bursitis, left. Will plan to repeat HST and order new machine pending results. She will follow up in 31-90 days following receipt of new machine, sooner if needed. She verbalizes understanding and agreement with this plan   03/09/2023 ALL:  [name]  A Hand is a 74 y.o. female here today for follow up for OSA on CPAP. She continues to do well on therapy. She is using CPAP nightly for about 7 hours, on average. She denies concerns with machine. She does have a leak but not bothered by this and AHi well managed. She does not feel she can tighten headgear due to lingering marks on her face. She was set up with last machine late 2020.       Epworth sleepiness score: 2/24.   Neck cir: 18 Mallampati: 3 plus ( new : grade 4)      Sleep Summary:   Total Recording Time (hours, min):   6 h 31 minutes     Total Sleep Time (hours, min):  5 h 31 minutes               Percent REM (%):    0 minutes ( chest wall electrode failed)                                      Respiratory Indices:   Calculated pAHI (per  CMS guideline): 12.7/h       PS:  By AASM scoring guidelines, this patient would have moderate OSA at an AHI of 20.5/h.                  REM pAHI:    NA     NREM pAHI:   NA                          Positional AHI:  all sleep in supine.    Snoring:   loud , mean Volume at 45 dB.                                               Oxygen Saturation Statistics:   Oxygen Saturation (%) Mean: 94%    O2 Saturation Range (%):   between 88 and   99%                                   O2 Saturation (minutes) <89%:   0 minutes  Pulse Rate Statistics:   Sleep Pulse Mean (bpm):  61       Pulse Range:  between 33 and 104 bpm ( extreme bradycardia, repeatedly )                IMPRESSION:  This HST confirms the presence of  mild - moderate obstructive sleep apnea without any central component and without hypoxia.  Loud snoring.   Remarkable:  high variability of heart rate with recurrent bradycardia.    RECOMMENDATION: Continuation of CPAP therapy is indicated.  The patient has done very well under 8 cm water  CPAP pressure and may continue at this pressure with the interface of her choice.  Risk factors for OSA are still present ; high BMI and small upper airway.  Weight loss is encouraged and cardiology follow up also encouraged.     Any patient should be cautioned not to drive, work at heights, or operate dangerous or heavy equipment when tired or sleepy.   Review of good sleep hygiene measures is accessible to any sleep clinic patient and can be reiterated through online material- I we recommend the Guide to better Sleep   by the NIH.   Weight loss and Core Strength improvement is highly recommended for individuals with low muscle tone and/ or a BMI over 30.  Any CPAP patient should be reminded to be fully compliant with PAP therapy .   Sleep fragmentation in the presence of normal proportional sleep stages is a nonspecific findings and per se does not signify an intrinsic sleep disorder or a cause for the patient's sleep-related symptoms.  Causes include (but are not limited to) the unfamiliarity of sleeping while recorded by HST device or sleeping in a sleep lab for a full Polysomnography sleep study, but also CPAP dependence, circadian  rhythm disturbances, medication side effects or an underlying mood disorder or medical problem.   The referring physician will be notified of the test results.       INTERPRETING PHYSICIAN:   Dedra Gores, MD  [facility] Sleep Board certified by The [employer] and Diplomate of the [employer]. Board certified In Neurology through the ABPN, Fellow of the [employer] Neu[employer].                     "

## 2024-02-13 ENCOUNTER — Ambulatory Visit: Payer: Self-pay | Admitting: Family Medicine

## 2024-02-20 ENCOUNTER — Ambulatory Visit (HOSPITAL_BASED_OUTPATIENT_CLINIC_OR_DEPARTMENT_OTHER): Admitting: Obstetrics & Gynecology

## 2024-05-18 ENCOUNTER — Ambulatory Visit (HOSPITAL_BASED_OUTPATIENT_CLINIC_OR_DEPARTMENT_OTHER): Admitting: Obstetrics & Gynecology
